# Patient Record
Sex: Female | Born: 1935 | Race: White | Hispanic: No | Smoking: Former smoker
Health system: Southern US, Community
[De-identification: ages and names within clinical notes are randomized; demographics above are authoritative.]

## PROBLEM LIST (undated history)

## (undated) DIAGNOSIS — I10 Essential (primary) hypertension: Secondary | ICD-10-CM

## (undated) DIAGNOSIS — I4719 Other supraventricular tachycardia: Secondary | ICD-10-CM

## (undated) DIAGNOSIS — C50919 Malignant neoplasm of unspecified site of unspecified female breast: Secondary | ICD-10-CM

## (undated) DIAGNOSIS — Z95828 Presence of other vascular implants and grafts: Secondary | ICD-10-CM

## (undated) DIAGNOSIS — I4891 Unspecified atrial fibrillation: Secondary | ICD-10-CM

## (undated) DIAGNOSIS — M810 Age-related osteoporosis without current pathological fracture: Secondary | ICD-10-CM

## (undated) DIAGNOSIS — I2699 Other pulmonary embolism without acute cor pulmonale: Secondary | ICD-10-CM

## (undated) DIAGNOSIS — G4700 Insomnia, unspecified: Secondary | ICD-10-CM

## (undated) DIAGNOSIS — I471 Supraventricular tachycardia: Secondary | ICD-10-CM

## (undated) DIAGNOSIS — T7840XA Allergy, unspecified, initial encounter: Secondary | ICD-10-CM

## (undated) HISTORY — DX: Allergy, unspecified, initial encounter: T78.40XA

## (undated) HISTORY — DX: Other supraventricular tachycardia: I47.19

## (undated) HISTORY — PX: REPLACEMENT TOTAL KNEE: SUR1224

## (undated) HISTORY — DX: Essential (primary) hypertension: I10

## (undated) HISTORY — PX: CHOLECYSTECTOMY: SHX55

## (undated) HISTORY — PX: HERNIA REPAIR: SHX51

## (undated) HISTORY — PX: TONSILLECTOMY: SUR1361

## (undated) HISTORY — DX: Presence of other vascular implants and grafts: Z95.828

## (undated) HISTORY — DX: Other pulmonary embolism without acute cor pulmonale: I26.99

## (undated) HISTORY — DX: Insomnia, unspecified: G47.00

## (undated) HISTORY — DX: Supraventricular tachycardia: I47.1

## (undated) HISTORY — PX: BLADDER SURGERY: SHX569

## (undated) HISTORY — DX: Age-related osteoporosis without current pathological fracture: M81.0

---

## 2007-07-28 DIAGNOSIS — I2699 Other pulmonary embolism without acute cor pulmonale: Secondary | ICD-10-CM

## 2007-07-28 DIAGNOSIS — Z95828 Presence of other vascular implants and grafts: Secondary | ICD-10-CM

## 2007-07-28 HISTORY — DX: Other pulmonary embolism without acute cor pulmonale: I26.99

## 2007-07-28 HISTORY — DX: Presence of other vascular implants and grafts: Z95.828

## 2008-05-19 LAB — HM COLONOSCOPY: HM Colonoscopy: NORMAL

## 2009-07-23 LAB — PROTIME-INR

## 2010-05-13 LAB — PROTIME-INR

## 2011-03-04 ENCOUNTER — Ambulatory Visit: Payer: Self-pay | Admitting: Internal Medicine

## 2011-03-10 LAB — PROTIME-INR

## 2011-03-17 LAB — PROTIME-INR

## 2011-03-19 ENCOUNTER — Observation Stay: Payer: Self-pay | Admitting: Internal Medicine

## 2011-04-01 LAB — PROTIME-INR

## 2011-04-30 LAB — PROTIME-INR

## 2011-05-13 ENCOUNTER — Ambulatory Visit (INDEPENDENT_AMBULATORY_CARE_PROVIDER_SITE_OTHER): Payer: Medicare Other | Admitting: Internal Medicine

## 2011-05-13 ENCOUNTER — Encounter: Payer: Self-pay | Admitting: Internal Medicine

## 2011-05-13 DIAGNOSIS — G4700 Insomnia, unspecified: Secondary | ICD-10-CM

## 2011-05-13 DIAGNOSIS — I10 Essential (primary) hypertension: Secondary | ICD-10-CM

## 2011-05-13 DIAGNOSIS — Z7901 Long term (current) use of anticoagulants: Secondary | ICD-10-CM | POA: Insufficient documentation

## 2011-05-13 LAB — PROTIME-INR
INR: 2.4 ratio — ABNORMAL HIGH (ref 0.8–1.0)
Prothrombin Time: 26.3 s — ABNORMAL HIGH (ref 10.2–12.4)

## 2011-05-13 MED ORDER — TRAZODONE HCL 50 MG PO TABS: 50.0000 mg | ORAL_TABLET | Freq: Every day | ORAL | Status: DC

## 2011-05-13 NOTE — Progress Notes (Addendum)
Subjective:    Patient ID: [name], female    DOB: [birth year], 77 y.o.   MRN: [id number]  HPI 77YO female who presents to establish care. Her primary concern today is insomnia. She has been having trouble both falling asleep and staying asleep for years.  She notes that in the past she used Ambien with resolution of her insomnia. She was however concerned about drowsiness associated with using this medication and she had one fall associated with drowsiness which led to blood clot in her leg. She subsequently stopped her Ambien and has been using Benadryl with minimal improvement. She was recently hospitalized for pneumonia and was started on trazodone by the hospitalist. She has not yet filled this prescription. She again notes difficulty both falling asleep and staying asleep. She reports good sleep hygiene. She has tried limiting caffeine, making sure her sleep space is quiet and comfortable with no improvement.  Chronic insomnia has led to daytime somnolence.  In regards to her history of DVT and pulmonary embolus she reports full compliance with her Coumadin. She brings record of her last INR which was slightly low at 1.6. She reports that she is due for an INR rechecked today. She notes that she typically has her on an are checked at the [facility] where she lives. She denies any bleeding or bruising.  Outpatient Encounter Prescriptions as of 05/13/2011  Medication Sig Dispense Refill  . bisoprolol-hydrochlorothiazide (ZIAC) 5-6.25 MG per tablet Take 1 tablet by mouth daily.        . diphenhydrAMINE (BENADRYL) 25 MG tablet Take 25 mg by mouth every 6 (six) hours as needed.        . montelukast (SINGULAIR) 10 MG tablet Take 10 mg by mouth as needed.        . Pseudoeph-Doxylamine-DM-APAP (NYQUIL PO) Take by mouth. 2 teaspoons prn        . traZODone (DESYREL) 50 MG tablet Take 1 tablet (50 mg total) by mouth at bedtime.  30 tablet  3  . warfarin (COUMADIN) 3 MG tablet Take 3 mg by mouth daily.  1 tablet Monday, Wednesday, Thursday, Friday and Saturday.  11/2 on Tuesday and Sundays         Review of Systems  Constitutional: Negative for fever, chills, appetite change, fatigue and unexpected weight change.  HENT: Negative for ear pain, congestion, sore throat, trouble swallowing, neck pain, voice change and sinus pressure.   Eyes: Negative for visual disturbance.  Respiratory: Negative for cough, shortness of breath, wheezing and stridor.   Cardiovascular: Negative for chest pain, palpitations and leg swelling.  Gastrointestinal: Negative for nausea, vomiting, abdominal pain, diarrhea, constipation, blood in stool, abdominal distention and anal bleeding.  Genitourinary: Negative for dysuria and flank pain.  Musculoskeletal: Negative for myalgias, arthralgias and gait problem.  Skin: Negative for color change and rash.  Neurological: Negative for dizziness and headaches.  Hematological: Negative for adenopathy. Does not bruise/bleed easily.  Psychiatric/Behavioral: Positive for sleep disturbance. Negative for suicidal ideas and dysphoric mood. The patient is not nervous/anxious.    BP 144/90  Pulse 68  Temp(Src) 97.8 F (36.6 C) (Oral)  Resp 12  Ht 5' (1.524 m)  Wt 151 lb (68.493 kg)  BMI 29.49 kg/m2  SpO2 97%     Objective:   Physical Exam  Constitutional: She is oriented to person, place, and time. She appears well-developed and well-nourished. No distress.  HENT:  Head: Normocephalic and atraumatic.  Right Ear: External ear normal.  Left Ear: External ear  normal.  Nose: Nose normal.  Mouth/Throat: Oropharynx is clear and moist. No oropharyngeal exudate.  Eyes: Conjunctivae are normal. Pupils are equal, round, and reactive to light. Right eye exhibits no discharge. Left eye exhibits no discharge. No scleral icterus.  Neck: Normal range of motion. Neck supple. No tracheal deviation present. No thyromegaly present.  Cardiovascular: Normal rate, regular rhythm, normal  heart sounds and intact distal pulses.  Exam reveals no gallop and no friction rub.   No murmur heard. Pulmonary/Chest: Effort normal and breath sounds normal. No respiratory distress. She has no wheezes. She has no rales. She exhibits no tenderness.  Abdominal: Soft. Bowel sounds are normal. She exhibits no distension and no mass. There is no tenderness. There is no rebound and no guarding.  Musculoskeletal: Normal range of motion. She exhibits no edema and no tenderness.  Lymphadenopathy:    She has no cervical adenopathy.  Neurological: She is alert and oriented to person, place, and time. No cranial nerve deficit. She exhibits normal muscle tone. Coordination normal.  Skin: Skin is warm and dry. No rash noted. She is not diaphoretic. No erythema. No pallor.  Psychiatric: She has a normal mood and affect. Her behavior is normal. Judgment and thought content normal.          Assessment & Plan:  1. Insomnia - encouraged her to try trazodone. She already has a prescription for this. She will try this for one month and then return to clinic. She will call sooner if any difficulty with this medication.  2. IVC filter -patient with a history of an IVC filter placed after being found to have extensive DVT and pulmonary emboli. We will request records on her previous treatment. Given that this IVC filter has been in place for 3 years question if it may need to be removed. We will set her up with vascular surgery for ultrasound and evaluation. She will continue on Coumadin. Previous Coumadin level in September 2012 was low. Will check INR today. Goal INR 2-3.  3. Hypertension -  blood pressure slightly elevated today. Patient reports it is typically lower at home. We'll continue to monitor. Will continue current medications. She will followup in one month.

## 2011-05-26 ENCOUNTER — Telehealth: Payer: Self-pay | Admitting: Internal Medicine

## 2011-05-26 NOTE — Telephone Encounter (Signed)
She needs to be seen by the physician [facility] first, then they can decide what imaging is best for her. She has a history of an IVC filter and I just question if it can be left in or if it needs removed.

## 2011-05-26 NOTE — Telephone Encounter (Signed)
I called and advised patient of what Dr. Dan Humphreys sent back to me.  She said that she will go tomorrow afternoon she understood what Dr. Dan Humphreys was saying she just thought that Dr. Dan Humphreys had ordered the ultra sound.

## 2011-05-26 NOTE — Telephone Encounter (Signed)
patient thought she was suppose to have an ultra sound at [facility] they state they need an order she is scheduled on 05/27/11 at 2:45.  They told her that she was only going to see a doctor there at [facility], and no ultra sound scheduled.  She states she will be out of her apartment for a few hours we can leave message on machine.

## 2011-06-08 ENCOUNTER — Other Ambulatory Visit: Payer: Self-pay | Admitting: *Deleted

## 2011-06-08 MED ORDER — BISOPROLOL-HYDROCHLOROTHIAZIDE 5-6.25 MG PO TABS: 1.0000 | ORAL_TABLET | Freq: Every day | ORAL | Status: DC

## 2011-06-08 MED ORDER — WARFARIN SODIUM 3 MG PO TABS: 3.0000 mg | ORAL_TABLET | Freq: Every day | ORAL | Status: DC

## 2011-06-10 LAB — PROTIME-INR

## 2011-06-11 ENCOUNTER — Telehealth: Payer: Self-pay | Admitting: Internal Medicine

## 2011-06-11 MED ORDER — WARFARIN SODIUM 3 MG PO TABS: 3.0000 mg | ORAL_TABLET | Freq: Every day | ORAL | Status: DC

## 2011-06-11 MED ORDER — BISOPROLOL-HYDROCHLOROTHIAZIDE 5-6.25 MG PO TABS: 1.0000 | ORAL_TABLET | Freq: Every day | ORAL | Status: DC

## 2011-06-11 NOTE — Telephone Encounter (Signed)
This was done on 11/12. Patient informed, Resent RX

## 2011-06-11 NOTE — Telephone Encounter (Signed)
[phone] Pt called she needs refill on meds.  She went to [facility] and they told her we needed to see refill request for [place]  ziac -hctc 5-6.25mg  once daily Coumadin  3mg  tablet  1  tablet 5 days a week   1 1/2 tablet 2 days week    The pt only has 5 days left of this rx  Please let pt know when this has been called in

## 2011-06-12 ENCOUNTER — Telehealth: Payer: Self-pay | Admitting: Internal Medicine

## 2011-06-12 DIAGNOSIS — Z7901 Long term (current) use of anticoagulants: Secondary | ICD-10-CM

## 2011-06-12 NOTE — Telephone Encounter (Signed)
Send the order for a PT/INR to be done in 1 month to [facility] the Nurse AT the [facility] ext. [phone].

## 2011-06-12 NOTE — Telephone Encounter (Signed)
Left message asking patient to return my call.

## 2011-06-12 NOTE — Telephone Encounter (Signed)
INR was perfect. Repeat INR 1 month.

## 2011-06-15 ENCOUNTER — Telehealth: Payer: Self-pay | Admitting: Internal Medicine

## 2011-06-15 NOTE — Telephone Encounter (Signed)
Called pt to let her know that we have her cd  From [facility] is here for her to pick up. Pt will pick this up at her next appointment

## 2011-06-15 NOTE — Telephone Encounter (Signed)
[facility] [phone]

## 2011-06-29 ENCOUNTER — Ambulatory Visit (INDEPENDENT_AMBULATORY_CARE_PROVIDER_SITE_OTHER): Payer: Medicare Other | Admitting: Internal Medicine

## 2011-06-29 ENCOUNTER — Encounter: Payer: Self-pay | Admitting: Internal Medicine

## 2011-06-29 DIAGNOSIS — G4700 Insomnia, unspecified: Secondary | ICD-10-CM

## 2011-06-29 DIAGNOSIS — J309 Allergic rhinitis, unspecified: Secondary | ICD-10-CM

## 2011-06-29 MED ORDER — TRAZODONE HCL 50 MG PO TABS: 50.0000 mg | ORAL_TABLET | Freq: Every day | ORAL | Status: DC

## 2011-06-29 MED ORDER — FLUTICASONE PROPIONATE 50 MCG/ACT NA SUSP: 1.0000 | Freq: Every day | NASAL | Status: DC

## 2011-06-29 MED ORDER — MONTELUKAST SODIUM 10 MG PO TABS: 10.0000 mg | ORAL_TABLET | ORAL | Status: DC | PRN

## 2011-06-29 NOTE — Progress Notes (Signed)
Subjective:    Patient ID: [name], female    DOB: Dec 15, 1925, 77 y.o.   MRN: [id number]  HPI 77 year old female with a history of hypertension presents for followup. At her last visit, she discussed a concern of chronic insomnia. She had previously been treated with Ambien with good control of her symptoms. However, she had several episodes while taking Ambien where she became confused. She stopped taking his medication several months ago. Her previous primary care physician wrote her prescription for tramadol but she did not start this medication. She reports that she is typically going to sleep around 12:30 or 1 AM. She sleeps for 2-4 hours at a maximum and then is up again. She denies any anxiety or depression playing a role in her insomnia. She is interested in trying another medication but is concerned about the potential for addiction. She has used Benadryl in the past with no improvement.  She is also concerned today about recent episodes of sneezing and runny nose. She first noticed this when she moved into [place]. She has taken over-the-counter antihistamines with no improvement. She denies any fever or chills. She denies any sinus pressure. She reports her nasal drainage is clear. She denies any cough or shortness of breath.  Outpatient Encounter Prescriptions as of 06/29/2011  Medication Sig Dispense Refill  . bisoprolol-hydrochlorothiazide (ZIAC) 5-6.25 MG per tablet Take 1 tablet by mouth daily.  90 tablet  1  . diphenhydrAMINE (BENADRYL) 25 MG tablet Take 25 mg by mouth every 6 (six) hours as needed.        . montelukast (SINGULAIR) 10 MG tablet Take 1 tablet (10 mg total) by mouth as needed.  90 tablet  1  . Pseudoeph-Doxylamine-DM-APAP (NYQUIL PO) Take by mouth. 2 teaspoons prn        . warfarin (COUMADIN) 3 MG tablet Take 1 tablet (3 mg total) by mouth daily. 1 tablet Monday, Wednesday, Thursday, Friday and Saturday.  11/2 on Tuesday and Sundays  100 tablet  2    Review  of Systems  Constitutional: Negative for fever, chills, appetite change, fatigue and unexpected weight change.  HENT: Negative for ear pain, congestion, sore throat, neck pain and sinus pressure.   Eyes: Negative for visual disturbance.  Respiratory: Negative for cough, shortness of breath, wheezing and stridor.   Cardiovascular: Positive for leg swelling (left upper foot). Negative for chest pain and palpitations.  Gastrointestinal: Negative for abdominal pain.  Genitourinary: Negative for dysuria and flank pain.  Musculoskeletal: Negative for myalgias, arthralgias and gait problem.  Skin: Negative for color change and rash.  Neurological: Negative for dizziness and headaches.  Hematological: Negative for adenopathy. Does not bruise/bleed easily.  Psychiatric/Behavioral: Positive for sleep disturbance. Negative for suicidal ideas and dysphoric mood. The patient is not nervous/anxious.    BP 140/60  Pulse 70  Temp(Src) 98 F (36.7 C) (Oral)  Wt 153 lb (69.4 kg)  SpO2 97%     Objective:   Physical Exam  Constitutional: She is oriented to person, place, and time. She appears well-developed and well-nourished. No distress.  HENT:  Head: Normocephalic and atraumatic.  Right Ear: External ear normal.  Left Ear: External ear normal.  Nose: Nose normal.  Mouth/Throat: Oropharynx is clear and moist. No oropharyngeal exudate.  Eyes: Conjunctivae are normal. Pupils are equal, round, and reactive to light. Right eye exhibits no discharge. Left eye exhibits no discharge. No scleral icterus.  Neck: Normal range of motion. Neck supple. No tracheal deviation present. No thyromegaly present.  Cardiovascular: Normal rate, regular rhythm, normal heart sounds and intact distal pulses.  Exam reveals no gallop and no friction rub.   No murmur heard. Pulmonary/Chest: Effort normal and breath sounds normal. No respiratory distress. She has no wheezes. She has no rales. She exhibits no tenderness.    Musculoskeletal: Normal range of motion. She exhibits no edema and no tenderness.  Lymphadenopathy:    She has no cervical adenopathy.  Neurological: She is alert and oriented to person, place, and time. No cranial nerve deficit. She exhibits normal muscle tone. Coordination normal.  Skin: Skin is warm and dry. No rash noted. She is not diaphoretic. No erythema. No pallor.  Psychiatric: She has a normal mood and affect. Her behavior is normal. Judgment and thought content normal.          Assessment & Plan:  1. Insomnia -will try a course of tramadol starting with 50 mg at bedtime. Encouraged good sleep hygiene, including avoiding caffeinated beverages or foods. Encouraged her to remain active during the day getting as much physical activity as possible.She will followup in one month.  2. Allergic rhinitis - will try a course of nasal steroids. She will continue to use over-the-counter nonsedating antihistamines. She will followup in one month.

## 2011-07-07 ENCOUNTER — Telehealth: Payer: Self-pay | Admitting: *Deleted

## 2011-07-07 NOTE — Telephone Encounter (Signed)
Insurance requires PA on singulair - pharm faxed info which states that patient's must try and fail inhaled corticosteroid OR combination inhaled corticosteroid long acting beta agonist first. Do you want to proceed with PA on singulair?

## 2011-07-07 NOTE — Telephone Encounter (Signed)
We should try inhaled Advair 100/50 first.

## 2011-07-08 MED ORDER — FLUTICASONE-SALMETEROL 100-50 MCG/DOSE IN AEPB: 1.0000 | INHALATION_SPRAY | Freq: Two times a day (BID) | RESPIRATORY_TRACT | Status: DC

## 2011-07-08 NOTE — Telephone Encounter (Signed)
Patient returned call and was notified. Rx for advair sent to pharmacy .

## 2011-07-08 NOTE — Telephone Encounter (Signed)
Left mess to call office back.   

## 2011-07-09 LAB — PROTIME-INR

## 2011-07-15 ENCOUNTER — Telehealth: Payer: Self-pay | Admitting: Internal Medicine

## 2011-07-15 NOTE — Telephone Encounter (Signed)
Patient notified

## 2011-07-15 NOTE — Telephone Encounter (Signed)
INR perfect. Repeat 1 month.

## 2011-07-28 HISTORY — PX: BREAST SURGERY: SHX581

## 2011-07-28 HISTORY — PX: MASTECTOMY: SHX3

## 2011-08-06 DIAGNOSIS — I2699 Other pulmonary embolism without acute cor pulmonale: Secondary | ICD-10-CM | POA: Diagnosis not present

## 2011-08-06 LAB — PROTIME-INR

## 2011-09-10 DIAGNOSIS — I2699 Other pulmonary embolism without acute cor pulmonale: Secondary | ICD-10-CM | POA: Diagnosis not present

## 2011-09-10 LAB — PROTIME-INR

## 2011-09-11 ENCOUNTER — Telehealth: Payer: Self-pay | Admitting: Internal Medicine

## 2011-09-11 NOTE — Telephone Encounter (Signed)
Called and spoke with Boyd Kerbs, [employer] fund[employer]. INR was perfect at 2.5, repeat in 1 month.

## 2011-09-28 ENCOUNTER — Ambulatory Visit: Payer: Medicare Other | Admitting: Internal Medicine

## 2011-10-07 DIAGNOSIS — I2699 Other pulmonary embolism without acute cor pulmonale: Secondary | ICD-10-CM | POA: Diagnosis not present

## 2011-10-07 LAB — PROTIME-INR

## 2011-10-09 ENCOUNTER — Telehealth: Payer: Self-pay | Admitting: Internal Medicine

## 2011-10-09 NOTE — Telephone Encounter (Signed)
INR perfect at 2.5. Repeat 1 month.

## 2011-10-13 ENCOUNTER — Encounter: Payer: Self-pay | Admitting: Internal Medicine

## 2011-10-13 NOTE — Telephone Encounter (Signed)
Patient notified

## 2011-11-09 DIAGNOSIS — I2699 Other pulmonary embolism without acute cor pulmonale: Secondary | ICD-10-CM | POA: Diagnosis not present

## 2011-11-09 LAB — PROTIME-INR

## 2011-11-10 ENCOUNTER — Telehealth: Payer: Self-pay | Admitting: Internal Medicine

## 2011-11-10 NOTE — Telephone Encounter (Signed)
Left detailed VM for pt.

## 2011-11-10 NOTE — Telephone Encounter (Signed)
INR was perfect. Repeat in 1 month.

## 2011-11-18 ENCOUNTER — Encounter: Payer: Self-pay | Admitting: Internal Medicine

## 2011-11-27 ENCOUNTER — Telehealth: Payer: Self-pay | Admitting: Internal Medicine

## 2011-11-27 DIAGNOSIS — R197 Diarrhea, unspecified: Secondary | ICD-10-CM

## 2011-11-27 MED ORDER — DIPHENOXYLATE-ATROPINE 2.5-0.025 MG PO TABS: 1.0000 | ORAL_TABLET | Freq: Four times a day (QID) | ORAL | Status: AC | PRN

## 2011-11-27 NOTE — Telephone Encounter (Signed)
[name] 11/27/2011 8:15 AM Signed  Patient feels that she her coumadin is causing her diarrhea and she would like to know if you can call her in some Lamotil she has taken this in the past. Please call patient and advise.  Please advise on Lomotil request/SLS

## 2011-11-27 NOTE — Telephone Encounter (Signed)
Done

## 2011-11-27 NOTE — Telephone Encounter (Signed)
ORder entered for phone in.  If symptoms persist, then needs to be seen.

## 2011-11-27 NOTE — Telephone Encounter (Signed)
Patient informed/SLS  

## 2011-11-27 NOTE — Telephone Encounter (Signed)
Patient feels that she her coumadin is causing her diarrhea and she would like to know if you can call her in some Lamotil she has taken this in the past.  Please call patient and advise.

## 2011-12-01 DIAGNOSIS — IMO0002 Reserved for concepts with insufficient information to code with codable children: Secondary | ICD-10-CM | POA: Diagnosis not present

## 2011-12-01 DIAGNOSIS — N390 Urinary tract infection, site not specified: Secondary | ICD-10-CM | POA: Diagnosis not present

## 2011-12-03 ENCOUNTER — Encounter: Payer: Self-pay | Admitting: Internal Medicine

## 2011-12-03 ENCOUNTER — Ambulatory Visit (INDEPENDENT_AMBULATORY_CARE_PROVIDER_SITE_OTHER): Payer: Medicare Other | Admitting: Internal Medicine

## 2011-12-03 VITALS — BP 128/82 | HR 74 | Temp 98.0°F | Ht 60.0 in | Wt 154.5 lb

## 2011-12-03 DIAGNOSIS — L989 Disorder of the skin and subcutaneous tissue, unspecified: Secondary | ICD-10-CM | POA: Insufficient documentation

## 2011-12-03 DIAGNOSIS — Z7901 Long term (current) use of anticoagulants: Secondary | ICD-10-CM

## 2011-12-03 DIAGNOSIS — R197 Diarrhea, unspecified: Secondary | ICD-10-CM | POA: Diagnosis not present

## 2011-12-03 DIAGNOSIS — N390 Urinary tract infection, site not specified: Secondary | ICD-10-CM | POA: Diagnosis not present

## 2011-12-03 DIAGNOSIS — I10 Essential (primary) hypertension: Secondary | ICD-10-CM | POA: Diagnosis not present

## 2011-12-03 DIAGNOSIS — Z86711 Personal history of pulmonary embolism: Secondary | ICD-10-CM | POA: Insufficient documentation

## 2011-12-03 LAB — COMPREHENSIVE METABOLIC PANEL
ALT: 16 U/L (ref 0–35)
AST: 20 U/L (ref 0–37)
Albumin: 4 g/dL (ref 3.5–5.2)
Alkaline Phosphatase: 55 U/L (ref 39–117)
BUN: 13 mg/dL (ref 6–23)
CO2: 28 mEq/L (ref 19–32)
Calcium: 9.5 mg/dL (ref 8.4–10.5)
Chloride: 103 mEq/L (ref 96–112)
Creatinine, Ser: 1 mg/dL (ref 0.4–1.2)
GFR: 56.78 mL/min — ABNORMAL LOW (ref >60.00)
Glucose, Bld: 97 mg/dL (ref 70–99)
Potassium: 3.3 mEq/L — ABNORMAL LOW (ref 3.5–5.1)
Sodium: 142 mEq/L (ref 135–145)
Total Bilirubin: 0.7 mg/dL (ref 0.3–1.2)
Total Protein: 7.4 g/dL (ref 6.0–8.3)

## 2011-12-03 LAB — PROTIME-INR
INR: 2.3 ratio — ABNORMAL HIGH (ref 0.8–1.0)
Prothrombin Time: 25.7 s — ABNORMAL HIGH (ref 10.2–12.4)

## 2011-12-03 LAB — POCT URINALYSIS DIPSTICK
Glucose, UA: 100
Leukocytes, UA: NEGATIVE
Nitrite, UA: POSITIVE
Protein, UA: 30
Spec Grav, UA: 1.02
Urobilinogen, UA: 2
pH, UA: 5

## 2011-12-03 MED ORDER — BISOPROLOL-HYDROCHLOROTHIAZIDE 5-6.25 MG PO TABS: 1.0000 | ORAL_TABLET | Freq: Every day | ORAL | Status: DC

## 2011-12-03 MED ORDER — WARFARIN SODIUM 3 MG PO TABS: 3.0000 mg | ORAL_TABLET | Freq: Every day | ORAL | Status: DC

## 2011-12-03 NOTE — Progress Notes (Signed)
Subjective:    Patient ID: [name], female    DOB: 1926/06/16, 78 y.o.   MRN: [id number]  HPI 78 year old female with history of pulmonary embolus status post IVC filter, on chronic anticoagulation with Coumadin, presents for followup. She reports that earlier this week, on Monday evening she developed dysuria and left flank pain. She went to urgent care on Tuesday morning and was diagnosed with urinary tract infection and treated with Cipro. She reports that her symptoms have improved. She has not had any further dysuria, fever, chills. She does continue to have some mild left flank pain. She is concerned about the effect of the Cipro on her Coumadin level. She held the dose of Coumadin last night.  She also reports a couple week history of watery, yellow stools. She is concerned that this may be related to her use of Coumadin. She is concerned that Coumadin may be damaging the inside of her bowels. She denies any blood in her stools. She denies any abdominal pain. Her diarrhea has improved and she reports normal bowel movement today. She denies any fever or chills.  She is also concerned today about a dry area on her skin below her nose. She reports that this was diagnosed as a precancerous lesion by a dermatologist in the past and was burned off. This was several years ago. She reports that this area has been particularly dry recently. She is concerned about damage to her skin from the cryotherapy in the past.  She also reports continued watery drainage from her nose. She reports minimal improvement in her symptoms with the use of Flonase or Benadryl. She attributes this to allergies. She has not had any sinus pressure, headache, ear pain, cough.  Outpatient Encounter Prescriptions as of 12/03/2011  Medication Sig Dispense Refill  . bisoprolol-hydrochlorothiazide (ZIAC) 5-6.25 MG per tablet Take 1 tablet by mouth daily.  90 tablet  1  . ciprofloxacin (CIPRO) 250 MG tablet Take 250 mg by mouth  2 (two) times daily.      . diphenhydrAMINE (BENADRYL) 25 MG tablet Take 25 mg by mouth every 6 (six) hours as needed.        . diphenoxylate-atropine (LOMOTIL) 2.5-0.025 MG per tablet Take 1 tablet by mouth 4 (four) times daily as needed for diarrhea or loose stools.  30 tablet  0  . fluticasone (FLONASE) 50 MCG/ACT nasal spray Place 1 spray into the nose daily.  16 g  2  . Fluticasone-Salmeterol (ADVAIR DISKUS) 100-50 MCG/DOSE AEPB Inhale 1 puff into the lungs 2 (two) times daily.  1 each  0  . Pseudoeph-Doxylamine-DM-APAP (NYQUIL PO) Take by mouth. 2 teaspoons prn        . warfarin (COUMADIN) 3 MG tablet Take 1 tablet (3 mg total) by mouth daily. 1 tablet Monday, Wednesday, Thursday, Friday and Saturday.  11/2 on Tuesday and Sundays  100 tablet  2  . DISCONTD: bisoprolol-hydrochlorothiazide (ZIAC) 5-6.25 MG per tablet Take 1 tablet by mouth daily.  90 tablet  1  . DISCONTD: warfarin (COUMADIN) 3 MG tablet Take 1 tablet (3 mg total) by mouth daily. 1 tablet Monday, Wednesday, Thursday, Friday and Saturday.  11/2 on Tuesday and Sundays  100 tablet  2  . DISCONTD: montelukast (SINGULAIR) 10 MG tablet Take 1 tablet (10 mg total) by mouth as needed.  90 tablet  1    Review of Systems  Constitutional: Negative for fever, chills, appetite change, fatigue and unexpected weight change.  HENT: Positive for rhinorrhea and sneezing.  Negative for ear pain, congestion, sore throat, trouble swallowing, neck pain, voice change and sinus pressure.   Eyes: Negative for visual disturbance.  Respiratory: Negative for cough, shortness of breath, wheezing and stridor.   Cardiovascular: Negative for chest pain, palpitations and leg swelling.  Gastrointestinal: Positive for diarrhea. Negative for nausea, vomiting, abdominal pain, constipation, blood in stool, abdominal distention and anal bleeding.  Genitourinary: Positive for dysuria, urgency, frequency and flank pain.  Musculoskeletal: Negative for myalgias,  arthralgias and gait problem.  Skin: Positive for rash. Negative for color change.  Neurological: Negative for dizziness and headaches.  Hematological: Negative for adenopathy. Does not bruise/bleed easily.  Psychiatric/Behavioral: Negative for suicidal ideas, sleep disturbance and dysphoric mood. The patient is not nervous/anxious.    BP 128/82  Pulse 74  Temp(Src) 98 F (36.7 C) (Oral)  Ht 5' (1.524 m)  Wt 154 lb 8 oz (70.081 kg)  BMI 30.17 kg/m2     Objective:   Physical Exam  Constitutional: She is oriented to person, place, and time. She appears well-developed and well-nourished. No distress.  HENT:  Head: Normocephalic and atraumatic.  Right Ear: External ear normal.  Left Ear: External ear normal.  Nose: Nose normal.  Mouth/Throat: Oropharynx is clear and moist. No oropharyngeal exudate.  Eyes: Conjunctivae are normal. Pupils are equal, round, and reactive to light. Right eye exhibits no discharge. Left eye exhibits no discharge. No scleral icterus.  Neck: Normal range of motion. Neck supple. No tracheal deviation present. No thyromegaly present.  Cardiovascular: Normal rate, regular rhythm, normal heart sounds and intact distal pulses.  Exam reveals no gallop and no friction rub.   No murmur heard. Pulmonary/Chest: Effort normal and breath sounds normal. No respiratory distress. She has no wheezes. She has no rales. She exhibits no tenderness.  Musculoskeletal: Normal range of motion. She exhibits no edema and no tenderness.  Lymphadenopathy:    She has no cervical adenopathy.  Neurological: She is alert and oriented to person, place, and time. No cranial nerve deficit. She exhibits normal muscle tone. Coordination normal.  Skin: Skin is warm and dry. Rash (Dry, scaling area just below right nare) noted. She is not diaphoretic. No erythema. No pallor.  Psychiatric: She has a normal mood and affect. Her behavior is normal. Judgment and thought content normal.            Assessment & Plan:

## 2011-12-03 NOTE — Assessment & Plan Note (Signed)
Patient has been treated for urinary tract infection with Cipro. Will recent urine culture from our clinic today. We'll try to obtain urine culture which was performed on Tuesday at urgent care. If symptoms of left flank pain persists, would favor getting CT of the abdomen to look for nephrolithiasis.

## 2011-12-03 NOTE — Assessment & Plan Note (Signed)
Symptoms seem to have improved. Suspect related to viral infection. Will continue to monitor. If symptoms recur, would favor getting stool culture.

## 2011-12-03 NOTE — Assessment & Plan Note (Signed)
Will check INR with labs today. Goal INR between 2 and 3. 

## 2011-12-03 NOTE — Assessment & Plan Note (Signed)
Dry area below right nare appears to be irritation from chronic nasal drainage. Encouraged patient to apply moisturizing cream in this area. Offered referral to dermatology for further evaluation. Patient would like to look into this at a later date.

## 2011-12-05 LAB — URINE CULTURE
Colony Count: NO GROWTH
Organism ID, Bacteria: NO GROWTH

## 2011-12-10 ENCOUNTER — Ambulatory Visit: Payer: Medicare Other | Admitting: Internal Medicine

## 2011-12-15 ENCOUNTER — Telehealth: Payer: Self-pay | Admitting: Internal Medicine

## 2011-12-15 NOTE — Telephone Encounter (Signed)
Patient has been taking antibiotics and she is having burning and itching in her vaginal area and would like something for this. Leave a message for the patient after it is called in.

## 2011-12-15 NOTE — Telephone Encounter (Signed)
May call in Diflucan 150mg  po x 1

## 2011-12-16 MED ORDER — FLUCONAZOLE 150 MG PO TABS: 150.0000 mg | ORAL_TABLET | Freq: Once | ORAL | Status: AC

## 2011-12-16 NOTE — Telephone Encounter (Signed)
Done; patient informed/SLS

## 2012-01-01 DIAGNOSIS — I2699 Other pulmonary embolism without acute cor pulmonale: Secondary | ICD-10-CM | POA: Diagnosis not present

## 2012-01-01 LAB — PROTIME-INR

## 2012-01-04 ENCOUNTER — Telehealth: Payer: Self-pay | Admitting: Internal Medicine

## 2012-01-04 DIAGNOSIS — I872 Venous insufficiency (chronic) (peripheral): Secondary | ICD-10-CM | POA: Diagnosis not present

## 2012-01-04 DIAGNOSIS — M1990 Unspecified osteoarthritis, unspecified site: Secondary | ICD-10-CM | POA: Diagnosis not present

## 2012-01-04 DIAGNOSIS — M79609 Pain in unspecified limb: Secondary | ICD-10-CM | POA: Diagnosis not present

## 2012-01-04 DIAGNOSIS — M7989 Other specified soft tissue disorders: Secondary | ICD-10-CM | POA: Diagnosis not present

## 2012-01-04 NOTE — Telephone Encounter (Signed)
Patient notified. She was recently on Diflucan. Also patient will decrease to 2.5 daily and will repeat in 1 week. She has an appt on Friday to see Dr. Dan Humphreys and will have pt/inr checked then.

## 2012-01-04 NOTE — Telephone Encounter (Signed)
Coumadin level was high at 3.9. Has patient been taking any new medications, esp antibiotics? Please have her hold coumadin tonight. If current dose is 3mg  (as listed in chart) will have her decrease to 2.5mg  daily. Repeat INR 1 week.

## 2012-01-08 ENCOUNTER — Ambulatory Visit (INDEPENDENT_AMBULATORY_CARE_PROVIDER_SITE_OTHER): Payer: Medicare Other | Admitting: Internal Medicine

## 2012-01-08 ENCOUNTER — Encounter: Payer: Self-pay | Admitting: Internal Medicine

## 2012-01-08 VITALS — BP 140/90 | HR 66 | Temp 98.7°F | Ht 61.0 in | Wt 156.2 lb

## 2012-01-08 DIAGNOSIS — Z7901 Long term (current) use of anticoagulants: Secondary | ICD-10-CM | POA: Diagnosis not present

## 2012-01-08 DIAGNOSIS — G4700 Insomnia, unspecified: Secondary | ICD-10-CM

## 2012-01-08 DIAGNOSIS — F329 Major depressive disorder, single episode, unspecified: Secondary | ICD-10-CM | POA: Diagnosis not present

## 2012-01-08 DIAGNOSIS — K529 Noninfective gastroenteritis and colitis, unspecified: Secondary | ICD-10-CM | POA: Insufficient documentation

## 2012-01-08 DIAGNOSIS — R197 Diarrhea, unspecified: Secondary | ICD-10-CM | POA: Diagnosis not present

## 2012-01-08 LAB — PROTIME-INR
INR: 2.5 ratio — ABNORMAL HIGH (ref 0.8–1.0)
Prothrombin Time: 27.5 s — ABNORMAL HIGH (ref 10.2–12.4)

## 2012-01-08 MED ORDER — WARFARIN SODIUM 2.5 MG PO TABS: 2.5000 mg | ORAL_TABLET | Freq: Every day | ORAL | Status: DC

## 2012-01-08 MED ORDER — DIPHENOXYLATE-ATROPINE 2.5-0.025 MG PO TABS: 1.0000 | ORAL_TABLET | Freq: Four times a day (QID) | ORAL | Status: AC | PRN

## 2012-01-08 MED ORDER — TRAZODONE HCL 50 MG PO TABS: 25.0000 mg | ORAL_TABLET | Freq: Every evening | ORAL | Status: DC | PRN

## 2012-01-08 NOTE — Assessment & Plan Note (Signed)
Recent INR was elevated at 3.9. INR 2-3. Will repeat INR today.

## 2012-01-08 NOTE — Progress Notes (Signed)
Subjective:    Patient ID: [name], female    DOB: [birth year], 78 y.o.   MRN: [id number]  HPI 78 year old female with history of pulmonary embolus status post IVC filter on chronic anticoagulation presents for followup. Note that recent INR was elevated at 3.9. This in the setting of use of Diflucan. We'll plan to repeat INR today. She has not had any bleeding or bruising.  She is concerned today about persistent insomnia. She reports that she typically does not fall asleep until after 2 AM and then wakes at 7:30. She is not active during the day. In the past, she used Ambien to help with sleep with improvement, but she would prefer to avoid this medication because of drowsiness after waking.  She is also concerned today about depression. She notes that she should have "never moved into assisted living "and wishes that she had stayed in her home. She describes frustration that seeing a residence in her facility using canes or walkers. This reminds her of her own mortality. She reports feeling little support from her family.  Outpatient Encounter Prescriptions as of 01/08/2012  Medication Sig Dispense Refill  . bisoprolol-hydrochlorothiazide (ZIAC) 5-6.25 MG per tablet Take 1 tablet by mouth daily.  90 tablet  1  . ciprofloxacin (CIPRO) 250 MG tablet Take 250 mg by mouth 2 (two) times daily.      . diphenhydrAMINE (BENADRYL) 25 MG tablet Take 25 mg by mouth every 6 (six) hours as needed.        . fluticasone (FLONASE) 50 MCG/ACT nasal spray Place 1 spray into the nose daily.  16 g  2  . Fluticasone-Salmeterol (ADVAIR DISKUS) 100-50 MCG/DOSE AEPB Inhale 1 puff into the lungs 2 (two) times daily.  1 each  0  . Pseudoeph-Doxylamine-DM-APAP (NYQUIL PO) Take by mouth. 2 teaspoons prn        . DISCONTD: warfarin (COUMADIN) 3 MG tablet Take 1 tablet (3 mg total) by mouth daily. 1 tablet Monday, Wednesday, Thursday, Friday and Saturday.  11/2 on Tuesday and Sundays  100 tablet  2  . DISCONTD:  warfarin (COUMADIN) 3 MG tablet Take 2.5 mg by mouth daily.      . diphenoxylate-atropine (LOMOTIL) 2.5-0.025 MG per tablet Take 1 tablet by mouth 4 (four) times daily as needed for diarrhea or loose stools.  30 tablet  5  . traZODone (DESYREL) 50 MG tablet Take 0.5-1 tablets (25-50 mg total) by mouth at bedtime as needed for sleep.  30 tablet  3  . warfarin (COUMADIN) 2.5 MG tablet Take 1 tablet (2.5 mg total) by mouth daily.  30 tablet  3    Review of Systems  Constitutional: Negative for fever, chills, appetite change, fatigue and unexpected weight change.  HENT: Negative for neck pain.   Eyes: Negative for visual disturbance.  Respiratory: Negative for cough, shortness of breath, wheezing and stridor.   Cardiovascular: Negative for chest pain, palpitations and leg swelling.  Gastrointestinal: Negative for nausea, vomiting, abdominal pain, diarrhea, constipation, blood in stool, abdominal distention and anal bleeding.  Genitourinary: Negative for dysuria and flank pain.  Musculoskeletal: Positive for myalgias, joint swelling and arthralgias. Negative for gait problem.  Skin: Negative for color change and rash.  Neurological: Negative for dizziness and headaches.  Hematological: Negative for adenopathy. Does not bruise/bleed easily.  Psychiatric/Behavioral: Positive for disturbed wake/sleep cycle and dysphoric mood. Negative for suicidal ideas. The patient is not nervous/anxious.    BP 140/90  Pulse 66  Temp 98.7 F (37.1 C) (  Oral)  Ht 5\' 1"  (1.549 m)  Wt 156 lb 4 oz (70.875 kg)  BMI 29.52 kg/m2  SpO2 98%     Objective:   Physical Exam  Constitutional: She is oriented to person, place, and time. She appears well-developed and well-nourished. No distress.  HENT:  Head: Normocephalic and atraumatic.  Right Ear: External ear normal.  Left Ear: External ear normal.  Nose: Nose normal.  Mouth/Throat: Oropharynx is clear and moist. No oropharyngeal exudate.  Eyes: Conjunctivae are  normal. Pupils are equal, round, and reactive to light. Right eye exhibits no discharge. Left eye exhibits no discharge. No scleral icterus.  Neck: Normal range of motion. Neck supple. No tracheal deviation present. No thyromegaly present.  Cardiovascular: Normal rate, regular rhythm, normal heart sounds and intact distal pulses.  Exam reveals no gallop and no friction rub.   No murmur heard. Pulmonary/Chest: Effort normal and breath sounds normal. No respiratory distress. She has no wheezes. She has no rales. She exhibits no tenderness.  Musculoskeletal: Normal range of motion. She exhibits no edema and no tenderness.  Lymphadenopathy:    She has no cervical adenopathy.  Neurological: She is alert and oriented to person, place, and time. No cranial nerve deficit. She exhibits normal muscle tone. Coordination normal.  Skin: Skin is warm and dry. No rash noted. She is not diaphoretic. No erythema. No pallor.  Psychiatric: Her speech is normal and behavior is normal. Judgment and thought content normal. Cognition and memory are normal. She exhibits a depressed mood.          Assessment & Plan:

## 2012-01-08 NOTE — Assessment & Plan Note (Signed)
Significant insomnia. Has used Ambien in the past but prefers to avoid this medication. Will try trazodone 50 mg daily. Followup one month.

## 2012-01-08 NOTE — Assessment & Plan Note (Signed)
Symptoms currently controlled with Lomotil. Will continue.

## 2012-01-08 NOTE — Assessment & Plan Note (Signed)
Symptoms recently worsening. Offered support today. Patient is not interested in counseling. She is not interested in starting medications for depression. However, she is willing to start trazodone to help with sleep at night. She will followup in one month.

## 2012-01-18 DIAGNOSIS — I2699 Other pulmonary embolism without acute cor pulmonale: Secondary | ICD-10-CM | POA: Diagnosis not present

## 2012-01-18 LAB — PROTIME-INR

## 2012-01-19 ENCOUNTER — Telehealth: Payer: Self-pay | Admitting: Internal Medicine

## 2012-01-19 ENCOUNTER — Encounter: Payer: Self-pay | Admitting: Internal Medicine

## 2012-01-19 NOTE — Telephone Encounter (Signed)
Patient advised as instructed via telephone.  She will repeat again in one week.

## 2012-01-19 NOTE — Telephone Encounter (Signed)
INR low at 1.7. Can you have her increase coumadin to 3mg  daily and repeat INR 1 week

## 2012-01-26 ENCOUNTER — Telehealth: Payer: Self-pay | Admitting: Internal Medicine

## 2012-01-26 NOTE — Telephone Encounter (Signed)
PT/INR results from 01/18/2012 given to Dr. Dan Humphreys for review.

## 2012-01-26 NOTE — Telephone Encounter (Signed)
Patient advised as instructed via telephone, patient wanted to know what dose to be on of Coumadin, advised patient that we didn't have a copy of labs in her chart.  Called [place] at the [facility] [place] and she will fax a copy of labs to our office.

## 2012-01-26 NOTE — Telephone Encounter (Signed)
Please have her increase trazodone to 100mg  at bedtime (previous dose was 50mg  at bedtime).

## 2012-01-26 NOTE — Telephone Encounter (Signed)
Ms rhinehart called  The last time she saw dr walker in June  Dr walker put her on trazone to help her sleep.  Pt stated this is not helping her at all.  She stated it was like taking an aspirn. She is not even getting sleepy with taking this sleep. Please advise pt what she needs to do.  She stated she has been taking zquil over the counter this is not helping  [employer]  Pt would like her last coumdiam  Results she had this done last week.  She would like to know what dosage of meds she needs to take

## 2012-01-26 NOTE — Telephone Encounter (Signed)
INR slightly low at 1.7. Please have her increase back to coumadin 3mg  daily and repeat INR 2 weeks.

## 2012-01-27 NOTE — Telephone Encounter (Signed)
Patient advised as instructed via telephone. 

## 2012-02-02 ENCOUNTER — Encounter: Payer: Self-pay | Admitting: Internal Medicine

## 2012-02-03 ENCOUNTER — Encounter: Payer: Self-pay | Admitting: Internal Medicine

## 2012-02-08 ENCOUNTER — Ambulatory Visit (INDEPENDENT_AMBULATORY_CARE_PROVIDER_SITE_OTHER): Payer: Medicare Other | Admitting: Internal Medicine

## 2012-02-08 ENCOUNTER — Encounter: Payer: Self-pay | Admitting: Internal Medicine

## 2012-02-08 VITALS — BP 110/80 | HR 60 | Temp 97.9°F | Ht 61.0 in | Wt 155.2 lb

## 2012-02-08 DIAGNOSIS — Z7901 Long term (current) use of anticoagulants: Secondary | ICD-10-CM

## 2012-02-08 DIAGNOSIS — R197 Diarrhea, unspecified: Secondary | ICD-10-CM

## 2012-02-08 DIAGNOSIS — G4700 Insomnia, unspecified: Secondary | ICD-10-CM | POA: Diagnosis not present

## 2012-02-08 DIAGNOSIS — F329 Major depressive disorder, single episode, unspecified: Secondary | ICD-10-CM | POA: Diagnosis not present

## 2012-02-08 DIAGNOSIS — K529 Noninfective gastroenteritis and colitis, unspecified: Secondary | ICD-10-CM

## 2012-02-08 MED ORDER — DIPHENOXYLATE-ATROPINE 2.5-0.025 MG PO TABS: 1.0000 | ORAL_TABLET | Freq: Four times a day (QID) | ORAL | Status: DC | PRN

## 2012-02-08 MED ORDER — MIRTAZAPINE 15 MG PO TABS: 15.0000 mg | ORAL_TABLET | Freq: Every day | ORAL | Status: DC

## 2012-02-08 MED ORDER — WARFARIN SODIUM 3 MG PO TABS: 3.0000 mg | ORAL_TABLET | Freq: Every day | ORAL | Status: DC

## 2012-02-08 NOTE — Assessment & Plan Note (Signed)
Patient was unable to tolerate trazodone. Will try changing to Remeron to help address both issues of sleep and depression. Followup one month.

## 2012-02-08 NOTE — Assessment & Plan Note (Signed)
Symptoms of depressed mood are persistent. Will add Remeron to see if any improvement.

## 2012-02-08 NOTE — Assessment & Plan Note (Signed)
Goal INR 2-3. Will repeat INR today.

## 2012-02-08 NOTE — Progress Notes (Signed)
Subjective:    Patient ID: [name], female    DOB: 27-Aug-1925, 78 y.o.   MRN: [id number]  HPI 78 year old female with history of hypertension, pulmonary embolus on chronic anticoagulation, chronic diarrhea, depression, and insomnia presents for followup. In regards to her insomnia, we recently tried using trazodone to help with symptoms. She reports severe nasal congestion on this medication and was unable to tolerate it. She is currently using Benadryl liquid before bed with minimal improvement. She has difficulty falling and staying asleep.  She continues to complain of depressed mood. She reports feeling stressed about selling her old home. She would like to try medication to help with her mood.    Outpatient Encounter Prescriptions as of 02/08/2012  Medication Sig Dispense Refill  . bisoprolol-hydrochlorothiazide (ZIAC) 5-6.25 MG per tablet Take 1 tablet by mouth daily.  90 tablet  1  . ciprofloxacin (CIPRO) 250 MG tablet Take 250 mg by mouth 2 (two) times daily.      . fluticasone (FLONASE) 50 MCG/ACT nasal spray Place 1 spray into the nose daily.  16 g  2  . Fluticasone-Salmeterol (ADVAIR DISKUS) 100-50 MCG/DOSE AEPB Inhale 1 puff into the lungs 2 (two) times daily.  1 each  0  . Pseudoeph-Doxylamine-DM-APAP (NYQUIL PO) Take by mouth. 2 teaspoons prn        . warfarin (COUMADIN) 3 MG tablet Take 1 tablet (3 mg total) by mouth daily.  30 tablet  3  . DISCONTD: diphenhydrAMINE (BENADRYL) 25 MG tablet Take 25 mg by mouth every 6 (six) hours as needed.        Marland Kitchen DISCONTD: warfarin (COUMADIN) 2.5 MG tablet Take 1 tablet (2.5 mg total) by mouth daily.  30 tablet  3  . diphenoxylate-atropine (LOMOTIL) 2.5-0.025 MG per tablet Take 1 tablet by mouth 4 (four) times daily as needed for diarrhea or loose stools.  30 tablet  0  . mirtazapine (REMERON) 15 MG tablet Take 1 tablet (15 mg total) by mouth at bedtime.  30 tablet  3    Review of Systems  Constitutional: Negative for fever, chills,  appetite change, fatigue and unexpected weight change.  HENT: Negative for ear pain, congestion, sore throat, trouble swallowing, neck pain, voice change and sinus pressure.   Eyes: Negative for visual disturbance.  Respiratory: Negative for cough, shortness of breath, wheezing and stridor.   Cardiovascular: Negative for chest pain, palpitations and leg swelling.  Gastrointestinal: Positive for diarrhea. Negative for nausea, vomiting, abdominal pain, constipation, blood in stool, abdominal distention and anal bleeding.  Genitourinary: Negative for dysuria and flank pain.  Musculoskeletal: Negative for myalgias, arthralgias and gait problem.  Skin: Negative for color change and rash.  Neurological: Negative for dizziness and headaches.  Hematological: Negative for adenopathy. Does not bruise/bleed easily.  Psychiatric/Behavioral: Positive for disturbed wake/sleep cycle and dysphoric mood. Negative for suicidal ideas. The patient is not nervous/anxious.    BP 110/80  Pulse 60  Temp 97.9 F (36.6 C) (Oral)  Ht 5\' 1"  (1.549 m)  Wt 155 lb 4 oz (70.421 kg)  BMI 29.33 kg/m2  SpO2 96%     Objective:   Physical Exam  Constitutional: She is oriented to person, place, and time. She appears well-developed and well-nourished. No distress.  HENT:  Head: Normocephalic and atraumatic.  Right Ear: External ear normal.  Left Ear: External ear normal.  Nose: Nose normal.  Mouth/Throat: Oropharynx is clear and moist. No oropharyngeal exudate.  Eyes: Conjunctivae are normal. Pupils are equal, round, and reactive  to light. Right eye exhibits no discharge. Left eye exhibits no discharge. No scleral icterus.  Neck: Normal range of motion. Neck supple. No tracheal deviation present. No thyromegaly present.  Cardiovascular: Normal rate, regular rhythm, normal heart sounds and intact distal pulses.  Exam reveals no gallop and no friction rub.   No murmur heard. Pulmonary/Chest: Effort normal and breath  sounds normal. No respiratory distress. She has no wheezes. She has no rales. She exhibits no tenderness.  Musculoskeletal: Normal range of motion. She exhibits no edema and no tenderness.  Lymphadenopathy:    She has no cervical adenopathy.  Neurological: She is alert and oriented to person, place, and time. No cranial nerve deficit. She exhibits normal muscle tone. Coordination normal.  Skin: Skin is warm and dry. No rash noted. She is not diaphoretic. No erythema. No pallor.  Psychiatric: She has a normal mood and affect. Her behavior is normal. Judgment and thought content normal.          Assessment & Plan:

## 2012-02-09 LAB — PROTIME-INR
INR: 2.21 — ABNORMAL HIGH (ref <1.50)
Prothrombin Time: 25.3 seconds — ABNORMAL HIGH (ref 11.6–15.2)

## 2012-02-10 DIAGNOSIS — H2510 Age-related nuclear cataract, unspecified eye: Secondary | ICD-10-CM | POA: Diagnosis not present

## 2012-02-10 DIAGNOSIS — Z961 Presence of intraocular lens: Secondary | ICD-10-CM | POA: Diagnosis not present

## 2012-02-20 DIAGNOSIS — J019 Acute sinusitis, unspecified: Secondary | ICD-10-CM | POA: Diagnosis not present

## 2012-02-20 DIAGNOSIS — R05 Cough: Secondary | ICD-10-CM | POA: Diagnosis not present

## 2012-02-24 ENCOUNTER — Other Ambulatory Visit: Payer: Self-pay | Admitting: Internal Medicine

## 2012-02-24 MED ORDER — FLUCONAZOLE 150 MG PO TABS: 150.0000 mg | ORAL_TABLET | Freq: Once | ORAL | Status: DC

## 2012-02-24 NOTE — Telephone Encounter (Signed)
We can call her Diflucan 150mg  po x 1

## 2012-02-24 NOTE — Telephone Encounter (Signed)
Rx called to [facility], patient notified via telephone.

## 2012-02-24 NOTE — Telephone Encounter (Signed)
Saturday pt went to urgent care for sinus infection.  They gave her ceftin and this has caused a yeast infection Can you call her something in for this [employer]

## 2012-02-26 DIAGNOSIS — I2699 Other pulmonary embolism without acute cor pulmonale: Secondary | ICD-10-CM | POA: Diagnosis not present

## 2012-02-26 LAB — PROTIME-INR

## 2012-03-03 ENCOUNTER — Telehealth: Payer: Self-pay | Admitting: Internal Medicine

## 2012-03-03 NOTE — Telephone Encounter (Signed)
INR perfect. Repeat 1 month. 

## 2012-03-03 NOTE — Telephone Encounter (Signed)
Labs given to Dr. Dan Humphreys for review.

## 2012-03-03 NOTE — Telephone Encounter (Signed)
Called [facility] and they will fax a copy of PT/INR results from 02/26/2012.

## 2012-03-03 NOTE — Telephone Encounter (Signed)
Patient advised as instructed via telephone. 

## 2012-03-03 NOTE — Telephone Encounter (Signed)
Ms [name] called to get her pt/inr results she had this done [place]26/12  [place] advise [place] of results

## 2012-03-09 ENCOUNTER — Encounter: Payer: Self-pay | Admitting: Internal Medicine

## 2012-03-26 ENCOUNTER — Other Ambulatory Visit: Payer: Self-pay | Admitting: Internal Medicine

## 2012-03-31 DIAGNOSIS — I2699 Other pulmonary embolism without acute cor pulmonale: Secondary | ICD-10-CM | POA: Diagnosis not present

## 2012-03-31 LAB — PROTIME-INR

## 2012-04-01 ENCOUNTER — Telehealth: Payer: Self-pay | Admitting: Internal Medicine

## 2012-04-01 NOTE — Telephone Encounter (Signed)
Left message on machine at home for patient to return call. 

## 2012-04-01 NOTE — Telephone Encounter (Signed)
Coumadin level was normal with INR 2.5. Continue coumadin current dose, repeat INR 1 month.

## 2012-04-01 NOTE — Telephone Encounter (Signed)
Patient advised as instructed via telephone. 

## 2012-04-03 DIAGNOSIS — H6120 Impacted cerumen, unspecified ear: Secondary | ICD-10-CM | POA: Diagnosis not present

## 2012-04-03 DIAGNOSIS — H9190 Unspecified hearing loss, unspecified ear: Secondary | ICD-10-CM | POA: Diagnosis not present

## 2012-04-07 DIAGNOSIS — H9190 Unspecified hearing loss, unspecified ear: Secondary | ICD-10-CM | POA: Diagnosis not present

## 2012-04-07 DIAGNOSIS — H6120 Impacted cerumen, unspecified ear: Secondary | ICD-10-CM | POA: Diagnosis not present

## 2012-04-11 ENCOUNTER — Encounter: Payer: Self-pay | Admitting: Internal Medicine

## 2012-04-22 ENCOUNTER — Telehealth: Payer: Self-pay | Admitting: Internal Medicine

## 2012-04-22 ENCOUNTER — Ambulatory Visit: Payer: Self-pay | Admitting: Internal Medicine

## 2012-04-22 DIAGNOSIS — N6459 Other signs and symptoms in breast: Secondary | ICD-10-CM | POA: Diagnosis not present

## 2012-04-22 DIAGNOSIS — Z1231 Encounter for screening mammogram for malignant neoplasm of breast: Secondary | ICD-10-CM | POA: Diagnosis not present

## 2012-04-22 NOTE — Telephone Encounter (Signed)
Mammogram was abnormal with asymmetry in left breast. They requested additional views. Has this been set up with patient?

## 2012-04-26 ENCOUNTER — Ambulatory Visit: Payer: Self-pay | Admitting: Internal Medicine

## 2012-04-26 DIAGNOSIS — R920 Mammographic microcalcification found on diagnostic imaging of breast: Secondary | ICD-10-CM | POA: Diagnosis not present

## 2012-04-26 DIAGNOSIS — N6459 Other signs and symptoms in breast: Secondary | ICD-10-CM | POA: Diagnosis not present

## 2012-04-26 DIAGNOSIS — N63 Unspecified lump in unspecified breast: Secondary | ICD-10-CM | POA: Diagnosis not present

## 2012-04-27 ENCOUNTER — Other Ambulatory Visit: Payer: Self-pay | Admitting: *Deleted

## 2012-04-27 DIAGNOSIS — Z7901 Long term (current) use of anticoagulants: Secondary | ICD-10-CM

## 2012-04-27 MED ORDER — BISOPROLOL-HYDROCHLOROTHIAZIDE 5-6.25 MG PO TABS: 1.0000 | ORAL_TABLET | Freq: Every day | ORAL | Status: DC

## 2012-04-27 MED ORDER — WARFARIN SODIUM 3 MG PO TABS: 3.0000 mg | ORAL_TABLET | Freq: Every day | ORAL | Status: DC

## 2012-04-27 NOTE — Telephone Encounter (Signed)
Spoke with patient and she stated that the additional views were done yesterday along with an ultrasound.

## 2012-04-28 ENCOUNTER — Telehealth: Payer: Self-pay | Admitting: Internal Medicine

## 2012-04-28 DIAGNOSIS — R928 Other abnormal and inconclusive findings on diagnostic imaging of breast: Secondary | ICD-10-CM

## 2012-04-28 NOTE — Telephone Encounter (Signed)
Mammogram and US of the left breast showed microcalcifications and small nodular area. They recommended surgical referral for evaluation and possible biopsy. Has this been set up?

## 2012-04-29 DIAGNOSIS — I2699 Other pulmonary embolism without acute cor pulmonale: Secondary | ICD-10-CM | POA: Diagnosis not present

## 2012-04-29 LAB — PROTIME-INR: INR: 2 — AB (ref 0.9–1.1)

## 2012-05-02 DIAGNOSIS — D485 Neoplasm of uncertain behavior of skin: Secondary | ICD-10-CM | POA: Diagnosis not present

## 2012-05-02 DIAGNOSIS — Z23 Encounter for immunization: Secondary | ICD-10-CM | POA: Diagnosis not present

## 2012-05-02 DIAGNOSIS — C44319 Basal cell carcinoma of skin of other parts of face: Secondary | ICD-10-CM | POA: Diagnosis not present

## 2012-05-02 DIAGNOSIS — L821 Other seborrheic keratosis: Secondary | ICD-10-CM | POA: Diagnosis not present

## 2012-05-02 NOTE — Telephone Encounter (Signed)
Patient advised as instructed via telephone, she will wait from [place] to call her with surgical referral.

## 2012-05-02 NOTE — Telephone Encounter (Signed)
Left message on machine at home for patient to return call. 

## 2012-05-03 ENCOUNTER — Encounter: Payer: Self-pay | Admitting: Internal Medicine

## 2012-05-04 ENCOUNTER — Telehealth: Payer: Self-pay | Admitting: Internal Medicine

## 2012-05-04 NOTE — Telephone Encounter (Signed)
Called patient on home number, line was busy will call back later.

## 2012-05-04 NOTE — Telephone Encounter (Signed)
Patient is needing her PT/INR results. Please fax to the nurse at [facility]  [phone].

## 2012-05-05 ENCOUNTER — Telehealth: Payer: Self-pay | Admitting: Internal Medicine

## 2012-05-05 NOTE — Telephone Encounter (Signed)
Patient advised as instructed via telephone. 

## 2012-05-05 NOTE — Telephone Encounter (Signed)
Coumadin level was perfect, INR 2. Continue current dose. Repeat 1 month.

## 2012-05-09 NOTE — Telephone Encounter (Signed)
PT/INR results faxed to [facility] at [phone].

## 2012-05-18 DIAGNOSIS — C50119 Malignant neoplasm of central portion of unspecified female breast: Secondary | ICD-10-CM | POA: Diagnosis not present

## 2012-05-18 DIAGNOSIS — N63 Unspecified lump in unspecified breast: Secondary | ICD-10-CM | POA: Diagnosis not present

## 2012-05-23 DIAGNOSIS — C50919 Malignant neoplasm of unspecified site of unspecified female breast: Secondary | ICD-10-CM | POA: Diagnosis not present

## 2012-05-24 ENCOUNTER — Encounter: Payer: Self-pay | Admitting: Internal Medicine

## 2012-05-30 DIAGNOSIS — I2699 Other pulmonary embolism without acute cor pulmonale: Secondary | ICD-10-CM | POA: Diagnosis not present

## 2012-05-30 LAB — PROTIME-INR: INR: 2.7 — AB (ref 0.9–1.1)

## 2012-05-31 ENCOUNTER — Telehealth: Payer: Self-pay | Admitting: Internal Medicine

## 2012-05-31 NOTE — Telephone Encounter (Signed)
We can put her in an available slot either this week or next week.

## 2012-05-31 NOTE — Telephone Encounter (Signed)
Pt aware of appointment 

## 2012-05-31 NOTE — Telephone Encounter (Signed)
Scheduled patient an appt with Dr. Dan Humphreys on 06/02/2012 at 9:00, called patient at home number got no answer or machine.  I will try again later.

## 2012-05-31 NOTE — Telephone Encounter (Signed)
Pt called she wanted to get an appointment with dr walker within the next 3 days ? On meds,  Pt [facility] an [facility] to have a cancer spot taken off top of lip and after that she will be have cancer surgery on her breast

## 2012-06-01 ENCOUNTER — Telehealth: Payer: Self-pay | Admitting: Internal Medicine

## 2012-06-01 NOTE — Telephone Encounter (Signed)
INR perfect, 2.7. Continue coumadin same dose, repeat INR 1 month.

## 2012-06-02 ENCOUNTER — Ambulatory Visit (INDEPENDENT_AMBULATORY_CARE_PROVIDER_SITE_OTHER): Payer: Medicare Other | Admitting: Internal Medicine

## 2012-06-02 ENCOUNTER — Encounter: Payer: Self-pay | Admitting: Internal Medicine

## 2012-06-02 VITALS — BP 112/70 | HR 73 | Temp 98.7°F | Ht 61.0 in | Wt 156.2 lb

## 2012-06-02 DIAGNOSIS — R197 Diarrhea, unspecified: Secondary | ICD-10-CM

## 2012-06-02 DIAGNOSIS — G4700 Insomnia, unspecified: Secondary | ICD-10-CM | POA: Diagnosis not present

## 2012-06-02 DIAGNOSIS — Z7901 Long term (current) use of anticoagulants: Secondary | ICD-10-CM

## 2012-06-02 DIAGNOSIS — C50919 Malignant neoplasm of unspecified site of unspecified female breast: Secondary | ICD-10-CM | POA: Diagnosis not present

## 2012-06-02 DIAGNOSIS — K529 Noninfective gastroenteritis and colitis, unspecified: Secondary | ICD-10-CM

## 2012-06-02 DIAGNOSIS — Z853 Personal history of malignant neoplasm of breast: Secondary | ICD-10-CM | POA: Insufficient documentation

## 2012-06-02 DIAGNOSIS — L989 Disorder of the skin and subcutaneous tissue, unspecified: Secondary | ICD-10-CM

## 2012-06-02 MED ORDER — WARFARIN SODIUM 3 MG PO TABS: 3.0000 mg | ORAL_TABLET | Freq: Every day | ORAL | Status: DC

## 2012-06-02 MED ORDER — DIPHENOXYLATE-ATROPINE 2.5-0.025 MG PO TABS: 1.0000 | ORAL_TABLET | Freq: Four times a day (QID) | ORAL | Status: AC | PRN

## 2012-06-02 MED ORDER — BISOPROLOL-HYDROCHLOROTHIAZIDE 5-6.25 MG PO TABS: 1.0000 | ORAL_TABLET | Freq: Every day | ORAL | Status: DC

## 2012-06-02 MED ORDER — ALPRAZOLAM 0.25 MG PO TABS: 0.2500 mg | ORAL_TABLET | Freq: Two times a day (BID) | ORAL | Status: DC | PRN

## 2012-06-02 NOTE — Assessment & Plan Note (Signed)
Patient was diagnosed with skin cancer of the face below right nostril. Will try to obtain records on biopsy results. Patient is scheduled for resection at [facility] next week.

## 2012-06-02 NOTE — Assessment & Plan Note (Signed)
Will try to obtain records on previous pulmonary embolus and recent for lifelong anticoagulation therapy. Recent INR was therapeutic at 2.7. Continue current Coumadin dose. Followup one month.

## 2012-06-02 NOTE — Assessment & Plan Note (Signed)
Patient reports significant worsening of symptoms of insomnia with ongoing medical issues. She was not able to tolerate trazodone, Ambien, or Benadryl to help with sleep. Will try using low dose of Xanax. Followup one month.

## 2012-06-02 NOTE — Progress Notes (Signed)
Subjective:    Patient ID: [name], female    DOB: 01-14-1926, 78 y.o.   MRN: [id number]  HPI 78 year old female with history of hypertension, chronic diarrhea, and recent diagnosis of breast cancer presents for followup. In the interim since her last visit, she had mammogram which showed asymmetry within the left breast. She ultimately had additional imaging with mammogram and ultrasound which confirmed nodular lesion. This was biopsied by general surgery and found to be infiltrating ductal carcinoma. She has met with a general surgeon and has discussed the option of lobectomy with radiation therapy versus mastectomy. She is still weighing the pros and cons of each option.  She was also seen by a dermatologist for evaluation of skin lesion below her right nostril. Biopsy, per her report, showed skin cancer. She is scheduled for resection later this week. Next  She reports significant worsening of insomnia with ongoing medical issues. In the past, she has tried trazodone, Ambien, and Benadryl but has not been able to tolerate these medications.  Outpatient Encounter Prescriptions as of 06/02/2012  Medication Sig Dispense Refill  . bisoprolol-hydrochlorothiazide (ZIAC) 5-6.25 MG per tablet Take 1 tablet by mouth daily.  90 tablet  3  . ciprofloxacin (CIPRO) 250 MG tablet Take 250 mg by mouth 2 (two) times daily.      . fluconazole (DIFLUCAN) 150 MG tablet Take 1 tablet (150 mg total) by mouth once.  1 tablet  0  . fluticasone (FLONASE) 50 MCG/ACT nasal spray Place 1 spray into the nose daily.  16 g  2  . Fluticasone-Salmeterol (ADVAIR DISKUS) 100-50 MCG/DOSE AEPB Inhale 1 puff into the lungs 2 (two) times daily.  1 each  0  . Pseudoeph-Doxylamine-DM-APAP (NYQUIL PO) Take by mouth. 2 teaspoons prn        . warfarin (COUMADIN) 3 MG tablet Take 1 tablet (3 mg total) by mouth daily.  90 tablet  3  . ALPRAZolam (XANAX) 0.25 MG tablet Take 1 tablet (0.25 mg total) by mouth 2 (two) times daily as  needed for sleep.  20 tablet  0  . diphenoxylate-atropine (LOMOTIL) 2.5-0.025 MG per tablet Take 1 tablet by mouth 4 (four) times daily as needed for diarrhea or loose stools.  30 tablet  0  . [DISCONTINUED] mirtazapine (REMERON) 15 MG tablet Take 1 tablet (15 mg total) by mouth at bedtime.  30 tablet  3    Review of Systems  Constitutional: Negative for fever, chills, appetite change, fatigue and unexpected weight change.  HENT: Negative for ear pain, congestion, sore throat, trouble swallowing, neck pain, voice change and sinus pressure.   Eyes: Negative for visual disturbance.  Respiratory: Negative for cough, shortness of breath, wheezing and stridor.   Cardiovascular: Negative for chest pain, palpitations and leg swelling.  Gastrointestinal: Negative for nausea, vomiting, abdominal pain, diarrhea, constipation, blood in stool, abdominal distention and anal bleeding.  Genitourinary: Negative for dysuria and flank pain.  Musculoskeletal: Negative for myalgias, arthralgias and gait problem.  Skin: Negative for color change and rash.  Neurological: Negative for dizziness and headaches.  Hematological: Negative for adenopathy. Does not bruise/bleed easily.  Psychiatric/Behavioral: Positive for sleep disturbance and dysphoric mood. Negative for suicidal ideas. The patient is nervous/anxious.        Objective:   Physical Exam  Constitutional: She is oriented to person, place, and time. She appears well-developed and well-nourished. No distress.  HENT:  Head: Normocephalic and atraumatic.  Right Ear: External ear normal.  Left Ear: External ear normal.  Nose: Nose normal.  Mouth/Throat: Oropharynx is clear and moist. No oropharyngeal exudate.  Eyes: Conjunctivae normal are normal. Pupils are equal, round, and reactive to light. Right eye exhibits no discharge. Left eye exhibits no discharge. No scleral icterus.  Neck: Normal range of motion. Neck supple. No tracheal deviation present. No  thyromegaly present.  Cardiovascular: Normal rate, regular rhythm, normal heart sounds and intact distal pulses.  Exam reveals no gallop and no friction rub.   No murmur heard. Pulmonary/Chest: Effort normal and breath sounds normal. No respiratory distress. She has no wheezes. She has no rales. She exhibits no tenderness.  Musculoskeletal: Normal range of motion. She exhibits no edema and no tenderness.  Lymphadenopathy:    She has no cervical adenopathy.  Neurological: She is alert and oriented to person, place, and time. No cranial nerve deficit. She exhibits normal muscle tone. Coordination normal.  Skin: Skin is warm and dry. No rash noted. She is not diaphoretic. No erythema. No pallor.  Psychiatric: Her speech is normal and behavior is normal. Judgment and thought content normal. Her mood appears anxious. Cognition and memory are normal. She exhibits a depressed mood.          Assessment & Plan:

## 2012-06-02 NOTE — Assessment & Plan Note (Signed)
Patient was recently diagnosed with infiltrating ductal carcinoma of her left breast. She is debating lumpectomy with radiation therapy versus mastectomy. We discussed both of these options today. She will followup with her surgeon later this month. We will try to obtain her [place] to better understand why she has been on lifelong anticoagulation with Coumadin, as she will need to come off anticoagulants prior to surgery. Followup one month.

## 2012-06-03 ENCOUNTER — Encounter: Payer: Self-pay | Admitting: Internal Medicine

## 2012-06-03 NOTE — Telephone Encounter (Signed)
Patient advised via telephone

## 2012-06-07 DIAGNOSIS — L908 Other atrophic disorders of skin: Secondary | ICD-10-CM | POA: Diagnosis not present

## 2012-06-07 DIAGNOSIS — L918 Other hypertrophic disorders of the skin: Secondary | ICD-10-CM | POA: Diagnosis not present

## 2012-06-07 DIAGNOSIS — Z85828 Personal history of other malignant neoplasm of skin: Secondary | ICD-10-CM | POA: Diagnosis not present

## 2012-06-07 DIAGNOSIS — L905 Scar conditions and fibrosis of skin: Secondary | ICD-10-CM | POA: Diagnosis not present

## 2012-06-07 DIAGNOSIS — C4401 Basal cell carcinoma of skin of lip: Secondary | ICD-10-CM | POA: Diagnosis not present

## 2012-06-08 ENCOUNTER — Encounter: Payer: Self-pay | Admitting: Internal Medicine

## 2012-06-09 ENCOUNTER — Other Ambulatory Visit: Payer: Self-pay | Admitting: Internal Medicine

## 2012-06-09 DIAGNOSIS — G4700 Insomnia, unspecified: Secondary | ICD-10-CM

## 2012-06-09 MED ORDER — ALPRAZOLAM 0.25 MG PO TABS: 0.2500 mg | ORAL_TABLET | Freq: Two times a day (BID) | ORAL | Status: DC | PRN

## 2012-06-09 NOTE — Telephone Encounter (Signed)
Pt called to get refill on xanax [employer]

## 2012-06-09 NOTE — Telephone Encounter (Signed)
Rx called to [facility] pharmacy.

## 2012-06-21 ENCOUNTER — Encounter: Payer: Self-pay | Admitting: Internal Medicine

## 2012-06-29 DIAGNOSIS — I2699 Other pulmonary embolism without acute cor pulmonale: Secondary | ICD-10-CM | POA: Diagnosis not present

## 2012-06-29 LAB — PROTIME-INR: INR: 2.5 — AB (ref 0.9–1.1)

## 2012-06-30 ENCOUNTER — Telehealth: Payer: Self-pay | Admitting: Internal Medicine

## 2012-06-30 NOTE — Telephone Encounter (Signed)
Recent Coumadin level was perfect on 12/4 at 2.5.  Continue coumadin same dose. Repeat INR 1 month

## 2012-07-01 NOTE — Telephone Encounter (Signed)
Pt.notified

## 2012-07-04 ENCOUNTER — Ambulatory Visit: Payer: Self-pay | Admitting: General Surgery

## 2012-07-04 DIAGNOSIS — Z01812 Encounter for preprocedural laboratory examination: Secondary | ICD-10-CM | POA: Diagnosis not present

## 2012-07-04 DIAGNOSIS — Z853 Personal history of malignant neoplasm of breast: Secondary | ICD-10-CM | POA: Diagnosis not present

## 2012-07-04 DIAGNOSIS — Z01810 Encounter for preprocedural cardiovascular examination: Secondary | ICD-10-CM | POA: Diagnosis not present

## 2012-07-04 DIAGNOSIS — I119 Hypertensive heart disease without heart failure: Secondary | ICD-10-CM | POA: Diagnosis not present

## 2012-07-04 DIAGNOSIS — Z01818 Encounter for other preprocedural examination: Secondary | ICD-10-CM | POA: Diagnosis not present

## 2012-07-04 DIAGNOSIS — C50919 Malignant neoplasm of unspecified site of unspecified female breast: Secondary | ICD-10-CM | POA: Diagnosis not present

## 2012-07-04 LAB — CBC WITH DIFFERENTIAL/PLATELET
Basophil #: 0.1 10*3/uL (ref 0.0–0.1)
Basophil %: 0.8 %
Eosinophil #: 0.1 10*3/uL (ref 0.0–0.7)
Eosinophil %: 1.8 %
HCT: 40.4 % (ref 35.0–47.0)
HGB: 13.1 g/dL (ref 12.0–16.0)
Lymphocyte #: 2.1 10*3/uL (ref 1.0–3.6)
Lymphocyte %: 27.1 %
MCH: 28.9 pg (ref 26.0–34.0)
MCHC: 32.5 g/dL (ref 32.0–36.0)
MCV: 89 fL (ref 80–100)
Monocyte #: 0.6 x10 3/mm (ref 0.2–0.9)
Monocyte %: 7.6 %
Neutrophil #: 4.9 10*3/uL (ref 1.4–6.5)
Neutrophil %: 62.7 %
Platelet: 276 10*3/uL (ref 150–440)
RBC: 4.54 10*6/uL (ref 3.80–5.20)
RDW: 13.6 % (ref 11.5–14.5)
WBC: 7.8 10*3/uL (ref 3.6–11.0)

## 2012-07-04 LAB — COMPREHENSIVE METABOLIC PANEL
Albumin: 3.6 g/dL (ref 3.4–5.0)
Alkaline Phosphatase: 86 U/L (ref 50–136)
Anion Gap: 6 — ABNORMAL LOW (ref 7–16)
BUN: 10 mg/dL (ref 7–18)
Bilirubin,Total: 0.5 mg/dL (ref 0.2–1.0)
Calcium, Total: 8.9 mg/dL (ref 8.5–10.1)
Chloride: 108 mmol/L — ABNORMAL HIGH (ref 98–107)
Co2: 28 mmol/L (ref 21–32)
Creatinine: 0.79 mg/dL (ref 0.60–1.30)
EGFR (African American): >60
EGFR (Non-African Amer.): >60
Glucose: 83 mg/dL (ref 65–99)
Osmolality: 281 (ref 275–301)
Potassium: 3.7 mmol/L (ref 3.5–5.1)
SGOT(AST): 25 U/L (ref 15–37)
SGPT (ALT): 22 U/L (ref 12–78)
Sodium: 142 mmol/L (ref 136–145)
Total Protein: 7.6 g/dL (ref 6.4–8.2)

## 2012-07-05 LAB — CEA: CEA: 2.1 ng/mL (ref 0.0–4.7)

## 2012-07-05 LAB — CANCER ANTIGEN 27.29: CA 27.29: 42.9 U/mL — ABNORMAL HIGH (ref 0.0–38.6)

## 2012-07-07 ENCOUNTER — Ambulatory Visit (INDEPENDENT_AMBULATORY_CARE_PROVIDER_SITE_OTHER): Payer: Medicare Other | Admitting: Internal Medicine

## 2012-07-07 ENCOUNTER — Encounter: Payer: Self-pay | Admitting: Internal Medicine

## 2012-07-07 VITALS — BP 130/80 | HR 86 | Temp 97.7°F | Resp 15 | Ht 60.0 in | Wt 155.8 lb

## 2012-07-07 DIAGNOSIS — Z7901 Long term (current) use of anticoagulants: Secondary | ICD-10-CM

## 2012-07-07 DIAGNOSIS — H6692 Otitis media, unspecified, left ear: Secondary | ICD-10-CM

## 2012-07-07 DIAGNOSIS — C50919 Malignant neoplasm of unspecified site of unspecified female breast: Secondary | ICD-10-CM

## 2012-07-07 DIAGNOSIS — G4700 Insomnia, unspecified: Secondary | ICD-10-CM | POA: Diagnosis not present

## 2012-07-07 DIAGNOSIS — Z1331 Encounter for screening for depression: Secondary | ICD-10-CM | POA: Diagnosis not present

## 2012-07-07 DIAGNOSIS — H6690 Otitis media, unspecified, unspecified ear: Secondary | ICD-10-CM | POA: Diagnosis not present

## 2012-07-07 MED ORDER — AMOXICILLIN-POT CLAVULANATE 875-125 MG PO TABS: 1.0000 | ORAL_TABLET | Freq: Two times a day (BID) | ORAL | Status: DC

## 2012-07-07 MED ORDER — ALPRAZOLAM 0.25 MG PO TABS: 0.2500 mg | ORAL_TABLET | Freq: Three times a day (TID) | ORAL | Status: DC | PRN

## 2012-07-07 NOTE — Assessment & Plan Note (Signed)
Will request recent notes from surgeon in regards to lab testing and pelvic ultrasound. We discussed that plan for mastectomy would be to bridge Coumadin with Lovenox.

## 2012-07-07 NOTE — Assessment & Plan Note (Signed)
Will need lovenox bridge with upcoming surgery. Will discuss with general surgeon.

## 2012-07-07 NOTE — Assessment & Plan Note (Signed)
Symptoms of anxiety and insomnia are improved with Alprazolam. Will continue.

## 2012-07-07 NOTE — Assessment & Plan Note (Signed)
Symptoms are most consistent with early left otitis media. Given upcoming surgery, will treat with Augmentin twice daily x10 days. Patient will call if symptoms are persistent.

## 2012-07-07 NOTE — Progress Notes (Signed)
Subjective:    Patient ID: [name], female    DOB: 06-18-1926, 78 y.o.   MRN: [id number]  HPI 78 year old female with history of pulmonary embolus on lifelong anticoagulation with Coumadin presents for followup. She was recently diagnosed with left breast cancer. She is scheduled for mastectomy later this month. She notes that she was recently told by a nurse at her surgeon's office that she needed to have lab work performed. She reports that lab work showed possible ovarian cancer. She has now been scheduled for pelvic ultrasound tomorrow. She denies any abdominal pain or distention. She is unclear why these studies needed to be done.  She is also concerned today about left ear pain. She reports her ear has been bothering her for several days. She also notes some nasal congestion with purulent drainage. She denies any fever or chills. She has hearing loss at baseline and wears hearing aids.  She reports that symptoms of anxiety and insomnia are improved with use of alprazolam. She would like to continue this medication.  Outpatient Encounter Prescriptions as of 07/07/2012  Medication Sig Dispense Refill  . ALPRAZolam (XANAX) 0.25 MG tablet Take 1 tablet (0.25 mg total) by mouth 3 (three) times daily as needed for sleep or anxiety.  270 tablet  1  . bisoprolol-hydrochlorothiazide (ZIAC) 5-6.25 MG per tablet Take 1 tablet by mouth daily.  90 tablet  3  . Pseudoeph-Doxylamine-DM-APAP (NYQUIL PO) Take by mouth. 2 teaspoons prn        . warfarin (COUMADIN) 3 MG tablet Take 1 tablet (3 mg total) by mouth daily.  90 tablet  3  . amoxicillin-clavulanate (AUGMENTIN) 875-125 MG per tablet Take 1 tablet by mouth 2 (two) times daily.  20 tablet  0  . fluticasone (FLONASE) 50 MCG/ACT nasal spray Place 1 spray into the nose daily.  16 g  2   BP 130/80  Pulse 86  Temp 97.7 F (36.5 C) (Oral)  Resp 15  Ht 5' (1.524 m)  Wt 155 lb 12 oz (70.648 kg)  BMI 30.42 kg/m2  SpO2 94%  Review of Systems   Constitutional: Negative for fever, chills, appetite change, fatigue and unexpected weight change.  HENT: Positive for hearing loss, ear pain, congestion, postnasal drip and sinus pressure. Negative for sore throat, trouble swallowing, neck pain, voice change and ear discharge.   Eyes: Negative for visual disturbance.  Respiratory: Negative for cough, shortness of breath, wheezing and stridor.   Cardiovascular: Negative for chest pain, palpitations and leg swelling.  Gastrointestinal: Negative for nausea, vomiting, abdominal pain, diarrhea, constipation, blood in stool, abdominal distention and anal bleeding.  Genitourinary: Negative for dysuria and flank pain.  Musculoskeletal: Negative for myalgias, arthralgias and gait problem.  Skin: Negative for color change and rash.  Neurological: Negative for dizziness and headaches.  Hematological: Negative for adenopathy. Does not bruise/bleed easily.  Psychiatric/Behavioral: Negative for suicidal ideas, sleep disturbance and dysphoric mood. The patient is nervous/anxious.        Objective:   Physical Exam  Constitutional: She is oriented to person, place, and time. She appears well-developed and well-nourished. No distress.  HENT:  Head: Normocephalic and atraumatic.  Right Ear: External ear normal. Tympanic membrane is bulging. A middle ear effusion is present.  Left Ear: External ear normal. Tympanic membrane is bulging. A middle ear effusion is present.  Nose: Nose normal.  Mouth/Throat: Oropharynx is clear and moist. No oropharyngeal exudate.  Eyes: Conjunctivae normal are normal. Pupils are equal, round, and reactive to light.  Right eye exhibits no discharge. Left eye exhibits no discharge. No scleral icterus.  Neck: Normal range of motion. Neck supple. No tracheal deviation present. No thyromegaly present.  Cardiovascular: Normal rate, regular rhythm, normal heart sounds and intact distal pulses.  Exam reveals no gallop and no friction  rub.   No murmur heard. Pulmonary/Chest: Effort normal and breath sounds normal. No respiratory distress. She has no wheezes. She has no rales. She exhibits no tenderness.  Musculoskeletal: Normal range of motion. She exhibits no edema and no tenderness.  Lymphadenopathy:    She has no cervical adenopathy.  Neurological: She is alert and oriented to person, place, and time. No cranial nerve deficit. She exhibits normal muscle tone. Coordination normal.  Skin: Skin is warm and dry. No rash noted. She is not diaphoretic. No erythema. No pallor.  Psychiatric: She has a normal mood and affect. Her behavior is normal. Judgment and thought content normal.          Assessment & Plan:

## 2012-07-08 ENCOUNTER — Ambulatory Visit: Payer: Self-pay | Admitting: General Surgery

## 2012-07-08 DIAGNOSIS — R978 Other abnormal tumor markers: Secondary | ICD-10-CM | POA: Diagnosis not present

## 2012-07-08 DIAGNOSIS — R7989 Other specified abnormal findings of blood chemistry: Secondary | ICD-10-CM | POA: Diagnosis not present

## 2012-07-08 DIAGNOSIS — R9389 Abnormal findings on diagnostic imaging of other specified body structures: Secondary | ICD-10-CM | POA: Diagnosis not present

## 2012-07-19 DIAGNOSIS — R6889 Other general symptoms and signs: Secondary | ICD-10-CM | POA: Diagnosis not present

## 2012-07-19 DIAGNOSIS — N840 Polyp of corpus uteri: Secondary | ICD-10-CM | POA: Diagnosis not present

## 2012-07-19 DIAGNOSIS — Z01419 Encounter for gynecological examination (general) (routine) without abnormal findings: Secondary | ICD-10-CM | POA: Diagnosis not present

## 2012-07-19 DIAGNOSIS — N852 Hypertrophy of uterus: Secondary | ICD-10-CM | POA: Diagnosis not present

## 2012-07-19 DIAGNOSIS — R9389 Abnormal findings on diagnostic imaging of other specified body structures: Secondary | ICD-10-CM | POA: Diagnosis not present

## 2012-07-19 LAB — HM MAMMOGRAPHY: HM Mammogram: ABNORMAL

## 2012-07-21 ENCOUNTER — Encounter: Payer: Self-pay | Admitting: Internal Medicine

## 2012-07-22 ENCOUNTER — Ambulatory Visit: Payer: Self-pay | Admitting: General Surgery

## 2012-07-22 DIAGNOSIS — R197 Diarrhea, unspecified: Secondary | ICD-10-CM | POA: Diagnosis not present

## 2012-07-22 DIAGNOSIS — G43909 Migraine, unspecified, not intractable, without status migrainosus: Secondary | ICD-10-CM | POA: Diagnosis not present

## 2012-07-22 DIAGNOSIS — Z86718 Personal history of other venous thrombosis and embolism: Secondary | ICD-10-CM | POA: Diagnosis not present

## 2012-07-22 DIAGNOSIS — C50919 Malignant neoplasm of unspecified site of unspecified female breast: Secondary | ICD-10-CM | POA: Diagnosis not present

## 2012-07-22 DIAGNOSIS — C50419 Malignant neoplasm of upper-outer quadrant of unspecified female breast: Secondary | ICD-10-CM | POA: Diagnosis not present

## 2012-07-22 DIAGNOSIS — N63 Unspecified lump in unspecified breast: Secondary | ICD-10-CM | POA: Diagnosis not present

## 2012-07-22 DIAGNOSIS — Z882 Allergy status to sulfonamides status: Secondary | ICD-10-CM | POA: Diagnosis not present

## 2012-07-22 DIAGNOSIS — Z85828 Personal history of other malignant neoplasm of skin: Secondary | ICD-10-CM | POA: Diagnosis not present

## 2012-07-22 DIAGNOSIS — Z79899 Other long term (current) drug therapy: Secondary | ICD-10-CM | POA: Diagnosis not present

## 2012-07-22 DIAGNOSIS — M129 Arthropathy, unspecified: Secondary | ICD-10-CM | POA: Diagnosis not present

## 2012-07-22 DIAGNOSIS — R42 Dizziness and giddiness: Secondary | ICD-10-CM | POA: Diagnosis not present

## 2012-07-22 DIAGNOSIS — H9190 Unspecified hearing loss, unspecified ear: Secondary | ICD-10-CM | POA: Diagnosis not present

## 2012-07-22 DIAGNOSIS — R609 Edema, unspecified: Secondary | ICD-10-CM | POA: Diagnosis not present

## 2012-07-22 DIAGNOSIS — F329 Major depressive disorder, single episode, unspecified: Secondary | ICD-10-CM | POA: Diagnosis not present

## 2012-07-22 DIAGNOSIS — Z888 Allergy status to other drugs, medicaments and biological substances status: Secondary | ICD-10-CM | POA: Diagnosis not present

## 2012-07-22 DIAGNOSIS — Z7902 Long term (current) use of antithrombotics/antiplatelets: Secondary | ICD-10-CM | POA: Diagnosis not present

## 2012-07-22 DIAGNOSIS — Z881 Allergy status to other antibiotic agents status: Secondary | ICD-10-CM | POA: Diagnosis not present

## 2012-07-22 DIAGNOSIS — I10 Essential (primary) hypertension: Secondary | ICD-10-CM | POA: Diagnosis not present

## 2012-07-22 HISTORY — DX: Malignant neoplasm of unspecified site of unspecified female breast: C50.919

## 2012-07-22 LAB — PROTIME-INR
INR: 1
Prothrombin Time: 13.4 secs (ref 11.5–14.7)

## 2012-07-23 DIAGNOSIS — R42 Dizziness and giddiness: Secondary | ICD-10-CM | POA: Diagnosis not present

## 2012-07-23 DIAGNOSIS — Z85828 Personal history of other malignant neoplasm of skin: Secondary | ICD-10-CM | POA: Diagnosis not present

## 2012-07-23 DIAGNOSIS — G43909 Migraine, unspecified, not intractable, without status migrainosus: Secondary | ICD-10-CM | POA: Diagnosis not present

## 2012-07-23 DIAGNOSIS — H9190 Unspecified hearing loss, unspecified ear: Secondary | ICD-10-CM | POA: Diagnosis not present

## 2012-07-23 DIAGNOSIS — F329 Major depressive disorder, single episode, unspecified: Secondary | ICD-10-CM | POA: Diagnosis not present

## 2012-07-23 DIAGNOSIS — C50419 Malignant neoplasm of upper-outer quadrant of unspecified female breast: Secondary | ICD-10-CM | POA: Diagnosis not present

## 2012-07-25 ENCOUNTER — Telehealth: Payer: Self-pay | Admitting: General Practice

## 2012-07-25 NOTE — Telephone Encounter (Signed)
Pt had LT breast removed. Pt states that she is doing as well as can be under the circumstances. Pt has a follow up with Surgeon in a few days to check dressings and wound. Pt has a follow up with Dr. Dan Humphreys on 08/23/12.

## 2012-07-29 DIAGNOSIS — I2699 Other pulmonary embolism without acute cor pulmonale: Secondary | ICD-10-CM | POA: Diagnosis not present

## 2012-08-01 DIAGNOSIS — I2699 Other pulmonary embolism without acute cor pulmonale: Secondary | ICD-10-CM | POA: Diagnosis not present

## 2012-08-04 ENCOUNTER — Telehealth: Payer: Self-pay | Admitting: *Deleted

## 2012-08-04 NOTE — Telephone Encounter (Signed)
[name] advised as instructed via telephone.

## 2012-08-04 NOTE — Telephone Encounter (Signed)
Lab results from 08/01/2012 INR 1.6 PT 16.7 Boyd Kerbs stated that patient has been on 3mg  daily since 07/23/2012.  She wants to know if you would like for patient to continue on the same dose?  She will have patient call to schedule f/u appt.

## 2012-08-04 NOTE — Telephone Encounter (Signed)
Let's have her increase to 4mg  daily and repeat INR next week on Monday if possible.

## 2012-08-05 LAB — PATHOLOGY REPORT

## 2012-08-08 DIAGNOSIS — I2699 Other pulmonary embolism without acute cor pulmonale: Secondary | ICD-10-CM | POA: Diagnosis not present

## 2012-08-08 LAB — PROTIME-INR: INR: 3.9 — AB (ref 0.9–1.1)

## 2012-08-09 ENCOUNTER — Telehealth: Payer: Self-pay | Admitting: Internal Medicine

## 2012-08-09 DIAGNOSIS — Z7901 Long term (current) use of anticoagulants: Secondary | ICD-10-CM

## 2012-08-09 NOTE — Telephone Encounter (Signed)
Left message on machine at home for patient to return call. 

## 2012-08-09 NOTE — Telephone Encounter (Signed)
INR was elevated at 3.9 on 08/08/12. Goal is 2-3. Did Dr. Lemar Livings adjust the coumadin dose?  If not, what is the current coumadin dose she is taking?

## 2012-08-10 MED ORDER — WARFARIN SODIUM 3 MG PO TABS: 3.0000 mg | ORAL_TABLET | Freq: Every day | ORAL | Status: DC

## 2012-08-10 MED ORDER — BISOPROLOL-HYDROCHLOROTHIAZIDE 5-6.25 MG PO TABS: 1.0000 | ORAL_TABLET | Freq: Every day | ORAL | Status: DC

## 2012-08-10 NOTE — Telephone Encounter (Signed)
Patient advised as instructed via telephone, Rx's for Ziac anc Coumadin sent to [facility].  She will have PT/INR checked at [facility] in one week.

## 2012-08-10 NOTE — Telephone Encounter (Signed)
Left message on machine at home for patient to return call. 

## 2012-08-10 NOTE — Telephone Encounter (Signed)
Can we have her go back to 3mg  Coumadin daily and then repeat INR in 1 week?

## 2012-08-10 NOTE — Telephone Encounter (Signed)
Spoke with patient via telephone, she stated that she takes 4mg  daily.  She stated that she can't remember if we increased her dose or Dr. Lemar Livings.

## 2012-08-15 DIAGNOSIS — I2699 Other pulmonary embolism without acute cor pulmonale: Secondary | ICD-10-CM | POA: Diagnosis not present

## 2012-08-15 LAB — PROTIME-INR

## 2012-08-17 ENCOUNTER — Telehealth: Payer: Self-pay | Admitting: Internal Medicine

## 2012-08-17 NOTE — Telephone Encounter (Signed)
Received a note on INR of 2.4 for this patient. This is at goal. She should continue current dose of coumadin. Can we set her up in the [facility] for INR management?

## 2012-08-18 ENCOUNTER — Ambulatory Visit: Payer: Self-pay | Admitting: General Surgery

## 2012-08-18 DIAGNOSIS — M899 Disorder of bone, unspecified: Secondary | ICD-10-CM | POA: Diagnosis not present

## 2012-08-18 DIAGNOSIS — M810 Age-related osteoporosis without current pathological fracture: Secondary | ICD-10-CM | POA: Diagnosis not present

## 2012-08-18 NOTE — Telephone Encounter (Signed)
Patient notified as instructed by telephone. Was advised by patient that she does not have transportation to go to [facility] and always has it done at [facility]. Advised patient that she needs to have her PT/ INR checked again in two weeks. Called and advised Shanon Rosser (nurse at [facility]) that recheck needs to be done in 2 weeks. Dr. Dan Humphreys aware.

## 2012-08-22 DIAGNOSIS — I4891 Unspecified atrial fibrillation: Secondary | ICD-10-CM | POA: Diagnosis not present

## 2012-08-23 ENCOUNTER — Encounter: Payer: Self-pay | Admitting: Internal Medicine

## 2012-08-23 ENCOUNTER — Ambulatory Visit (INDEPENDENT_AMBULATORY_CARE_PROVIDER_SITE_OTHER): Payer: Medicare Other | Admitting: Internal Medicine

## 2012-08-23 VITALS — BP 144/83 | HR 72 | Temp 97.8°F | Ht 59.75 in | Wt 154.0 lb

## 2012-08-23 DIAGNOSIS — G4700 Insomnia, unspecified: Secondary | ICD-10-CM | POA: Diagnosis not present

## 2012-08-23 DIAGNOSIS — I10 Essential (primary) hypertension: Secondary | ICD-10-CM

## 2012-08-23 DIAGNOSIS — Z7901 Long term (current) use of anticoagulants: Secondary | ICD-10-CM

## 2012-08-23 DIAGNOSIS — C50919 Malignant neoplasm of unspecified site of unspecified female breast: Secondary | ICD-10-CM | POA: Diagnosis not present

## 2012-08-23 MED ORDER — ALPRAZOLAM 0.5 MG PO TABS: 0.5000 mg | ORAL_TABLET | Freq: Every evening | ORAL | Status: DC | PRN

## 2012-08-23 NOTE — Assessment & Plan Note (Signed)
BP Readings from Last 3 Encounters:  08/23/12 144/83  07/07/12 130/80  06/02/12 112/70   BP generally well controlled on current medications. Will continue.Will check renal function with labs.

## 2012-08-23 NOTE — Assessment & Plan Note (Signed)
Tolerated Lovenox well during recent surgery. No recent issues with bleeding. Had INR checked yesterday. Will request results on this. Will continue Coumadin 3mg  daily for now.Goal INR 2-3.

## 2012-08-23 NOTE — Assessment & Plan Note (Signed)
S/p left mastectomy. Doing well. Having some trouble tolerating Letrozole because of fatigue. Will continue this medication for now. Follow up in 4 weeks.

## 2012-08-23 NOTE — Progress Notes (Signed)
Subjective:    Patient ID: [name], female    DOB: [birth year], 79 y.o.   MRN: [id number]  HPI 79YO female with h/o left breast cancer s/p left mastectomy 07/22/2012 presents for follow up. She is doing well. Reports no pain with surgery. Continues to have JP drain in place and plan is to leave in for next 10 days. Place on Letrozole by surgeon. Having some trouble tolerating this medication with some fatigue. Plans to continue medication for another 2 weeks then decide about long term use.  Tolerated bridge of anticoagulation with lovenox well. Back on coumadin 3 mg daily. Had INR checked with surgeon yesterday, unsure results. No issues with easy bruising or bleeding.   Outpatient Encounter Prescriptions as of 08/23/2012  Medication Sig Dispense Refill  . ALPRAZolam (XANAX) 0.5 MG tablet Take 1 tablet (0.5 mg total) by mouth at bedtime as needed for sleep or anxiety.  90 tablet  1  . bisoprolol-hydrochlorothiazide (ZIAC) 5-6.25 MG per tablet Take 1 tablet by mouth daily.  30 tablet  6  . letrozole (FEMARA) 2.5 MG tablet Take 2.5 mg by mouth daily.      . Pseudoeph-Doxylamine-DM-APAP (NYQUIL PO) Take by mouth. 2 teaspoons prn        . warfarin (COUMADIN) 3 MG tablet Take 1 tablet (3 mg total) by mouth daily.  30 tablet  0   BP 144/83  Pulse 72  Temp 97.8 F (36.6 C) (Oral)  Ht 4' 11.75" (1.518 m)  Wt 154 lb (69.854 kg)  BMI 30.33 kg/m2  SpO2 95%  Review of Systems  Constitutional: Negative for fever, chills, appetite change, fatigue and unexpected weight change.  HENT: Negative for ear pain, congestion, sore throat, trouble swallowing, neck pain, voice change and sinus pressure.   Eyes: Negative for visual disturbance.  Respiratory: Negative for cough, shortness of breath, wheezing and stridor.   Cardiovascular: Negative for chest pain, palpitations and leg swelling.  Gastrointestinal: Negative for nausea, vomiting, abdominal pain, diarrhea, constipation, blood in stool,  abdominal distention and anal bleeding.  Genitourinary: Negative for dysuria and flank pain.  Musculoskeletal: Negative for myalgias, arthralgias and gait problem.  Skin: Negative for color change and rash.  Neurological: Negative for dizziness and headaches.  Hematological: Negative for adenopathy. Does not bruise/bleed easily.  Psychiatric/Behavioral: Negative for suicidal ideas, sleep disturbance and dysphoric mood. The patient is not nervous/anxious.        Objective:   Physical Exam  Constitutional: She is oriented to person, place, and time. She appears well-developed and well-nourished. No distress.  HENT:  Head: Normocephalic and atraumatic.  Right Ear: External ear normal.  Left Ear: External ear normal.  Nose: Nose normal.  Mouth/Throat: Oropharynx is clear and moist. No oropharyngeal exudate.  Eyes: Conjunctivae normal are normal. Pupils are equal, round, and reactive to light. Right eye exhibits no discharge. Left eye exhibits no discharge. No scleral icterus.  Neck: Normal range of motion. Neck supple. No tracheal deviation present. No thyromegaly present.  Cardiovascular: Normal rate, regular rhythm, normal heart sounds and intact distal pulses.  Exam reveals no gallop and no friction rub.   No murmur heard. Pulmonary/Chest: Effort normal and breath sounds normal. No accessory muscle usage. Not tachypneic. No respiratory distress. She has no wheezes. She has no rales. She exhibits no tenderness.    Musculoskeletal: Normal range of motion. She exhibits no edema and no tenderness.  Lymphadenopathy:    She has no cervical adenopathy.  Neurological: She is alert and oriented to  person, place, and time. No cranial nerve deficit. She exhibits normal muscle tone. Coordination normal.  Skin: Skin is warm and dry. No rash noted. She is not diaphoretic. No erythema. No pallor.  Psychiatric: She has a normal mood and affect. Her behavior is normal. Judgment and thought content  normal.          Assessment & Plan:

## 2012-08-24 ENCOUNTER — Telehealth: Payer: Self-pay | Admitting: Internal Medicine

## 2012-08-24 NOTE — Telephone Encounter (Signed)
Bone density showed osteopenia with T-Score of -1.7. I would recommend that we discuss treatment at her next visit in 4 weeks.

## 2012-08-24 NOTE — Telephone Encounter (Signed)
Patient notified as instructed by telephone. 

## 2012-08-24 NOTE — Telephone Encounter (Signed)
INR from Dr. Lemar Livings received today, was 2.2. This is at goal. I would recommend continue Coumadin same dose and repeat INR in 1 month. See below message as well.

## 2012-08-31 DIAGNOSIS — M810 Age-related osteoporosis without current pathological fracture: Secondary | ICD-10-CM | POA: Diagnosis not present

## 2012-08-31 DIAGNOSIS — E785 Hyperlipidemia, unspecified: Secondary | ICD-10-CM | POA: Diagnosis not present

## 2012-08-31 DIAGNOSIS — Z7901 Long term (current) use of anticoagulants: Secondary | ICD-10-CM | POA: Diagnosis not present

## 2012-08-31 DIAGNOSIS — I10 Essential (primary) hypertension: Secondary | ICD-10-CM | POA: Diagnosis not present

## 2012-09-01 ENCOUNTER — Telehealth: Payer: Self-pay | Admitting: Internal Medicine

## 2012-09-01 DIAGNOSIS — Z7901 Long term (current) use of anticoagulants: Secondary | ICD-10-CM

## 2012-09-01 NOTE — Telephone Encounter (Signed)
Labs show stable kidney function, blood counts. We can review at her visit.

## 2012-09-02 MED ORDER — WARFARIN SODIUM 3 MG PO TABS: 3.0000 mg | ORAL_TABLET | Freq: Every day | ORAL | Status: DC

## 2012-09-02 NOTE — Telephone Encounter (Signed)
Spoke with patient and advised results   

## 2012-09-07 ENCOUNTER — Telehealth: Payer: Self-pay | Admitting: Internal Medicine

## 2012-09-07 NOTE — Telephone Encounter (Signed)
Bone density testing from 08/18/2012 from Dr. Lemar Livings showed T-score of -1.7 consistent with osteoporosis. Has pt ever taken medication such as Fosamax? I would recommend starting a medication like this to help prevent fractures. We can either start or hold off until her follow up visit to discuss.

## 2012-09-13 NOTE — Telephone Encounter (Signed)
LMTCB

## 2012-09-13 NOTE — Telephone Encounter (Signed)
Patient wants to wait until her follow up appoint with you Dr. Dan Humphreys before taking any new meds

## 2012-09-14 NOTE — Telephone Encounter (Signed)
Noted  

## 2012-09-22 ENCOUNTER — Encounter: Payer: Self-pay | Admitting: Internal Medicine

## 2012-09-22 ENCOUNTER — Telehealth: Payer: Self-pay | Admitting: Internal Medicine

## 2012-09-22 ENCOUNTER — Ambulatory Visit (INDEPENDENT_AMBULATORY_CARE_PROVIDER_SITE_OTHER): Payer: Medicare Other | Admitting: Internal Medicine

## 2012-09-22 VITALS — BP 140/84 | HR 74 | Temp 97.6°F | Wt 150.0 lb

## 2012-09-22 DIAGNOSIS — M899 Disorder of bone, unspecified: Secondary | ICD-10-CM

## 2012-09-22 DIAGNOSIS — M8580 Other specified disorders of bone density and structure, unspecified site: Secondary | ICD-10-CM | POA: Insufficient documentation

## 2012-09-22 DIAGNOSIS — M949 Disorder of cartilage, unspecified: Secondary | ICD-10-CM

## 2012-09-22 DIAGNOSIS — I10 Essential (primary) hypertension: Secondary | ICD-10-CM | POA: Diagnosis not present

## 2012-09-22 DIAGNOSIS — Z7901 Long term (current) use of anticoagulants: Secondary | ICD-10-CM

## 2012-09-22 DIAGNOSIS — C50919 Malignant neoplasm of unspecified site of unspecified female breast: Secondary | ICD-10-CM

## 2012-09-22 DIAGNOSIS — C50912 Malignant neoplasm of unspecified site of left female breast: Secondary | ICD-10-CM

## 2012-09-22 LAB — PROTIME-INR
INR: 2.7 ratio — ABNORMAL HIGH (ref 0.8–1.0)
Prothrombin Time: 27.6 s — ABNORMAL HIGH (ref 10.2–12.4)

## 2012-09-22 NOTE — Patient Instructions (Signed)
Start over the counter Vit D 2000units daily. ([employer])

## 2012-09-22 NOTE — Assessment & Plan Note (Signed)
Will check INR with labs today. Goal INR 2-3. Chronic anticoagulation for h/o DVT/PE.

## 2012-09-22 NOTE — Assessment & Plan Note (Signed)
  BP Readings from Last 3 Encounters:  09/22/12 140/84  08/23/12 144/83  07/07/12 130/80   BP generally well controlled on current medication. Will continue to monitor.

## 2012-09-22 NOTE — Assessment & Plan Note (Signed)
Reviewed recent Bone density test. Discussed options for treatment including bisphosphonates and prolia. Pt would like to try Prolia. Will look into coverage. Will also start Vit D 2000units daily.

## 2012-09-22 NOTE — Assessment & Plan Note (Signed)
S/p Left mastectomy. Doing well. Pain-free. Incision site healing well. Rx for prosthetic breast given.Will continue Femara. Follow up 3 months and prn.

## 2012-09-22 NOTE — Progress Notes (Signed)
Subjective:    Patient ID: [name], female    DOB: 04-Jan-1926, 79 y.o.   MRN: [id number]  HPI 79YO female with recent h/o left breast cancer s/p mastectomy, DVT/PE on chronic anticoagulation presents for follow up.  Breast cancer - doing well. Mastectomy healed, no further drainage. No pain at site. Rx for prosthetic breast given by surgeon. On Femara, tolerating well except for few hot flashes.  HTN - compliant with meds. No chest pain, palpitations, headache.  Chronic anticoagulation - INR on coumadin has been therapeutic, no bleeding or bruising.  Outpatient Encounter Prescriptions as of 09/22/2012  Medication Sig Dispense Refill  . ALPRAZolam (XANAX) 0.5 MG tablet Take 1 tablet (0.5 mg total) by mouth at bedtime as needed for sleep or anxiety.  90 tablet  1  . bisoprolol-hydrochlorothiazide (ZIAC) 5-6.25 MG per tablet Take 1 tablet by mouth daily.  30 tablet  6  . letrozole (FEMARA) 2.5 MG tablet Take 2.5 mg by mouth daily.      Marland Kitchen warfarin (COUMADIN) 3 MG tablet Take 1 tablet (3 mg total) by mouth daily.  30 tablet  11  . fluticasone (FLONASE) 50 MCG/ACT nasal spray Place 1 spray into the nose daily.  16 g  2  . Pseudoeph-Doxylamine-DM-APAP (NYQUIL PO) Take by mouth. 2 teaspoons prn         No facility-administered encounter medications on file as of 09/22/2012.   BP 140/84  Pulse 74  Temp(Src) 97.6 F (36.4 C) (Oral)  Wt 150 lb (68.04 kg)  BMI 29.53 kg/m2  SpO2 97%  Review of Systems  Constitutional: Negative for fever, chills, appetite change, fatigue and unexpected weight change.  HENT: Negative for ear pain, congestion, sore throat, trouble swallowing, neck pain, voice change and sinus pressure.   Eyes: Negative for visual disturbance.  Respiratory: Negative for cough, shortness of breath, wheezing and stridor.   Cardiovascular: Negative for chest pain, palpitations and leg swelling.  Gastrointestinal: Negative for nausea, vomiting, abdominal pain, diarrhea,  constipation, blood in stool, abdominal distention and anal bleeding.  Genitourinary: Negative for dysuria and flank pain.  Musculoskeletal: Negative for myalgias, arthralgias and gait problem.  Skin: Negative for color change and rash.  Neurological: Negative for dizziness and headaches.  Hematological: Negative for adenopathy. Does not bruise/bleed easily.  Psychiatric/Behavioral: Negative for suicidal ideas, sleep disturbance and dysphoric mood. The patient is not nervous/anxious.        Objective:   Physical Exam  Constitutional: She is oriented to person, place, and time. She appears well-developed and well-nourished. No distress.  HENT:  Head: Normocephalic and atraumatic.  Right Ear: External ear normal.  Left Ear: External ear normal.  Nose: Nose normal.  Mouth/Throat: Oropharynx is clear and moist. No oropharyngeal exudate.  Eyes: Conjunctivae are normal. Pupils are equal, round, and reactive to light. Right eye exhibits no discharge. Left eye exhibits no discharge. No scleral icterus.  Neck: Normal range of motion. Neck supple. No tracheal deviation present. No thyromegaly present.  Cardiovascular: Normal rate, regular rhythm, normal heart sounds and intact distal pulses.  Exam reveals no gallop and no friction rub.   No murmur heard. Pulmonary/Chest: Effort normal and breath sounds normal. No respiratory distress. She has no wheezes. She has no rales. She exhibits no tenderness. Breasts are asymmetrical.    Musculoskeletal: Normal range of motion. She exhibits no edema and no tenderness.  Lymphadenopathy:    She has no cervical adenopathy.  Neurological: She is alert and oriented to person, place, and time.  No cranial nerve deficit. She exhibits normal muscle tone. Coordination normal.  Skin: Skin is warm and dry. No rash noted. She is not diaphoretic. No erythema. No pallor.  Psychiatric: She has a normal mood and affect. Her behavior is normal. Judgment and thought content  normal.          Assessment & Plan:

## 2012-09-22 NOTE — Telephone Encounter (Signed)
Sent prolia coverage ?  to [facility] 09/22/12/rbh

## 2012-09-29 ENCOUNTER — Telehealth: Payer: Self-pay | Admitting: Internal Medicine

## 2012-09-29 NOTE — Telephone Encounter (Signed)
Caller: Carla/; Phone: [phone]; Reason for Call: [facility] is needing to verify diagnosis code for visit on 2-5.  Please Call and verify this code.

## 2012-10-03 DIAGNOSIS — L570 Actinic keratosis: Secondary | ICD-10-CM | POA: Diagnosis not present

## 2012-10-03 DIAGNOSIS — Z85828 Personal history of other malignant neoplasm of skin: Secondary | ICD-10-CM | POA: Diagnosis not present

## 2012-10-05 ENCOUNTER — Telehealth: Payer: Self-pay | Admitting: *Deleted

## 2012-10-05 NOTE — Telephone Encounter (Signed)
Patient informed Jake Seats has been approved and once it comes in we will call her to let her know

## 2012-10-05 NOTE — Telephone Encounter (Signed)
Patient called wanting to know if her Prolia has been approved. Paperwork on your desk.

## 2012-10-05 NOTE — Telephone Encounter (Signed)
Left message for [name] on 3/6, has not returned my call. Called again today....lmtcb

## 2012-10-07 ENCOUNTER — Telehealth: Payer: Self-pay | Admitting: Internal Medicine

## 2012-10-07 NOTE — Telephone Encounter (Signed)
Pt has been scheduled for a Prolia injection 10/19/2012 which should be covered 100%.  Pt is aware of this appt.  Pt asks to remind Dr. Dan Humphreys that she is on Coumadin.

## 2012-10-07 NOTE — Telephone Encounter (Signed)
OK. Is she having any issues with the Coumadin? Is that why she is reminding me?

## 2012-10-07 NOTE — Telephone Encounter (Signed)
FYI please read below 

## 2012-10-07 NOTE — Telephone Encounter (Signed)
Never returned my call.

## 2012-10-07 NOTE — Telephone Encounter (Signed)
Spoke with patient and she stated she just wanted to make sure this was noted. It is a habit she has when she speaks to anyone.

## 2012-10-08 ENCOUNTER — Encounter: Payer: Self-pay | Admitting: General Surgery

## 2012-10-11 ENCOUNTER — Telehealth: Payer: Self-pay | Admitting: Internal Medicine

## 2012-10-11 NOTE — Telephone Encounter (Signed)
Pt scheduled for Prolia injection on 3/26 but would like to speak with someone about pros and cons of this medication.  Please call today between 1pm-2pm if possible.

## 2012-10-11 NOTE — Telephone Encounter (Signed)
Patient informed and declined scheduling an OV with Dr. Dan Humphreys, she will just get the injection.

## 2012-10-11 NOTE — Telephone Encounter (Signed)
Forwarded to Dr. Dan Humphreys for advice

## 2012-10-11 NOTE — Telephone Encounter (Signed)
We will have to set up a visit to discuss this. We could do it the same day and then she can decide about whether or not to get the medication.

## 2012-10-19 ENCOUNTER — Ambulatory Visit: Payer: Medicare Other

## 2012-10-20 DIAGNOSIS — I2699 Other pulmonary embolism without acute cor pulmonale: Secondary | ICD-10-CM | POA: Diagnosis not present

## 2012-10-20 LAB — PROTIME-INR

## 2012-10-21 ENCOUNTER — Telehealth: Payer: Self-pay | Admitting: Internal Medicine

## 2012-10-21 NOTE — Telephone Encounter (Signed)
Coumadin level was normal 10/20/2012 at 2. Continue current dose. Repeat INR 1 month.

## 2012-10-24 NOTE — Telephone Encounter (Signed)
Patient verbally informed and verbalized understanding.

## 2012-10-25 ENCOUNTER — Telehealth: Payer: Self-pay | Admitting: *Deleted

## 2012-10-25 NOTE — Telephone Encounter (Signed)
Dr Dan Humphreys,   [facility] called with clarification on diagnosis codes for the patient. Reference number: [id number] [phone]  Asher Muir

## 2012-10-25 NOTE — Telephone Encounter (Signed)
I will need more information to know what this is in reference to.

## 2012-10-26 NOTE — Telephone Encounter (Signed)
She has a history of DVT/PE.

## 2012-10-26 NOTE — Telephone Encounter (Signed)
They will use V58.61 since it was already on the form, just needed clarification.

## 2012-10-26 NOTE — Telephone Encounter (Signed)
Spoke with Paula Compton at [facility] and she just needed clarification for a dx on labs drawn on 08/31/12. Gave her the code of V58.61 chronic anticoagulation

## 2012-11-04 ENCOUNTER — Encounter: Payer: Self-pay | Admitting: Internal Medicine

## 2012-11-15 ENCOUNTER — Ambulatory Visit (INDEPENDENT_AMBULATORY_CARE_PROVIDER_SITE_OTHER): Payer: Medicare Other | Admitting: *Deleted

## 2012-11-15 DIAGNOSIS — M949 Disorder of cartilage, unspecified: Secondary | ICD-10-CM | POA: Diagnosis not present

## 2012-11-15 DIAGNOSIS — M899 Disorder of bone, unspecified: Secondary | ICD-10-CM

## 2012-11-15 MED ORDER — DENOSUMAB 60 MG/ML ~~LOC~~ SOLN
60.0000 mg | Freq: Once | SUBCUTANEOUS | Status: AC
  Administered 2012-11-15: 60 mg via SUBCUTANEOUS

## 2012-11-21 DIAGNOSIS — I2699 Other pulmonary embolism without acute cor pulmonale: Secondary | ICD-10-CM | POA: Diagnosis not present

## 2012-11-28 ENCOUNTER — Telehealth: Payer: Self-pay | Admitting: Internal Medicine

## 2012-11-28 NOTE — Telephone Encounter (Signed)
INR was at goal 2.3. Would recommend continuing Coumadin current dose and repeat INR in 1 month.

## 2012-11-28 NOTE — Telephone Encounter (Signed)
Notified patient that she can continue the same dosage of her coumadin and repeat INR in 1 month.

## 2012-11-28 NOTE — Telephone Encounter (Signed)
No, I have not seen anything. °

## 2012-11-28 NOTE — Telephone Encounter (Signed)
Pt had blood work last week to see if her Coumadin dose should stay the same or change.  Pt asking for her results and what they mean.

## 2012-11-28 NOTE — Telephone Encounter (Signed)
Have you seen any results about her PT/INR from last week?

## 2012-11-30 ENCOUNTER — Other Ambulatory Visit: Payer: Self-pay | Admitting: Internal Medicine

## 2012-12-12 ENCOUNTER — Ambulatory Visit: Payer: Self-pay | Admitting: General Surgery

## 2012-12-21 DIAGNOSIS — I2699 Other pulmonary embolism without acute cor pulmonale: Secondary | ICD-10-CM | POA: Diagnosis not present

## 2012-12-21 LAB — PROTIME-INR

## 2012-12-22 ENCOUNTER — Telehealth: Payer: Self-pay | Admitting: Internal Medicine

## 2012-12-22 NOTE — Telephone Encounter (Signed)
Called and advised patient of results

## 2012-12-22 NOTE — Telephone Encounter (Signed)
INR was 1.9. I would recommend continuing coumadin same dose and repeating INR in 1 month.

## 2012-12-24 ENCOUNTER — Other Ambulatory Visit: Payer: Self-pay | Admitting: General Surgery

## 2012-12-24 DIAGNOSIS — C50912 Malignant neoplasm of unspecified site of left female breast: Secondary | ICD-10-CM

## 2012-12-28 ENCOUNTER — Ambulatory Visit (INDEPENDENT_AMBULATORY_CARE_PROVIDER_SITE_OTHER): Payer: Medicare Other | Admitting: Internal Medicine

## 2012-12-28 ENCOUNTER — Encounter: Payer: Self-pay | Admitting: Internal Medicine

## 2012-12-28 VITALS — BP 132/70 | HR 74 | Temp 98.0°F | Ht 59.0 in | Wt 155.0 lb

## 2012-12-28 DIAGNOSIS — L821 Other seborrheic keratosis: Secondary | ICD-10-CM | POA: Insufficient documentation

## 2012-12-28 DIAGNOSIS — Z7901 Long term (current) use of anticoagulants: Secondary | ICD-10-CM

## 2012-12-28 DIAGNOSIS — C50919 Malignant neoplasm of unspecified site of unspecified female breast: Secondary | ICD-10-CM | POA: Diagnosis not present

## 2012-12-28 DIAGNOSIS — I10 Essential (primary) hypertension: Secondary | ICD-10-CM | POA: Diagnosis not present

## 2012-12-28 DIAGNOSIS — C50912 Malignant neoplasm of unspecified site of left female breast: Secondary | ICD-10-CM

## 2012-12-28 MED ORDER — BISOPROLOL-HYDROCHLOROTHIAZIDE 5-6.25 MG PO TABS: 1.0000 | ORAL_TABLET | Freq: Every day | ORAL | Status: DC

## 2012-12-28 MED ORDER — ALPRAZOLAM 0.5 MG PO TABS: ORAL_TABLET | ORAL | Status: DC

## 2012-12-28 MED ORDER — WARFARIN SODIUM 3 MG PO TABS: 3.0000 mg | ORAL_TABLET | Freq: Every day | ORAL | Status: DC

## 2012-12-28 MED ORDER — MONTELUKAST SODIUM 10 MG PO TABS: 10.0000 mg | ORAL_TABLET | Freq: Every day | ORAL | Status: DC

## 2012-12-28 MED ORDER — DIPHENOXYLATE-ATROPINE 2.5-0.025 MG PO TABS: 1.0000 | ORAL_TABLET | Freq: Four times a day (QID) | ORAL | Status: DC | PRN

## 2012-12-28 NOTE — Progress Notes (Signed)
Subjective:    Patient ID: [name], female    DOB: 12-Oct-1925, 79 y.o.   MRN: [id number]  HPI 79 year old female with recent history of left breast cancer status post mastectomy, history of pulmonary embolus on chronic anticoagulation with Coumadin presents for followup. She reports she is generally doing well. She is concerned about a lesion on the skin of her left thigh. This lesion is not painful. She is unsure when I first appeared. It is described as raised.  She reports that she is currently wearing a prosthetic bra. She finds this uncomfortable because it extends over her mastectomy scar.  Outpatient Encounter Prescriptions as of 12/28/2012  Medication Sig Dispense Refill  . ALPRAZolam (XANAX) 0.5 MG tablet TAKE 1 TABLET EVERY NIGHT AT BEDTIME AS NEEDED FOR SLEEP OR ANXIETY  90 tablet  1  . bisoprolol-hydrochlorothiazide (ZIAC) 5-6.25 MG per tablet Take 1 tablet by mouth daily.  90 tablet  4  . Cholecalciferol (VITAMIN D PO) Take 2,000 mg by mouth daily.      Marland Kitchen letrozole (FEMARA) 2.5 MG tablet TAKE 1 TABLET DAILY  90 tablet  3  . warfarin (COUMADIN) 3 MG tablet Take 1 tablet (3 mg total) by mouth daily.  90 tablet  4  . diphenoxylate-atropine (LOMOTIL) 2.5-0.025 MG per tablet Take 1 tablet by mouth 4 (four) times daily as needed for diarrhea or loose stools.  90 tablet  0  . montelukast (SINGULAIR) 10 MG tablet Take 1 tablet (10 mg total) by mouth at bedtime.  90 tablet  4   No facility-administered encounter medications on file as of 12/28/2012.   BP 132/70  Pulse 74  Temp(Src) 98 F (36.7 C) (Oral)  Ht 4\' 11"  (1.499 m)  Wt 155 lb (70.308 kg)  BMI 31.29 kg/m2  SpO2 95%  Review of Systems  Constitutional: Negative for fever, chills, appetite change, fatigue and unexpected weight change.  HENT: Negative for ear pain, congestion, sore throat, trouble swallowing, neck pain, voice change and sinus pressure.   Eyes: Negative for visual disturbance.  Respiratory: Negative for  cough, shortness of breath, wheezing and stridor.   Cardiovascular: Negative for chest pain, palpitations and leg swelling.  Gastrointestinal: Negative for nausea, vomiting, abdominal pain, diarrhea, constipation, blood in stool, abdominal distention and anal bleeding.  Genitourinary: Negative for dysuria and flank pain.  Musculoskeletal: Negative for myalgias, arthralgias and gait problem.  Skin: Negative for color change and rash.  Neurological: Negative for dizziness and headaches.  Hematological: Negative for adenopathy. Does not bruise/bleed easily.  Psychiatric/Behavioral: Negative for suicidal ideas, sleep disturbance and dysphoric mood. The patient is not nervous/anxious.        Objective:   Physical Exam  Constitutional: She is oriented to person, place, and time. She appears well-developed and well-nourished. No distress.  HENT:  Head: Normocephalic and atraumatic.  Right Ear: External ear normal.  Left Ear: External ear normal.  Nose: Nose normal.  Mouth/Throat: Oropharynx is clear and moist. No oropharyngeal exudate.  Eyes: Conjunctivae are normal. Pupils are equal, round, and reactive to light. Right eye exhibits no discharge. Left eye exhibits no discharge. No scleral icterus.  Neck: Normal range of motion. Neck supple. No tracheal deviation present. No thyromegaly present.  Cardiovascular: Normal rate, regular rhythm, normal heart sounds and intact distal pulses.  Exam reveals no gallop and no friction rub.   No murmur heard. Pulmonary/Chest: Effort normal and breath sounds normal. No accessory muscle usage. Not tachypneic. No respiratory distress. She has no decreased breath  sounds. She has no wheezes. She has no rhonchi. She has no rales. She exhibits no tenderness.  Musculoskeletal: Normal range of motion. She exhibits no edema and no tenderness.  Lymphadenopathy:    She has no cervical adenopathy.  Neurological: She is alert and oriented to person, place, and time. No  cranial nerve deficit. She exhibits normal muscle tone. Coordination normal.  Skin: Skin is warm and dry. Lesion (skin colored papule left posterior thigh approx 1cm, consistent with SK) noted. No rash noted. She is not diaphoretic. No erythema. No pallor.  Psychiatric: She has a normal mood and affect. Her behavior is normal. Judgment and thought content normal.          Assessment & Plan:

## 2012-12-28 NOTE — Assessment & Plan Note (Signed)
Generally doing well. Continues on Femara. Will follow.

## 2012-12-28 NOTE — Assessment & Plan Note (Signed)
BP Readings from Last 3 Encounters:  12/28/12 132/70  09/22/12 140/84  08/23/12 144/83   BP well controlled on current medications. Will continue.

## 2012-12-28 NOTE — Patient Instructions (Signed)
Seborrheic Keratosis  Seborrheic keratosis is a common, noncancerous (benign) skin growth that can occur anywhere on the skin.It looks like "stuck-on," waxy, rough, tan, brown, or black spots on the skin. These skin growths can be flat or raised.They are often called "barnacles" because of their pasted-on appearance.Usually, these skin growths appear in adulthood, around age 77, and increase in number as you age. They may also develop during pregnancy or following estrogen therapy. Many people may only have one growth appear in their lifetime, while some people may develop many growths.  CAUSES  It is unknown what causes these skin growths, but they appear to run in families.  SYMPTOMS  Seborrheic keratosis is often located on the face, chest, shoulders, back, or other areas. These growths are:   Usually painless, but may become irritated and itchy.   Yellow, brown, black, or other colors.   Slightly raised or have a flat surface.   Sometimes rough or wart-like in texture.   Often waxy on the surface.   Round or oval-shaped.   Sometimes "stuck-on" in appearance.   Sometimes single, but there are usually many growths.  Any growth that bleeds, itches on a regular basis, becomes inflamed, or becomes irritated needs to be evaluated by a skin specialist (dermatologist).  DIAGNOSIS  Diagnosis is mainly based on the way the growths appear. In some cases, it can be difficult to tell this type of skin growth from skin cancer. A skin growth tissue sample (biopsy) may be used to confirm the diagnosis.  TREATMENT  Most often, treatment is not needed because the skin growths are benign.If the skin growth is irritated easily by clothing or jewelry, causing it to scab or bleed, treatment may be recommended. Patients may also choose to have the growths removed because they do not like their appearance. Most commonly, these growths are treated with cryosurgery.   In cryosurgery, liquid nitrogen is applied to "freeze" the growth. The growth usually falls off within a matter of days. A blister may form and dry into a scab that will also fall off. After the growth or scab falls off, it may leave a dark or light spot on the skin. This color may fade over time, or it may remain permanent on the skin.  HOME CARE INSTRUCTIONS  If the skin growths are treated with cryosurgery, the treated area needs to be kept clean with water and soap.  SEEK MEDICAL CARE IF:   You have questions about these growths or other skin problems.   You develop new symptoms, including:   A change in the appearance of the skin growth.   New growths.   Any bleeding, itching, or pain in the growths.   A skin growth that looks similar to seborrheic keratosis.  Document Released: 08/15/2010 Document Revised: 10/05/2011 Document Reviewed: 08/15/2010  [employer] Patient Information 2014 [employer].

## 2012-12-28 NOTE — Assessment & Plan Note (Signed)
Offered reassurance today that this is a benign lesion. Discussed potentially removing the lesion 2 excision or cryotherapy but given the benign nature of these lesions will monitor for now.

## 2012-12-28 NOTE — Assessment & Plan Note (Signed)
Recent INR therapeutic. Will plan to repeat INR in 1 month. Continue current dose coumadin.

## 2013-01-09 DIAGNOSIS — M7989 Other specified soft tissue disorders: Secondary | ICD-10-CM | POA: Diagnosis not present

## 2013-01-09 DIAGNOSIS — I87099 Postthrombotic syndrome with other complications of unspecified lower extremity: Secondary | ICD-10-CM | POA: Diagnosis not present

## 2013-01-11 ENCOUNTER — Ambulatory Visit (INDEPENDENT_AMBULATORY_CARE_PROVIDER_SITE_OTHER): Payer: Medicare Other | Admitting: General Surgery

## 2013-01-11 ENCOUNTER — Encounter: Payer: Self-pay | Admitting: General Surgery

## 2013-01-11 ENCOUNTER — Encounter: Payer: Self-pay | Admitting: Internal Medicine

## 2013-01-11 VITALS — BP 134/74 | HR 72 | Resp 14 | Ht 59.0 in | Wt 156.0 lb

## 2013-01-11 DIAGNOSIS — Z853 Personal history of malignant neoplasm of breast: Secondary | ICD-10-CM | POA: Diagnosis not present

## 2013-01-11 NOTE — Patient Instructions (Addendum)
Patient to return in December 2014   Right breast diagnotic mammogram

## 2013-01-11 NOTE — Progress Notes (Signed)
Patient ID: [name], female   DOB: [birth year], 79 y.o.   MRN: [id number]  Chief Complaint  Patient presents with  . Breast Cancer    HPI [name] is a 79 y.o. female here today following up from breast cancer. Patient had an left mastectomy in 2013. Patient dose have an father with an history of  Breast cancer. Patient states she is doing well with no problems. At times the patient has appreciated some mild fullness of the left forearm. This has not been a consistent concern. She is troubled by the weight of her silicon prosthesis. HPI  Past Medical History  Diagnosis Date  . S/P IVC filter 2009  . Pulmonary embolus 2009  . Insomnia   . Allergy   . Hypertension   . Osteoporosis     Prolia 10/2012    Past Surgical History  Procedure Laterality Date  . Replacement total knee      right  . Cholecystectomy    . Hernia repair    . Bladder surgery    . Tonsillectomy    . Breast surgery Left 2013    mastectomy    Family History  Problem Relation Age of Onset  . COPD Mother   . Stroke Father   . Cancer Father     breast    Social History History  Substance Use Topics  . Smoking status: Former Smoker    Quit date: 05/13/1999  . Smokeless tobacco: Never Used  . Alcohol Use: No    Allergies  Allergen Reactions  . Erythromycin   . Sulfa Drugs Cross Reactors     rash    Current Outpatient Prescriptions  Medication Sig Dispense Refill  . ALPRAZolam (XANAX) 0.5 MG tablet TAKE 1 TABLET EVERY NIGHT AT BEDTIME AS NEEDED FOR SLEEP OR ANXIETY  90 tablet  1  . bisoprolol-hydrochlorothiazide (ZIAC) 5-6.25 MG per tablet Take 1 tablet by mouth daily.  90 tablet  4  . Cholecalciferol (VITAMIN D PO) Take 2,000 mg by mouth daily.      . diphenoxylate-atropine (LOMOTIL) 2.5-0.025 MG per tablet Take 1 tablet by mouth 4 (four) times daily as needed for diarrhea or loose stools.  90 tablet  0  . letrozole (FEMARA) 2.5 MG tablet TAKE 1 TABLET DAILY  90 tablet  3  .  montelukast (SINGULAIR) 10 MG tablet Take 1 tablet (10 mg total) by mouth at bedtime.  90 tablet  4  . warfarin (COUMADIN) 3 MG tablet Take 1 tablet (3 mg total) by mouth daily.  90 tablet  4   No current facility-administered medications for this visit.    Review of Systems Review of Systems  Constitutional: Negative.   Respiratory: Negative.   Cardiovascular: Negative.     Blood pressure 134/74, pulse 72, resp. rate 14, height 4\' 11"  (1.499 m), weight 156 lb (70.761 kg).  Physical Exam Physical Exam  Constitutional: She appears well-developed and well-nourished.  Cardiovascular: Normal rate, regular rhythm and normal heart sounds.   Pulmonary/Chest: Effort normal and breath sounds normal. Right breast exhibits no inverted nipple, no mass, no nipple discharge, no skin change and no tenderness.  Left mastectomy site is well healed   Musculoskeletal:  Left thigh:15 mm seborrheic keratosis.  Lymphadenopathy:    She has no cervical adenopathy.       Right axillary: No pectoral and no lateral adenopathy present.  Neurological: She is alert.  Skin: Skin is warm and dry.    Data Reviewed The upper extremities  were measured at a location 15 cm proximal to the olecranon process as well as 10 and 20 cm below the olecranon process. On the right side these measurements were 15, 25 and 18 respectively. On the left side these measurements were 15, 25 and 19 respectively.  Mild fullness of the upper aspect of the left arm, possibly related to lymphedema.  Assessment    Doing well status post left mastectomy.  Will follow the left upper extremity size for signs of progression.   Tolerating Letrazol therapy without ill effect.    Plan    A prescription for a light weight fold prosthesis was provided.  We'll arrange for follow up examination with right breast mammogram in December 2014.        [name] 01/12/2013, 8:24 PM

## 2013-01-12 ENCOUNTER — Encounter: Payer: Self-pay | Admitting: General Surgery

## 2013-01-20 DIAGNOSIS — I2699 Other pulmonary embolism without acute cor pulmonale: Secondary | ICD-10-CM | POA: Diagnosis not present

## 2013-01-20 LAB — PROTIME-INR

## 2013-01-23 ENCOUNTER — Telehealth: Payer: Self-pay | Admitting: Internal Medicine

## 2013-01-23 NOTE — Telephone Encounter (Signed)
Patient informed and verbally agree understanding.

## 2013-01-23 NOTE — Telephone Encounter (Signed)
INR was perfect at 2. Continue coumadin same dose. Repeat INR in 1 month.

## 2013-02-21 ENCOUNTER — Encounter: Payer: Self-pay | Admitting: Internal Medicine

## 2013-02-21 DIAGNOSIS — I2699 Other pulmonary embolism without acute cor pulmonale: Secondary | ICD-10-CM | POA: Diagnosis not present

## 2013-02-21 LAB — PROTIME-INR

## 2013-02-22 ENCOUNTER — Other Ambulatory Visit: Payer: Self-pay | Admitting: Internal Medicine

## 2013-02-22 NOTE — Telephone Encounter (Signed)
INR was perfect at 2. Continue current dose of coumadin.

## 2013-02-23 ENCOUNTER — Telehealth: Payer: Self-pay | Admitting: Internal Medicine

## 2013-02-23 DIAGNOSIS — Z7901 Long term (current) use of anticoagulants: Secondary | ICD-10-CM

## 2013-02-23 NOTE — Telephone Encounter (Signed)
The patient is out of her coumadin  bisoprolol-hydrochlorothiazide (ZIAC) 5-6.25 MG per tablet  #90  warfarin (COUMADIN) 3 MG tablet #90

## 2013-02-24 MED ORDER — WARFARIN SODIUM 3 MG PO TABS: 3.0000 mg | ORAL_TABLET | Freq: Every day | ORAL | Status: DC

## 2013-02-24 NOTE — Telephone Encounter (Signed)
Coumadin sent to pharmacy, [facility] was sent on 7/30

## 2013-02-27 ENCOUNTER — Encounter: Payer: Self-pay | Admitting: Internal Medicine

## 2013-02-28 ENCOUNTER — Telehealth: Payer: Self-pay | Admitting: *Deleted

## 2013-02-28 NOTE — Telephone Encounter (Signed)
Lawson Fiscal, the nurse called left a voicemail stating she faxed over the patient's PT/INR last week and would like to know if you would like to make any changes.

## 2013-02-28 NOTE — Telephone Encounter (Signed)
The INR on 7/29 was normal at 2. No change in coumadin dosing. Repeat INR 1 month.

## 2013-03-01 NOTE — Telephone Encounter (Signed)
Informed [name] and she verbalized understanding.

## 2013-03-22 DIAGNOSIS — I2699 Other pulmonary embolism without acute cor pulmonale: Secondary | ICD-10-CM | POA: Diagnosis not present

## 2013-03-22 LAB — PROTIME-INR

## 2013-03-23 ENCOUNTER — Telehealth: Payer: Self-pay | Admitting: Internal Medicine

## 2013-03-23 NOTE — Telephone Encounter (Signed)
INR 8/27 was 2, which is at goal. Continue coumadin same dose and repeat INR in 1 month.

## 2013-03-23 NOTE — Telephone Encounter (Signed)
Patient informed and verbalized understanding

## 2013-04-03 ENCOUNTER — Encounter: Payer: Self-pay | Admitting: Internal Medicine

## 2013-04-05 DIAGNOSIS — H04129 Dry eye syndrome of unspecified lacrimal gland: Secondary | ICD-10-CM | POA: Diagnosis not present

## 2013-04-10 ENCOUNTER — Encounter: Payer: Medicare Other | Admitting: Internal Medicine

## 2013-04-17 ENCOUNTER — Encounter: Payer: Medicare Other | Admitting: Internal Medicine

## 2013-04-24 DIAGNOSIS — I2699 Other pulmonary embolism without acute cor pulmonale: Secondary | ICD-10-CM | POA: Diagnosis not present

## 2013-04-24 LAB — PROTIME-INR: INR: 2.4 — AB (ref 0.9–1.1)

## 2013-04-25 ENCOUNTER — Telehealth: Payer: Self-pay | Admitting: Internal Medicine

## 2013-04-25 NOTE — Telephone Encounter (Signed)
Recent INR was 2.4. Continue coumadin same dose and repeat INR in 1 month.

## 2013-04-26 ENCOUNTER — Telehealth: Payer: Self-pay

## 2013-04-26 NOTE — Telephone Encounter (Signed)
Would like to know if she have her blood work done at the facility where she live? Would like to have it done a couple days prior to her appointment.

## 2013-04-26 NOTE — Telephone Encounter (Signed)
Patient called with concerns about her medication and also some fluid collection under her left arm pit.

## 2013-04-26 NOTE — Telephone Encounter (Signed)
appt made.  She calls and states she feels something different under her left arm and wants it checked.

## 2013-04-26 NOTE — Telephone Encounter (Signed)
That is fine 

## 2013-04-26 NOTE — Telephone Encounter (Signed)
Patient informed and verbalized understanding

## 2013-04-27 ENCOUNTER — Ambulatory Visit (INDEPENDENT_AMBULATORY_CARE_PROVIDER_SITE_OTHER): Payer: Medicare Other | Admitting: General Surgery

## 2013-04-27 ENCOUNTER — Encounter: Payer: Self-pay | Admitting: General Surgery

## 2013-04-27 VITALS — BP 140/80 | HR 66 | Resp 16 | Ht 59.0 in | Wt 158.0 lb

## 2013-04-27 DIAGNOSIS — Z853 Personal history of malignant neoplasm of breast: Secondary | ICD-10-CM

## 2013-04-27 NOTE — Progress Notes (Signed)
alongPatient ID: [name], female   DOB: [birth year], 79 y.o.   MRN: [id number]  No chief complaint on file.   HPI [name] is a 79 y.o. female here for an evaluation an lump under her left arm. Patient states she noticed about 4 to six weeks ago. The patient feels that her Femara may be contributing to her loss. Biotin was again recommended. HPI  Past Medical History  Diagnosis Date  . S/P IVC filter 2009  . Pulmonary embolus 2009  . Insomnia   . Allergy   . Hypertension   . Osteoporosis     Prolia 10/2012    Past Surgical History  Procedure Laterality Date  . Replacement total knee      right  . Cholecystectomy    . Hernia repair    . Bladder surgery    . Tonsillectomy    . Breast surgery Left 2013    mastectomy    Family History  Problem Relation Age of Onset  . COPD Mother   . Stroke Father   . Cancer Father     breast    Social History History  Substance Use Topics  . Smoking status: Former Smoker    Quit date: 05/13/1999  . Smokeless tobacco: Never Used  . Alcohol Use: No    Allergies  Allergen Reactions  . Erythromycin   . Sulfa Drugs Cross Reactors     rash    Current Outpatient Prescriptions  Medication Sig Dispense Refill  . ALPRAZolam (XANAX) 0.5 MG tablet TAKE 1 TABLET EVERY NIGHT AT BEDTIME AS NEEDED FOR SLEEP OR ANXIETY  90 tablet  1  . bisoprolol-hydrochlorothiazide (ZIAC) 5-6.25 MG per tablet Take 1 tablet by mouth daily.  90 tablet  4  . bisoprolol-hydrochlorothiazide (ZIAC) 5-6.25 MG per tablet TAKE 1 TABLET BY MOUTH DAILY.  30 tablet  5  . Cholecalciferol (VITAMIN D PO) Take 2,000 mg by mouth daily.      . diphenoxylate-atropine (LOMOTIL) 2.5-0.025 MG per tablet Take 1 tablet by mouth 4 (four) times daily as needed for diarrhea or loose stools.  90 tablet  0  . letrozole (FEMARA) 2.5 MG tablet TAKE 1 TABLET DAILY  90 tablet  3  . montelukast (SINGULAIR) 10 MG tablet Take 1 tablet (10 mg total) by mouth at bedtime.  90 tablet  4   . warfarin (COUMADIN) 3 MG tablet Take 1 tablet (3 mg total) by mouth daily.  90 tablet  1   No current facility-administered medications for this visit.    Review of Systems Review of Systems  Constitutional: Negative.   Respiratory: Negative.   Cardiovascular: Negative.     Blood pressure 140/80, pulse 66, resp. rate 16, height 4\' 11"  (1.499 m), weight 158 lb (71.668 kg).  Physical Exam Physical Exam Examination a left mastectomy site showed no evidence of loculated fluid, induration or thickening. There is prominence of the fat pad overlying the latissimus dorsi border. No axillary adenopathy is appreciated. The patient exhibits full range of motion in her shoulder.  Data Reviewed None.  Assessment    Benign clinical exam.     Plan    Followup as previously scheduled later in the year.        [name] 04/27/2013, 9:27 PM

## 2013-05-04 ENCOUNTER — Other Ambulatory Visit: Payer: Self-pay | Admitting: *Deleted

## 2013-05-04 DIAGNOSIS — Z7901 Long term (current) use of anticoagulants: Secondary | ICD-10-CM

## 2013-05-04 MED ORDER — BISOPROLOL-HYDROCHLOROTHIAZIDE 5-6.25 MG PO TABS: ORAL_TABLET | ORAL | Status: DC

## 2013-05-04 MED ORDER — MONTELUKAST SODIUM 10 MG PO TABS: 10.0000 mg | ORAL_TABLET | Freq: Every day | ORAL | Status: DC

## 2013-05-04 MED ORDER — ALPRAZOLAM 0.5 MG PO TABS: ORAL_TABLET | ORAL | Status: DC

## 2013-05-04 MED ORDER — WARFARIN SODIUM 3 MG PO TABS: 3.0000 mg | ORAL_TABLET | Freq: Every day | ORAL | Status: DC

## 2013-05-05 ENCOUNTER — Telehealth: Payer: Self-pay | Admitting: Internal Medicine

## 2013-05-05 NOTE — Telephone Encounter (Signed)
Left message to call back  

## 2013-05-05 NOTE — Telephone Encounter (Signed)
Pt returned call.  Pt asking labs will be fasting and what tests the facility needs to draw for.  Please advise.  Pt states you may leave a msg.

## 2013-05-09 NOTE — Telephone Encounter (Signed)
PT/INR. [facility] anticoagulation

## 2013-05-09 NOTE — Telephone Encounter (Signed)
What labs would you like done and dx code need to be faxed to the nurse at [phone]

## 2013-05-10 ENCOUNTER — Encounter: Payer: Self-pay | Admitting: *Deleted

## 2013-05-10 NOTE — Telephone Encounter (Signed)
Please review next encounter for further information.

## 2013-05-10 NOTE — Telephone Encounter (Signed)
Letter printed and faxed

## 2013-05-18 ENCOUNTER — Encounter: Payer: Self-pay | Admitting: *Deleted

## 2013-05-18 ENCOUNTER — Ambulatory Visit: Payer: Medicare Other

## 2013-05-19 ENCOUNTER — Ambulatory Visit (INDEPENDENT_AMBULATORY_CARE_PROVIDER_SITE_OTHER): Payer: Medicare Other | Admitting: Internal Medicine

## 2013-05-19 ENCOUNTER — Ambulatory Visit: Payer: Medicare Other | Admitting: *Deleted

## 2013-05-19 ENCOUNTER — Other Ambulatory Visit (HOSPITAL_COMMUNITY)
Admission: RE | Admit: 2013-05-19 | Discharge: 2013-05-19 | Disposition: A | Discharge status: Home / Self Care | Payer: Medicare Other | Source: Ambulatory Visit | Attending: Internal Medicine | Admitting: Internal Medicine

## 2013-05-19 ENCOUNTER — Encounter: Payer: Self-pay | Admitting: Internal Medicine

## 2013-05-19 VITALS — BP 154/84 | HR 76 | Temp 97.5°F | Resp 12 | Ht 60.0 in | Wt 155.2 lb

## 2013-05-19 DIAGNOSIS — M8580 Other specified disorders of bone density and structure, unspecified site: Secondary | ICD-10-CM

## 2013-05-19 DIAGNOSIS — M899 Disorder of bone, unspecified: Secondary | ICD-10-CM

## 2013-05-19 DIAGNOSIS — Z1151 Encounter for screening for human papillomavirus (HPV): Secondary | ICD-10-CM | POA: Diagnosis not present

## 2013-05-19 DIAGNOSIS — Z7901 Long term (current) use of anticoagulants: Secondary | ICD-10-CM | POA: Diagnosis not present

## 2013-05-19 DIAGNOSIS — Z01419 Encounter for gynecological examination (general) (routine) without abnormal findings: Secondary | ICD-10-CM | POA: Insufficient documentation

## 2013-05-19 DIAGNOSIS — I10 Essential (primary) hypertension: Secondary | ICD-10-CM

## 2013-05-19 DIAGNOSIS — Z0000 Encounter for general adult medical examination without abnormal findings: Secondary | ICD-10-CM

## 2013-05-19 MED ORDER — DENOSUMAB 60 MG/ML ~~LOC~~ SOLN
60.0000 mg | Freq: Once | SUBCUTANEOUS | Status: AC
  Administered 2013-05-19: 60 mg via SUBCUTANEOUS

## 2013-05-19 NOTE — Progress Notes (Signed)
Subjective:    Patient ID: [name], female    DOB: 02/06/1926, 79 y.o.   MRN: [id number]  HPI The patient is here for annual Medicare wellness examination and management of other chronic and acute problems.   The risk factors are reflected in the social history.  The roster of all physicians providing medical care to patient - is listed in the Snapshot section of the chart.  Activities of daily living:  The patient is 100% independent in all ADLs: dressing, toileting, feeding as well as independent mobility  Home safety : The patient has smoke detectors in the home. They wear seatbelts.  There are no firearms at home. There is no violence in the home.   There is no risks for hepatitis, STDs or HIV. There is no history of blood transfusion. They have no travel history to infectious disease endemic areas of the world.  Surgeon - Dr. Lemar Livings The patient has seen their dentist in the last six month. Dentist - Dr. Marden Noble and Dr. Donell Beers They have seen their eye doctor in the last year. Opthalmology - Dr. Fransico Michael No hearing issues.  Wears hearing aids bilaterally. They have deferred audiologic testing in the last year.   They do not  have excessive sun exposure. Discussed the need for sun protection: hats, long sleeves and use of sunscreen if there is significant sun exposure. Dermatologist - Dr. Adolphus Birchwood  Diet: the importance of a healthy diet is discussed. They do have a relatively healthy diet.  The benefits of regular aerobic exercise were discussed. She has not been exercising.  Depression screen: there are no signs or vegative symptoms of depression- irritability, change in appetite, anhedonia, sadness/tearfullness.  Cognitive assessment: the patient manages all their financial and personal affairs and is actively engaged. They could relate day,date,year and events.  The following portions of the patient's history were reviewed and updated as appropriate: allergies, current  medications, past family history, past medical history,  past surgical history, past social history  and problem list.  Visual acuity was not assessed per patient preference since she has regular follow up with her ophthalmologist. Hearing and body mass index were assessed and reviewed.   During the course of the visit the patient was educated and counseled about appropriate screening and preventive services including : fall prevention , diabetes screening, nutrition counseling, colorectal cancer screening, and recommended immunizations.    Pt is concerned about recent bone density testing results. Reviewed Dexa scan today which showed T-score of 1.7. Pt has not had fractures. She is planning to start Prolia today.  In regards to recent left mastectomy, she continues to be concerned about cosmetic appearance. She denies any pain at the site.  Review of Systems  Constitutional: Negative for fever, chills, appetite change, fatigue and unexpected weight change.  HENT: Positive for rhinorrhea. Negative for congestion, ear pain, sinus pressure, sore throat, trouble swallowing and voice change.   Eyes: Negative for visual disturbance.  Respiratory: Negative for cough, shortness of breath, wheezing and stridor.   Cardiovascular: Negative for chest pain, palpitations and leg swelling.  Gastrointestinal: Negative for nausea, vomiting, abdominal pain, diarrhea, constipation, blood in stool, abdominal distention and anal bleeding.  Genitourinary: Negative for dysuria and flank pain.  Musculoskeletal: Negative for arthralgias, gait problem, myalgias and neck pain.  Skin: Negative for color change and rash.  Neurological: Negative for dizziness and headaches.  Hematological: Negative for adenopathy. Does not bruise/bleed easily.  Psychiatric/Behavioral: Negative for suicidal ideas, sleep disturbance and dysphoric mood.  The patient is not nervous/anxious.        Objective:   Physical Exam    Constitutional: She is oriented to person, place, and time. She appears well-developed and well-nourished. No distress.  HENT:  Head: Normocephalic and atraumatic.  Right Ear: External ear normal.  Left Ear: External ear normal.  Nose: Nose normal.  Mouth/Throat: Oropharynx is clear and moist. No oropharyngeal exudate.  Eyes: Conjunctivae are normal. Pupils are equal, round, and reactive to light. Right eye exhibits no discharge. Left eye exhibits no discharge. No scleral icterus.  Neck: Normal range of motion. Neck supple. No tracheal deviation present. No thyromegaly present.  Cardiovascular: Normal rate, regular rhythm, normal heart sounds and intact distal pulses.  Exam reveals no gallop and no friction rub.   No murmur heard. Pulmonary/Chest: Effort normal and breath sounds normal. No respiratory distress. She has no wheezes. She has no rales. She exhibits no tenderness. Right breast exhibits no inverted nipple, no mass, no nipple discharge, no skin change and no tenderness. Breasts are asymmetrical.    Abdominal: Soft. Bowel sounds are normal. She exhibits no distension and no mass. There is no tenderness. There is no rebound and no guarding.  Genitourinary: Rectum normal and uterus normal. No breast swelling, tenderness, discharge or bleeding. Pelvic exam was performed with patient supine. There is no rash, tenderness or lesion on the right labia. There is no rash, tenderness or lesion on the left labia. Uterus is not enlarged and not tender. Cervix exhibits friability. Cervix exhibits no motion tenderness and no discharge. Right adnexum displays no mass, no tenderness and no fullness. Left adnexum displays no mass, no tenderness and no fullness. There is erythema around the vagina. No tenderness around the vagina. No vaginal discharge found.  Atrophic vaginitis noted  Musculoskeletal: Normal range of motion. She exhibits no edema and no tenderness.  Lymphadenopathy:    She has no  cervical adenopathy.  Neurological: She is alert and oriented to person, place, and time. No cranial nerve deficit. She exhibits normal muscle tone. Coordination normal.  Skin: Skin is warm and dry. No rash noted. She is not diaphoretic. No erythema. No pallor.  Psychiatric: She has a normal mood and affect. Her behavior is normal. Judgment and thought content normal.          Assessment & Plan:

## 2013-05-20 LAB — COMPREHENSIVE METABOLIC PANEL
ALT: 11 U/L (ref 0–35)
AST: 17 U/L (ref 0–37)
Albumin: 4 g/dL (ref 3.5–5.2)
Alkaline Phosphatase: 44 U/L (ref 39–117)
BUN: 12 mg/dL (ref 6–23)
CO2: 28 mEq/L (ref 19–32)
Calcium: 9.7 mg/dL (ref 8.4–10.5)
Chloride: 102 mEq/L (ref 96–112)
Creat: 0.83 mg/dL (ref 0.50–1.10)
Glucose, Bld: 62 mg/dL — ABNORMAL LOW (ref 70–99)
Potassium: 4.4 mEq/L (ref 3.5–5.3)
Sodium: 141 mEq/L (ref 135–145)
Total Bilirubin: 0.7 mg/dL (ref 0.3–1.2)
Total Protein: 6.9 g/dL (ref 6.0–8.3)

## 2013-05-20 LAB — MICROALBUMIN / CREATININE URINE RATIO
Creatinine, Urine: 137.3 mg/dL
Microalb Creat Ratio: 5.5 mg/g (ref 0.0–30.0)
Microalb, Ur: 0.76 mg/dL (ref 0.00–1.89)

## 2013-05-20 LAB — LIPID PANEL
Cholesterol: 191 mg/dL (ref 0–200)
HDL: 51 mg/dL (ref >39)
LDL Cholesterol: 112 mg/dL — ABNORMAL HIGH (ref 0–99)
Total CHOL/HDL Ratio: 3.7 Ratio
Triglycerides: 140 mg/dL (ref <150)
VLDL: 28 mg/dL (ref 0–40)

## 2013-05-20 LAB — PROTIME-INR
INR: 2.25 — ABNORMAL HIGH (ref <1.50)
Prothrombin Time: 24.1 seconds — ABNORMAL HIGH (ref 11.6–15.2)

## 2013-05-20 NOTE — Assessment & Plan Note (Signed)
Reviewed recent bone density testing with pt. Will start Prolia today, plan for next dose 10/2013. Repeat bone density testing in 2016. Continue Vit D 2000units daily. Adequate dietary calcium. Encouraged physical activity such as walking.

## 2013-05-20 NOTE — Assessment & Plan Note (Signed)
General medical exam including breast and pelvic exam normal today except as noted. PAP pending. Mammogram scheduled for 06/2013.  Encouraged healthy diet and exercise such as walking, goal of 5 days per week. Immunizations are UTD. Appropriate screening performed.

## 2013-05-20 NOTE — Assessment & Plan Note (Signed)
BP Readings from Last 3 Encounters:  05/19/13 154/84  04/27/13 140/80  01/11/13 134/74   BP slightly elevated today, but generally has been well controlled. Will check renal function with labs today. Continue current medications.

## 2013-05-20 NOTE — Assessment & Plan Note (Signed)
INR therapeutic with labs today. Continue current dose coumadin. Repeat INR 1 month.

## 2013-05-22 ENCOUNTER — Encounter: Payer: Self-pay | Admitting: *Deleted

## 2013-05-23 ENCOUNTER — Other Ambulatory Visit: Payer: Self-pay | Admitting: *Deleted

## 2013-05-23 ENCOUNTER — Telehealth: Payer: Self-pay | Admitting: Internal Medicine

## 2013-05-23 DIAGNOSIS — J309 Allergic rhinitis, unspecified: Secondary | ICD-10-CM

## 2013-05-23 DIAGNOSIS — H6120 Impacted cerumen, unspecified ear: Secondary | ICD-10-CM | POA: Diagnosis not present

## 2013-05-23 DIAGNOSIS — H6690 Otitis media, unspecified, unspecified ear: Secondary | ICD-10-CM | POA: Diagnosis not present

## 2013-05-23 MED ORDER — FLUTICASONE PROPIONATE 50 MCG/ACT NA SUSP: 1.0000 | Freq: Every day | NASAL | Status: DC

## 2013-05-23 NOTE — Telephone Encounter (Signed)
Pt states she has been asked by [facility] to contact us for her Ziac.  States medication refills need to be written for 3 month supply due to [employer].  Says we need to call [phone] and fax new script to [phone].  Also asking for results of recent blood work.

## 2013-05-25 MED ORDER — BISOPROLOL-HYDROCHLOROTHIAZIDE 5-6.25 MG PO TABS: ORAL_TABLET | ORAL | Status: DC

## 2013-05-25 NOTE — Telephone Encounter (Signed)
Yes, her INR was perfect. Continue coumadin. Repeat INR in 1 month.

## 2013-05-25 NOTE — Telephone Encounter (Signed)
Prescription has been sent. Patient would like to know if she should continue taking the coumadin as she was before?

## 2013-05-26 NOTE — Telephone Encounter (Signed)
Left message to call back  

## 2013-05-29 NOTE — Telephone Encounter (Signed)
Patient informed and verbalized understanding

## 2013-06-05 DIAGNOSIS — D485 Neoplasm of uncertain behavior of skin: Secondary | ICD-10-CM | POA: Diagnosis not present

## 2013-06-05 DIAGNOSIS — L821 Other seborrheic keratosis: Secondary | ICD-10-CM | POA: Diagnosis not present

## 2013-06-05 DIAGNOSIS — Z85828 Personal history of other malignant neoplasm of skin: Secondary | ICD-10-CM | POA: Diagnosis not present

## 2013-06-20 DIAGNOSIS — L570 Actinic keratosis: Secondary | ICD-10-CM | POA: Diagnosis not present

## 2013-06-21 DIAGNOSIS — I2699 Other pulmonary embolism without acute cor pulmonale: Secondary | ICD-10-CM | POA: Diagnosis not present

## 2013-06-21 LAB — PROTIME-INR

## 2013-06-26 ENCOUNTER — Telehealth: Payer: Self-pay | Admitting: Internal Medicine

## 2013-06-26 NOTE — Telephone Encounter (Signed)
Coumadin level was slightly low with INR 1.9 on 11/26. Has pt been taking coumadin? Any missed doses?

## 2013-06-27 NOTE — Telephone Encounter (Signed)
Does she have 1mg  tablets? Can she alternate 3mg  and 4mg  daily and repeat INR in 1 week.

## 2013-06-27 NOTE — Telephone Encounter (Signed)
Spoke with patient, state she has not missed a dose. She take 3 mg every night, however she did change pharmacies. She is now using [facility], which is probably not the same manufacturer. This was about 6-8 weeks ago. And she did state she has been eating large chef salads a couple times.

## 2013-06-27 NOTE — Telephone Encounter (Signed)
Patient informed of the change but was very hesitant about doing this. Stated shouldn't she wait to make sure the medication from the different manufacturer would make a difference. Really do not want to use a local pharmacy because she can get it for free. Per patient she will just have another blood draw to see what those results are then let us know.

## 2013-06-27 NOTE — Telephone Encounter (Signed)
OK. Let's call in Warfarin 1mg  tablets, 3mg  Mon Wed Fri and 4mg  Tues, Thu, Sat, Sun #100 with 3 refills.

## 2013-06-27 NOTE — Telephone Encounter (Signed)
Spoke with patient, she has a 3 mg tablet and a new prescription would need to be sent to [facility] if necessary.

## 2013-06-28 MED ORDER — WARFARIN SODIUM 1 MG PO TABS: ORAL_TABLET | ORAL | Status: DC

## 2013-06-28 NOTE — Telephone Encounter (Signed)
Either way is fine. The 3mg  and 1mg  tablets might be easier

## 2013-06-28 NOTE — Telephone Encounter (Signed)
Would you like for her to take a 1 mg and 3 mg tablet to get 4 mg or should she take all 1 mg tablets to get the totals of alternating 3 mg and 4 mg

## 2013-06-28 NOTE — Telephone Encounter (Signed)
Spoke with patient informed her of new instructions for her coumadin, she verbalized understanding. Rx sent to [facility].

## 2013-06-28 NOTE — Telephone Encounter (Signed)
Pt called regarding change in medication.  Would like [place] to fax new rx [facility].  States she was told by mail order that we need to specify whether the pt will take all 1 mg tablets, or a 3 mg tablet and add a 1 mg tablet on days she needs 4 mg.  Pt would also like the script called in to local [facility] x 3 months, states she is willing to pay for this and would like it immediately so she can begin the new dose.  Pt would like a call to let her know this has been done and would like to pick up the rx this afternoon if at all possible.  States she does have #30 of the 3 mg tablets left and would like to finish those up by adding the 1 mg tablet for the days she needs 4 mg.  Please inform whether she will take #3 pills of 1 mg tablets to equal 3 mg and #4 pills of 1 mg tablets to equal 4 mg, and that it if okay to finish the 3 mg tablets by adding 1 mg tablet for 4 mg days.  Advised pt again which days she will take 3 mg and which days she will take 4 mg.  Pt apologizes if she was short with nurse during previous conversation about med change, she was feeling overwhelmed with the change.

## 2013-06-29 ENCOUNTER — Encounter: Payer: Self-pay | Admitting: General Surgery

## 2013-06-29 ENCOUNTER — Ambulatory Visit: Payer: Self-pay | Admitting: General Surgery

## 2013-06-29 DIAGNOSIS — Z853 Personal history of malignant neoplasm of breast: Secondary | ICD-10-CM | POA: Diagnosis not present

## 2013-06-29 DIAGNOSIS — R922 Inconclusive mammogram: Secondary | ICD-10-CM | POA: Diagnosis not present

## 2013-07-07 ENCOUNTER — Encounter: Payer: Self-pay | Admitting: Internal Medicine

## 2013-07-13 ENCOUNTER — Ambulatory Visit: Payer: Medicare Other | Admitting: General Surgery

## 2013-07-17 ENCOUNTER — Telehealth: Payer: Self-pay | Admitting: Internal Medicine

## 2013-07-17 DIAGNOSIS — Z7901 Long term (current) use of anticoagulants: Secondary | ICD-10-CM

## 2013-07-17 DIAGNOSIS — C50912 Malignant neoplasm of unspecified site of left female breast: Secondary | ICD-10-CM

## 2013-07-17 NOTE — Telephone Encounter (Signed)
ALPRAZolam (XANAX) 0.5 MG tablet  warfarin (COUMADIN) 3 MG tablet  letrozole (FEMARA) 2.5 MG tablet

## 2013-07-18 ENCOUNTER — Ambulatory Visit: Payer: Medicare Other | Admitting: General Surgery

## 2013-07-18 ENCOUNTER — Ambulatory Visit (INDEPENDENT_AMBULATORY_CARE_PROVIDER_SITE_OTHER): Payer: Medicare Other | Admitting: General Surgery

## 2013-07-18 ENCOUNTER — Encounter: Payer: Self-pay | Admitting: General Surgery

## 2013-07-18 VITALS — BP 120/72 | HR 76 | Resp 14 | Ht 60.0 in | Wt 156.0 lb

## 2013-07-18 DIAGNOSIS — C50919 Malignant neoplasm of unspecified site of unspecified female breast: Secondary | ICD-10-CM | POA: Diagnosis not present

## 2013-07-18 MED ORDER — LETROZOLE 2.5 MG PO TABS: ORAL_TABLET | ORAL | Status: DC

## 2013-07-18 MED ORDER — LETROZOLE 2.5 MG PO TABS: 2.5000 mg | ORAL_TABLET | Freq: Every day | ORAL | Status: DC

## 2013-07-18 MED ORDER — ALPRAZOLAM 0.5 MG PO TABS: ORAL_TABLET | ORAL | Status: DC

## 2013-07-18 MED ORDER — WARFARIN SODIUM 3 MG PO TABS: 3.0000 mg | ORAL_TABLET | Freq: Every day | ORAL | Status: DC

## 2013-07-18 NOTE — Patient Instructions (Signed)
Continue self breast exams. Call office for any new breast issues or concerns. If the left wrist continues to give her problems then she will call back and we can refer her to orthopedic physician.

## 2013-07-18 NOTE — Telephone Encounter (Signed)
Fine to refill 

## 2013-07-18 NOTE — Progress Notes (Signed)
Patient ID: [name], female   DOB: 12-27-1925, 79 y.o.   MRN: [id number]  Chief Complaint  Patient presents with  . Follow-up    mammogram    HPI [name] is a 79 y.o. female.  who presents for a follow up breast evaluation. The most recent right breast mammogram was done on 06-29-13.  Patient does perform regular self breast checks and gets regular mammograms done.  No new breast complaints. She is taking the Femara and feels like her hair is coming out and occasionally her left wrist will ache. No new breast issues.   HPI  Past Medical History  Diagnosis Date  . S/P IVC filter 2009  . Pulmonary embolus 2009  . Insomnia   . Allergy   . Hypertension   . Osteoporosis     Prolia 10/2012  . Cancer   . 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    Past Surgical History  Procedure Laterality Date  . Replacement total knee      right  . Cholecystectomy    . Hernia repair    . Bladder surgery    . Tonsillectomy    . Breast surgery Left 2013    mastectomy    Family History  Problem Relation Age of Onset  . COPD Mother   . Stroke Father   . Cancer Father     breast    Social History History  Substance Use Topics  . Smoking status: Former Smoker    Quit date: 05/13/1999  . Smokeless tobacco: Never Used  . Alcohol Use: No    Allergies  Allergen Reactions  . Erythromycin   . Sulfa Drugs Cross Reactors     rash    Current Outpatient Prescriptions  Medication Sig Dispense Refill  . bisoprolol-hydrochlorothiazide (ZIAC) 5-6.25 MG per tablet TAKE 1 TABLET BY MOUTH DAILY.  90 tablet  1  . Cholecalciferol (VITAMIN D PO) Take 2,000 mg by mouth daily.      . diphenoxylate-atropine (LOMOTIL) 2.5-0.025 MG per tablet Take 1 tablet by mouth 4 (four) times daily as needed for diarrhea or loose stools.  90 tablet  0  . fluticasone (FLONASE) 50 MCG/ACT  nasal spray Place 1 spray into the nose daily.  16 g  6  . montelukast (SINGULAIR) 10 MG tablet Take 1 tablet (10 mg total) by mouth at bedtime.  90 tablet  4  . warfarin (COUMADIN) 1 MG tablet Please take as directed by physician  100 tablet  3  . ALPRAZolam (XANAX) 0.5 MG tablet TAKE 1 TABLET EVERY NIGHT AT BEDTIME AS NEEDED FOR SLEEP OR ANXIETY  90 tablet  0  . letrozole (FEMARA) 2.5 MG tablet Take 1 tablet (2.5 mg total) by mouth daily.  90 tablet  3  . letrozole (FEMARA) 2.5 MG tablet TAKE 1 TABLET DAILY  90 tablet  3  . warfarin (COUMADIN) 3 MG tablet Take 1 tablet (3 mg total) by mouth daily.  90 tablet  1   No current facility-administered medications for this visit.    Review of Systems Review of Systems  Constitutional: Negative.   Respiratory: Negative.   Cardiovascular: Negative.     Blood pressure 120/72, pulse 76, resp. rate 14, height 5' (1.524 m), weight 156 lb (70.761 kg).  Physical Exam Physical Exam  Constitutional: She is oriented to person, place, and time. She appears  well-developed and well-nourished.  Eyes: No scleral icterus.  Neck: Neck supple.  Cardiovascular: Normal rate, regular rhythm and normal heart sounds.   Pulmonary/Chest: Effort normal and breath sounds normal. Right breast exhibits no inverted nipple, no mass, no nipple discharge, no skin change and no tenderness.    Left mastectomy site well healed.  Musculoskeletal:       Left wrist: She exhibits no tenderness, no swelling and no crepitus.  Left wrist no focal tenderness.  Lymphadenopathy:    She has no cervical adenopathy.    She has no axillary adenopathy.  Neurological: She is alert and oriented to person, place, and time.  Skin: Skin is warm and dry.  2 cm lipoma anterior proximal left forearm    Data Reviewed Mammogram dated June 29, 2013 of the right breast showed no abnormality. BI-RAD-1.  Assessment    Doing well one year status post left mastectomy for a T1 C.  Carcinoma.  Left wrist pain likely secondary to arthritis, intrinsic wrist pathology rather than Femara.    Plan    If the left wrist continues to give her problems then she will call back and we can refer her to orthopedic physician. One year follow up right diagnostic mammogram and office visit.        [name] 07/18/2013, 9:14 PM

## 2013-07-18 NOTE — Telephone Encounter (Signed)
Ok to refill 

## 2013-07-18 NOTE — Telephone Encounter (Signed)
Prescriptions sent to [facility] per patient request.

## 2013-07-22 DIAGNOSIS — I2699 Other pulmonary embolism without acute cor pulmonale: Secondary | ICD-10-CM | POA: Diagnosis not present

## 2013-07-22 LAB — PROTIME-INR

## 2013-07-24 ENCOUNTER — Telehealth: Payer: Self-pay | Admitting: Internal Medicine

## 2013-07-24 NOTE — Telephone Encounter (Signed)
The patient is wanting to know her PT/INR results and  what is her therapeutic dose of coumadin .

## 2013-07-25 ENCOUNTER — Telehealth: Payer: Self-pay | Admitting: *Deleted

## 2013-07-25 NOTE — Telephone Encounter (Signed)
Received patient results that was faxed today to [facility], her INR was 3.4. She is currently taking 3 mg for 3 days then 4 mr for days per patient. Also state she has been having some bleeding, patient is really concerned and would like to know how she should continue taking the coumadin.

## 2013-07-25 NOTE — Telephone Encounter (Signed)
She is having some spotting when she goes to the restroom. Thinks this may be due to her changing the brands of her coumadin. This is a high number for her and she began getting the high numbers once the brand was changed. She spoke with MD on call and he told her not to take any coumadin last night then call her PCP the next day to see what she needs to do.

## 2013-07-25 NOTE — Telephone Encounter (Signed)
The patient spoke with the physician on call last night because she saw some blood on her pad. The physician instructed her not to take her coumadin and call the office this morning. Please advise about her therapeutic dose of coumadin . Her PT/INR was draw on 12.27.14. I asked the patient to fax over her results.

## 2013-07-25 NOTE — Telephone Encounter (Signed)
Pt is coming in tomorrow 12.31.2014 what labs and dx? Is it just for a pt/inr?

## 2013-07-25 NOTE — Telephone Encounter (Signed)
Please have her hold the coumadin and we will need to recheck INR tomorrow. She will need to be seen an evaluated to determine source of bleeding. This week

## 2013-07-25 NOTE — Telephone Encounter (Signed)
Patient informed to hold the coumadin today and will come in tomorrow for her INR to be drawn her. Verbalized understanding before disconnecting the call.

## 2013-07-26 ENCOUNTER — Other Ambulatory Visit (INDEPENDENT_AMBULATORY_CARE_PROVIDER_SITE_OTHER): Payer: Medicare Other

## 2013-07-26 ENCOUNTER — Telehealth: Payer: Self-pay | Admitting: Internal Medicine

## 2013-07-26 DIAGNOSIS — Z7901 Long term (current) use of anticoagulants: Secondary | ICD-10-CM | POA: Diagnosis not present

## 2013-07-26 LAB — PROTIME-INR
INR: 2.7 ratio — ABNORMAL HIGH (ref 0.8–1.0)
Prothrombin Time: 27.8 s — ABNORMAL HIGH (ref 10.2–12.4)

## 2013-07-26 NOTE — Telephone Encounter (Signed)
Pt came in today for labs and wanted to know what she needed to do about her coumadin  She has not taken any for 2

## 2013-07-26 NOTE — Telephone Encounter (Signed)
Pt stated you can leave on answer machine

## 2013-07-26 NOTE — Telephone Encounter (Signed)
Yes, PT/INR

## 2013-07-26 NOTE — Telephone Encounter (Signed)
Could you please check her INR results so I may call her before I leave today

## 2013-07-28 NOTE — Telephone Encounter (Signed)
Please refer to the lab note for further information on this.

## 2013-07-31 DIAGNOSIS — I2699 Other pulmonary embolism without acute cor pulmonale: Secondary | ICD-10-CM | POA: Diagnosis not present

## 2013-07-31 LAB — PROTIME-INR

## 2013-08-01 ENCOUNTER — Telehealth: Payer: Self-pay | Admitting: Internal Medicine

## 2013-08-01 NOTE — Telephone Encounter (Signed)
Have you seen the results of her INR?

## 2013-08-01 NOTE — Telephone Encounter (Signed)
I have not seen an INR today.

## 2013-08-01 NOTE — Telephone Encounter (Signed)
The patient wants her PT/INR results and if she should adjust her coumadin .

## 2013-08-01 NOTE — Telephone Encounter (Signed)
INR was slightly low at 1.8. Can you have her start Coumadin 3mg  every day, except for Tue and Saturday, 4mg . We should repeat INR in 2 weeks.

## 2013-08-01 NOTE — Telephone Encounter (Signed)
They are on the ledge with your stack of faxes now.

## 2013-08-02 NOTE — Telephone Encounter (Signed)
Called and spoke with patient informed her of INR results, she verbalized understanding. Per patient she will start this today

## 2013-08-07 ENCOUNTER — Encounter: Payer: Self-pay | Admitting: General Surgery

## 2013-08-14 DIAGNOSIS — I2699 Other pulmonary embolism without acute cor pulmonale: Secondary | ICD-10-CM | POA: Diagnosis not present

## 2013-08-14 LAB — PROTIME-INR: INR: 3.2 — AB (ref 0.9–1.1)

## 2013-08-16 ENCOUNTER — Telehealth: Payer: Self-pay | Admitting: Internal Medicine

## 2013-08-16 NOTE — Telephone Encounter (Signed)
Pt called concerning coumadin.  Pt states she has been taking as previously instructed to include 4 mg two days a week.  Pt states she has taken no antibiotics since October.  Pt states that her coumadin level issues began in October and that is when she had a change in brand.  Pt feels sure that the brand change is what has caused her to have trouble with her PT levels.  Does not know if it would be more trouble to switch back to previous brand, or change dose again with the new medication she has been taking most recently.  Pt is very distressed about the medication.  She is willing to do whatever Dr. Gilford Rile wants her to do.  Pt asking for results and new instructions.

## 2013-08-16 NOTE — Telephone Encounter (Signed)
Lets have her take Coumadin 3mg  daily for next 2 weeks and then recheck INR.

## 2013-08-16 NOTE — Telephone Encounter (Signed)
INR from 08/14/2013 was 3.2 Can you please confirm dose of coumadin? Any recent antibiotic use?

## 2013-08-16 NOTE — Telephone Encounter (Signed)
Patient is taking coumadin as instructed from last results, 4 mg two days and 3 mg all other days. Has not been on any antibiotics and has not been eating a lot of green leafy vegetables. She really does feel like these changes in her levels are due to her using a different brand of coumadin. The 1 mg tablet and 3 mg tablets are from different manufacturers.

## 2013-08-16 NOTE — Telephone Encounter (Signed)
Spoke with patient and informed her of the new dosage instructions, verbalized understanding.

## 2013-08-18 ENCOUNTER — Encounter: Payer: Self-pay | Admitting: Internal Medicine

## 2013-08-30 ENCOUNTER — Telehealth: Payer: Self-pay | Admitting: Internal Medicine

## 2013-08-30 DIAGNOSIS — I2699 Other pulmonary embolism without acute cor pulmonale: Secondary | ICD-10-CM | POA: Diagnosis not present

## 2013-08-30 LAB — PROTIME-INR: INR: 10 — AB (ref 0.9–1.1)

## 2013-08-30 NOTE — Telephone Encounter (Signed)
The patient has had her PT/INR done at [facility] on  2.3.15 and she would like someone to give her a call as to how she should regulate her coumadin after you receive her results.

## 2013-08-30 NOTE — Telephone Encounter (Signed)
Spoke with patient she had this drawn this morning. Informed her we have not received those results yet but as soon as we do we will give her a call.

## 2013-08-31 ENCOUNTER — Other Ambulatory Visit (INDEPENDENT_AMBULATORY_CARE_PROVIDER_SITE_OTHER): Payer: Medicare Other

## 2013-08-31 DIAGNOSIS — Z7901 Long term (current) use of anticoagulants: Secondary | ICD-10-CM

## 2013-08-31 LAB — PROTIME-INR
INR: 2.6 ratio — ABNORMAL HIGH (ref 0.8–1.0)
Prothrombin Time: 26.5 s — ABNORMAL HIGH (ref 10.2–12.4)

## 2013-08-31 NOTE — Telephone Encounter (Signed)
She must STOP her Warfarin. Has she had any bleeding or bruising? Has she been on any new medications? She should have repeat INR today in our office. If ANY bleeding or bruising, we will need to try to reverse her

## 2013-08-31 NOTE — Telephone Encounter (Signed)
Spoke with patient, she has not had any changes in her medication or diet. She has been taking her medication as instructed, she is concerned about those numbers also. Patient coming in today to be rechecked in our lab.

## 2013-08-31 NOTE — Telephone Encounter (Signed)
Please refer to lab results for further information

## 2013-08-31 NOTE — Telephone Encounter (Signed)
Received a call from the nurse at the [facility] in reference to INR results she faxed over this morning. Patient INR was critically low and she has a dentist appointment today as well. Please advise on this patient, results on ledge.

## 2013-09-01 ENCOUNTER — Ambulatory Visit: Payer: Medicare Other | Admitting: Internal Medicine

## 2013-09-04 ENCOUNTER — Telehealth: Payer: Self-pay | Admitting: Internal Medicine

## 2013-09-04 NOTE — Telephone Encounter (Addendum)
Will this be ok? If so, I will have it ready for signature tomorrow

## 2013-09-04 NOTE — Telephone Encounter (Signed)
That is fine 

## 2013-09-04 NOTE — Telephone Encounter (Signed)
Pt calling.  States [facility] needs Dr. Gilford Rile to send a standing order for blood work to check patient's coumadin levels as needed.  Asking that this have the patient's name on it, and place standing order for 6 months to 1 year if possible.  States Dr. Gilford Rile can add any other blood work to the order, but they need to be able to check pt Coumadin levels.

## 2013-09-04 NOTE — Telephone Encounter (Signed)
Written to be faxed to [phone]

## 2013-09-06 ENCOUNTER — Telehealth: Payer: Self-pay | Admitting: Internal Medicine

## 2013-09-06 DIAGNOSIS — B379 Candidiasis, unspecified: Secondary | ICD-10-CM | POA: Diagnosis not present

## 2013-09-06 NOTE — Telephone Encounter (Signed)
The patient is needing her labs sent to the [facility] Elane Fritz and a copy for herself .

## 2013-09-06 NOTE — Telephone Encounter (Signed)
Printed and faxed

## 2013-09-07 ENCOUNTER — Encounter: Payer: Self-pay | Admitting: Internal Medicine

## 2013-09-08 ENCOUNTER — Telehealth: Payer: Self-pay | Admitting: Internal Medicine

## 2013-09-08 NOTE — Telephone Encounter (Signed)
She had one erroneous coumadin level, which was most likely lab error, as repeat level was consistent with previous. Her levels have been therapeutic. There is no reason she cannot proceed with dental work. If the dentist feels that she needs to hold Coumadin for her procedure, she should let us know.

## 2013-09-08 NOTE — Telephone Encounter (Signed)
Please advise and I will call on Monday

## 2013-09-08 NOTE — Telephone Encounter (Signed)
Supposed to have 2 implants/transplants done by her dentist.  Called dentist this morning due to some soreness under tooth and gum area.  States her dentist told her that she may not be able to have the implants done now due to the recent coumadin/blood work issues.  States the dentist needs to know if or how long she has to be off of the Coumadin before he can do anything.  Pt is ready to start procedures.  Pt would also like her recent blood results.  Pt states we need to talk directly to, or send the info directly to the dentist:    Dr. Albesa Seen    [facility] [address], [place]  Ph:  [phone] Fx:  [phone]  Would like for Dr. Gilford Rile to share any information with the dentist that she feels he should be aware of.  Pt would like a call to let her know that this has been done.  She is very stressed about this.

## 2013-09-11 NOTE — Telephone Encounter (Signed)
Information has been printed and faxed to dentist office per patient request.

## 2013-09-24 DIAGNOSIS — I2699 Other pulmonary embolism without acute cor pulmonale: Secondary | ICD-10-CM | POA: Diagnosis not present

## 2013-09-24 LAB — PROTIME-INR

## 2013-09-26 ENCOUNTER — Telehealth: Payer: Self-pay | Admitting: Internal Medicine

## 2013-09-26 NOTE — Telephone Encounter (Signed)
3/1 INR was 1.7. I would recommend continue Coumadin at same dose and repeat INR next week.

## 2013-09-26 NOTE — Telephone Encounter (Signed)
Patient returned and was given INR results. Verbalized understanding and instructions for coumadin.

## 2013-09-26 NOTE — Telephone Encounter (Signed)
Left message to return call 

## 2013-09-27 ENCOUNTER — Telehealth: Payer: Self-pay | Admitting: *Deleted

## 2013-09-27 NOTE — Telephone Encounter (Signed)
Left detailed message on patient voicemail with information listed below. Patient may or may not return call.

## 2013-09-27 NOTE — Telephone Encounter (Signed)
The level was just slightly below therapeutic range. I wanted to recheck to make sure not continuing to decrease.

## 2013-09-27 NOTE — Telephone Encounter (Signed)
Patient would like to know if there was anything wrong the reason she is testing her INR again next week. She is a little concerned about this.

## 2013-10-01 DIAGNOSIS — I2699 Other pulmonary embolism without acute cor pulmonale: Secondary | ICD-10-CM | POA: Diagnosis not present

## 2013-10-01 LAB — PROTIME-INR

## 2013-10-02 NOTE — Telephone Encounter (Signed)
Patient never returned call, encounter closed 

## 2013-10-03 ENCOUNTER — Telehealth: Payer: Self-pay | Admitting: Internal Medicine

## 2013-10-03 NOTE — Telephone Encounter (Signed)
INR was 1.9. Please have pt continue coumadin same dose and repeat INR 1 month.

## 2013-10-03 NOTE — Telephone Encounter (Signed)
Called patient and informed her of results, patient agreed and verbalized understanding.

## 2013-10-03 NOTE — Telephone Encounter (Signed)
Patient stated she received a reminder card in the mail, it is time for her Prolia injection. Have this been ordered or need to be ordered. Informed her she would receive a call to let her know.

## 2013-10-04 ENCOUNTER — Encounter: Payer: Self-pay | Admitting: Internal Medicine

## 2013-10-04 NOTE — Telephone Encounter (Signed)
Butch Penny, are you responsible for getting insurance ok for Prolia injections?

## 2013-10-04 NOTE — Telephone Encounter (Signed)
Not due until 11/17/13

## 2013-10-04 NOTE — Telephone Encounter (Signed)
Yes, an email has been sent to [facility] for authorization.  Waiting on response from insurance.

## 2013-10-04 NOTE — Telephone Encounter (Signed)
No Prolia ordered. Probably needs insurance ok first

## 2013-10-06 ENCOUNTER — Telehealth: Payer: Self-pay | Admitting: Internal Medicine

## 2013-10-06 NOTE — Telephone Encounter (Signed)
Insurance approval has been received for Prolia.  Pt will be contacted to schedule Prolia injection on or after 11/20/2013.

## 2013-10-12 NOTE — Telephone Encounter (Signed)
Noted  

## 2013-10-16 ENCOUNTER — Other Ambulatory Visit: Payer: Self-pay | Admitting: Internal Medicine

## 2013-10-16 ENCOUNTER — Telehealth: Payer: Self-pay | Admitting: Internal Medicine

## 2013-10-16 NOTE — Telephone Encounter (Signed)
Prolia approval received.  Left msg for pt to call back to schedule Prolia injection/nurse visit.  Appt must be after 4/27.  Please notify Butch Penny when appt has been made.

## 2013-10-19 NOTE — Telephone Encounter (Signed)
Patient has returned call and appointment was scheduled to get injection.

## 2013-10-23 DIAGNOSIS — I2699 Other pulmonary embolism without acute cor pulmonale: Secondary | ICD-10-CM | POA: Diagnosis not present

## 2013-10-24 ENCOUNTER — Telehealth: Payer: Self-pay | Admitting: Internal Medicine

## 2013-10-24 LAB — PROTIME-INR

## 2013-10-24 NOTE — Telephone Encounter (Signed)
INR was 2.2 on 3/30 Continue coumadin same dose. Repeat INR in 1 month.

## 2013-10-25 NOTE — Telephone Encounter (Signed)
Left message, notifying of results.  

## 2013-10-26 ENCOUNTER — Other Ambulatory Visit: Payer: Medicare Other

## 2013-11-10 ENCOUNTER — Other Ambulatory Visit: Payer: Self-pay | Admitting: Internal Medicine

## 2013-11-10 MED ORDER — ALPRAZOLAM 0.5 MG PO TABS: ORAL_TABLET | ORAL | Status: DC

## 2013-11-10 NOTE — Telephone Encounter (Signed)
Rx faxed to pharmacy  

## 2013-11-10 NOTE — Telephone Encounter (Signed)
Ok refill? 

## 2013-11-10 NOTE — Telephone Encounter (Signed)
Xanax refill needed x3 month supply [facility].  Pt states she only wants what has been prescribed.

## 2013-11-21 ENCOUNTER — Ambulatory Visit (INDEPENDENT_AMBULATORY_CARE_PROVIDER_SITE_OTHER): Payer: Medicare Other | Admitting: *Deleted

## 2013-11-21 ENCOUNTER — Other Ambulatory Visit: Payer: Self-pay | Admitting: *Deleted

## 2013-11-21 DIAGNOSIS — M899 Disorder of bone, unspecified: Secondary | ICD-10-CM

## 2013-11-21 DIAGNOSIS — Z7901 Long term (current) use of anticoagulants: Secondary | ICD-10-CM

## 2013-11-21 DIAGNOSIS — M949 Disorder of cartilage, unspecified: Principal | ICD-10-CM

## 2013-11-21 LAB — PROTIME-INR
INR: 2.1 ratio — ABNORMAL HIGH (ref 0.8–1.0)
Prothrombin Time: 22.5 s — ABNORMAL HIGH (ref 9.6–13.1)

## 2013-11-21 MED ORDER — DENOSUMAB 60 MG/ML ~~LOC~~ SOLN
60.0000 mg | Freq: Once | SUBCUTANEOUS | Status: AC
  Administered 2013-11-21: 60 mg via SUBCUTANEOUS

## 2013-11-21 NOTE — Addendum Note (Signed)
Addended by: Karlene Einstein D on: 11/21/2013 12:25 PM   Modules accepted: Orders

## 2013-11-21 NOTE — Progress Notes (Signed)
Pt here for Prolia, due for PT/INR at [facility] this week, requested to have labs drawn here since she was already in office. Orders entered.

## 2013-12-01 ENCOUNTER — Telehealth: Payer: Self-pay | Admitting: Internal Medicine

## 2013-12-14 ENCOUNTER — Telehealth: Payer: Self-pay | Admitting: Internal Medicine

## 2013-12-14 DIAGNOSIS — I2699 Other pulmonary embolism without acute cor pulmonale: Secondary | ICD-10-CM | POA: Diagnosis not present

## 2013-12-14 LAB — PROTIME-INR

## 2013-12-14 NOTE — Telephone Encounter (Signed)
Fine for her to stop coumadin for 5 days

## 2013-12-14 NOTE — Telephone Encounter (Signed)
Pt scheduled to have dental implants 5/29.  States dentist, Conception Chancy, wants her to stop Coumadin x5 days prior.  Pt will stop Coumadin 5/25 - 5/29.  Pt asking Dr. Gilford Rile to contact Dr. Lynelle Smoke if this is a problem.  States Dr. Lynelle Smoke has prescribed Tylenol with codeine.  Pt also states she had her monthly blood work done today, 5/21.  States Dr. Gilford Rile should receive a copy of results.

## 2013-12-15 ENCOUNTER — Telehealth: Payer: Self-pay | Admitting: Internal Medicine

## 2013-12-15 NOTE — Telephone Encounter (Signed)
INR was 1.7. Can you confirm with pt dose of coumadin that she is taking?

## 2013-12-15 NOTE — Telephone Encounter (Signed)
Pt notified and verbalized understanding.

## 2013-12-15 NOTE — Telephone Encounter (Signed)
OK. Have her continue the 3mg  daily of coumadin for now, given that she will stop the coumadin soon. Then, when she restarts, I would like to have her take 4mg  daily of coumadin. We will need to recheck INR 1 week after she starts back at this dose.

## 2013-12-15 NOTE — Telephone Encounter (Signed)
Pt states she is taking 3mg  coumadin, pt states that she is to be having dental surgery next Friday, 12/22/13, and will need to be off the coumadin for 5 days

## 2013-12-29 DIAGNOSIS — I2699 Other pulmonary embolism without acute cor pulmonale: Secondary | ICD-10-CM | POA: Diagnosis not present

## 2013-12-29 LAB — PROTIME-INR

## 2014-01-01 ENCOUNTER — Telehealth: Payer: Self-pay | Admitting: *Deleted

## 2014-01-01 ENCOUNTER — Telehealth: Payer: Self-pay | Admitting: Internal Medicine

## 2014-01-01 NOTE — Telephone Encounter (Signed)
Notified pt, scheduled lab appt for this week

## 2014-01-01 NOTE — Telephone Encounter (Signed)
INR 6/5 was 3. I would recommend repeat INR this week in our office, as we have had some problems with accuracy of INR on labs collected at [facility]. For now, I would continue same dose coumadin.

## 2014-01-01 NOTE — Telephone Encounter (Signed)
Pt is worried that she is not being seen enough throughout the year, she had breast cancer in the past, had a mammogram in December 2014 and doesn't suppose to have another one till December 2015. She has some Protocol questions about this and would like to talk to you about it.

## 2014-01-02 NOTE — Telephone Encounter (Signed)
Pt states she spoke with mail order pharmacy and was told that they have contacted [place] for refill on xanax.  Pt states she was instructed to tell us to make sure script says 3 month supply.

## 2014-01-03 NOTE — Telephone Encounter (Signed)
Form was faxed back to pharmacy on 01/02/14

## 2014-01-03 NOTE — Telephone Encounter (Signed)
Arrange f/u appt to address concerns.

## 2014-01-03 NOTE — Telephone Encounter (Signed)
Had some questions about Femera and side effects. She is having some problems with hair loss.

## 2014-01-05 ENCOUNTER — Other Ambulatory Visit: Payer: Medicare Other

## 2014-01-06 DIAGNOSIS — I2699 Other pulmonary embolism without acute cor pulmonale: Secondary | ICD-10-CM | POA: Diagnosis not present

## 2014-01-06 LAB — PROTIME-INR

## 2014-01-08 ENCOUNTER — Telehealth: Payer: Self-pay | Admitting: Internal Medicine

## 2014-01-08 ENCOUNTER — Encounter: Payer: Self-pay | Admitting: Internal Medicine

## 2014-01-08 NOTE — Telephone Encounter (Signed)
Notified pt. 

## 2014-01-08 NOTE — Telephone Encounter (Signed)
Left vm notifying pt

## 2014-01-08 NOTE — Telephone Encounter (Signed)
Spoke with pt, see other phone note.

## 2014-01-08 NOTE — Telephone Encounter (Signed)
Continue at 4mg  or she can go back to 3mg , the dose that she was at prior to the dental surgery

## 2014-01-08 NOTE — Telephone Encounter (Signed)
INR level on 6/13 was 3. Please have pt continue same dose of coumadin and repeat INR in 1 month.

## 2014-01-08 NOTE — Telephone Encounter (Signed)
We can go back to 3mg , but then I would recommend recheck INR in 1 week.

## 2014-01-08 NOTE — Telephone Encounter (Signed)
Pt states she came in on Friday for lab work but the lab tech was unable to draw her blood.  States she has to pay $25.00 for transportation.  Pt is upset as this is the second time she has had to pay for transportation to the office with no service completed.  Pt wants [place]  know that she has to pay for transportation.  Pt states she asked to stay in the lobby and drink fluids to hydrate herself for lab to try again; however, pt was told no, that it was lunch time.  Pt not pleased with service.  Pt also asking which dose of Coumadin she should continue, 3 mg or 4 mg daily?

## 2014-01-09 ENCOUNTER — Other Ambulatory Visit: Payer: Medicare Other

## 2014-01-12 NOTE — Telephone Encounter (Signed)
Pt calling to check status of $25.00 fee charged.  Asking to speak with office manager.  Advised she is out of office.

## 2014-01-15 DIAGNOSIS — Z7901 Long term (current) use of anticoagulants: Secondary | ICD-10-CM | POA: Diagnosis not present

## 2014-01-15 LAB — PROTIME-INR

## 2014-01-16 ENCOUNTER — Telehealth: Payer: Self-pay | Admitting: Internal Medicine

## 2014-01-16 NOTE — Telephone Encounter (Signed)
INR 2.0 01/15/14.  Coumadin was just adjusted to 3mg  q day.  Since just made adjustment, continue same coumadin dose and recheck pt/inr in 7-10 days to confirm remaining stable.  Show to Dr Gilford Rile.

## 2014-01-16 NOTE — Telephone Encounter (Signed)
Notified pt. 

## 2014-01-24 DIAGNOSIS — Z7901 Long term (current) use of anticoagulants: Secondary | ICD-10-CM | POA: Diagnosis not present

## 2014-01-25 DIAGNOSIS — I872 Venous insufficiency (chronic) (peripheral): Secondary | ICD-10-CM | POA: Diagnosis not present

## 2014-01-25 DIAGNOSIS — I87099 Postthrombotic syndrome with other complications of unspecified lower extremity: Secondary | ICD-10-CM | POA: Diagnosis not present

## 2014-01-30 ENCOUNTER — Telehealth: Payer: Self-pay | Admitting: Internal Medicine

## 2014-01-30 NOTE — Telephone Encounter (Signed)
[facility] calling to get instruction on whether pt needs INR checked.

## 2014-01-30 NOTE — Telephone Encounter (Signed)
Please advise INR check intervals for [name] at [facility].

## 2014-01-31 DIAGNOSIS — H04129 Dry eye syndrome of unspecified lacrimal gland: Secondary | ICD-10-CM | POA: Diagnosis not present

## 2014-01-31 NOTE — Telephone Encounter (Signed)
Checks of INR should be once monthly, unless we encounter abnormal levels.e

## 2014-02-03 ENCOUNTER — Other Ambulatory Visit: Payer: Self-pay | Admitting: Internal Medicine

## 2014-02-05 NOTE — Telephone Encounter (Signed)
Notified [facility]

## 2014-02-06 ENCOUNTER — Encounter: Payer: Self-pay | Admitting: Internal Medicine

## 2014-03-01 ENCOUNTER — Ambulatory Visit (INDEPENDENT_AMBULATORY_CARE_PROVIDER_SITE_OTHER): Payer: Medicare Other | Admitting: Adult Health

## 2014-03-01 ENCOUNTER — Encounter: Payer: Self-pay | Admitting: Adult Health

## 2014-03-01 ENCOUNTER — Other Ambulatory Visit: Payer: Self-pay | Admitting: *Deleted

## 2014-03-01 VITALS — BP 134/84 | HR 70 | Temp 97.5°F | Resp 14 | Ht 60.0 in | Wt 147.0 lb

## 2014-03-01 DIAGNOSIS — Z7901 Long term (current) use of anticoagulants: Secondary | ICD-10-CM

## 2014-03-01 DIAGNOSIS — R229 Localized swelling, mass and lump, unspecified: Secondary | ICD-10-CM | POA: Diagnosis not present

## 2014-03-01 DIAGNOSIS — H9202 Otalgia, left ear: Secondary | ICD-10-CM

## 2014-03-01 DIAGNOSIS — Z79899 Other long term (current) drug therapy: Secondary | ICD-10-CM | POA: Diagnosis not present

## 2014-03-01 DIAGNOSIS — M25519 Pain in unspecified shoulder: Secondary | ICD-10-CM

## 2014-03-01 DIAGNOSIS — H9209 Otalgia, unspecified ear: Secondary | ICD-10-CM | POA: Diagnosis not present

## 2014-03-01 DIAGNOSIS — M25512 Pain in left shoulder: Secondary | ICD-10-CM | POA: Insufficient documentation

## 2014-03-01 DIAGNOSIS — R2230 Localized swelling, mass and lump, unspecified upper limb: Secondary | ICD-10-CM | POA: Insufficient documentation

## 2014-03-01 DIAGNOSIS — R2231 Localized swelling, mass and lump, right upper limb: Secondary | ICD-10-CM

## 2014-03-01 LAB — PROTIME-INR
INR: 2.2 ratio — ABNORMAL HIGH (ref 0.8–1.0)
Prothrombin Time: 24 s — ABNORMAL HIGH (ref 9.6–13.1)

## 2014-03-01 NOTE — Progress Notes (Signed)
Pre visit review using our clinic review tool, if applicable. No additional management support is needed unless otherwise documented below in the visit note. 

## 2014-03-01 NOTE — Progress Notes (Signed)
Patient ID: [name], female   DOB: 10-02-1925, 80 y.o.   MRN: [id number]   Subjective:    Patient ID: [name], female    DOB: 02-18-1926, 80 y.o.   MRN: [id number]  HPI  Pt is a pleasant 80 y/o female who presents to clinic with several concerns:  1) Reports that pharmacy sent her a different manufacturer coumadin pill and she cannot take. She reports she can only take the manufacturer [place]. She would like a new prescription sent. She reports that any other manufacturer alters her levels.  2) left shoulder pain. She reports reaching in a cabinet and felt a pop. She reports having pain in the back of her shoulder and in her arm. She has taken some left over hydrocodone that she had for the past 2 nights just so that she could sleep.  3) Left ear feels full. She wears a hearing aid and wonders if it is stopped up with cerumen. No pain.  4) She has felt a lump under her right arm that she would like evaluated. Hx of breast cancer.  Past Medical History  Diagnosis Date  . S/P IVC filter 2009  . Pulmonary embolus 2009  . Insomnia   . Allergy   . Hypertension   . Osteoporosis     Prolia 10/2012  . Cancer   . 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    Current Outpatient Prescriptions on File Prior to Visit  Medication Sig Dispense Refill  . ALPRAZolam (XANAX) 0.5 MG tablet TAKE 1 TABLET EVERY NIGHT AT BEDTIME AS NEEDED FOR SLEEP OR ANXIETY  90 tablet  0  . Cholecalciferol (VITAMIN D PO) Take 2,000 mg by mouth daily.      . diphenoxylate-atropine (LOMOTIL) 2.5-0.025 MG per tablet Take 1 tablet by mouth 4 (four) times daily as needed for diarrhea or loose stools.  90 tablet  0  . fluticasone (FLONASE) 50 MCG/ACT nasal spray Place 1 spray into the nose daily.  16 g  6  . letrozole (FEMARA) 2.5 MG tablet Take 1 tablet (2.5 mg total) by mouth daily.  90 tablet   3  . letrozole (FEMARA) 2.5 MG tablet TAKE 1 TABLET DAILY  90 tablet  3  . montelukast (SINGULAIR) 10 MG tablet Take 1 tablet (10 mg total) by mouth at bedtime.  90 tablet  4  . warfarin (COUMADIN) 3 MG tablet TAKE 1 TABLET DAILY  90 tablet  0  . ZIAC 5-6.25 MG per tablet TAKE 1 TABLET DAILY  90 tablet  1  . warfarin (COUMADIN) 1 MG tablet Please take as directed by physician  100 tablet  3   No current facility-administered medications on file prior to visit.     Review of Systems  HENT: Positive for ear pain (right ear feels full). Negative for ear discharge.   Musculoskeletal: Positive for arthralgias. Negative for joint swelling and neck pain.  All other systems reviewed and are negative.      Objective:  BP 134/84  Pulse 70  Temp(Src) 97.5 F (36.4 C) (Oral)  Resp 14  Ht 5' (1.524 m)  Wt 147 lb (66.679 kg)  BMI 28.71 kg/m2  SpO2 95%   Physical Exam  Constitutional: She is oriented to person, place, and time. No distress.  HENT:  Head: Normocephalic and atraumatic.  Right Ear: External ear normal.  Left  ear canal is slightly red. No s/s infection. No drainage. TM normal  Cardiovascular: Normal rate and regular rhythm.   Pulmonary/Chest: Effort normal. No respiratory distress.    Musculoskeletal: Normal range of motion.  Neurological: She is alert and oriented to person, place, and time.  Skin: Skin is warm and dry.  Psychiatric: She has a normal mood and affect. Her behavior is normal. Judgment and thought content normal.      Assessment & Plan:   1. Medication management Will send in a new prescription to her pharmacy for correct manufacturer per pt request. I explained that pharmacies often change manufacturer and has nothing to do with the order we sent. We have sent the exact order. I will place a separate note to pharmacy telling them that you need a specific manufacturer ([place])  2. Left shoulder pain Immobilize with sling to rest arm. Ice and may  alternate with heat. Tylenol for pain. If no improvement within 1-2 weeks, consider physical therapy.  3. Left ear pain I have asked her to keep the left hearing aid out of the ear for a few days to see if this improves her symptoms. There is no s/s of infection. RTC if no improvement.  4. Lump in armpit, right Hx of left breast cancer. I am sending her to Dr. Bary Castilla to evaluate this area under her right arm. Suspect this is lipoma but would like it evaluated given her hx. - Ambulatory referral to General Surgery

## 2014-03-01 NOTE — Patient Instructions (Addendum)
  I will send in a new order for your coumadin (manufacturer [place]) per your request to your pharmacy today.  For your shoulder, we will place your left arm in a sling to rest. Apply ice for 20 min and do 3-4 times a day. Take tylenol 1 tablet every 6 hours for pain. If you are not any better in 1-2 weeks we will consider sending you to physical therapy.  Your left ear canal is slightly red without any evidence of infection. I believe your hearing aid may be irritating your ear. Keep it out for a few days to see if this helps.  I am referring you to Dr. Bary Castilla to evaluate the area under your right arm.

## 2014-03-09 ENCOUNTER — Other Ambulatory Visit: Payer: Self-pay | Admitting: Internal Medicine

## 2014-03-13 ENCOUNTER — Other Ambulatory Visit: Payer: Medicare Other

## 2014-03-13 ENCOUNTER — Encounter: Payer: Self-pay | Admitting: General Surgery

## 2014-03-13 ENCOUNTER — Ambulatory Visit (INDEPENDENT_AMBULATORY_CARE_PROVIDER_SITE_OTHER): Payer: Medicare Other | Admitting: General Surgery

## 2014-03-13 VITALS — BP 142/72 | HR 72 | Resp 14 | Ht 60.0 in | Wt 148.0 lb

## 2014-03-13 DIAGNOSIS — R2231 Localized swelling, mass and lump, right upper limb: Secondary | ICD-10-CM

## 2014-03-13 DIAGNOSIS — R229 Localized swelling, mass and lump, unspecified: Secondary | ICD-10-CM

## 2014-03-13 NOTE — Patient Instructions (Signed)
Continue self breast exams. Call office for any new breast issues or concerns. 

## 2014-03-13 NOTE — Progress Notes (Signed)
Patient ID: [name], female   DOB: [birth year], 80 y.o.   MRN: [id number]  Chief Complaint  Patient presents with  . Other    HPI [name] is a 80 y.o. female.  She states she was reaching up in a cabinet ( week of August 6) and she felt a "pop" in her shoulder and that night during her shower she found a lump under her right arm, axilla area. She does not think it has changed in size. Denies pain.  She states she has been using a sling for the left arm because she hurt her left shoulder in the past. She has been using ice and heat to her shoulder as well.  The patient has otherwise felt well. Except for tenderness with direct pressure to the lesion of the right axilla she is asymptomatic.   HPI  Past Medical History  Diagnosis Date  . S/P IVC filter 2009  . Pulmonary embolus 2009  . Insomnia   . Allergy   . Hypertension   . Osteoporosis     Prolia 10/2012  . Cancer   . 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    Past Surgical History  Procedure Laterality Date  . Replacement total knee      right  . Cholecystectomy    . Hernia repair    . Bladder surgery    . Tonsillectomy    . Breast surgery Left 2013    mastectomy    Family History  Problem Relation Age of Onset  . COPD Mother   . Stroke Father   . Cancer Father     breast    Social History History  Substance Use Topics  . Smoking status: Former Smoker    Quit date: 05/13/1999  . Smokeless tobacco: Never Used  . Alcohol Use: No    Allergies  Allergen Reactions  . Erythromycin   . Sulfa Drugs Cross Reactors     rash    Current Outpatient Prescriptions  Medication Sig Dispense Refill  . ALPRAZolam (XANAX) 0.5 MG tablet TAKE 1 TABLET EVERY NIGHT AT BEDTIME AS NEEDED FOR SLEEP OR ANXIETY  90 tablet  0  . Biotin 10 MG TABS Take by mouth.      . bisoprolol-hydrochlorothiazide  (ZIAC) 5-6.25 MG per tablet TAKE 1 TABLET DAILY  90 tablet  0  . Cholecalciferol (VITAMIN D PO) Take 2,000 mg by mouth daily.      . diphenoxylate-atropine (LOMOTIL) 2.5-0.025 MG per tablet Take 1 tablet by mouth 4 (four) times daily as needed for diarrhea or loose stools.  90 tablet  0  . fluticasone (FLONASE) 50 MCG/ACT nasal spray Place 1 spray into the nose daily.  16 g  6  . letrozole (FEMARA) 2.5 MG tablet Take 1 tablet (2.5 mg total) by mouth daily.  90 tablet  3  . letrozole (FEMARA) 2.5 MG tablet TAKE 1 TABLET DAILY  90 tablet  3  . montelukast (SINGULAIR) 10 MG tablet Take 1 tablet (10 mg total) by mouth at bedtime.  90 tablet  4  . warfarin (COUMADIN) 1 MG tablet Please take as directed by physician  100 tablet  3  . warfarin (COUMADIN) 3 MG tablet TAKE 1 TABLET DAILY  90 tablet  0   No current facility-administered medications for this visit.    Review of Systems Review of Systems  Constitutional: Negative.   Cardiovascular: Negative.   Gastrointestinal: Negative.     Blood pressure 142/72, pulse 72, resp. rate 14, height 5' (1.524 m), weight 148 lb (67.132 kg).  Physical Exam Physical Exam  Constitutional: She is oriented to person, place, and time. She appears well-developed and well-nourished.  Neck: Neck supple.  Cardiovascular: Normal rate, regular rhythm and normal heart sounds.   Pulmonary/Chest: Effort normal and breath sounds normal. Right breast exhibits no inverted nipple, no mass, no nipple discharge, no skin change and no tenderness.    Left mastectomy site well healed.  Lymphadenopathy:    She has no cervical adenopathy.    She has axillary adenopathy.  4-5 cm soft tissue mass right axilla  Neurological: She is alert and oriented to person, place, and time.  Skin: Skin is warm and dry.    Data Reviewed Ultrasound examination of the right axilla suggested a 3.2 x 3.4 x 3.5 cm isoechoic area similar to the adjacent muscle in the midportion of the  axilla. Visualized vessels were normal. No increased vascular flow was noted in the area. No lymphadenopathy appreciated.  Assessment    Right axillary mass.     Plan    The area developed during physical activity, and my initial suspicion was for a ganglion. This is not evident on ultrasound. The second thought was that she may have suffered a muscle tear, although I would expect this to be more painful than was evident on today's exam. The area does not have an appearance of an enlarged lymph node, and it would be very unlikely to have a contralateral axillary node develop after her previous left mastectomy.  Options for management were reviewed: 1) early CT versus 2) followup examination in 6 weeks. She'll consider the options and we'll plan on follow up in 6 weeks as a backstop, earlier reassessment if she developed pain or appreciated to change in size.      PCP: Sonnie Alamo 03/14/2014, 9:01 PM

## 2014-03-14 DIAGNOSIS — R2230 Localized swelling, mass and lump, unspecified upper limb: Secondary | ICD-10-CM | POA: Insufficient documentation

## 2014-03-21 ENCOUNTER — Ambulatory Visit: Payer: Medicare Other | Admitting: General Surgery

## 2014-03-27 DIAGNOSIS — Z7901 Long term (current) use of anticoagulants: Secondary | ICD-10-CM | POA: Diagnosis not present

## 2014-03-27 LAB — PROTIME-INR: INR: 2 — AB (ref 0.9–1.1)

## 2014-04-03 ENCOUNTER — Telehealth: Payer: Self-pay | Admitting: *Deleted

## 2014-04-03 NOTE — Telephone Encounter (Signed)
Received lab results for PT INR from [facility], Per Dr Gilford Rile, Notified [facility] nurse "continue coumadin same dose, repeat INR in 1 month"

## 2014-04-26 ENCOUNTER — Other Ambulatory Visit: Payer: Self-pay | Admitting: *Deleted

## 2014-04-26 MED ORDER — ALPRAZOLAM 0.5 MG PO TABS: ORAL_TABLET | ORAL | Status: DC

## 2014-04-26 NOTE — Telephone Encounter (Signed)
Rx faxed

## 2014-04-26 NOTE — Telephone Encounter (Signed)
Ok refill? 

## 2014-04-27 DIAGNOSIS — Z7901 Long term (current) use of anticoagulants: Secondary | ICD-10-CM | POA: Diagnosis not present

## 2014-04-27 LAB — PROTIME-INR: INR: 1.8 — AB (ref 0.9–1.1)

## 2014-04-30 ENCOUNTER — Telehealth: Payer: Self-pay | Admitting: Internal Medicine

## 2014-04-30 ENCOUNTER — Other Ambulatory Visit: Payer: Self-pay | Admitting: *Deleted

## 2014-04-30 MED ORDER — BISOPROLOL-HYDROCHLOROTHIAZIDE 5-6.25 MG PO TABS: ORAL_TABLET | ORAL | Status: DC

## 2014-04-30 MED ORDER — WARFARIN SODIUM 3 MG PO TABS: ORAL_TABLET | ORAL | Status: DC

## 2014-04-30 NOTE — Telephone Encounter (Signed)
Rx filled prior to this request

## 2014-04-30 NOTE — Telephone Encounter (Signed)
ALPRAZolam (XANAX) 0.5 MG tablet #90 There may be a back log on this medication if there is what medication can take its place  bisoprolol-hydrochlorothiazide (ZIAC) 5-6.25 MG per tablet #90   warfarin (COUMADIN) 3 MG tablet #90 (Brand GSMS) Needing that brand requested by the physician     Needing the results of her Blood Work

## 2014-05-01 ENCOUNTER — Telehealth: Payer: Self-pay | Admitting: Internal Medicine

## 2014-05-01 ENCOUNTER — Encounter: Payer: Self-pay | Admitting: General Surgery

## 2014-05-01 ENCOUNTER — Ambulatory Visit (INDEPENDENT_AMBULATORY_CARE_PROVIDER_SITE_OTHER): Payer: Medicare Other | Admitting: General Surgery

## 2014-05-01 VITALS — BP 144/76 | HR 72 | Resp 14 | Ht 60.0 in | Wt 150.0 lb

## 2014-05-01 DIAGNOSIS — R2231 Localized swelling, mass and lump, right upper limb: Secondary | ICD-10-CM

## 2014-05-01 NOTE — Patient Instructions (Addendum)
Patient to have a chest ct.  Patient is scheduled for a Chest CT with IV contrast at [facility] on 05/04/14 at 10:30 am. She is to arrive by 10:00 am. Patient is aware of date, time, and instructions.

## 2014-05-01 NOTE — Telephone Encounter (Signed)
Recent INR was 1.8. I would recommend continuing same dose and repeating INR in 4 weeks.

## 2014-05-01 NOTE — Progress Notes (Signed)
Patient ID: [name], female   DOB: April 12, 1926, 80 y.o.   MRN: [id number]  Chief Complaint  Patient presents with  . Follow-up    Right axilla mass    HPI [name] is a 80 y.o. female here today for her one month follow up right axilla mass. No changed noticed  HPI  Past Medical History  Diagnosis Date  . S/P IVC filter 2009  . Pulmonary embolus 2009  . Insomnia   . Allergy   . Hypertension   . Osteoporosis     Prolia 10/2012  . Cancer   . 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    Past Surgical History  Procedure Laterality Date  . Replacement total knee      right  . Cholecystectomy    . Hernia repair    . Bladder surgery    . Tonsillectomy    . Breast surgery Left 2013    mastectomy    Family History  Problem Relation Age of Onset  . COPD Mother   . Stroke Father   . Cancer Father     breast    Social History History  Substance Use Topics  . Smoking status: Former Smoker    Quit date: 05/13/1999  . Smokeless tobacco: Never Used  . Alcohol Use: No    Allergies  Allergen Reactions  . Erythromycin   . Sulfa Drugs Cross Reactors     rash    Current Outpatient Prescriptions  Medication Sig Dispense Refill  . ALPRAZolam (XANAX) 0.5 MG tablet TAKE 1 TABLET EVERY NIGHT AT BEDTIME AS NEEDED FOR SLEEP OR ANXIETY  90 tablet  0  . Biotin 10 MG TABS Take by mouth.      . bisoprolol-hydrochlorothiazide (ZIAC) 5-6.25 MG per tablet TAKE 1 TABLET DAILY  90 tablet  2  . Cholecalciferol (VITAMIN D PO) Take 2,000 mg by mouth daily.      . diphenoxylate-atropine (LOMOTIL) 2.5-0.025 MG per tablet Take 1 tablet by mouth 4 (four) times daily as needed for diarrhea or loose stools.  90 tablet  0  . fluticasone (FLONASE) 50 MCG/ACT nasal spray Place 1 spray into the nose daily.  16 g  6  . letrozole (FEMARA) 2.5 MG tablet Take 1 tablet (2.5 mg  total) by mouth daily.  90 tablet  3  . letrozole (FEMARA) 2.5 MG tablet TAKE 1 TABLET DAILY  90 tablet  3  . montelukast (SINGULAIR) 10 MG tablet Take 1 tablet (10 mg total) by mouth at bedtime.  90 tablet  4  . warfarin (COUMADIN) 1 MG tablet Please take as directed by physician  100 tablet  3  . warfarin (COUMADIN) 3 MG tablet TAKE 1 TABLET DAILY  90 tablet  0   No current facility-administered medications for this visit.    Review of Systems Review of Systems  Constitutional: Negative.   Respiratory: Negative.   Cardiovascular: Negative.     Blood pressure 144/76, pulse 72, resp. rate 14, height 5' (1.524 m), weight 150 lb (68.04 kg).  Physical Exam Physical Exam  Constitutional: She is oriented to person, place, and time. She appears well-developed and well-nourished.  Cardiovascular: Normal rate and normal heart sounds.   Pulmonary/Chest: Effort normal and breath sounds normal. Right breast exhibits no inverted nipple, no mass, no nipple discharge, no skin change and no tenderness.  Lymphadenopathy:  She has no cervical adenopathy.  The previously identified soft tissue mass right axilla is less prominent, but still present. It is difficult to feel with pressure from the lateral aspect towards the rib cage, much more prominent when grasped between the thumb and index finger. Well separated from the area of the axillary vessels.   Neurological: She is alert and oriented to person, place, and time.  Skin: Skin is warm and dry.     Assessment    Right axillary fullness. Doubtful adenopathy.    Plan    The areas wall is on her past exam, but the patient's anxiety level is higher. Options for management were again reviewed: 1) continued observation versus 2) CT scan.  Patient is scheduled for a Chest CT with IV contrast at [facility] on 05/04/14 at 10:30 am. She is to arrive by 10:00 am. Patient is aware of date, time, and instructions.     PCP:  Sonnie Alamo 05/02/2014, 8:56 PM

## 2014-05-01 NOTE — Telephone Encounter (Signed)
Notified pt. 

## 2014-05-02 ENCOUNTER — Encounter: Payer: Self-pay | Admitting: *Deleted

## 2014-05-04 ENCOUNTER — Ambulatory Visit: Payer: Self-pay | Admitting: General Surgery

## 2014-05-04 DIAGNOSIS — R911 Solitary pulmonary nodule: Secondary | ICD-10-CM | POA: Diagnosis not present

## 2014-05-07 ENCOUNTER — Encounter: Payer: Self-pay | Admitting: General Surgery

## 2014-05-07 ENCOUNTER — Telehealth: Payer: Self-pay | Admitting: General Surgery

## 2014-05-07 NOTE — Telephone Encounter (Signed)
Patient is in December recalls. Thanks.

## 2014-05-07 NOTE — Telephone Encounter (Signed)
The patient was notified that the CT of the chest obtained for persistent vague fullness in the right axilla was negative. No lymphadenopathy. No primary tumor.  We'll plan to get together December 2015 in regards to her annual mammogram.

## 2014-05-10 ENCOUNTER — Other Ambulatory Visit: Payer: Self-pay | Admitting: *Deleted

## 2014-05-10 ENCOUNTER — Telehealth: Payer: Self-pay | Admitting: *Deleted

## 2014-05-10 MED ORDER — BISOPROLOL-HYDROCHLOROTHIAZIDE 5-6.25 MG PO TABS: ORAL_TABLET | ORAL | Status: DC

## 2014-05-10 NOTE — Telephone Encounter (Signed)
Pt called asking about getting her prolia injection. Please advise when the approval is given

## 2014-05-10 NOTE — Telephone Encounter (Signed)
Spoke with pt and received fax stating that medication refill bisoprolol-hydrochlorothiazide (ZIAC) 5-6.25 MG per tablet was out of stock.  Pt request for an Rx to be sent to [facility]

## 2014-05-11 NOTE — Telephone Encounter (Signed)
I have electronically sent pt's info for [name] verification and will let you know as soon as I have a response. Thank you.

## 2014-05-16 NOTE — Telephone Encounter (Signed)
I have rec'd pt's Prolia insurance verification. Pt has an estimated responsibility of $0 plus any applicable deductible. Please make her aware this is an estimate and we can't give her an exact amt until both of her insurances have paid. I have sent a copy of the summary of benefits to be scanned into her chart. Please let me know if you have any questions. Thank you.

## 2014-05-22 ENCOUNTER — Encounter: Payer: Self-pay | Admitting: Internal Medicine

## 2014-05-24 ENCOUNTER — Ambulatory Visit (INDEPENDENT_AMBULATORY_CARE_PROVIDER_SITE_OTHER): Payer: Medicare Other | Admitting: *Deleted

## 2014-05-24 DIAGNOSIS — M8580 Other specified disorders of bone density and structure, unspecified site: Secondary | ICD-10-CM

## 2014-05-24 MED ORDER — DENOSUMAB 60 MG/ML ~~LOC~~ SOLN
60.0000 mg | Freq: Once | SUBCUTANEOUS | Status: AC
  Administered 2014-05-24: 60 mg via SUBCUTANEOUS

## 2014-06-01 DIAGNOSIS — Z7901 Long term (current) use of anticoagulants: Secondary | ICD-10-CM | POA: Diagnosis not present

## 2014-06-01 LAB — PROTIME-INR

## 2014-06-04 ENCOUNTER — Telehealth: Payer: Self-pay | Admitting: Internal Medicine

## 2014-06-04 ENCOUNTER — Other Ambulatory Visit: Payer: Self-pay | Admitting: *Deleted

## 2014-06-04 MED ORDER — WARFARIN SODIUM 1 MG PO TABS: ORAL_TABLET | ORAL | Status: DC

## 2014-06-04 NOTE — Telephone Encounter (Signed)
Notified pt, Pt verbalized understanding 

## 2014-06-04 NOTE — Telephone Encounter (Signed)
Spoke to patient, she has not missed any doses. On coumadin 3 mg daily.

## 2014-06-04 NOTE — Telephone Encounter (Signed)
Spoke with pt, needing Coumadin 1 mg sent to [facility]. Pt states she has 10 tablets at home currently until her Rx arrives. Rx sent to pharmacy by [facility]

## 2014-06-04 NOTE — Telephone Encounter (Signed)
Labs showed INR 1.7. Can you confirm that pt has not missed doses of Coumadin? Can you confirm dose she has been taking? Thanks

## 2014-06-04 NOTE — Telephone Encounter (Signed)
Can she please increase to 4mg  daily? Recheck INR 1 week

## 2014-06-11 DIAGNOSIS — Z7901 Long term (current) use of anticoagulants: Secondary | ICD-10-CM | POA: Diagnosis not present

## 2014-06-11 LAB — PROTIME-INR: INR: 2.6 — AB (ref 0.9–1.1)

## 2014-06-13 ENCOUNTER — Other Ambulatory Visit: Payer: Self-pay | Admitting: Internal Medicine

## 2014-06-13 NOTE — Telephone Encounter (Signed)
Are you refilling her Femara?

## 2014-06-13 NOTE — Telephone Encounter (Signed)
This should be filled by oncology

## 2014-06-14 ENCOUNTER — Telehealth: Payer: Self-pay

## 2014-06-14 NOTE — Telephone Encounter (Signed)
The patient called confused about her coumadin schedule.  She is hoping to get clarification on "the coumadin report"  Callback - [phone]

## 2014-06-14 NOTE — Telephone Encounter (Signed)
Per Dr Gilford Rile, continue same INR dose and repeat in 1 month

## 2014-06-19 DIAGNOSIS — Z85828 Personal history of other malignant neoplasm of skin: Secondary | ICD-10-CM | POA: Diagnosis not present

## 2014-06-19 DIAGNOSIS — L570 Actinic keratosis: Secondary | ICD-10-CM | POA: Diagnosis not present

## 2014-06-19 DIAGNOSIS — L408 Other psoriasis: Secondary | ICD-10-CM | POA: Diagnosis not present

## 2014-06-19 DIAGNOSIS — L821 Other seborrheic keratosis: Secondary | ICD-10-CM | POA: Diagnosis not present

## 2014-07-02 ENCOUNTER — Ambulatory Visit: Payer: Self-pay | Admitting: General Surgery

## 2014-07-02 ENCOUNTER — Encounter: Payer: Self-pay | Admitting: Internal Medicine

## 2014-07-02 DIAGNOSIS — Z1231 Encounter for screening mammogram for malignant neoplasm of breast: Secondary | ICD-10-CM | POA: Diagnosis not present

## 2014-07-03 ENCOUNTER — Encounter: Payer: Self-pay | Admitting: Internal Medicine

## 2014-07-03 ENCOUNTER — Encounter: Payer: Self-pay | Admitting: General Surgery

## 2014-07-10 ENCOUNTER — Encounter: Payer: Self-pay | Admitting: General Surgery

## 2014-07-10 ENCOUNTER — Ambulatory Visit (INDEPENDENT_AMBULATORY_CARE_PROVIDER_SITE_OTHER): Payer: Medicare Other | Admitting: General Surgery

## 2014-07-10 VITALS — BP 130/74 | HR 74 | Resp 14 | Ht 60.0 in | Wt 149.0 lb

## 2014-07-10 DIAGNOSIS — Z853 Personal history of malignant neoplasm of breast: Secondary | ICD-10-CM

## 2014-07-10 NOTE — Patient Instructions (Signed)
The patient has been asked to return to the office in one year with a right diagnostic mammogram. 

## 2014-07-10 NOTE — Progress Notes (Signed)
Patient ID: [name], female   DOB: 05/03/1926, 80 y.o.   MRN: [id number]  Chief Complaint  Patient presents with  . Follow-up    mammogram     HPI [name] is a 80 y.o. female who presents for a breast evaluation. The most recent right mammogram was done on 07/02/14 Patient does perform regular self breast checks and gets regular mammograms done.    HPI  Past Medical History  Diagnosis Date  . S/P IVC filter 2009  . Pulmonary embolus 2009  . Insomnia   . Allergy   . Hypertension   . Osteoporosis     Prolia 10/2012  . Cancer   . 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    Past Surgical History  Procedure Laterality Date  . Replacement total knee      right  . Cholecystectomy    . Hernia repair    . Bladder surgery    . Tonsillectomy    . Breast surgery Left 2013    mastectomy    Family History  Problem Relation Age of Onset  . COPD Mother   . Stroke Father   . Cancer Father     breast    Social History History  Substance Use Topics  . Smoking status: Former Smoker    Quit date: 05/13/1999  . Smokeless tobacco: Never Used  . Alcohol Use: No    Allergies  Allergen Reactions  . Erythromycin   . Sulfa Drugs Cross Reactors     rash    Current Outpatient Prescriptions  Medication Sig Dispense Refill  . ALPRAZolam (XANAX) 0.5 MG tablet TAKE 1 TABLET EVERY NIGHT AT BEDTIME AS NEEDED FOR SLEEP OR ANXIETY 90 tablet 0  . Biotin 10 MG TABS Take by mouth.    . bisoprolol-hydrochlorothiazide (ZIAC) 5-6.25 MG per tablet TAKE 1 TABLET DAILY 30 tablet 1  . Cholecalciferol (VITAMIN D PO) Take 2,000 mg by mouth daily.    . diphenoxylate-atropine (LOMOTIL) 2.5-0.025 MG per tablet Take 1 tablet by mouth 4 (four) times daily as needed for diarrhea or loose stools. 90 tablet 0  . letrozole (FEMARA) 2.5 MG tablet Take 1 tablet (2.5 mg total) by  mouth daily. 90 tablet 3  . montelukast (SINGULAIR) 10 MG tablet Take 1 tablet (10 mg total) by mouth at bedtime. 90 tablet 4  . warfarin (COUMADIN) 1 MG tablet Please take as directed by physician 100 tablet 3  . warfarin (COUMADIN) 3 MG tablet TAKE 1 TABLET DAILY 90 tablet 0   No current facility-administered medications for this visit.    Review of Systems Review of Systems  Constitutional: Negative.   Respiratory: Negative.   Cardiovascular: Negative.     Blood pressure 130/74, pulse 74, resp. rate 14, height 5' (1.524 m), weight 149 lb (67.586 kg).  Physical Exam Physical Exam  Constitutional: She is oriented to person, place, and time. She appears well-developed and well-nourished.  Eyes: Conjunctivae are normal. No scleral icterus.  Neck: Neck supple.  Cardiovascular: Normal rate and regular rhythm.   Pulmonary/Chest: Effort normal and breath sounds normal. Right breast exhibits no inverted nipple, no mass, no nipple discharge, no skin change and no tenderness.    Left mastectomy site well healed.   Lymphadenopathy:    She has no cervical adenopathy.  Neurological: She is alert and oriented to person, place, and time.  Skin: Skin is warm and dry.    Data Reviewed Ultrasound of the right axilla in August 2015 was negative.  CT scan in October 2015 showed no discernible abnormality in the right axilla. A 5 mm right middle lobe nodule was identified. Right breast mammogram dated 07/02/2014 shows a near fatty replaced breast. No axillary adenopathy noted. Films independently reviewed. BI-RADS-1. (The patient reports discomfort during the mammogram which has resolved).  Assessment    Benign breast exam.  Tiny pulmonary nodule, unlikely clinical significance.    Plan   The patient has been asked to return to the office in one year with a right diagnostic mammogram.  A new prescription for breast prosthesis and surgical process was provided.  PCP: Sonnie Alamo 07/11/2014, 5:19 AM

## 2014-07-12 DIAGNOSIS — Z7901 Long term (current) use of anticoagulants: Secondary | ICD-10-CM | POA: Diagnosis not present

## 2014-07-12 LAB — PROTIME-INR

## 2014-07-13 ENCOUNTER — Telehealth: Payer: Self-pay | Admitting: Internal Medicine

## 2014-07-13 NOTE — Telephone Encounter (Signed)
Pt states that she is taking 4mg  per day.  She states that her 3mg  tablet comes from mail delivery and 1mg  comes from the local pharmacy, pt states that she has been told the medications coming from different pharmacies can make a difference and she also states that since Thanksgiving she has been eating a lot of cranberries, not any green salads though.

## 2014-07-13 NOTE — Telephone Encounter (Signed)
OK. Please have her decrease to 3mg  daily and recheck INR next week.

## 2014-07-13 NOTE — Telephone Encounter (Signed)
Notified pt. 

## 2014-07-13 NOTE — Telephone Encounter (Signed)
Coumadin level was high at 3.4. Have there been any recent changes to Coumadin dosing? Has she been on antibiotics?

## 2014-07-23 DIAGNOSIS — Z7901 Long term (current) use of anticoagulants: Secondary | ICD-10-CM | POA: Diagnosis not present

## 2014-07-23 LAB — PROTIME-INR: INR: 1.9 — AB (ref 0.9–1.1)

## 2014-07-24 ENCOUNTER — Telehealth: Payer: Self-pay | Admitting: Internal Medicine

## 2014-07-24 NOTE — Telephone Encounter (Signed)
INR is now 1.8. Previous 3.4. Can we have her alternate 3mg  and 4mg  dosing.  Mon,Wed Fri 4mg  Tue,Thu, Sat Sun 3mg   Repeat INR 1 week.

## 2014-07-26 NOTE — Telephone Encounter (Signed)
Notified pt. 

## 2014-07-30 DIAGNOSIS — H16223 Keratoconjunctivitis sicca, not specified as Sjogren's, bilateral: Secondary | ICD-10-CM | POA: Diagnosis not present

## 2014-07-31 DIAGNOSIS — Z7901 Long term (current) use of anticoagulants: Secondary | ICD-10-CM | POA: Diagnosis not present

## 2014-07-31 LAB — PROTIME-INR: INR: 2.1 — AB (ref 0.9–1.1)

## 2014-08-01 ENCOUNTER — Telehealth: Payer: Self-pay | Admitting: Internal Medicine

## 2014-08-01 NOTE — Telephone Encounter (Signed)
Labs showed INR 2.1 which is at goal. Continue coumadin same dose and repeat INR in 4 weeks.

## 2014-08-01 NOTE — Telephone Encounter (Signed)
Called and advised patient of results,  verbalized understanding. 

## 2014-08-09 DIAGNOSIS — M1712 Unilateral primary osteoarthritis, left knee: Secondary | ICD-10-CM | POA: Diagnosis not present

## 2014-08-09 DIAGNOSIS — Z96651 Presence of right artificial knee joint: Secondary | ICD-10-CM | POA: Diagnosis not present

## 2014-08-14 ENCOUNTER — Encounter: Payer: Self-pay | Admitting: Internal Medicine

## 2014-08-22 ENCOUNTER — Encounter: Payer: Self-pay | Admitting: Internal Medicine

## 2014-08-22 ENCOUNTER — Other Ambulatory Visit: Payer: Self-pay | Admitting: *Deleted

## 2014-08-22 ENCOUNTER — Telehealth: Payer: Self-pay | Admitting: Internal Medicine

## 2014-08-22 MED ORDER — LETROZOLE 2.5 MG PO TABS: 2.5000 mg | ORAL_TABLET | Freq: Every day | ORAL | Status: DC

## 2014-08-22 MED ORDER — ALPRAZOLAM 0.5 MG PO TABS: ORAL_TABLET | ORAL | Status: DC

## 2014-08-22 NOTE — Telephone Encounter (Addendum)
Patient needs her Letrozole and Xanax refilled.  10mth supply

## 2014-08-22 NOTE — Telephone Encounter (Signed)
Rx faxed

## 2014-08-22 NOTE — Telephone Encounter (Signed)
That is fine 

## 2014-08-30 ENCOUNTER — Encounter: Payer: Self-pay | Admitting: Internal Medicine

## 2014-08-31 ENCOUNTER — Encounter: Payer: Self-pay | Admitting: Internal Medicine

## 2014-08-31 ENCOUNTER — Ambulatory Visit (INDEPENDENT_AMBULATORY_CARE_PROVIDER_SITE_OTHER): Payer: Medicare Other | Admitting: Internal Medicine

## 2014-08-31 VITALS — BP 170/78 | HR 76 | Temp 97.8°F | Ht 60.0 in | Wt 147.0 lb

## 2014-08-31 DIAGNOSIS — Z7901 Long term (current) use of anticoagulants: Secondary | ICD-10-CM | POA: Diagnosis not present

## 2014-08-31 DIAGNOSIS — C50919 Malignant neoplasm of unspecified site of unspecified female breast: Secondary | ICD-10-CM

## 2014-08-31 DIAGNOSIS — R413 Other amnesia: Secondary | ICD-10-CM | POA: Diagnosis not present

## 2014-08-31 DIAGNOSIS — R2681 Unsteadiness on feet: Secondary | ICD-10-CM

## 2014-08-31 DIAGNOSIS — R2231 Localized swelling, mass and lump, right upper limb: Secondary | ICD-10-CM | POA: Diagnosis not present

## 2014-08-31 DIAGNOSIS — I10 Essential (primary) hypertension: Secondary | ICD-10-CM | POA: Diagnosis not present

## 2014-08-31 DIAGNOSIS — M179 Osteoarthritis of knee, unspecified: Secondary | ICD-10-CM

## 2014-08-31 DIAGNOSIS — M8580 Other specified disorders of bone density and structure, unspecified site: Secondary | ICD-10-CM | POA: Diagnosis not present

## 2014-08-31 DIAGNOSIS — M1712 Unilateral primary osteoarthritis, left knee: Secondary | ICD-10-CM

## 2014-08-31 DIAGNOSIS — F329 Major depressive disorder, single episode, unspecified: Secondary | ICD-10-CM

## 2014-08-31 DIAGNOSIS — F32A Depression, unspecified: Secondary | ICD-10-CM

## 2014-08-31 LAB — COMPREHENSIVE METABOLIC PANEL
ALT: 10 U/L (ref 0–35)
AST: 14 U/L (ref 0–37)
Albumin: 3.9 g/dL (ref 3.5–5.2)
Alkaline Phosphatase: 46 U/L (ref 39–117)
BUN: 16 mg/dL (ref 6–23)
CO2: 29 mEq/L (ref 19–32)
Calcium: 9.4 mg/dL (ref 8.4–10.5)
Chloride: 105 mEq/L (ref 96–112)
Creatinine, Ser: 0.99 mg/dL (ref 0.40–1.20)
GFR: 56.41 mL/min — ABNORMAL LOW (ref >60.00)
Glucose, Bld: 84 mg/dL (ref 70–99)
Potassium: 4.2 mEq/L (ref 3.5–5.1)
Sodium: 140 mEq/L (ref 135–145)
Total Bilirubin: 0.6 mg/dL (ref 0.2–1.2)
Total Protein: 6.6 g/dL (ref 6.0–8.3)

## 2014-08-31 LAB — LIPID PANEL
Cholesterol: 190 mg/dL (ref 0–200)
HDL: 55.3 mg/dL (ref >39.00)
LDL Cholesterol: 108 mg/dL — ABNORMAL HIGH (ref 0–99)
NonHDL: 134.7
Total CHOL/HDL Ratio: 3
Triglycerides: 132 mg/dL (ref 0.0–149.0)
VLDL: 26.4 mg/dL (ref 0.0–40.0)

## 2014-08-31 LAB — MICROALBUMIN / CREATININE URINE RATIO
Creatinine,U: 199.6 mg/dL
Microalb Creat Ratio: 0.7 mg/g (ref 0.0–30.0)
Microalb, Ur: 1.4 mg/dL (ref 0.0–1.9)

## 2014-08-31 LAB — CBC WITH DIFFERENTIAL/PLATELET
Basophils Absolute: 0.1 10*3/uL (ref 0.0–0.1)
Basophils Relative: 0.8 % (ref 0.0–3.0)
Eosinophils Absolute: 0.1 10*3/uL (ref 0.0–0.7)
Eosinophils Relative: 1.7 % (ref 0.0–5.0)
HCT: 40 % (ref 36.0–46.0)
Hemoglobin: 13.7 g/dL (ref 12.0–15.0)
Lymphocytes Relative: 27.4 % (ref 12.0–46.0)
Lymphs Abs: 1.9 10*3/uL (ref 0.7–4.0)
MCHC: 34.3 g/dL (ref 30.0–36.0)
MCV: 85.4 fl (ref 78.0–100.0)
Monocytes Absolute: 0.6 10*3/uL (ref 0.1–1.0)
Monocytes Relative: 8.1 % (ref 3.0–12.0)
Neutro Abs: 4.2 10*3/uL (ref 1.4–7.7)
Neutrophils Relative %: 62 % (ref 43.0–77.0)
Platelets: 261 10*3/uL (ref 150.0–400.0)
RBC: 4.68 Mil/uL (ref 3.87–5.11)
RDW: 13.8 % (ref 11.5–15.5)
WBC: 6.9 10*3/uL (ref 4.0–10.5)

## 2014-08-31 LAB — VITAMIN D 25 HYDROXY (VIT D DEFICIENCY, FRACTURES): VITD: 38.83 ng/mL (ref 30.00–100.00)

## 2014-08-31 LAB — VITAMIN B12: Vitamin B-12: 369 pg/mL (ref 211–911)

## 2014-08-31 LAB — PROTIME-INR
INR: 2.3 ratio — ABNORMAL HIGH (ref 0.8–1.0)
Prothrombin Time: 24.9 s — ABNORMAL HIGH (ref 9.6–13.1)

## 2014-08-31 LAB — TSH: TSH: 2.2 u[IU]/mL (ref 0.35–4.50)

## 2014-08-31 NOTE — Assessment & Plan Note (Signed)
Nodular area right axilla most consistent with lymph node on exam and recent Chest CT reviewed today. Will monitor.

## 2014-08-31 NOTE — Assessment & Plan Note (Signed)
Continue Coumadin goal INR 2-3. INR today.

## 2014-08-31 NOTE — Assessment & Plan Note (Signed)
Mild short term memory loss noted by pt. Encouraged her not to take supplemental medications OTC for this. Will check TSH and B12 with labs. Follow up in 4 weeks.

## 2014-08-31 NOTE — Assessment & Plan Note (Signed)
Continues on Letrozole.Will continue.

## 2014-08-31 NOTE — Assessment & Plan Note (Signed)
Left knee pain 2/2 OA. Improved with recent steroid injection. Will continue to follow up with ortho.

## 2014-08-31 NOTE — Assessment & Plan Note (Signed)
Recent worsening symptoms of depression. Prefers not to add medication. Offered support today. Follow up in 4 weeks.

## 2014-08-31 NOTE — Progress Notes (Signed)
Subjective:    Patient ID: [name], female    DOB: January 19, 1926, 81 y.o.   MRN: [id number]  HPI  81YO female presents for follow up. Last seen in 04/2013 for Wellness Visit.  Recently seen by ortho for left knee pain. Given joint injection with steroid with some improvement in pain. Xray reportedly showed arthritis. Dr. Sabra Heck  Concerned about using Ziac for so long. Notes that her urine output seems to be lower recently, however also notes she drinks very little.  Feeling down recently. Wants to live back in [place]. [place]  Feels that balance is off recently and unstable. No falls. Questions if Xanax may be contributing to this, however does not want to go off the medication, as it is the only thing that has helped her to sleep at night. Also notes some decreased short term memory.   Past medical, surgical, family and social history per today's encounter.  Review of Systems  Constitutional: Negative for fever, chills, appetite change, fatigue and unexpected weight change.  Eyes: Negative for visual disturbance.  Respiratory: Negative for shortness of breath.   Cardiovascular: Negative for chest pain and leg swelling.  Gastrointestinal: Negative for nausea, vomiting, abdominal pain, diarrhea and constipation.  Musculoskeletal: Positive for arthralgias and gait problem. Negative for myalgias.  Skin: Negative for color change and rash.  Hematological: Negative for adenopathy. Does not bruise/bleed easily.  Psychiatric/Behavioral: Positive for sleep disturbance and dysphoric mood. Negative for suicidal ideas. The patient is not nervous/anxious.        Objective:    BP 170/78 mmHg  Pulse 76  Temp(Src) 97.8 F (36.6 C) (Oral)  Ht 5' (1.524 m)  Wt 147 lb (66.679 kg)  BMI 28.71 kg/m2  SpO2 93% Physical Exam  Constitutional: She is oriented to person, place, and time. She appears well-developed and well-nourished. No distress.  HENT:  Head: Normocephalic and atraumatic.    Right Ear: External ear normal.  Left Ear: External ear normal.  Nose: Nose normal.  Mouth/Throat: Oropharynx is clear and moist. No oropharyngeal exudate.  Eyes: Conjunctivae are normal. Pupils are equal, round, and reactive to light. Right eye exhibits no discharge. Left eye exhibits no discharge. No scleral icterus.  Neck: Normal range of motion. Neck supple. No tracheal deviation present. No thyromegaly present.  Cardiovascular: Normal rate, regular rhythm, normal heart sounds and intact distal pulses.  Exam reveals no gallop and no friction rub.   No murmur heard. Pulmonary/Chest: Effort normal and breath sounds normal. No respiratory distress. She has no wheezes. She has no rales. She exhibits no tenderness.  Musculoskeletal: Normal range of motion. She exhibits no edema or tenderness.  Lymphadenopathy:    She has no cervical adenopathy.    She has axillary adenopathy.  Right axillary node approx 2cm diameter on exam, rubbery and movable.  Neurological: She is alert and oriented to person, place, and time. No cranial nerve deficit. She exhibits normal muscle tone. Coordination normal.  Skin: Skin is warm and dry. No rash noted. She is not diaphoretic. No erythema. No pallor.  Psychiatric: She has a normal mood and affect. Her speech is normal and behavior is normal. Judgment and thought content normal. Cognition and memory are normal.          Assessment & Plan:  Over 3min of which >50% spent in face-to-face contact with patient discussing plan of care  Problem List Items Addressed This Visit      Unprioritized   Axillary mass    Nodular  area right axilla most consistent with lymph node on exam and recent Chest CT reviewed today. Will monitor.      Breast cancer    Continues on Letrozole.Will continue.      Chronic anticoagulation    Continue Coumadin goal INR 2-3. INR today.      Relevant Orders   Protime-INR   Depression    Recent worsening symptoms of  depression. Prefers not to add medication. Offered support today. Follow up in 4 weeks.      Gait instability    Gait instability noted by pt however exam normal today. Will check B12 level with labs. Follow up in 4 weeks.      Relevant Orders   TSH   B12   Hypertension - Primary    BP Readings from Last 3 Encounters:  08/31/14 170/78  07/10/14 130/74  05/01/14 144/76   BP elevated, however has been previously well controlled. Will check renal function with labs. Monitor BP weekly at [facility]. Follow up in 4 weeks. Discussed increase in Ziac dose if BP does not improve.      Relevant Orders   Comprehensive metabolic panel   Lipid panel   Microalbumin / creatinine urine ratio   Osteoarthritis of left knee    Left knee pain 2/2 OA. Improved with recent steroid injection. Will continue to follow up with ortho.      Relevant Orders   CBC with Differential/Platelet   Vit D  25 hydroxy (rtn osteoporosis monitoring)   Osteopenia    Will schedule follow up bone density testing.      Relevant Orders   DG Bone Density    Other Visit Diagnoses    Memory loss            Return in about 4 weeks (around 09/28/2014) for Wellness Visit.

## 2014-08-31 NOTE — Patient Instructions (Signed)
Please ask the nurse to check your blood pressure once weekly at [facility]  Labs today.

## 2014-08-31 NOTE — Progress Notes (Signed)
Pre visit review using our clinic review tool, if applicable. No additional management support is needed unless otherwise documented below in the visit note. 

## 2014-08-31 NOTE — Assessment & Plan Note (Signed)
BP Readings from Last 3 Encounters:  08/31/14 170/78  07/10/14 130/74  05/01/14 144/76   BP elevated, however has been previously well controlled. Will check renal function with labs. Monitor BP weekly at [facility]. Follow up in 4 weeks. Discussed increase in Ziac dose if BP does not improve.

## 2014-08-31 NOTE — Assessment & Plan Note (Signed)
Gait instability noted by pt however exam normal today. Will check B12 level with labs. Follow up in 4 weeks.

## 2014-08-31 NOTE — Assessment & Plan Note (Signed)
Will schedule follow up bone density testing.

## 2014-09-04 ENCOUNTER — Telehealth: Payer: Self-pay | Admitting: Internal Medicine

## 2014-09-04 NOTE — Telephone Encounter (Signed)
emmi mailed  °

## 2014-09-13 DIAGNOSIS — I10 Essential (primary) hypertension: Secondary | ICD-10-CM | POA: Diagnosis not present

## 2014-09-20 ENCOUNTER — Telehealth: Payer: Self-pay | Admitting: Internal Medicine

## 2014-09-20 ENCOUNTER — Other Ambulatory Visit: Payer: Self-pay | Admitting: *Deleted

## 2014-09-20 MED ORDER — BISOPROLOL-HYDROCHLOROTHIAZIDE 5-6.25 MG PO TABS: ORAL_TABLET | ORAL | Status: DC

## 2014-09-20 NOTE — Telephone Encounter (Signed)
Okay to refill? 

## 2014-09-20 NOTE — Telephone Encounter (Signed)
bisoprolol-hydrochlorothiazide (ZIAC) 5-6.25 MG per tablet

## 2014-09-20 NOTE — Telephone Encounter (Signed)
Fine to fill. 

## 2014-09-21 ENCOUNTER — Other Ambulatory Visit: Payer: Self-pay | Admitting: *Deleted

## 2014-09-21 MED ORDER — BISOPROLOL-HYDROCHLOROTHIAZIDE 5-6.25 MG PO TABS: ORAL_TABLET | ORAL | Status: DC

## 2014-10-01 DIAGNOSIS — Z7901 Long term (current) use of anticoagulants: Secondary | ICD-10-CM | POA: Diagnosis not present

## 2014-10-01 LAB — POCT INR: INR: 2.1 — AB (ref 0.9–1.1)

## 2014-10-02 ENCOUNTER — Telehealth: Payer: Self-pay | Admitting: Internal Medicine

## 2014-10-02 NOTE — Telephone Encounter (Signed)
Notified pt. 

## 2014-10-02 NOTE — Telephone Encounter (Signed)
Coumadin level was at goal at 2.1. Please have pt continue same dose and repeat INR in 1 month.

## 2014-10-09 ENCOUNTER — Ambulatory Visit (INDEPENDENT_AMBULATORY_CARE_PROVIDER_SITE_OTHER): Payer: Medicare Other | Admitting: Internal Medicine

## 2014-10-09 ENCOUNTER — Encounter: Payer: Self-pay | Admitting: Internal Medicine

## 2014-10-09 VITALS — BP 162/96 | HR 83 | Temp 97.6°F | Ht 60.0 in | Wt 149.1 lb

## 2014-10-09 DIAGNOSIS — R2231 Localized swelling, mass and lump, right upper limb: Secondary | ICD-10-CM | POA: Diagnosis not present

## 2014-10-09 DIAGNOSIS — Z7901 Long term (current) use of anticoagulants: Secondary | ICD-10-CM | POA: Diagnosis not present

## 2014-10-09 DIAGNOSIS — M179 Osteoarthritis of knee, unspecified: Secondary | ICD-10-CM

## 2014-10-09 DIAGNOSIS — M1712 Unilateral primary osteoarthritis, left knee: Secondary | ICD-10-CM

## 2014-10-09 DIAGNOSIS — I10 Essential (primary) hypertension: Secondary | ICD-10-CM | POA: Diagnosis not present

## 2014-10-09 DIAGNOSIS — Z23 Encounter for immunization: Secondary | ICD-10-CM

## 2014-10-09 DIAGNOSIS — Z0000 Encounter for general adult medical examination without abnormal findings: Secondary | ICD-10-CM | POA: Diagnosis not present

## 2014-10-09 MED ORDER — BISOPROLOL-HYDROCHLOROTHIAZIDE 5-6.25 MG PO TABS: ORAL_TABLET | ORAL | Status: DC

## 2014-10-09 NOTE — Addendum Note (Signed)
Addended by: Vernetta Honey on: 10/09/2014 02:55 PM   Modules accepted: Orders

## 2014-10-09 NOTE — Patient Instructions (Addendum)
Please recheck INR in 3-4 weeks.  Health Maintenance Adopting a healthy lifestyle and getting preventive care can go a long way to promote health and wellness. Talk with your health care provider about what schedule of regular examinations is right for you. This is a good chance for you to check in with your provider about disease prevention and staying healthy. In between checkups, there are plenty of things you can do on your own. Experts have done a lot of research about which lifestyle changes and preventive measures are most likely to keep you healthy. Ask your health care provider for more information. WEIGHT AND DIET  Eat a healthy diet  Be sure to include plenty of vegetables, fruits, low-fat dairy products, and lean protein.  Do not eat a lot of foods high in solid fats, added sugars, or salt.  Get regular exercise. This is one of the most important things you can do for your health.  Most adults should exercise for at least 150 minutes each week. The exercise should increase your heart rate and make you sweat (moderate-intensity exercise).  Most adults should also do strengthening exercises at least twice a week. This is in addition to the moderate-intensity exercise.  Maintain a healthy weight  Body mass index (BMI) is a measurement that can be used to identify possible weight problems. It estimates body fat based on height and weight. Your health care provider can help determine your BMI and help you achieve or maintain a healthy weight.  For females 42 years of age and older:   A BMI below 18.5 is considered underweight.  A BMI of 18.5 to 24.9 is normal.  A BMI of 25 to 29.9 is considered overweight.  A BMI of 30 and above is considered obese.  Watch levels of cholesterol and blood lipids  You should start having your blood tested for lipids and cholesterol at 79 years of age, then have this test every 5 years.  You may need to have your cholesterol levels checked  more often if:  Your lipid or cholesterol levels are high.  You are older than 79 years of age.  You are at high risk for heart disease.  CANCER SCREENING   Lung Cancer  Lung cancer screening is recommended for adults 25-34 years old who are at high risk for lung cancer because of a history of smoking.  A yearly low-dose CT scan of the lungs is recommended for people who:  Currently smoke.  Have quit within the past 15 years.  Have at least a 30-pack-year history of smoking. A pack year is smoking an average of one pack of cigarettes a day for 1 year.  Yearly screening should continue until it has been 15 years since you quit.  Yearly screening should stop if you develop a health problem that would prevent you from having lung cancer treatment.  Breast Cancer  Practice breast self-awareness. This means understanding how your breasts normally appear and feel.  It also means doing regular breast self-exams. Let your health care provider know about any changes, no matter how small.  If you are in your 20s or 30s, you should have a clinical breast exam (CBE) by a health care provider every 1-3 years as part of a regular health exam.  If you are 25 or older, have a CBE every year. Also consider having a breast X-ray (mammogram) every year.  If you have a family history of breast cancer, talk to your health care provider  about genetic screening.  If you are at high risk for breast cancer, talk to your health care provider about having an MRI and a mammogram every year.  Breast cancer gene (BRCA) assessment is recommended for women who have family members with BRCA-related cancers. BRCA-related cancers include:  Breast.  Ovarian.  Tubal.  Peritoneal cancers.  Results of the assessment will determine the need for genetic counseling and BRCA1 and BRCA2 testing. Cervical Cancer Routine pelvic examinations to screen for cervical cancer are no longer recommended for  nonpregnant women who are considered low risk for cancer of the pelvic organs (ovaries, uterus, and vagina) and who do not have symptoms. A pelvic examination may be necessary if you have symptoms including those associated with pelvic infections. Ask your health care provider if a screening pelvic exam is right for you.   The Pap test is the screening test for cervical cancer for women who are considered at risk.  If you had a hysterectomy for a problem that was not cancer or a condition that could lead to cancer, then you no longer need Pap tests.  If you are older than 65 years, and you have had normal Pap tests for the past 10 years, you no longer need to have Pap tests.  If you have had past treatment for cervical cancer or a condition that could lead to cancer, you need Pap tests and screening for cancer for at least 20 years after your treatment.  If you no longer get a Pap test, assess your risk factors if they change (such as having a new sexual partner). This can affect whether you should start being screened again.  Some women have medical problems that increase their chance of getting cervical cancer. If this is the case for you, your health care provider may recommend more frequent screening and Pap tests.  The human papillomavirus (HPV) test is another test that may be used for cervical cancer screening. The HPV test looks for the virus that can cause cell changes in the cervix. The cells collected during the Pap test can be tested for HPV.  The HPV test can be used to screen women 6 years of age and older. Getting tested for HPV can extend the interval between normal Pap tests from three to five years.  An HPV test also should be used to screen women of any age who have unclear Pap test results.  After 79 years of age, women should have HPV testing as often as Pap tests.  Colorectal Cancer  This type of cancer can be detected and often prevented.  Routine colorectal cancer  screening usually begins at 79 years of age and continues through 79 years of age.  Your health care provider may recommend screening at an earlier age if you have risk factors for colon cancer.  Your health care provider may also recommend using home test kits to check for hidden blood in the stool.  A small camera at the end of a tube can be used to examine your colon directly (sigmoidoscopy or colonoscopy). This is done to check for the earliest forms of colorectal cancer.  Routine screening usually begins at age 21.  Direct examination of the colon should be repeated every 5-10 years through 79 years of age. However, you may need to be screened more often if early forms of precancerous polyps or small growths are found. Skin Cancer  Check your skin from head to toe regularly.  Tell your health care provider  about any new moles or changes in moles, especially if there is a change in a mole's shape or color.  Also tell your health care provider if you have a mole that is larger than the size of a pencil eraser.  Always use sunscreen. Apply sunscreen liberally and repeatedly throughout the day.  Protect yourself by wearing long sleeves, pants, a wide-brimmed hat, and sunglasses whenever you are outside. HEART DISEASE, DIABETES, AND HIGH BLOOD PRESSURE   Have your blood pressure checked at least every 1-2 years. High blood pressure causes heart disease and increases the risk of stroke.  If you are between 55 years and 79 years old, ask your health care provider if you should take aspirin to prevent strokes.  Have regular diabetes screenings. This involves taking a blood sample to check your fasting blood sugar level.  If you are at a normal weight and have a low risk for diabetes, have this test once every three years after 79 years of age.  If you are overweight and have a high risk for diabetes, consider being tested at a younger age or more often. PREVENTING INFECTION  Hepatitis  B  If you have a higher risk for hepatitis B, you should be screened for this virus. You are considered at high risk for hepatitis B if:  You were born in a country where hepatitis B is common. Ask your health care provider which countries are considered high risk.  Your parents were born in a high-risk country, and you have not been immunized against hepatitis B (hepatitis B vaccine).  You have HIV or AIDS.  You use needles to inject street drugs.  You live with someone who has hepatitis B.  You have had sex with someone who has hepatitis B.  You get hemodialysis treatment.  You take certain medicines for conditions, including cancer, organ transplantation, and autoimmune conditions. Hepatitis C  Blood testing is recommended for:  Everyone born from 1945 through 1965.  Anyone with known risk factors for hepatitis C. Sexually transmitted infections (STIs)  You should be screened for sexually transmitted infections (STIs) including gonorrhea and chlamydia if:  You are sexually active and are younger than 79 years of age.  You are older than 79 years of age and your health care provider tells you that you are at risk for this type of infection.  Your sexual activity has changed since you were last screened and you are at an increased risk for chlamydia or gonorrhea. Ask your health care provider if you are at risk.  If you do not have HIV, but are at risk, it may be recommended that you take a prescription medicine daily to prevent HIV infection. This is called pre-exposure prophylaxis (PrEP). You are considered at risk if:  You are sexually active and do not regularly use condoms or know the HIV status of your partner(s).  You take drugs by injection.  You are sexually active with a partner who has HIV. Talk with your health care provider about whether you are at high risk of being infected with HIV. If you choose to begin PrEP, you should first be tested for HIV. You should  then be tested every 3 months for as long as you are taking PrEP.  PREGNANCY   If you are premenopausal and you may become pregnant, ask your health care provider about preconception counseling.  If you may become pregnant, take 400 to 800 micrograms (mcg) of folic acid every day.  If you   want to prevent pregnancy, talk to your health care provider about birth control (contraception). OSTEOPOROSIS AND MENOPAUSE   Osteoporosis is a disease in which the bones lose minerals and strength with aging. This can result in serious bone fractures. Your risk for osteoporosis can be identified using a bone density scan.  If you are 70 years of age or older, or if you are at risk for osteoporosis and fractures, ask your health care provider if you should be screened.  Ask your health care provider whether you should take a calcium or vitamin D supplement to lower your risk for osteoporosis.  Menopause may have certain physical symptoms and risks.  Hormone replacement therapy may reduce some of these symptoms and risks. Talk to your health care provider about whether hormone replacement therapy is right for you.  HOME CARE INSTRUCTIONS   Schedule regular health, dental, and eye exams.  Stay current with your immunizations.   Do not use any tobacco products including cigarettes, chewing tobacco, or electronic cigarettes.  If you are pregnant, do not drink alcohol.  If you are breastfeeding, limit how much and how often you drink alcohol.  Limit alcohol intake to no more than 1 drink per day for nonpregnant women. One drink equals 12 ounces of beer, 5 ounces of wine, or 1 ounces of hard liquor.  Do not use street drugs.  Do not share needles.  Ask your health care provider for help if you need support or information about quitting drugs.  Tell your health care provider if you often feel depressed.  Tell your health care provider if you have ever been abused or do not feel safe at  home. Document Released: 01/26/2011 Document Revised: 11/27/2013 Document Reviewed: 06/14/2013 [facility] Patient Information 2015 [place], [place]. This information is not intended to replace advice given to you by your health care provider. Make sure you discuss any questions you have with your health care provider.

## 2014-10-09 NOTE — Assessment & Plan Note (Signed)
Chronic OA of left knee. Pt requests referral to Dr. Marry Guan for evaluation for knee replacement. Referral placed.

## 2014-10-09 NOTE — Assessment & Plan Note (Signed)
Right axillary mass, consistent with lymph node.  No recent change in size per pt report. Mammogram was stable. Will continue to follow up with Dr. Bary Castilla in general surgery.

## 2014-10-09 NOTE — Assessment & Plan Note (Signed)
Last INR 2.1. Continue coumadin same dose and repeat INR in 4 weeks.

## 2014-10-09 NOTE — Progress Notes (Signed)
Subjective:    Patient ID: [name], female    DOB: [birth year], 81 y.o.   MRN: [id number]  HPI  The patient is here for annual Medicare wellness examination and management of other chronic and acute problems.   The risk factors are reflected in the social history.  The roster of all physicians providing medical care to patient - is listed in the Snapshot section of the chart.  Activities of daily living:  The patient is 100% independent in all ADLs: dressing, toileting, feeding as well as independent mobility. Lives at [place].  Home safety : The patient has smoke detectors in the home. They wear seatbelts.  There are no firearms at home. There is no violence in the home.   There is no risks for hepatitis, STDs or HIV. There is no history of blood transfusion. They have no travel history to infectious disease endemic areas of the world.  Surgeon - Dr. Bary Castilla The patient has seen their dentist in the last six month. Dentist - Dr. Lynelle Smoke and Dr. Barry Dienes They have seen their eye doctor in the last year. Opthalmology - [facility] No hearing issues.  Wears hearing aids bilaterally. They have deferred audiologic testing in the last year.   They do not  have excessive sun exposure. Discussed the need for sun protection: hats, long sleeves and use of sunscreen if there is significant sun exposure. Dermatologist - Dr. Evorn Gong, does not plan to go back  Diet: the importance of a healthy diet is discussed. They do have a relatively healthy diet.  The benefits of regular aerobic exercise were discussed. She has not been exercising. Limited by pain in left knee. Followed by orthopedics. Plans to get steroid injection in left knee.  Depression screen: there are no signs or vegative symptoms of depression- irritability, change in appetite, anhedonia, sadness/tearfullness.  Cognitive assessment: the patient manages all their financial and personal affairs and is actively  engaged. They could relate day,date,year and events.  The following portions of the patient's history were reviewed and updated as appropriate: allergies, current medications, past family history, past medical history,  past surgical history, past social history  and problem list.  Visual acuity was not assessed per patient preference since she has regular follow up with her ophthalmologist. Hearing and body mass index were assessed and reviewed.   During the course of the visit the patient was educated and counseled about appropriate screening and preventive services including : fall prevention , diabetes screening, nutrition counseling, colorectal cancer screening, and recommended immunizations.    ACUTE ISSUES: HTN - Pt brings record of BP readings which have ranged from 110s-160s/60-80s. She reports full compliance with medication. No chest pain, dyspnea, palpitations.  Past medical, surgical, family and social history per today's encounter.  Review of Systems  Constitutional: Negative for fever, chills, appetite change, fatigue and unexpected weight change.  Eyes: Negative for visual disturbance.  Respiratory: Negative for shortness of breath.   Cardiovascular: Negative for chest pain and leg swelling.  Gastrointestinal: Negative for nausea, vomiting, abdominal pain, diarrhea and constipation.  Skin: Negative for color change and rash.  Hematological: Negative for adenopathy. Does not bruise/bleed easily.  Psychiatric/Behavioral: Negative for sleep disturbance and dysphoric mood. The patient is not nervous/anxious.        Objective:    BP 162/96 mmHg  Pulse 83  Temp(Src) 97.6 F (36.4 C) (Oral)  Ht 5' (1.524 m)  Wt 149 lb 2 oz (67.643 kg)  BMI  29.12 kg/m2  SpO2 96% Physical Exam  Constitutional: She is oriented to person, place, and time. She appears well-developed and well-nourished. No distress.  HENT:  Head: Normocephalic and atraumatic.  Right Ear: External ear normal.   Left Ear: External ear normal.  Nose: Nose normal.  Mouth/Throat: Oropharynx is clear and moist. No oropharyngeal exudate.  Eyes: Conjunctivae and EOM are normal. Pupils are equal, round, and reactive to light. Right eye exhibits no discharge.  Neck: Normal range of motion. Neck supple. No thyromegaly present.  Cardiovascular: Normal rate, regular rhythm, normal heart sounds and intact distal pulses.  Exam reveals no gallop and no friction rub.   No murmur heard. Pulmonary/Chest: Effort normal. No accessory muscle usage. No respiratory distress. She has no decreased breath sounds. She has no wheezes. She has no rhonchi. She has no rales. Right breast exhibits mass. Right breast exhibits no inverted nipple, no nipple discharge, no skin change and no tenderness.    Abdominal: Soft. Bowel sounds are normal. She exhibits no distension and no mass. There is no tenderness. There is no rebound and no guarding.  Musculoskeletal: Normal range of motion. She exhibits no edema or tenderness.  Lymphadenopathy:    She has no cervical adenopathy.  Neurological: She is alert and oriented to person, place, and time. No cranial nerve deficit. Coordination normal.  Skin: Skin is warm and dry. No rash noted. She is not diaphoretic. No erythema. No pallor.  Psychiatric: She has a normal mood and affect. Her behavior is normal. Judgment and thought content normal.          Assessment & Plan:   Problem List Items Addressed This Visit      Unprioritized   Axillary mass    Right axillary mass, consistent with lymph node.  No recent change in size per pt report. Mammogram was stable. Will continue to follow up with Dr. Bary Castilla in general surgery.      Chronic anticoagulation    Last INR 2.1. Continue coumadin same dose and repeat INR in 4 weeks.      Hypertension    BP Readings from Last 3 Encounters:  10/09/14 162/96  08/31/14 170/78  07/10/14 130/74   Review of BP at home shows BP generally well  controlled for her age. Will continue Ziac. Recent renal function was stable.      Relevant Medications   bisoprolol-hydrochlorothiazide (ZIAC) 5-6.25 MG per tablet   Medicare annual wellness visit, subsequent - Primary    General medical exam normal today including breast exam except as noted. Pelvic exam deferred given age. Mammogram UTD and reviewed. Bone density testing pending. Colonoscopy UTD. Prevnar given today. Labs reviewed from 08/2014. Encouraged healthy diet and physical activity.      Osteoarthritis of left knee    Chronic OA of left knee. Pt requests referral to Dr. Marry Guan for evaluation for knee replacement. Referral placed.      Relevant Orders   Ambulatory referral to Orthopedic Surgery       Return in about 3 months (around 01/09/2015) for Recheck.

## 2014-10-09 NOTE — Assessment & Plan Note (Signed)
BP Readings from Last 3 Encounters:  10/09/14 162/96  08/31/14 170/78  07/10/14 130/74   Review of BP at home shows BP generally well controlled for her age. Will continue Ziac. Recent renal function was stable.

## 2014-10-09 NOTE — Assessment & Plan Note (Signed)
General medical exam normal today including breast exam except as noted. Pelvic exam deferred given age. Mammogram UTD and reviewed. Bone density testing pending. Colonoscopy UTD. Prevnar given today. Labs reviewed from 08/2014. Encouraged healthy diet and physical activity.

## 2014-10-09 NOTE — Progress Notes (Signed)
Pre visit review using our clinic review tool, if applicable. No additional management support is needed unless otherwise documented below in the visit note. 

## 2014-10-10 ENCOUNTER — Other Ambulatory Visit: Payer: Self-pay | Admitting: *Deleted

## 2014-10-10 ENCOUNTER — Telehealth: Payer: Self-pay | Admitting: Internal Medicine

## 2014-10-10 MED ORDER — DIPHENOXYLATE-ATROPINE 2.5-0.025 MG PO TABS: 1.0000 | ORAL_TABLET | Freq: Four times a day (QID) | ORAL | Status: DC | PRN

## 2014-10-10 NOTE — Telephone Encounter (Signed)
diphenoxylate-atropine (LOMOTIL) 2.5-0.025 MG per tablet

## 2014-10-10 NOTE — Telephone Encounter (Signed)
error 

## 2014-10-10 NOTE — Telephone Encounter (Signed)
rx sent

## 2014-10-11 ENCOUNTER — Ambulatory Visit: Payer: Self-pay | Admitting: Internal Medicine

## 2014-10-11 ENCOUNTER — Telehealth: Payer: Self-pay | Admitting: Internal Medicine

## 2014-10-11 DIAGNOSIS — M8580 Other specified disorders of bone density and structure, unspecified site: Secondary | ICD-10-CM | POA: Diagnosis not present

## 2014-10-11 DIAGNOSIS — M8589 Other specified disorders of bone density and structure, multiple sites: Secondary | ICD-10-CM | POA: Diagnosis not present

## 2014-10-11 NOTE — Telephone Encounter (Signed)
Bone density testing shows osteopenia which is relatively stable from previous. However, current guidelines would recommend starting medication to help prevent fractures. I would recommend we set up a 10min follow up visit to discuss pros and cons of medication.

## 2014-10-12 NOTE — Telephone Encounter (Signed)
Pt states she is currently taking the Prolia injections, states that she will discuss with you at her next appt

## 2014-10-23 ENCOUNTER — Ambulatory Visit: Payer: Medicare Other | Admitting: Internal Medicine

## 2014-10-25 ENCOUNTER — Telehealth: Payer: Self-pay | Admitting: Internal Medicine

## 2014-10-25 NOTE — Telephone Encounter (Signed)
Please start prolia process, pt should be due at the end of next month.  Thank you.

## 2014-10-25 NOTE — Telephone Encounter (Signed)
Pt request to have a prolia shot. Please advise/msn

## 2014-10-26 NOTE — Telephone Encounter (Signed)
I have electronically sent pt's info for Prolia insurance verification and will notify you once I have a response. Thank you. °

## 2014-11-01 DIAGNOSIS — Z7901 Long term (current) use of anticoagulants: Secondary | ICD-10-CM | POA: Diagnosis not present

## 2014-11-01 LAB — POCT INR: INR: 2 — AB (ref <=1.1)

## 2014-11-01 LAB — PROTIME-INR: Protime: 21.3 seconds — AB (ref 10.0–13.8)

## 2014-11-02 ENCOUNTER — Encounter: Payer: Self-pay | Admitting: *Deleted

## 2014-11-05 ENCOUNTER — Other Ambulatory Visit: Payer: Self-pay | Admitting: *Deleted

## 2014-11-05 ENCOUNTER — Telehealth: Payer: Self-pay | Admitting: Internal Medicine

## 2014-11-05 MED ORDER — WARFARIN SODIUM 3 MG PO TABS: ORAL_TABLET | ORAL | Status: DC

## 2014-11-05 MED ORDER — WARFARIN SODIUM 1 MG PO TABS: ORAL_TABLET | ORAL | Status: DC

## 2014-11-05 NOTE — Telephone Encounter (Signed)
Please order prolia injection for pt.  Pt is not due until 11/23/14, scheduling pt appt for 11/26/14 for injection and follow up appt with DrWalker

## 2014-11-05 NOTE — Telephone Encounter (Signed)
Notified pt. 

## 2014-11-05 NOTE — Telephone Encounter (Signed)
Ordered

## 2014-11-05 NOTE — Telephone Encounter (Signed)
INR was at goal of 2. Continue Coumadin same dose. Repeat INR in 4 weeks.

## 2014-11-05 NOTE — Telephone Encounter (Signed)
I have rec'd pt's insurance verification for Prolia.  Ms. Stodghill will have an estimated responsibility of $0.  Please advise her this is an estimate and we will not know an exact amt until both her insurances have paid.  I have sent a copy of the summary of benefits to be scanned into her chart.  If you have any questions, please let me know. Thank you.

## 2014-11-06 ENCOUNTER — Encounter: Payer: Self-pay | Admitting: Internal Medicine

## 2014-11-08 ENCOUNTER — Telehealth: Payer: Self-pay

## 2014-11-08 NOTE — Telephone Encounter (Signed)
I would recommend that the patient discuss this with Dr. Clydell Hakim office. They will have to agree to take her back as a pt.

## 2014-11-08 NOTE — Telephone Encounter (Signed)
I called the patient to inquire about her apt with Dr.Hooten at [facility] clinic.  [facility] clinic called and stated they had to cancel her apt due finding out the patient had went to [facility] ortho and received knee injections.  She went to [facility] ortho about three weeks ago for knee injections.    After speaking the patient, she is hoping to still see Dr.Hooten for her knee issues.  (she states her knee is still causing her problems).

## 2014-11-13 ENCOUNTER — Encounter: Payer: Self-pay | Admitting: Internal Medicine

## 2014-11-13 NOTE — Op Note (Signed)
PATIENT NAME:  [name], [name] MR#:  [id number] DATE OF BIRTH:  [birth year]  DATE OF PROCEDURE:  07/22/2012  PREOPERATIVE DIAGNOSIS: Left breast cancer.   POSTOPERATIVE DIAGNOSIS:  Left breast cancer.  OPERATIVE PROCEDURE: Left simple mastectomy.   SURGEON: Robert Bellow, MD.   ANESTHESIA: General by LMA.   ESTIMATED BLOOD LOSS: Less than 50 mL.    INDICATIONS FOR SURGERY: This 81 year old woman had an abnormal mammogram and subsequent core biopsy, which showed evidence of invasive mammary carcinoma. Given her options for management, she desired to proceed to mastectomy.   PROCEDURE IN DETAIL:  The patient underwent general anesthesia. A total of 6 mL of methylene blue diluted 1:2 with normal saline was injected in the subareolar plexus. The breast was then prepped with ChloraPrep and draped. An elliptical incision was outlined and the skin incised sharply. The remaining dissection completed with electrocautery. Hemostasis was with 3-0 Vicryl ties. The breast was elevated to the clavicle superiorly, sternum medially, serratus muscle laterally and the rectus fascia inferiorly. The fascia of the pectoralis muscle was taken with the specimen. There was at least one mildly enlarged, but not stained lymph node included in the specimen. No lymphatic channels were identified. The soft tissue between the breast and the axillary fat was divided between 2-0 Vicryl ties. The breast was sent fresh, per protocol to Pathology. The area was irrigated with water and good hemostasis noted. As the patient will be resuming Lovenox tomorrow, as part of her bridging therapy, 5 mL of spray thrombin was used on the wound to minimize any post-procedure bleeding. A 19-gauge Blake drain was brought out through a separate stab wound incision on the medial inferior aspect of the flap and anchored in        place with 3-0 nylon. The flap was approximated with a running 2-0 Vicryl deep dermal suture, in two segments.  Benzoin and SteriStrips were applied. Telfa, Fluff gauze, Kerlix and an Ace wrap was then placed. The patient tolerated the procedure well and was taken to the recovery room in stable condition.   ____________________________ Robert Bellow, MD jwb:eg D: 07/24/2012 07:54:17 ET T: 07/24/2012 19:56:32 ET JOB#: [id number]  cc:  1.  Robert Bellow, MD, <Dictator> 2.  Eduard Clos. Gilford Rile, MD    Kimbria Camposano Amedeo Kinsman MD ELECTRONICALLY SIGNED 07/25/2012 14:17

## 2014-11-22 ENCOUNTER — Telehealth: Payer: Self-pay

## 2014-11-22 NOTE — Telephone Encounter (Signed)
The patient is hoping to get her proleia (spelling?) shot ,but she is wondering if it is in the office and available.

## 2014-11-23 NOTE — Telephone Encounter (Signed)
Notified pt that Prolia is here in the office ready for her when she comes for her appt on  11/26/14

## 2014-11-26 ENCOUNTER — Ambulatory Visit (INDEPENDENT_AMBULATORY_CARE_PROVIDER_SITE_OTHER): Payer: Medicare Other | Admitting: Internal Medicine

## 2014-11-26 ENCOUNTER — Encounter: Payer: Self-pay | Admitting: Internal Medicine

## 2014-11-26 VITALS — BP 135/79 | HR 76 | Temp 97.5°F | Ht 60.0 in | Wt 146.5 lb

## 2014-11-26 DIAGNOSIS — C50919 Malignant neoplasm of unspecified site of unspecified female breast: Secondary | ICD-10-CM

## 2014-11-26 DIAGNOSIS — M8580 Other specified disorders of bone density and structure, unspecified site: Secondary | ICD-10-CM | POA: Diagnosis not present

## 2014-11-26 DIAGNOSIS — R6889 Other general symptoms and signs: Secondary | ICD-10-CM

## 2014-11-26 DIAGNOSIS — Z7901 Long term (current) use of anticoagulants: Secondary | ICD-10-CM | POA: Diagnosis not present

## 2014-11-26 LAB — PROTIME-INR
INR: 3 ratio — ABNORMAL HIGH (ref 0.8–1.0)
Prothrombin Time: 32.1 s — ABNORMAL HIGH (ref 9.6–13.1)

## 2014-11-26 MED ORDER — DENOSUMAB 60 MG/ML ~~LOC~~ SOLN
60.0000 mg | Freq: Once | SUBCUTANEOUS | Status: AC
  Administered 2014-11-26: 60 mg via SUBCUTANEOUS

## 2014-11-26 NOTE — Progress Notes (Signed)
Pre visit review using our clinic review tool, if applicable. No additional management support is needed unless otherwise documented below in the visit note. 

## 2014-11-26 NOTE — Progress Notes (Signed)
Subjective:    Patient ID: [name], female    DOB: 1926/02/05, 81 y.o.   MRN: [id number]  HPI  81YO female presents for follow up.  Bone density testing in 09/2014 showed T-Score of -1.1, which was improved compared to previous score in 2014 of -1.7. Pt has been on Prolia. No side effects noted from Prolia.  Chronic anticoagulation- Continues on Coumadin 63m daily. Due for INR. No recent issues with bleeding or bruising.  For the last month, in the afternoons, feels "chilled."  Has air condition running at home. Symptoms last 30-473m. Then resolves without intervention.  [facility] to a "[facility]." I encouraged her to follow up with an administrative person at [facility] Past medical, surgical, family and social history per today's encounter.  Review of Systems  Constitutional: Positive for chills. Negative for fever, appetite change, fatigue and unexpected weight change.  Eyes: Negative for visual disturbance.  Respiratory: Negative for shortness of breath.   Cardiovascular: Negative for chest pain and leg swelling.  Gastrointestinal: Negative for abdominal pain.  Endocrine: Positive for cold intolerance.  Skin: Negative for color change and rash.  Hematological: Negative for adenopathy. Does not bruise/bleed easily.  Psychiatric/Behavioral: Negative for dysphoric mood. The patient is not nervous/anxious.        Objective:    BP 135/79 mmHg  Pulse 76  Temp(Src) 97.5 F (36.4 C) (Oral)  Ht 5' (1.524 m)  Wt 146 lb 8 oz (66.452 kg)  BMI 28.61 kg/m2  SpO2 94% Physical Exam  Constitutional: She is oriented to person, place, and time. She appears well-developed and well-nourished. No distress.  HENT:  Head: Normocephalic and atraumatic.  Right Ear: External ear normal.  Left Ear: External ear  normal.  Nose: Nose normal.  Mouth/Throat: Oropharynx is clear and moist. No oropharyngeal exudate.  Eyes: Conjunctivae are normal. Pupils are equal, round, and reactive to light. Right eye exhibits no discharge. Left eye exhibits no discharge. No scleral icterus.  Neck: Normal range of motion. Neck supple. No tracheal deviation present. No thyromegaly present.  Cardiovascular: Normal rate, regular rhythm, normal heart sounds and intact distal pulses.  Exam reveals no gallop and no friction rub.   No murmur heard. Pulmonary/Chest: Effort normal and breath sounds normal. No respiratory distress. She has no wheezes. She has no rales. She exhibits no tenderness.  Musculoskeletal: Normal range of motion. She exhibits no edema or tenderness.  Lymphadenopathy:    She has no cervical adenopathy.  Neurological: She is alert and oriented to person, place, and time. No cranial nerve deficit. She exhibits normal muscle tone. Coordination normal.  Skin: Skin is warm and dry. No rash noted. She is not diaphoretic. No erythema. No pallor.  Psychiatric: She has a normal mood and affect. Her behavior is normal. Judgment and thought content normal.          Assessment & Plan:   Problem List Items Addressed This Visit      Unprioritized   Breast cancer    Reviewed some studies she brought about HER-2 receptor antagonists. Reviewed her path which showed her tumor was not HER-2 expressing. Encouraged her to follow up with oncology.      Chronic anticoagulation    Check INR with labs today.      Relevant Orders  Protime-INR   Cold intolerance    Suspect cold intolerance related to age and medication use. Recent CBC TSH were normal.      Osteopenia - Primary    Bone density testing shows improvement in T-score. Will continue Prolia. Recent electrolytes normal. Continue Vit D .          Return in about 3 months (around 02/26/2015) for Recheck.

## 2014-11-26 NOTE — Assessment & Plan Note (Signed)
Check INR with labs today.

## 2014-11-26 NOTE — Assessment & Plan Note (Signed)
Reviewed some studies she brought about HER-2 receptor antagonists. Reviewed her path which showed her tumor was not HER-2 expressing. Encouraged her to follow up with oncology.

## 2014-11-26 NOTE — Patient Instructions (Signed)
Labs today.  Prolia today.  Consider starting a Vit B12 supplement 2000-5067mcg daily, dissolvable tablet.  Follow up in 3 months.

## 2014-11-26 NOTE — Assessment & Plan Note (Signed)
Bone density testing shows improvement in T-score. Will continue Prolia. Recent electrolytes normal. Continue Vit D .

## 2014-11-26 NOTE — Addendum Note (Signed)
Addended by: Vernetta Honey on: 11/26/2014 01:50 PM   Modules accepted: Orders

## 2014-11-26 NOTE — Assessment & Plan Note (Signed)
Suspect cold intolerance related to age and medication use. Recent CBC TSH were normal.

## 2014-12-12 DIAGNOSIS — Z7901 Long term (current) use of anticoagulants: Secondary | ICD-10-CM | POA: Diagnosis not present

## 2014-12-13 ENCOUNTER — Encounter: Payer: Self-pay | Admitting: *Deleted

## 2014-12-13 LAB — PROTIME-INR: Protime: 24.5 seconds — AB (ref 10.0–13.8)

## 2014-12-13 LAB — POCT INR: INR: 2.3 — AB (ref 0.9–1.1)

## 2014-12-14 ENCOUNTER — Telehealth: Payer: Self-pay | Admitting: Internal Medicine

## 2014-12-14 NOTE — Telephone Encounter (Signed)
Coumadin level was at goal 2.3 Continue coumadin same dose and recheck INR in 4 weeks.

## 2014-12-17 ENCOUNTER — Telehealth: Payer: Self-pay

## 2014-12-17 NOTE — Telephone Encounter (Signed)
Notified pt. 

## 2014-12-17 NOTE — Telephone Encounter (Signed)
The pt called hoping to get her pt/inr results and instructions.  Pt callback - [phone]

## 2014-12-20 DIAGNOSIS — Z96651 Presence of right artificial knee joint: Secondary | ICD-10-CM | POA: Diagnosis not present

## 2014-12-20 DIAGNOSIS — M179 Osteoarthritis of knee, unspecified: Secondary | ICD-10-CM | POA: Diagnosis not present

## 2014-12-20 DIAGNOSIS — M1712 Unilateral primary osteoarthritis, left knee: Secondary | ICD-10-CM | POA: Diagnosis not present

## 2014-12-26 ENCOUNTER — Other Ambulatory Visit: Payer: Self-pay | Admitting: Internal Medicine

## 2014-12-26 ENCOUNTER — Telehealth: Payer: Self-pay | Admitting: Internal Medicine

## 2014-12-26 ENCOUNTER — Other Ambulatory Visit: Payer: Self-pay | Admitting: *Deleted

## 2014-12-26 MED ORDER — ALPRAZOLAM 0.5 MG PO TABS: ORAL_TABLET | ORAL | Status: DC

## 2014-12-26 NOTE — Telephone Encounter (Signed)
Last OV 5.2.16, next OV 6.21.16. Please advise.

## 2014-12-26 NOTE — Telephone Encounter (Signed)
Pt aware Rx will be faxed to [facility]

## 2014-12-26 NOTE — Telephone Encounter (Signed)
Fine to fill. 

## 2014-12-26 NOTE — Telephone Encounter (Signed)
Pt wants Rx to go to both mail order and local pharmacy.  Please advise

## 2014-12-26 NOTE — Telephone Encounter (Signed)
Last OV 5.2.16, last refill 1.27.16.  Please advise refill

## 2014-12-26 NOTE — Telephone Encounter (Signed)
rx faxed

## 2014-12-26 NOTE — Telephone Encounter (Signed)
Pt. Is requesting refill on xanax with home delivery. Or [name] [place] Pt call back [phone]

## 2015-01-10 DIAGNOSIS — Z7901 Long term (current) use of anticoagulants: Secondary | ICD-10-CM | POA: Diagnosis not present

## 2015-01-10 LAB — POCT INR: INR: 2.7 — AB (ref 0.9–1.1)

## 2015-01-15 ENCOUNTER — Encounter: Payer: Self-pay | Admitting: Internal Medicine

## 2015-01-15 ENCOUNTER — Ambulatory Visit (INDEPENDENT_AMBULATORY_CARE_PROVIDER_SITE_OTHER): Payer: Medicare Other | Admitting: Internal Medicine

## 2015-01-15 VITALS — BP 149/84 | HR 65 | Temp 97.7°F | Ht 60.0 in | Wt 148.2 lb

## 2015-01-15 DIAGNOSIS — C50919 Malignant neoplasm of unspecified site of unspecified female breast: Secondary | ICD-10-CM

## 2015-01-15 DIAGNOSIS — Z7901 Long term (current) use of anticoagulants: Secondary | ICD-10-CM

## 2015-01-15 DIAGNOSIS — R197 Diarrhea, unspecified: Secondary | ICD-10-CM | POA: Insufficient documentation

## 2015-01-15 DIAGNOSIS — I10 Essential (primary) hypertension: Secondary | ICD-10-CM | POA: Diagnosis not present

## 2015-01-15 NOTE — Progress Notes (Signed)
Subjective:    Patient ID: [name], female    DOB: 1926-03-10, 81 y.o.   MRN: [id number]  HPI  81YO female presents for follow up.  Last seen 11/26/2014 for osteopenia.  Chronic anticoagulation - Taking Coumadin 3mg  alternating with 4mg . INR 2.7. No recent bleeding or bruising.  Having occasional abdominal cramping and diarrhea. No change in diet. Attributes this to use of Letrozole. Trying to follow a blander diet. Does not eat out. NO persistent symptoms of diarrhea or abdominal pain. No NV. Taking Lomotil about once per week with improvement in symptoms.  Past medical, surgical, family and social history per today's encounter.  Review of Systems  Constitutional: Negative for fever, chills, appetite change, fatigue and unexpected weight change.  Eyes: Negative for visual disturbance.  Respiratory: Negative for shortness of breath.   Cardiovascular: Negative for chest pain and leg swelling.  Gastrointestinal: Positive for diarrhea. Negative for nausea, vomiting, abdominal pain, constipation and blood in stool.  Musculoskeletal: Negative for myalgias and arthralgias.  Skin: Negative for color change and rash.  Hematological: Negative for adenopathy. Does not bruise/bleed easily.  Psychiatric/Behavioral: Negative for dysphoric mood. The patient is not nervous/anxious.        Objective:    BP 149/84 mmHg  Pulse 65  Temp(Src) 97.7 F (36.5 C) (Oral)  Ht 5' (1.524 m)  Wt 148 lb 4 oz (67.246 kg)  BMI 28.95 kg/m2  SpO2 96% Physical Exam  Constitutional: She is oriented to person, place, and time. She appears well-developed and well-nourished. No distress.  HENT:  Head: Normocephalic and atraumatic.  Right Ear: External ear normal.  Left Ear: External ear normal.  Nose: Nose normal.  Mouth/Throat: Oropharynx is clear and moist. No oropharyngeal exudate.  Eyes: Conjunctivae are normal. Pupils are equal, round, and reactive to light. Right eye exhibits no discharge. Left  eye exhibits no discharge. No scleral icterus.  Neck: Normal range of motion. Neck supple. No tracheal deviation present. No thyromegaly present.  Cardiovascular: Normal rate, regular rhythm, normal heart sounds and intact distal pulses.  Exam reveals no gallop and no friction rub.   No murmur heard. Pulmonary/Chest: Effort normal and breath sounds normal. No respiratory distress. She has no wheezes. She has no rales. She exhibits no tenderness.  Musculoskeletal: Normal range of motion. She exhibits no edema or tenderness.  Lymphadenopathy:    She has no cervical adenopathy.  Neurological: She is alert and oriented to person, place, and time. No cranial nerve deficit. She exhibits normal muscle tone. Coordination normal.  Skin: Skin is warm and dry. No rash noted. She is not diaphoretic. No erythema. No pallor.  Psychiatric: She has a normal mood and affect. Her behavior is normal. Judgment and thought content normal.          Assessment & Plan:   Problem List Items Addressed This Visit      Unprioritized   Breast cancer    Encouraged her to continue Letrozole and follow up with her oncologist as scheduled.      Chronic anticoagulation    INR 2.7 recently. Discussed goal of 2-3. Continue coumadin same dose. Repeat INR 1 month.      Diarrhea    Intermittent watery diarrhea. Symptoms c/w IBS. Recommended FODMAP diet. Continue to use prn Lomotil. Follow up prn.      Hypertension - Primary    BP Readings from Last 3 Encounters:  01/15/15 149/84  11/26/14 135/79  10/09/14 162/96   BP generally has been well  controlled. Continue current medication.          Return in about 3 months (around 04/17/2015) for Recheck.

## 2015-01-15 NOTE — Assessment & Plan Note (Signed)
Encouraged her to continue Letrozole and follow up with her oncologist as scheduled.

## 2015-01-15 NOTE — Patient Instructions (Signed)
Consider starting a FODMAP diet to help with diarrhea.  Follow up in 3 months or sooner as needed.

## 2015-01-15 NOTE — Assessment & Plan Note (Signed)
INR 2.7 recently. Discussed goal of 2-3. Continue coumadin same dose. Repeat INR 1 month.

## 2015-01-15 NOTE — Assessment & Plan Note (Signed)
BP Readings from Last 3 Encounters:  01/15/15 149/84  11/26/14 135/79  10/09/14 162/96   BP generally has been well controlled. Continue current medication.

## 2015-01-15 NOTE — Progress Notes (Signed)
Pre visit review using our clinic review tool, if applicable. No additional management support is needed unless otherwise documented below in the visit note. 

## 2015-01-15 NOTE — Assessment & Plan Note (Signed)
Intermittent watery diarrhea. Symptoms c/w IBS. Recommended FODMAP diet. Continue to use prn Lomotil. Follow up prn.

## 2015-01-16 ENCOUNTER — Telehealth: Payer: Self-pay | Admitting: Internal Medicine

## 2015-01-16 NOTE — Telephone Encounter (Signed)
FYI: pt wanted to let Dr. Gilford Rile know the amount of Coumadin that she takes daily. Monday, Wednesday and Friday 4mg  Coumadin daily. Tuesday, Thursday, Saturday and Sunday 3mg  tab daily of Coumadin. Please advise pt/msn

## 2015-01-17 NOTE — Telephone Encounter (Signed)
OK. Continue current dosing.

## 2015-01-17 NOTE — Telephone Encounter (Signed)
Please see below.

## 2015-01-31 DIAGNOSIS — M79609 Pain in unspecified limb: Secondary | ICD-10-CM | POA: Diagnosis not present

## 2015-01-31 DIAGNOSIS — I872 Venous insufficiency (chronic) (peripheral): Secondary | ICD-10-CM | POA: Diagnosis not present

## 2015-01-31 DIAGNOSIS — I8310 Varicose veins of unspecified lower extremity with inflammation: Secondary | ICD-10-CM | POA: Diagnosis not present

## 2015-01-31 DIAGNOSIS — I87099 Postthrombotic syndrome with other complications of unspecified lower extremity: Secondary | ICD-10-CM | POA: Diagnosis not present

## 2015-02-06 DIAGNOSIS — Z7901 Long term (current) use of anticoagulants: Secondary | ICD-10-CM | POA: Diagnosis not present

## 2015-02-06 LAB — POCT INR: INR: 1.7

## 2015-02-08 ENCOUNTER — Telehealth: Payer: Self-pay | Admitting: Internal Medicine

## 2015-02-08 NOTE — Telephone Encounter (Signed)
PAtient verbalized understanding and will get her INR repeated next week.

## 2015-02-08 NOTE — Telephone Encounter (Signed)
Ok. Let's have her just repeat INR next week then

## 2015-02-08 NOTE — Telephone Encounter (Signed)
Patient stated that she sent you a message regarding how she takes her medication last month.  She has been taking the coumadin as you prescribed. Changed manufacturing companies this past month, her home delivery was late, so she had to get local supply that seems to make a difference to her (major difference in taking 3 or 4mg  tablets from different companies. )  Please advise.

## 2015-02-08 NOTE — Telephone Encounter (Signed)
INR was low at 1.6. Has she been taking her medication?

## 2015-02-09 DIAGNOSIS — H6121 Impacted cerumen, right ear: Secondary | ICD-10-CM | POA: Diagnosis not present

## 2015-02-13 DIAGNOSIS — Z7901 Long term (current) use of anticoagulants: Secondary | ICD-10-CM | POA: Diagnosis not present

## 2015-02-13 LAB — POCT INR: INR: 2.1

## 2015-02-14 ENCOUNTER — Telehealth: Payer: Self-pay | Admitting: Internal Medicine

## 2015-02-14 NOTE — Telephone Encounter (Signed)
Coumadin level was at goal 2.1. Continue coumadin same dose. Repeat level in 4 weeks.

## 2015-02-18 NOTE — Telephone Encounter (Signed)
Notified pt. 

## 2015-02-21 DIAGNOSIS — H01003 Unspecified blepharitis right eye, unspecified eyelid: Secondary | ICD-10-CM | POA: Diagnosis not present

## 2015-02-22 DIAGNOSIS — D485 Neoplasm of uncertain behavior of skin: Secondary | ICD-10-CM | POA: Diagnosis not present

## 2015-02-22 DIAGNOSIS — L821 Other seborrheic keratosis: Secondary | ICD-10-CM | POA: Diagnosis not present

## 2015-02-22 DIAGNOSIS — L719 Rosacea, unspecified: Secondary | ICD-10-CM | POA: Diagnosis not present

## 2015-02-22 DIAGNOSIS — Z85828 Personal history of other malignant neoplasm of skin: Secondary | ICD-10-CM | POA: Diagnosis not present

## 2015-02-28 ENCOUNTER — Encounter: Payer: Self-pay | Admitting: Internal Medicine

## 2015-02-28 ENCOUNTER — Ambulatory Visit (INDEPENDENT_AMBULATORY_CARE_PROVIDER_SITE_OTHER): Payer: Medicare Other | Admitting: Internal Medicine

## 2015-02-28 VITALS — BP 147/77 | HR 69 | Temp 97.6°F | Ht 61.0 in | Wt 147.0 lb

## 2015-02-28 DIAGNOSIS — I10 Essential (primary) hypertension: Secondary | ICD-10-CM | POA: Diagnosis not present

## 2015-02-28 DIAGNOSIS — J302 Other seasonal allergic rhinitis: Secondary | ICD-10-CM | POA: Diagnosis not present

## 2015-02-28 DIAGNOSIS — Z7901 Long term (current) use of anticoagulants: Secondary | ICD-10-CM

## 2015-02-28 DIAGNOSIS — C50919 Malignant neoplasm of unspecified site of unspecified female breast: Secondary | ICD-10-CM

## 2015-02-28 LAB — COMPREHENSIVE METABOLIC PANEL
ALT: 11 U/L (ref 0–35)
AST: 15 U/L (ref 0–37)
Albumin: 4.1 g/dL (ref 3.5–5.2)
Alkaline Phosphatase: 44 U/L (ref 39–117)
BUN: 14 mg/dL (ref 6–23)
CO2: 30 mEq/L (ref 19–32)
Calcium: 9.5 mg/dL (ref 8.4–10.5)
Chloride: 103 mEq/L (ref 96–112)
Creatinine, Ser: 0.94 mg/dL (ref 0.40–1.20)
GFR: 59.82 mL/min — ABNORMAL LOW (ref >60.00)
Glucose, Bld: 90 mg/dL (ref 70–99)
Potassium: 4.4 mEq/L (ref 3.5–5.1)
Sodium: 140 mEq/L (ref 135–145)
Total Bilirubin: 0.7 mg/dL (ref 0.2–1.2)
Total Protein: 6.9 g/dL (ref 6.0–8.3)

## 2015-02-28 LAB — PROTIME-INR
INR: 2.1 ratio — ABNORMAL HIGH (ref 0.8–1.0)
Prothrombin Time: 22.4 s — ABNORMAL HIGH (ref 9.6–13.1)

## 2015-02-28 MED ORDER — ALPRAZOLAM 0.5 MG PO TABS: ORAL_TABLET | ORAL | Status: DC

## 2015-02-28 NOTE — Patient Instructions (Addendum)
Labs today.  Continue current medications.  Follow up in 3 months.

## 2015-02-28 NOTE — Assessment & Plan Note (Signed)
BP Readings from Last 3 Encounters:  02/28/15 147/77  01/15/15 149/84  11/26/14 135/79   BP well controlled for her age. Continue current medications. Renal function with labs.

## 2015-02-28 NOTE — Assessment & Plan Note (Signed)
Symptoms of allergic rhinitis recently worsening. Recommended adding OTC Flonase or nasacort. Continue Singulair. Follow up prn.

## 2015-02-28 NOTE — Progress Notes (Signed)
Subjective:    Patient ID: [name], female    DOB: March 17, 1926, 81 y.o.   MRN: [id number]  HPI  81YO female presents for follow up.  Started feeling poorly a few days ago. Noticed she had not been drinking as much water. Trying to remember to drink. Notes some runny nose, when in the building at [facility]. Continues to take Singulair. No fever, chills, cough, sinus pressure. No dyspnea.  Compliant with medications. No recent easy bleeding or bruising.   Wt Readings from Last 3 Encounters:  02/28/15 147 lb (66.679 kg)  01/15/15 148 lb 4 oz (67.246 kg)  11/26/14 146 lb 8 oz (66.452 kg)     Past medical, surgical, family and social history per today's encounter.   Review of Systems  Constitutional: Negative for fever, chills, appetite change, fatigue and unexpected weight change.  HENT: Positive for congestion, postnasal drip, rhinorrhea and sneezing. Negative for ear pain and sore throat.   Eyes: Negative for visual disturbance.  Respiratory: Negative for cough, shortness of breath and wheezing.   Cardiovascular: Negative for chest pain and leg swelling.  Gastrointestinal: Negative for abdominal pain.  Skin: Negative for color change and rash.  Hematological: Negative for adenopathy. Does not bruise/bleed easily.  Psychiatric/Behavioral: Negative for dysphoric mood. The patient is not nervous/anxious.        Objective:    BP 147/77 mmHg  Pulse 69  Temp(Src) 97.6 F (36.4 C) (Oral)  Ht 5\' 1"  (1.549 m)  Wt 147 lb (66.679 kg)  BMI 27.79 kg/m2  SpO2 95% Physical Exam  Constitutional: She is oriented to person, place, and time. She appears well-developed and well-nourished. No distress.  HENT:  Head: Normocephalic and atraumatic.  Right Ear: External ear normal.  Left Ear: External ear normal.  Nose: Nose normal.  Mouth/Throat: Oropharynx is clear and moist. No oropharyngeal exudate.  Eyes: Conjunctivae are normal. Pupils are equal, round, and reactive to light.  Right eye exhibits no discharge. Left eye exhibits no discharge. No scleral icterus.  Neck: Normal range of motion. Neck supple. No tracheal deviation present. No thyromegaly present.  Cardiovascular: Normal rate, regular rhythm, normal heart sounds and intact distal pulses.  Exam reveals no gallop and no friction rub.   No murmur heard. Pulmonary/Chest: Effort normal and breath sounds normal. No respiratory distress. She has no wheezes. She has no rales. She exhibits no tenderness.  Musculoskeletal: Normal range of motion. She exhibits no edema or tenderness.  Lymphadenopathy:    She has no cervical adenopathy.  Neurological: She is alert and oriented to person, place, and time. No cranial nerve deficit. She exhibits normal muscle tone. Coordination normal.  Skin: Skin is warm and dry. No rash noted. She is not diaphoretic. No erythema. No pallor.  Psychiatric: She has a normal mood and affect. Her behavior is normal. Judgment and thought content normal.          Assessment & Plan:   Problem List Items Addressed This Visit      Unprioritized   Breast cancer    Continue Letrozole. Encouraged regular follow up as scheduled with Dr. Bary Castilla.      Relevant Medications   ALPRAZolam (XANAX) 0.5 MG tablet   Chronic anticoagulation    Recheck INR with labs today. Goal INR 2-3.      Relevant Orders   Protime-INR   Hypertension - Primary    BP Readings from Last 3 Encounters:  02/28/15 147/77  01/15/15 149/84  11/26/14 135/79   BP  well controlled for her age. Continue current medications. Renal function with labs.      Relevant Orders   Comprehensive metabolic panel   Seasonal allergic rhinitis    Symptoms of allergic rhinitis recently worsening. Recommended adding OTC Flonase or nasacort. Continue Singulair. Follow up prn.          Return in about 3 months (around 05/31/2015) for Recheck.

## 2015-02-28 NOTE — Progress Notes (Signed)
Pre visit review using our clinic review tool, if applicable. No additional management support is needed unless otherwise documented below in the visit note. 

## 2015-02-28 NOTE — Assessment & Plan Note (Signed)
Continue Letrozole. Encouraged regular follow up as scheduled with Dr. Bary Castilla.

## 2015-02-28 NOTE — Assessment & Plan Note (Signed)
Recheck INR with labs today. Goal INR 2-3.

## 2015-03-04 ENCOUNTER — Telehealth: Payer: Self-pay | Admitting: Internal Medicine

## 2015-03-04 NOTE — Telephone Encounter (Signed)
Pt would like  to know what her blood levels are for taking Coumadin. She states it was drawn last Thursday. Have Great day!

## 2015-03-04 NOTE — Telephone Encounter (Signed)
Spoke with pt, advised of lab results.

## 2015-03-05 ENCOUNTER — Encounter: Payer: Self-pay | Admitting: Internal Medicine

## 2015-03-08 ENCOUNTER — Telehealth: Payer: Self-pay | Admitting: *Deleted

## 2015-03-08 NOTE — Telephone Encounter (Signed)
Patient has requested to have her lab results faxed over to the health care nurse for [employer]. The labs pertain to her coumadin. -Thanks

## 2015-03-08 NOTE — Telephone Encounter (Signed)
[facility] and was able to retrieve the Correct fax # [phone]  Lab results sent.

## 2015-03-08 NOTE — Telephone Encounter (Signed)
Fax [phone]  Gara Kroner, RN at [facility]

## 2015-03-22 ENCOUNTER — Other Ambulatory Visit: Payer: Self-pay | Admitting: Internal Medicine

## 2015-04-02 DIAGNOSIS — Z7901 Long term (current) use of anticoagulants: Secondary | ICD-10-CM | POA: Diagnosis not present

## 2015-04-02 LAB — PROTIME-INR: INR: 1.9 — AB (ref 0.9–1.1)

## 2015-04-03 ENCOUNTER — Telehealth: Payer: Self-pay | Admitting: Internal Medicine

## 2015-04-03 NOTE — Telephone Encounter (Signed)
Coumadin level was just below goal with INR 1.9. I would recommend continuing same dose and repeating INR in 2-4 weeks.

## 2015-04-04 ENCOUNTER — Other Ambulatory Visit: Payer: Self-pay | Admitting: *Deleted

## 2015-04-04 MED ORDER — LETROZOLE 2.5 MG PO TABS: 2.5000 mg | ORAL_TABLET | Freq: Every day | ORAL | Status: DC

## 2015-04-04 MED ORDER — WARFARIN SODIUM 3 MG PO TABS: ORAL_TABLET | ORAL | Status: DC

## 2015-04-04 NOTE — Telephone Encounter (Signed)
Notified pt. 

## 2015-04-05 ENCOUNTER — Other Ambulatory Visit: Payer: Self-pay

## 2015-04-05 MED ORDER — ALPRAZOLAM 0.5 MG PO TABS: ORAL_TABLET | ORAL | Status: DC

## 2015-04-16 ENCOUNTER — Encounter: Payer: Self-pay | Admitting: Internal Medicine

## 2015-04-19 ENCOUNTER — Ambulatory Visit: Payer: Medicare Other | Admitting: Internal Medicine

## 2015-05-01 DIAGNOSIS — Z7901 Long term (current) use of anticoagulants: Secondary | ICD-10-CM | POA: Diagnosis not present

## 2015-05-02 LAB — PROTIME-INR

## 2015-05-03 ENCOUNTER — Other Ambulatory Visit: Payer: Self-pay | Admitting: *Deleted

## 2015-05-03 ENCOUNTER — Telehealth: Payer: Self-pay | Admitting: Internal Medicine

## 2015-05-03 MED ORDER — BISOPROLOL-HYDROCHLOROTHIAZIDE 5-6.25 MG PO TABS: 1.0000 | ORAL_TABLET | Freq: Every day | ORAL | Status: DC

## 2015-05-03 MED ORDER — DIPHENOXYLATE-ATROPINE 2.5-0.025 MG PO TABS: 1.0000 | ORAL_TABLET | Freq: Four times a day (QID) | ORAL | Status: DC | PRN

## 2015-05-03 NOTE — Telephone Encounter (Signed)
Per Dr Gilford Rile INR was in range, no change in medication, Notified pt to continue same dose.

## 2015-05-03 NOTE — Telephone Encounter (Signed)
Pt called stating that she would like to have her Prolia shot at her next app in Nov and she also needs an order change for COUMADIN) please advise pt.

## 2015-05-06 NOTE — Telephone Encounter (Signed)
I have electronically submitted pt's info for Prolia insurance verification and will notify you once I have a response. Thank you. °

## 2015-05-06 NOTE — Telephone Encounter (Signed)
Can we start the Prolia process for her.  Thanks her's is due at the beginning of Nov.

## 2015-05-13 NOTE — Telephone Encounter (Signed)
I have rec'd pt's insurance verification for Prolia.  Per the summary of benefits from Prolia, pt's estimated responsibility is $0. Please advise pt this is an estimate and we will not know an exact amt until her insurance(s) have paid. I have sent a copy of the summary of benefits to be scanned into pt's chart.  Please let me know if you have any questions. Thank you.

## 2015-05-13 NOTE — Telephone Encounter (Signed)
Give her a call on the November 3 to let her know that the shot is here in the office.

## 2015-05-13 NOTE — Telephone Encounter (Signed)
Ordered Prolia Injection today, she will get it on her November 4th appointment.

## 2015-05-14 NOTE — Telephone Encounter (Signed)
Prolia is in the fridge.

## 2015-05-17 ENCOUNTER — Other Ambulatory Visit: Payer: Self-pay

## 2015-05-17 DIAGNOSIS — Z1231 Encounter for screening mammogram for malignant neoplasm of breast: Secondary | ICD-10-CM

## 2015-05-31 ENCOUNTER — Ambulatory Visit: Payer: Medicare Other | Admitting: Internal Medicine

## 2015-06-03 DIAGNOSIS — Z7901 Long term (current) use of anticoagulants: Secondary | ICD-10-CM | POA: Diagnosis not present

## 2015-06-03 LAB — PROTIME-INR: INR: 2.7 — AB (ref 0.9–1.1)

## 2015-06-04 ENCOUNTER — Telehealth: Payer: Self-pay | Admitting: *Deleted

## 2015-06-04 NOTE — Telephone Encounter (Signed)
Prolia remains in fridge as appointment has been rescheduled twice.

## 2015-06-04 NOTE — Telephone Encounter (Signed)
Patient has called to verify that her prolia injection is here. Patient has an upcoming appt  11/10 Patient requested a call back to verify.

## 2015-06-04 NOTE — Telephone Encounter (Signed)
Left a detailed message that Prolia is at the office in the fridge for her appointment on the 10th.

## 2015-06-05 ENCOUNTER — Telehealth: Payer: Self-pay | Admitting: Internal Medicine

## 2015-06-05 NOTE — Telephone Encounter (Signed)
INR was 2.7. Continue current coumadin dosing. Recheck INR in 1 month.

## 2015-06-06 ENCOUNTER — Encounter: Payer: Self-pay | Admitting: Internal Medicine

## 2015-06-06 ENCOUNTER — Ambulatory Visit (INDEPENDENT_AMBULATORY_CARE_PROVIDER_SITE_OTHER): Payer: Medicare Other | Admitting: Internal Medicine

## 2015-06-06 VITALS — BP 159/79 | HR 74 | Temp 97.5°F | Ht 61.0 in | Wt 148.4 lb

## 2015-06-06 DIAGNOSIS — I10 Essential (primary) hypertension: Secondary | ICD-10-CM | POA: Diagnosis not present

## 2015-06-06 DIAGNOSIS — F329 Major depressive disorder, single episode, unspecified: Secondary | ICD-10-CM

## 2015-06-06 DIAGNOSIS — C50919 Malignant neoplasm of unspecified site of unspecified female breast: Secondary | ICD-10-CM

## 2015-06-06 DIAGNOSIS — Z7901 Long term (current) use of anticoagulants: Secondary | ICD-10-CM | POA: Diagnosis not present

## 2015-06-06 DIAGNOSIS — F32A Depression, unspecified: Secondary | ICD-10-CM

## 2015-06-06 DIAGNOSIS — M8580 Other specified disorders of bone density and structure, unspecified site: Secondary | ICD-10-CM

## 2015-06-06 MED ORDER — DENOSUMAB 60 MG/ML ~~LOC~~ SOLN
60.0000 mg | Freq: Once | SUBCUTANEOUS | Status: AC
  Administered 2015-06-06: 60 mg via SUBCUTANEOUS

## 2015-06-06 MED ORDER — ALPRAZOLAM 0.5 MG PO TABS: ORAL_TABLET | ORAL | Status: DC

## 2015-06-06 NOTE — Progress Notes (Signed)
Pre visit review using our clinic review tool, if applicable. No additional management support is needed unless otherwise documented below in the visit note. 

## 2015-06-06 NOTE — Progress Notes (Signed)
Subjective:    Patient ID: [name], female    DOB: 1926-03-31, 81 y.o.   MRN: [id number]  HPI  81YO female presents for follow up.  Feeling more depressed. Attributes to Presidential Election stress. Anxiety well controlled with alprazolam. Sleeping well.  Breast cancer - Continues on Femara. Scheduled for mammogram and follow up with Dr. Bary Castilla.  Chronic anticoagulation - Recent INR 2.7. Compliant with medication. No bleeding or bruising. Has debated if DTI would be better for her, but does not want to change because of cost.  Symptoms of diarrhea have resolved.   Wt Readings from Last 3 Encounters:  06/06/15 148 lb 6 oz (67.302 kg)  02/28/15 147 lb (66.679 kg)  01/15/15 148 lb 4 oz (67.246 kg)   BP Readings from Last 3 Encounters:  06/06/15 159/79  02/28/15 147/77  01/15/15 149/84    Past Medical History  Diagnosis Date  . S/P IVC filter 2009  . Pulmonary embolus ([place]) 2009  . Insomnia   . Allergy   . Hypertension   . Osteoporosis     Prolia 10/2012  . Cancer ([place])   . 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   Family History  Problem Relation Age of Onset  . COPD Mother   . Stroke Father   . Cancer Father     breast   Past Surgical History  Procedure Laterality Date  . Replacement total knee      right  . Cholecystectomy    . Hernia repair    . Bladder surgery    . Tonsillectomy    . Breast surgery Left 2013    mastectomy   Social History   Social History  . Marital Status: Single    Spouse Name: N/A  . Number of Children: N/A  . Years of Education: N/A   Social History Main Topics  . Smoking status: Former Smoker    Quit date: 05/13/1999  . Smokeless tobacco: Never Used  . Alcohol Use: No  . Drug Use: No  . Sexual Activity: Not Asked   Other Topics Concern  . None   Social History Narrative   Lives at [facility]. Has 2 sons. Widow.    Review of Systems  Constitutional: Negative for fever, chills, appetite change, fatigue and unexpected weight change.  Eyes: Negative for visual disturbance.  Respiratory: Negative for shortness of breath.   Cardiovascular: Negative for chest pain and leg swelling.  Gastrointestinal: Negative for nausea, vomiting, abdominal pain, diarrhea and constipation.  Musculoskeletal: Negative for myalgias and arthralgias.  Skin: Negative for color change and rash.  Hematological: Negative for adenopathy. Does not bruise/bleed easily.  Psychiatric/Behavioral: Positive for dysphoric mood. Negative for suicidal ideas and sleep disturbance. The patient is nervous/anxious.        Objective:    BP 159/79 mmHg  Pulse 74  Temp(Src) 97.5 F (36.4 C) (Oral)  Ht 5' 1" (1.549 m)  Wt 148 lb 6 oz (67.302 kg)  BMI 28.05 kg/m2  SpO2 95% Physical Exam  Constitutional: She is oriented to person, place, and time. She appears well-developed and well-nourished. No distress.  HENT:  Head: Normocephalic and atraumatic.  Right Ear: External ear normal.  Left Ear: External ear normal.  Nose: Nose normal.  Mouth/Throat: Oropharynx is clear and moist. No oropharyngeal exudate.  Eyes: Conjunctivae are normal. Pupils are equal, round,  and reactive to light. Right eye exhibits no discharge. Left eye exhibits no discharge. No scleral icterus.  Neck: Normal range of motion. Neck supple. No tracheal deviation present. No thyromegaly present.  Cardiovascular: Normal rate, regular rhythm, normal heart sounds and intact distal pulses.  Exam reveals no gallop and no friction rub.   No murmur heard. Pulmonary/Chest: Effort normal and breath sounds normal. No respiratory distress. She has no wheezes. She has no rales. She exhibits no tenderness.  Musculoskeletal: Normal range of motion. She exhibits no edema or tenderness.  Lymphadenopathy:    She has no cervical adenopathy.  Neurological:  She is alert and oriented to person, place, and time. No cranial nerve deficit. She exhibits normal muscle tone. Coordination normal.  Skin: Skin is warm and dry. No rash noted. She is not diaphoretic. No erythema. No pallor.  Psychiatric: She has a normal mood and affect. Her behavior is normal. Judgment and thought content normal.          Assessment & Plan:   Problem List Items Addressed This Visit      Unprioritized   Breast cancer (HCC)    Follow up mammogram and visit with Dr. Byrnett scheduled. Continue Femara.      Relevant Medications   ALPRAZolam (XANAX) 0.5 MG tablet   Chronic anticoagulation    Recent INR 2.7. Tolerating Coumadin well. Discussed pros and cons of DTI. Will continue Coumadin. Recheck INR in 4-6 weeks.      Depression    Symptoms are generally well controlled. Offered support today. Encouraged her to spent time with her son over Thanksgiving as scheduled. Continue prn Alprazolam for severe anxiety.      Relevant Medications   ALPRAZolam (XANAX) 0.5 MG tablet   Hypertension - Primary    BP Readings from Last 3 Encounters:  06/06/15 159/79  02/28/15 147/77  01/15/15 149/84   BP has generally been well controlled. Continue Bisoprolol-HCTZ.  Recent renal function was stable.          Return in about 3 months (around 09/06/2015) for Recheck.   

## 2015-06-06 NOTE — Assessment & Plan Note (Signed)
Recent INR 2.7. Tolerating Coumadin well. Discussed pros and cons of DTI. Will continue Coumadin. Recheck INR in 4-6 weeks.

## 2015-06-06 NOTE — Telephone Encounter (Signed)
Informed pt at visit.

## 2015-06-06 NOTE — Assessment & Plan Note (Signed)
Follow up mammogram and visit with Dr. Bary Castilla scheduled. Continue Femara.

## 2015-06-06 NOTE — Assessment & Plan Note (Signed)
BP Readings from Last 3 Encounters:  06/06/15 159/79  02/28/15 147/77  01/15/15 149/84   BP has generally been well controlled. Continue Bisoprolol-HCTZ.  Recent renal function was stable.

## 2015-06-06 NOTE — Patient Instructions (Addendum)
Continue current medications.  Follow up in 3 months or sooner as needed. 

## 2015-06-06 NOTE — Assessment & Plan Note (Signed)
Symptoms are generally well controlled. Offered support today. Encouraged her to spent time with her son over Thanksgiving as scheduled. Continue prn Alprazolam for severe anxiety.

## 2015-06-06 NOTE — Addendum Note (Signed)
Addended by: Vernetta Honey on: 06/06/2015 01:59 PM   Modules accepted: Orders

## 2015-06-25 ENCOUNTER — Telehealth: Payer: Self-pay | Admitting: *Deleted

## 2015-06-25 NOTE — Telephone Encounter (Signed)
Patient called to state she would like to have her follow up mammogram already scheduled for 07/04/15 at [facility] changed to a MRI. She states this is her personal preference and think it would be better to detect any concerns. She is aware to wait till you decide what to do about appointment.

## 2015-06-26 NOTE — Telephone Encounter (Signed)
The patient's most recent mammogram showed a fatty replaced breast. No indication for MRI imaging. The patient was informed of this. She will get her mammogram as scheduled on December 8 with office visit on December 14.

## 2015-07-03 DIAGNOSIS — Z7901 Long term (current) use of anticoagulants: Secondary | ICD-10-CM | POA: Diagnosis not present

## 2015-07-03 LAB — POCT INR: INR: 2.6 — AB (ref 0.9–1.1)

## 2015-07-04 ENCOUNTER — Ambulatory Visit
Admission: RE | Admit: 2015-07-04 | Discharge: 2015-07-04 | Disposition: A | Discharge status: Home / Self Care | Payer: Medicare Other | Source: Ambulatory Visit | Attending: General Surgery | Admitting: General Surgery

## 2015-07-04 ENCOUNTER — Other Ambulatory Visit: Payer: Self-pay | Admitting: General Surgery

## 2015-07-04 ENCOUNTER — Telehealth: Payer: Self-pay | Admitting: Internal Medicine

## 2015-07-04 DIAGNOSIS — Z1231 Encounter for screening mammogram for malignant neoplasm of breast: Secondary | ICD-10-CM

## 2015-07-04 NOTE — Telephone Encounter (Signed)
Coumadin level was at goal, 2.6. Continue coumadin same dose, and repeat INR in 4-6 weeks

## 2015-07-08 NOTE — Telephone Encounter (Signed)
Pt was notified by [facility] on 07/04/15

## 2015-07-10 ENCOUNTER — Ambulatory Visit (INDEPENDENT_AMBULATORY_CARE_PROVIDER_SITE_OTHER): Payer: Medicare Other | Admitting: General Surgery

## 2015-07-10 ENCOUNTER — Encounter: Payer: Self-pay | Admitting: General Surgery

## 2015-07-10 VITALS — BP 122/76 | HR 78 | Resp 12 | Ht 61.0 in | Wt 147.0 lb

## 2015-07-10 DIAGNOSIS — Z853 Personal history of malignant neoplasm of breast: Secondary | ICD-10-CM | POA: Diagnosis not present

## 2015-07-10 DIAGNOSIS — Z79899 Other long term (current) drug therapy: Secondary | ICD-10-CM

## 2015-07-10 DIAGNOSIS — R2231 Localized swelling, mass and lump, right upper limb: Secondary | ICD-10-CM | POA: Diagnosis not present

## 2015-07-10 DIAGNOSIS — M810 Age-related osteoporosis without current pathological fracture: Secondary | ICD-10-CM | POA: Diagnosis not present

## 2015-07-10 NOTE — Progress Notes (Signed)
Patient ID: [name], female   DOB: June 25, 1926, 81 y.o.   MRN: [id number]  Chief Complaint  Patient presents with  . Follow-up    mammogram    HPI [name] is a 81 y.o. female. who presents for a breast evaluation. The most recent right breast mammogram was done on 07/04/15.  Patient does perform regular self breast checks and gets regular mammograms done.    HPI  Past Medical History  Diagnosis Date  . S/P IVC filter 2009  . Pulmonary embolus ([place]) 2009  . Insomnia   . Allergy   . Hypertension   . Osteoporosis     Prolia 10/2012  . Cancer ([place])   . 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    Past Surgical History  Procedure Laterality Date  . Replacement total knee      right  . Cholecystectomy    . Hernia repair    . Bladder surgery    . Tonsillectomy    . Breast surgery Left 2013    mastectomy  . Mastectomy Left     06/2012    Family History  Problem Relation Age of Onset  . COPD Mother   . Stroke Father   . Cancer Father     breast  . Breast cancer Maternal Aunt     Social History Social History  Substance Use Topics  . Smoking status: Former Smoker    Quit date: 05/13/1999  . Smokeless tobacco: Never Used  . Alcohol Use: No    Allergies  Allergen Reactions  . Erythromycin   . Sulfa Drugs Cross Reactors     rash    Current Outpatient Prescriptions  Medication Sig Dispense Refill  . ALPRAZolam (XANAX) 0.5 MG tablet TAKE 1 TABLET EVERY NIGHT AT BEDTIME AS NEEDED FOR SLEEP OR ANXIETY 90 tablet 1  . Biotin 10 MG TABS Take by mouth.    . bisoprolol-hydrochlorothiazide (ZIAC) 5-6.25 MG tablet Take 1 tablet by mouth daily. 90 tablet 3  . Cholecalciferol (VITAMIN D PO) Take 2,000 mg by mouth daily.    Marland Kitchen letrozole (FEMARA) 2.5 MG tablet Take 1 tablet (2.5 mg total) by mouth daily. 90 tablet 3  . montelukast (SINGULAIR)  10 MG tablet Take 1 tablet (10 mg total) by mouth at bedtime. 90 tablet 4  . warfarin (COUMADIN) 1 MG tablet Please take as directed by physician 100 tablet 3  . warfarin (COUMADIN) 3 MG tablet TAKE 1 TABLET DAILY 90 tablet 3   No current facility-administered medications for this visit.    Review of Systems Review of Systems  Constitutional: Negative.   Respiratory: Negative.   Cardiovascular: Negative.     Blood pressure 122/76, pulse 78, resp. rate 12, height 5' 1" (1.549 m), weight 147 lb (66.679 kg).  Physical Exam Physical Exam  Constitutional: She is oriented to person, place, and time. She appears well-developed and well-nourished.  Eyes: Conjunctivae are normal. No scleral icterus.  Neck: Neck supple.  Cardiovascular: Normal rate, regular rhythm and normal heart sounds.   Pulmonary/Chest: Effort normal and breath sounds normal. Right breast exhibits no inverted nipple, no mass, no nipple discharge, no skin change and no tenderness.    Lymphadenopathy:    She has no cervical adenopathy.  Neurological: She is alert and oriented to person, place, and time.  Skin: Skin is warm and dry.  Data Reviewed Screening right breast mammogram dated 07/04/2015 was reviewed. No interval change. Fatty replaced breasts. BI-RADS-1.    Assessment    Doing well status post left mastectomy. Benign left breast exam.    Plan       Patient will be asked to return to the office in one year with a right screening mammogram.  Good tolerance of Femara.   Will arrange for a bone density test in the near future.   PCP:  [name] 07/12/2015, 7:16 AM

## 2015-07-10 NOTE — Patient Instructions (Addendum)
Patient will be asked to return to the office in one year with a right screening mammogram.  Will arrange for a bone density test in the near future. Patient will be contacted with day and time.

## 2015-07-25 ENCOUNTER — Ambulatory Visit
Admission: RE | Admit: 2015-07-25 | Discharge: 2015-07-25 | Disposition: A | Discharge status: Home / Self Care | Payer: Medicare Other | Source: Ambulatory Visit | Attending: General Surgery | Admitting: General Surgery

## 2015-07-25 DIAGNOSIS — Z853 Personal history of malignant neoplasm of breast: Secondary | ICD-10-CM

## 2015-07-25 DIAGNOSIS — Z79899 Other long term (current) drug therapy: Secondary | ICD-10-CM

## 2015-07-25 DIAGNOSIS — M810 Age-related osteoporosis without current pathological fracture: Secondary | ICD-10-CM

## 2015-08-05 DIAGNOSIS — Z7901 Long term (current) use of anticoagulants: Secondary | ICD-10-CM | POA: Diagnosis not present

## 2015-08-05 LAB — POCT INR: INR: 2.6 — AB (ref 0.9–1.1)

## 2015-08-05 LAB — PROTIME-INR: Protime: 26.2 seconds — AB (ref 10.0–13.8)

## 2015-08-09 ENCOUNTER — Telehealth: Payer: Self-pay | Admitting: Internal Medicine

## 2015-08-09 NOTE — Telephone Encounter (Signed)
I attempted to call the patient for clarification, the 19th is in the future, does she need labs done.  ??

## 2015-08-09 NOTE — Telephone Encounter (Signed)
Pt called to get her lab results from when she got done on 08/15/2015 at the [facility]. Call pt @ [phone]. Thank You!

## 2015-08-09 NOTE — Telephone Encounter (Signed)
I receached out to pt and she said it was 08/05/2015 labs was done.

## 2015-08-09 NOTE — Telephone Encounter (Signed)
Do you have any lab results for this patient that might have been faxed over? Nothing in the computer that I can find.  Thanks,

## 2015-08-12 ENCOUNTER — Other Ambulatory Visit: Payer: Self-pay | Admitting: Internal Medicine

## 2015-08-12 ENCOUNTER — Encounter: Payer: Self-pay | Admitting: *Deleted

## 2015-08-12 NOTE — Telephone Encounter (Signed)
Patient should not need a refill she was prescribed a 90 day supply with three refills.

## 2015-08-12 NOTE — Telephone Encounter (Signed)
Patient has requested a call, to clarify if she should continue her medication regimen. Please Advise Contact [phone]

## 2015-08-12 NOTE — Telephone Encounter (Signed)
Pt called about needing a refill for bisoprolol-hydrochlorothiazide (ZIAC) 5-6.25 MG tablet. Pharmacy is [facility], [place]. Call pt @ [phone]. Thank You!

## 2015-08-12 NOTE — Telephone Encounter (Signed)
I have called pt several times this morning, with no answer or a busy signal.  INR 2.6,  Dr Gilford Rile states no changes in medication.

## 2015-08-12 NOTE — Telephone Encounter (Signed)
Spoke with the patient, gave results.  Verbalized understanding.

## 2015-08-12 NOTE — Telephone Encounter (Signed)
Pt called back this morning regarding her lab results. I asked pt to have the nurse at [facility] refax pt lab results. [place] [phone]. Call pt @ [phone]. Pt apt number [phone]. Thank You!

## 2015-08-20 DIAGNOSIS — Z961 Presence of intraocular lens: Secondary | ICD-10-CM | POA: Diagnosis not present

## 2015-08-21 ENCOUNTER — Encounter: Payer: Self-pay | Admitting: Internal Medicine

## 2015-09-03 ENCOUNTER — Encounter: Payer: Self-pay | Admitting: *Deleted

## 2015-09-06 DIAGNOSIS — Z7901 Long term (current) use of anticoagulants: Secondary | ICD-10-CM | POA: Diagnosis not present

## 2015-09-06 LAB — PROTIME-INR: Protime: 19 seconds — AB (ref 10.0–13.8)

## 2015-09-06 LAB — POCT INR: INR: 19 — AB (ref 0.9–1.1)

## 2015-09-09 ENCOUNTER — Ambulatory Visit (INDEPENDENT_AMBULATORY_CARE_PROVIDER_SITE_OTHER): Payer: Medicare Other | Admitting: Internal Medicine

## 2015-09-09 ENCOUNTER — Encounter: Payer: Self-pay | Admitting: Internal Medicine

## 2015-09-09 VITALS — BP 133/76 | HR 77 | Temp 97.7°F | Ht 61.0 in | Wt 147.0 lb

## 2015-09-09 DIAGNOSIS — C50919 Malignant neoplasm of unspecified site of unspecified female breast: Secondary | ICD-10-CM | POA: Diagnosis not present

## 2015-09-09 DIAGNOSIS — I10 Essential (primary) hypertension: Secondary | ICD-10-CM | POA: Diagnosis not present

## 2015-09-09 DIAGNOSIS — Z7901 Long term (current) use of anticoagulants: Secondary | ICD-10-CM | POA: Diagnosis not present

## 2015-09-09 DIAGNOSIS — M8580 Other specified disorders of bone density and structure, unspecified site: Secondary | ICD-10-CM

## 2015-09-09 NOTE — Progress Notes (Signed)
Pre visit review using our clinic review tool, if applicable. No additional management support is needed unless otherwise documented below in the visit note. 

## 2015-09-09 NOTE — Assessment & Plan Note (Signed)
Reviewed mammogram from 06/2015 which was normal. Continue Letrozole.

## 2015-09-09 NOTE — Assessment & Plan Note (Signed)
Plan for repeat bone density testing in 09/2016. Reviewed previous bone density results with her.

## 2015-09-09 NOTE — Assessment & Plan Note (Signed)
BP Readings from Last 3 Encounters:  09/09/15 133/76  07/10/15 122/76  06/06/15 159/79   BP well controlled. Renal function with labs next month.

## 2015-09-09 NOTE — Assessment & Plan Note (Signed)
Recent INR 1.9. Continue coumadin same dose and recheck INR in 4 weeks.

## 2015-09-09 NOTE — Patient Instructions (Addendum)
Fasting labs next month.  Continue current medications.   Repeat bone density in 09/2016.  Follow up in 6 months.

## 2015-09-09 NOTE — Progress Notes (Signed)
Subjective:    Patient ID: [name], female    DOB: 1925-11-09, 82 y.o.   MRN: [id number]  HPI  82YO female presents for follow up.  Continues on Coumadin. INR Friday was 1.9. No bleeding or bruising noted.  Breast cancer - continues on Letrozole. No side effects noted. Recent mammogram normal.  Generally, feeling well. No health concerns today.  She is upset that bone density test was ordered when it was not due. She questions when this is due for her.  Wt Readings from Last 3 Encounters:  09/09/15 147 lb (66.679 kg)  07/10/15 147 lb (66.679 kg)  06/06/15 148 lb 6 oz (67.302 kg)   BP Readings from Last 3 Encounters:  09/09/15 133/76  07/10/15 122/76  06/06/15 159/79    Past Medical History  Diagnosis Date  . S/P IVC filter 2009  . Pulmonary embolus ([place]) 2009  . Insomnia   . Allergy   . Hypertension   . Osteoporosis     Prolia 10/2012  . Cancer ([place])   . Malignant neoplasm of upper-inner quadrant of female breast ([place]) July 22, 2012    T1c, N0 carcinoma left breast, ER 90%, PR 90%, HER-2/neu not over expressed.   Family History  Problem Relation Age of Onset  . COPD Mother   . Stroke Father   . Cancer Father     breast  . Breast cancer Maternal Aunt    Past Surgical History  Procedure Laterality Date  . Replacement total knee      right  . Cholecystectomy    . Hernia repair    . Bladder surgery    . Tonsillectomy    . Breast surgery Left 2013    mastectomy  . Mastectomy Left     06/2012   Social History   Social History  . Marital Status: Single    Spouse Name: N/A  . Number of Children: N/A  . Years of Education: N/A   Social History Main Topics  . Smoking status: Former Smoker    Quit date: 05/13/1999  . Smokeless tobacco: Never Used  . Alcohol Use: No  . Drug Use: No  . Sexual Activity: Not Asked   Other Topics Concern  . None   Social History Narrative   Lives at [facility]. Has 2 sons. Widow.    Review of Systems    Constitutional: Negative for fever, chills, appetite change, fatigue and unexpected weight change.  Eyes: Negative for visual disturbance.  Respiratory: Negative for shortness of breath.   Cardiovascular: Negative for chest pain and leg swelling.  Gastrointestinal: Negative for nausea, vomiting, abdominal pain, diarrhea and constipation.  Musculoskeletal: Negative for myalgias and arthralgias.  Skin: Negative for color change and rash.  Hematological: Negative for adenopathy. Does not bruise/bleed easily.  Psychiatric/Behavioral: Negative for sleep disturbance and dysphoric mood. The patient is not nervous/anxious.        Objective:    BP 133/76 mmHg  Pulse 77  Temp(Src) 97.7 F (36.5 C) (Oral)  Ht 5' 1"  (1.549 m)  Wt 147 lb (66.679 kg)  BMI 27.79 kg/m2  SpO2 94% Physical Exam  Constitutional: She is oriented to person, place, and time. She appears well-developed and well-nourished. No distress.  HENT:  Head: Normocephalic and atraumatic.  Right Ear: External ear normal.  Left Ear: External ear normal.  Nose: Nose normal.  Mouth/Throat: Oropharynx is clear and moist. No oropharyngeal exudate.  Eyes: Conjunctivae are normal. Pupils are equal, round, and reactive to  light. Right eye exhibits no discharge. Left eye exhibits no discharge. No scleral icterus.  Neck: Normal range of motion. Neck supple. No tracheal deviation present. No thyromegaly present.  Cardiovascular: Normal rate, regular rhythm, normal heart sounds and intact distal pulses.  Exam reveals no gallop and no friction rub.   No murmur heard. Pulmonary/Chest: Effort normal and breath sounds normal. No respiratory distress. She has no wheezes. She has no rales. She exhibits no tenderness.  Musculoskeletal: Normal range of motion. She exhibits no edema or tenderness.  Lymphadenopathy:    She has no cervical adenopathy.  Neurological: She is alert and oriented to person, place, and time. No cranial nerve deficit. She  exhibits normal muscle tone. Coordination normal.  Skin: Skin is warm and dry. No rash noted. She is not diaphoretic. No erythema. No pallor.  Psychiatric: She has a normal mood and affect. Her behavior is normal. Judgment and thought content normal.          Assessment & Plan:   Problem List Items Addressed This Visit      Unprioritized   Breast cancer ([place])    Reviewed mammogram from 06/2015 which was normal. Continue Letrozole.      Chronic anticoagulation    Recent INR 1.9. Continue coumadin same dose and recheck INR in 4 weeks.      Hypertension - Primary    BP Readings from Last 3 Encounters:  09/09/15 133/76  07/10/15 122/76  06/06/15 159/79   BP well controlled. Renal function with labs next month.      Osteopenia    Plan for repeat bone density testing in 09/2016. Reviewed previous bone density results with her.          Return in about 6 months (around 03/08/2016) for Physical.

## 2015-09-30 ENCOUNTER — Encounter: Payer: Self-pay | Admitting: *Deleted

## 2015-10-02 ENCOUNTER — Other Ambulatory Visit: Payer: Self-pay | Admitting: Internal Medicine

## 2015-10-02 DIAGNOSIS — I10 Essential (primary) hypertension: Secondary | ICD-10-CM | POA: Diagnosis not present

## 2015-10-02 DIAGNOSIS — D68318 Other hemorrhagic disorder due to intrinsic circulating anticoagulants, antibodies, or inhibitors: Secondary | ICD-10-CM | POA: Diagnosis not present

## 2015-10-02 DIAGNOSIS — E785 Hyperlipidemia, unspecified: Secondary | ICD-10-CM | POA: Diagnosis not present

## 2015-10-03 LAB — COMPREHENSIVE METABOLIC PANEL
ALT: 7 IU/L (ref 0–32)
AST: 17 IU/L (ref 0–40)
Albumin/Globulin Ratio: 1.3 (ref 1.1–2.5)
Albumin: 3.9 g/dL (ref 3.5–4.7)
Alkaline Phosphatase: 46 IU/L (ref 39–117)
BUN/Creatinine Ratio: 15 (ref 11–26)
BUN: 14 mg/dL (ref 8–27)
Bilirubin Total: 0.6 mg/dL (ref 0.0–1.2)
CO2: 24 mmol/L (ref 18–29)
Calcium: 9.4 mg/dL (ref 8.7–10.3)
Chloride: 98 mmol/L (ref 96–106)
Creatinine, Ser: 0.95 mg/dL (ref 0.57–1.00)
GFR calc Af Amer: 62 mL/min/{1.73_m2} (ref >59)
GFR calc non Af Amer: 54 mL/min/{1.73_m2} — ABNORMAL LOW (ref >59)
Globulin, Total: 2.9 g/dL (ref 1.5–4.5)
Glucose: 69 mg/dL (ref 65–99)
Potassium: 4.9 mmol/L (ref 3.5–5.2)
Sodium: 141 mmol/L (ref 134–144)
Total Protein: 6.8 g/dL (ref 6.0–8.5)

## 2015-10-03 LAB — LIPID PANEL W/O CHOL/HDL RATIO
Cholesterol, Total: 191 mg/dL (ref 100–199)
HDL: 52 mg/dL (ref >39)
LDL Calculated: 112 mg/dL — ABNORMAL HIGH (ref 0–99)
Triglycerides: 134 mg/dL (ref 0–149)
VLDL Cholesterol Cal: 27 mg/dL (ref 5–40)

## 2015-10-03 LAB — PROTIME-INR
INR: 2.1 — ABNORMAL HIGH (ref 0.8–1.2)
Prothrombin Time: 21.3 s — ABNORMAL HIGH (ref 9.1–12.0)

## 2015-10-03 LAB — CBC WITH DIFFERENTIAL/PLATELET
Basophils Absolute: 0 10*3/uL (ref 0.0–0.2)
Basos: 1 %
EOS (ABSOLUTE): 0.2 10*3/uL (ref 0.0–0.4)
Eos: 3 %
Hematocrit: 42.9 % (ref 34.0–46.6)
Hemoglobin: 14 g/dL (ref 11.1–15.9)
Immature Grans (Abs): 0 10*3/uL (ref 0.0–0.1)
Immature Granulocytes: 0 %
Lymphocytes Absolute: 1.5 10*3/uL (ref 0.7–3.1)
Lymphs: 25 %
MCH: 28.7 pg (ref 26.6–33.0)
MCHC: 32.6 g/dL (ref 31.5–35.7)
MCV: 88 fL (ref 79–97)
Monocytes Absolute: 0.6 10*3/uL (ref 0.1–0.9)
Monocytes: 10 %
Neutrophils Absolute: 3.7 10*3/uL (ref 1.4–7.0)
Neutrophils: 61 %
Platelets: 281 10*3/uL (ref 150–379)
RBC: 4.87 x10E6/uL (ref 3.77–5.28)
RDW: 13.9 % (ref 12.3–15.4)
WBC: 6.1 10*3/uL (ref 3.4–10.8)

## 2015-10-03 LAB — SPECIMEN STATUS REPORT

## 2015-10-04 LAB — MICROALBUMIN / CREATININE URINE RATIO
Creatinine, Urine: 40.1 mg/dL
MICROALB/CREAT RATIO: 8.2 mg/g creat (ref 0.0–30.0)
Microalbumin, Urine: 3.3 ug/mL

## 2015-10-04 LAB — SPECIMEN STATUS REPORT

## 2015-10-09 ENCOUNTER — Telehealth: Payer: Self-pay | Admitting: Internal Medicine

## 2015-10-09 NOTE — Telephone Encounter (Signed)
Faxed to [facility]

## 2015-10-09 NOTE — Telephone Encounter (Signed)
[name] [id number] called from the [facility] [place] pt had lab work done at [facility] on 10/02/2015 and it was sent here. She would like to get the results. Please call [name]. Thank you!

## 2015-10-14 ENCOUNTER — Telehealth: Payer: Self-pay | Admitting: Internal Medicine

## 2015-10-14 NOTE — Telephone Encounter (Signed)
Pt called to check if her lab results came in. Pt also wants to know if she stays on the same regimen. Pt got labs done on 10/02/15. Call pt @ [phone]. Thank you!

## 2015-10-15 NOTE — Telephone Encounter (Signed)
Pt is requesting test results of PT INR. Pt is currently on a regimen of 4mg  of warfin 3days / 3mg  of warfin 4days. Please advise, thanks

## 2015-10-15 NOTE — Telephone Encounter (Signed)
Notified pt of results, pt verbalized understanding and appreciation.

## 2015-10-15 NOTE — Telephone Encounter (Signed)
Does she mean labs from 3/8? These labs showed INR 2.1 which is at goal. Based on this, she would continue same dose coumadin. Repeat labs with INR in 4 weeks.

## 2015-11-05 DIAGNOSIS — Z7901 Long term (current) use of anticoagulants: Secondary | ICD-10-CM | POA: Diagnosis not present

## 2015-11-05 LAB — POCT INR: INR: 2.4 — AB (ref <=1.1)

## 2015-11-05 LAB — PROTIME-INR: Protime: 24 seconds — AB (ref 10.0–13.8)

## 2015-11-06 ENCOUNTER — Telehealth: Payer: Self-pay | Admitting: Internal Medicine

## 2015-11-06 NOTE — Telephone Encounter (Signed)
Pt called about being due on 12/04/2015 for her Prolia she received a phone call. Call pt @ [phone]. Thank you!

## 2015-11-06 NOTE — Telephone Encounter (Signed)
Does she need any re certification from Prolia for her next injections?

## 2015-11-13 NOTE — Telephone Encounter (Signed)
Yes ma'am we need to re-verify her insurance.  I have electronically submitted pt's info for [name] verification and will notify you once I have a response. Thank you.

## 2015-11-13 NOTE — Telephone Encounter (Signed)
Please let me know when prolia shot is ordered. Pt request to have AWV and prolia done on the same day due to transportation.msn

## 2015-11-18 ENCOUNTER — Encounter: Payer: Self-pay | Admitting: Internal Medicine

## 2015-11-26 NOTE — Telephone Encounter (Signed)
Is there more to this message? Thanks

## 2015-11-27 NOTE — Telephone Encounter (Signed)
Patient called back. I was checking her insurance information and from what i can see on the updated insurance card she does not have Part D. I gave her the number to call to see if she can get help from the company that makes prolia. She stated she was going to call and see what they would do for her because she didn't know it she could pay the 245$.

## 2015-11-27 NOTE — Telephone Encounter (Signed)
Patient called the number for prolia and she states that they basically told her they couldn't do anything for her. Patient does not understand why her insurance did not make her aware of the changes to her insurance. aptinet wants to know if this is necessary to have.

## 2015-11-27 NOTE — Telephone Encounter (Signed)
I have left a message for her to call me back.

## 2015-11-27 NOTE — Telephone Encounter (Signed)
I have rec'd Ms. Locastro's ins verification for Prolia.  I was advised MCR Part B no longer covers dx M85.80.  Their coverage benefits changed effective the first of the year.  Could someone please check w/Ms. Smelcer and see if she has Part D coverage.  If so, they may cover it.  I have sent a copy of the summary of benefits to be scanned into her chart.  If she does not have Part D or it will not cover then her Tric[employer] for Life will cover and she will have an estimated responsibility of $245.00.  If she can not afford $245 for the injection, please advise her to call Prolia at [phone] and select option #1 to see if she qualifies for one of their assistance programs.    Please send me her actual injection date once she receives it.  If you have any further questions, please feel free to contact me.  Thank you!

## 2015-11-28 NOTE — Telephone Encounter (Signed)
We can discuss in a visit.

## 2015-11-29 ENCOUNTER — Telehealth: Payer: Self-pay | Admitting: Internal Medicine

## 2015-11-29 NOTE — Telephone Encounter (Signed)
patient called insurance and they stated to re-send PA and see if the ICD-10 code could be changed to see if we could get it approved.

## 2015-11-29 NOTE — Telephone Encounter (Signed)
The patient left a message asking for a return call from [facility]

## 2015-11-29 NOTE — Telephone Encounter (Signed)
Rose, Please advise if there is a way to change the ICD 10 code that was used for the PA for Prolia? Patient didn't qualify for the assistance program and the cost is too high so I believe she spoke with the insurance to see what can be done.  thanks

## 2015-12-03 ENCOUNTER — Other Ambulatory Visit: Payer: Self-pay

## 2015-12-03 DIAGNOSIS — Z7901 Long term (current) use of anticoagulants: Secondary | ICD-10-CM | POA: Diagnosis not present

## 2015-12-03 LAB — PROTIME-INR: Protime: 33.9 seconds — AB (ref 10.0–13.8)

## 2015-12-03 LAB — POCT INR: INR: 3.3 — AB (ref <=1.1)

## 2015-12-03 MED ORDER — BISOPROLOL-HYDROCHLOROTHIAZIDE 5-6.25 MG PO TABS: 1.0000 | ORAL_TABLET | Freq: Every day | ORAL | Status: DC

## 2015-12-03 MED ORDER — LETROZOLE 2.5 MG PO TABS: 2.5000 mg | ORAL_TABLET | Freq: Every day | ORAL | Status: DC

## 2015-12-03 NOTE — Telephone Encounter (Signed)
Hi Tanya, unfortunately I cannot change the code.  MCR has stopped paying for Osteopenia effective the first of this year. If the code were Osteoporosis they would more than likely cover, however, her Dexa Scan does not show osteoporosis.  I have researched online to see if I could find any help for her but have been unsuccessful.  She's not eligible for any of the assistance programs due to the osteopenia dx.  I have spoken w/her and she understands she is not eligible and can't afford the $245.  She is very pleasant.  She wants to know if there is something else she can take, maybe in pill form instead of Prolia.  I advised she may want to make an apptmt w/Dr. Gilford Rile to discuss her options.    While talking w/her she let me know she is almost out of her Ziac and Letrocel and would like them called in to her mail order pharmacy.  She also said to remind you that every 3 months a new Rx has to be written and sent in to the mail order pharmacy.  I also just thought she can call [facility] to see if she qualifies for their discount program.  She will need to have a balance first.  Their # is [phone].  It is worth a try.  I am so sorry I couldn't be of more assistance for her, but I did exhaust all avenues.  Thank you!

## 2015-12-03 NOTE — Telephone Encounter (Signed)
Spoke with the patient, she wants to think about if she wants to pursue another medication at this time.  Thanks

## 2015-12-03 NOTE — Telephone Encounter (Signed)
I refilled her medications that she requested.  FYI on the Prolia outcome.  Any suggestions?

## 2015-12-03 NOTE — Telephone Encounter (Signed)
We should discuss in a follow up visit. Or, we can make a referral to endocrinology to discuss some other options.

## 2015-12-18 ENCOUNTER — Telehealth: Payer: Self-pay | Admitting: Internal Medicine

## 2015-12-18 DIAGNOSIS — Z7901 Long term (current) use of anticoagulants: Secondary | ICD-10-CM | POA: Diagnosis not present

## 2015-12-18 LAB — PROTIME-INR: Protime: 23.5 seconds — AB (ref 10.0–13.8)

## 2015-12-18 NOTE — Telephone Encounter (Signed)
Pt called to follow up on her lab results from 12/03/15.   Call pt @ [phone] Message can be left it is secured. Thank you!

## 2015-12-18 NOTE — Telephone Encounter (Signed)
Spoke to patient . Gave labs results. Advised to repeat labs at [facility] per Dr. Gilford Rile. Patient agreed. Will have labs drawn and then contact our office with results.

## 2015-12-19 LAB — POCT INR: INR: 2.3 — AB (ref 0.9–1.1)

## 2015-12-24 ENCOUNTER — Encounter: Payer: Self-pay | Admitting: Internal Medicine

## 2015-12-24 ENCOUNTER — Telehealth: Payer: Self-pay | Admitting: Internal Medicine

## 2015-12-24 NOTE — Telephone Encounter (Signed)
Patient aware of results and recommendations. °

## 2015-12-24 NOTE — Telephone Encounter (Signed)
Coumadin level was at goal at 2.3. Please have pt continue coumadin same dose and repeat coumadin level in 4 weeks.

## 2016-01-07 ENCOUNTER — Telehealth: Payer: Self-pay | Admitting: Internal Medicine

## 2016-01-07 MED ORDER — ALPRAZOLAM 0.5 MG PO TABS: ORAL_TABLET | ORAL | Status: DC

## 2016-01-07 NOTE — Telephone Encounter (Signed)
Refill completed.

## 2016-01-07 NOTE — Telephone Encounter (Signed)
Pt lvm stating that she only has 10 tabs left of her xanax. She needs refill of this sent to her home delivery company. She said that it has to come from [place] and she does not want to go to the drug store to have to pick this up. She would like a cb as soon as it has been sent.

## 2016-01-07 NOTE — Addendum Note (Signed)
Addended by: Durwin Glaze on: 01/07/2016 11:58 AM   Modules accepted: Orders

## 2016-01-13 ENCOUNTER — Telehealth: Payer: Self-pay | Admitting: *Deleted

## 2016-01-13 DIAGNOSIS — Z7901 Long term (current) use of anticoagulants: Secondary | ICD-10-CM | POA: Diagnosis not present

## 2016-01-13 NOTE — Telephone Encounter (Signed)
Void note, patient was scheduled

## 2016-01-14 LAB — POCT INR: INR: 2.1 — AB (ref 0.9–1.1)

## 2016-01-15 ENCOUNTER — Ambulatory Visit (INDEPENDENT_AMBULATORY_CARE_PROVIDER_SITE_OTHER): Payer: Medicare Other | Admitting: Family Medicine

## 2016-01-15 VITALS — BP 179/99 | HR 77 | Temp 97.7°F | Ht 59.0 in | Wt 144.5 lb

## 2016-01-15 DIAGNOSIS — I10 Essential (primary) hypertension: Secondary | ICD-10-CM

## 2016-01-15 DIAGNOSIS — R251 Tremor, unspecified: Secondary | ICD-10-CM | POA: Diagnosis not present

## 2016-01-15 NOTE — Patient Instructions (Addendum)
We will call with the referral.  Have [facility] send over your blood pressures over the next week.  Take care  Dr. Lacinda Axon

## 2016-01-16 DIAGNOSIS — R251 Tremor, unspecified: Secondary | ICD-10-CM | POA: Insufficient documentation

## 2016-01-16 NOTE — Assessment & Plan Note (Signed)
BP elevated on 2 occasions today. Patient states that she often does this (? White coat component). Will continue Ziac. Patient to have her blood pressures check and [facility] and have them sent to PCP.

## 2016-01-16 NOTE — Assessment & Plan Note (Signed)
New problem. Likely benign. Patient very concerned; I offered neurology referral and she would like to proceed with this. Referral placed.

## 2016-01-16 NOTE — Progress Notes (Signed)
   Subjective:  Patient ID: [name], female    DOB: 1926-07-25  Age: 82 y.o. MRN: [id number]  CC: Hands shaking  HPI:  82 year old female with HTN, history of breast cancer, and chronic anticoagulation presents with the above complaint.  Patient states that for the past few weeks she noticed that her hands shake particularly when holding things. She's also noticed some shaking at rest. She is concerned that this may be from one of more of her medications. She is also concerned that she has underlying disease process is causing the shaking. She states that it's severe and quite bothersome to her. No known relieving factors. He does seem to be worse with activity/holding things.  Social Hx   Social History   Social History  . Marital Status: Single    Spouse Name: N/A  . Number of Children: N/A  . Years of Education: N/A   Social History Main Topics  . Smoking status: Former Smoker    Quit date: 05/13/1999  . Smokeless tobacco: Never Used  . Alcohol Use: No  . Drug Use: No  . Sexual Activity: Not on file   Other Topics Concern  . Not on file   Social History Narrative   Lives at [employer]. Has 2 sons. Widow.   Review of Systems  Constitutional: Negative.   Neurological: Positive for tremors. Negative for weakness.   Objective:  BP 179/99 mmHg  Pulse 77  Temp(Src) 97.7 F (36.5 C)  Ht 4\' 11"  (1.499 m)  Wt 144 lb 8 oz (65.545 kg)  BMI 29.17 kg/m2  SpO2 94%  BP/Weight 01/15/2016 09/09/2015 [id number]  Systolic BP 0000000 [id number]  Diastolic BP 99 76 76  Wt. (Lbs) 144.5 147 147  BMI 29.17 27.79 27.79   Physical Exam  Constitutional: She is oriented to person, place, and time. She appears well-developed. No distress.  Pulmonary/Chest: Effort normal.  Neurological: She is alert and oriented to person, place, and time.  No focal deficits. Tremor noted with activity today (holding paper).  Psychiatric:  Anxious.  Vitals reviewed.  Lab Results  Component Value  Date   WBC 6.1 10/02/2015   HGB 13.7 08/31/2014   HCT 42.9 10/02/2015   PLT 281 10/02/2015   GLUCOSE 69 10/02/2015   CHOL 191 10/02/2015   TRIG 134 10/02/2015   HDL 52 10/02/2015   LDLCALC 112* 10/02/2015   ALT 7 10/02/2015   AST 17 10/02/2015   NA 141 10/02/2015   K 4.9 10/02/2015   CL 98 10/02/2015   CREATININE 0.95 10/02/2015   BUN 14 10/02/2015   CO2 24 10/02/2015   TSH 2.20 08/31/2014   INR 2.3* 12/19/2015   MICROALBUR 1.4 08/31/2014   Assessment & Plan:   Problem List Items Addressed This Visit    Hypertension    BP elevated on 2 occasions today. Patient states that she often does this (? White coat component). Will continue Ziac. Patient to have her blood pressures check and [facility] and have them sent to PCP.      Tremor - Primary    New problem. Likely benign. Patient very concerned; I offered neurology referral and she would like to proceed with this. Referral placed.       Relevant Orders   Ambulatory referral to Neurology     Follow-up: PRN  [facility]

## 2016-02-11 DIAGNOSIS — Z7901 Long term (current) use of anticoagulants: Secondary | ICD-10-CM | POA: Diagnosis not present

## 2016-02-11 LAB — PROTIME-INR: Protime: 24 seconds — AB (ref 10.0–13.8)

## 2016-02-11 LAB — POCT INR: INR: 2.4 — AB (ref 0.9–1.1)

## 2016-02-12 ENCOUNTER — Telehealth: Payer: Self-pay | Admitting: Internal Medicine

## 2016-02-12 NOTE — Telephone Encounter (Signed)
Patient has been notified

## 2016-02-12 NOTE — Telephone Encounter (Signed)
INR was 2.4. Please have her continue same dose of coumadin. Repeat INR in 4 weeks.

## 2016-03-04 ENCOUNTER — Telehealth: Payer: Self-pay | Admitting: *Deleted

## 2016-03-04 NOTE — Telephone Encounter (Signed)
Patient called and wanted to get a new prescription for a mastectomy bra. She would like the prescription mailed to her address:  [address]  [place] [address]   Thanks

## 2016-03-04 NOTE — Telephone Encounter (Signed)
Completed as requested. Appointment recalls is December 2017.

## 2016-03-10 ENCOUNTER — Encounter: Payer: Medicare Other | Admitting: Internal Medicine

## 2016-03-11 ENCOUNTER — Encounter: Payer: Self-pay | Admitting: Family Medicine

## 2016-03-11 ENCOUNTER — Ambulatory Visit (INDEPENDENT_AMBULATORY_CARE_PROVIDER_SITE_OTHER): Payer: Medicare Other | Admitting: Family Medicine

## 2016-03-11 DIAGNOSIS — Z0000 Encounter for general adult medical examination without abnormal findings: Secondary | ICD-10-CM | POA: Diagnosis not present

## 2016-03-11 DIAGNOSIS — W19XXXA Unspecified fall, initial encounter: Secondary | ICD-10-CM

## 2016-03-11 DIAGNOSIS — R251 Tremor, unspecified: Secondary | ICD-10-CM | POA: Diagnosis not present

## 2016-03-11 NOTE — Assessment & Plan Note (Signed)
Patient continues to express concern regarding this. Minimal tremor on exam intermittently. Otherwise neurologically intact. Discussed keeping her appointment with the neurologist. Tried to reassure her that it is likely a benign tremor.

## 2016-03-11 NOTE — Progress Notes (Signed)
Pre visit review using our clinic review tool, if applicable. No additional management support is needed unless otherwise documented below in the visit note. 

## 2016-03-11 NOTE — Assessment & Plan Note (Addendum)
Annual Medicare wellness  exam was done as well as a discussion of the patient's tremor.  During the course of the visit the patient was educated and counseled about appropriate screening and preventive services including : fall prevention, diabetes screening, nutrition counseling, and recommended immunizations.  No need for colon cancer screening given age and negative history. No need for pelvic exam or pap smear given age. Discussed falls prevention. Discussed tremor. Patient has appointment with neurology already.

## 2016-03-11 NOTE — Assessment & Plan Note (Signed)
Single fall earlier this year. Appears to be mechanical as she tripped on a corner of a table with no preceding symptoms. No persistent injuries. Neurologically intact today. Has not had any falls since then. Discussed fall prevention and offered physical therapy though she was unsure at this time. Could consider in the future if continues to be an issue.

## 2016-03-11 NOTE — Patient Instructions (Addendum)
  [name] , Thank you for taking time to come for your Medicare Wellness Visit. I appreciate your ongoing commitment to your health goals. Please review the following plan we discussed and let me know if I can assist you in the future.   This is a list of the screening recommended for you and due dates:  Health Maintenance  Topic Date Due  . Flu Shot  02/25/2016  . Tetanus Vaccine  03/12/2021  . DEXA scan (bone density measurement)  Completed  . Shingles Vaccine  Completed  . Pneumonia vaccines  Completed   We discussed falls prevention with minimizing obstacles on the floor such as loose rugs, making the walking paths in her apartment wide enough to get through easily, and potentially doing physical therapy if you would like.  Additionally we discussed trying to get increased exercise and optimizing her diet.

## 2016-03-11 NOTE — Progress Notes (Signed)
[name], MD Phone: [phone]  [name] is a 82 y.o. female who presents today for eye care annual wellness visit  The patient is here for annual Medicare wellness examination and management of other chronic and acute problems.   The risk factors are reflected in the social history.  The roster of all physicians providing medical care to patient - is listed in the Snapshot section of the chart.  Activities of daily living:  The patient is 100% independent in all ADLs: dressing, toileting, feeding as well as independent mobility  Home safety : The patient has smoke detectors in the home. They wear seatbelts.  There are no firearms at home. There is no violence in the home.   There is no risks for hepatitis, STDs or HIV. There is no history of blood transfusion.   The patient has seen their dentist in the last six month. They have seen their eye doctor in the last year. They admit to slight hearing difficulty and wears hearing aids. Hears well with these. They do not  have excessive sun exposure. Discussed the need for sun protection: hats, long sleeves and use of sunscreen if there is significant sun exposure.   Diet: the importance of a healthy diet is discussed. Eats what ever out of frozen food section. Does not like to cook for one person. Gets a meat and vegetable. 2-3 meals per day.   The benefits of regular aerobic exercise were discussed. She tries to walk the halls at her living facility.   Depression screen: at times feels misses her other half though no depression. Family lives close. there are no signs or vegative symptoms of depression- irritability, change in appetite, anhedonia, sadness/tearfullness.  Cognitive assessment: her son helps with her finances with the patient. They could relate day,date,year and events; recalled 2/3 objects at 3 minutes; performed clock-face test normally.  The following portions of the patient's history were reviewed and updated  as appropriate: allergies, current medications, past family history, past medical history,  past surgical history, past social history  and problem list.  Visual acuity was not assessed per patient preference since she has regular follow up with her ophthalmologist. Hearing and body mass index were assessed and reviewed.   During the course of the visit the patient was educated and counseled about appropriate screening and preventive services including : fall prevention , diabetes screening, nutrition counseling, colorectal cancer, and recommended immunizations.    [place] once in March of this year. Tripped on corner of table. Hit knees and right head. No headaches, numbness, weakness, or vision changes. No other falls. No injury. No persistent issues related to this. No preceding symptoms.  Concerned about tremor. Mostly occurs when she is on the paper in the morning. Has neurology appointment. Evaluated by Dr Lacinda Axon previously.   Active Ambulatory Problems    Diagnosis Date Noted  . Insomnia 05/13/2011  . Hypertension 05/13/2011  . Chronic anticoagulation 12/03/2011  . Depression 01/08/2012  . Breast cancer ([place]) 06/02/2012  . Osteopenia 09/22/2012  . Seborrheic keratoses 12/28/2012  . Medicare annual wellness visit, subsequent 05/19/2013  . Left shoulder pain 03/01/2014  . Lump in armpit 03/01/2014  . Axillary mass 03/14/2014  . Osteoarthritis of left knee 08/31/2014  . Gait instability 08/31/2014  . Memory loss 08/31/2014  . Cold intolerance 11/26/2014  . Seasonal allergic rhinitis 02/28/2015  . Tremor 01/16/2016   Resolved Ambulatory Problems    Diagnosis Date Noted  . Anticoagulation monitoring, INR range 2-3 05/13/2011  .  Diarrhea 12/03/2011  . Skin lesion 12/03/2011  . Urinary tract infection 12/03/2011  . Chronic diarrhea 01/08/2012  . Left otitis media 07/07/2012  . Personal history of malignant neoplasm of breast 04/27/2013  . Medication management 03/01/2014  . Left  ear pain 03/01/2014  . Diarrhea 01/15/2015   Past Medical History:  Diagnosis Date  . Allergy   . Cancer ([place])   . Hypertension   . Insomnia   . Malignant neoplasm of upper-inner quadrant of female breast [facility]) July 22, 2012  . Osteoporosis   . Pulmonary embolus ([place]) 2009  . S/P IVC filter 2009    Family History  Problem Relation Age of Onset  . COPD Mother   . Stroke Father   . Cancer Father     breast  . Breast cancer Maternal Aunt     Social History   Social History  . Marital status: Single    Spouse name: N/A  . Number of children: N/A  . Years of education: N/A   Occupational History  . Not on file.   Social History Main Topics  . Smoking status: Former Smoker    Quit date: 05/13/1999  . Smokeless tobacco: Never Used  . Alcohol use No  . Drug use: No  . Sexual activity: Not on file   Other Topics Concern  . Not on file   Social History Narrative   Lives at [employer]. Has 2 sons. Widow.    ROS no ROS completed given Medicare wellness visit  Objective  Physical Exam Vitals:   03/11/16 1409  BP: 136/78  Pulse: 81  Temp: 98.3 F (36.8 C)    BP Readings from Last 3 Encounters:  03/11/16 136/78  01/15/16 (!) 179/99  09/09/15 133/76   Wt Readings from Last 3 Encounters:  03/11/16 141 lb 9.6 oz (64.2 kg)  01/15/16 144 lb 8 oz (65.5 kg)  09/09/15 147 lb (66.7 kg)    Physical Exam  Constitutional: She is well-developed, well-nourished, and in no distress.  Neurological: She is alert.  CN 2-12 intact, 5/5 strength in bilateral biceps, triceps, grip, quads, hamstrings, plantar and dorsiflexion, sensation to light touch intact in bilateral UE and LE, normal gait, 2+ patellar reflexes, occasional benign tremor     Assessment/Plan:   Medicare annual wellness visit, subsequent Annual Medicare wellness  exam was done as well as a discussion of the patient's tremor.  During the course of the visit the patient was educated and counseled  about appropriate screening and preventive services including : fall prevention, diabetes screening, nutrition counseling, and recommended immunizations.  No need for colon cancer screening given age and negative history. No need for pelvic exam or pap smear given age. Discussed falls prevention. Discussed tremor. Patient has appointment with neurology already.   Tremor Patient continues to express concern regarding this. Minimal tremor on exam intermittently. Otherwise neurologically intact. Discussed keeping her appointment with the neurologist. Tried to reassure her that it is likely a benign tremor.   No orders of the defined types were placed in this encounter.   No orders of the defined types were placed in this encounter.    [name], MD [place]

## 2016-03-12 DIAGNOSIS — Z7901 Long term (current) use of anticoagulants: Secondary | ICD-10-CM | POA: Diagnosis not present

## 2016-03-12 LAB — PROTIME-INR: Protime: 22.2 seconds — AB (ref 10.0–13.8)

## 2016-03-12 LAB — POCT INR: INR: 2.2 — AB (ref 0.9–1.1)

## 2016-03-17 ENCOUNTER — Encounter: Payer: Self-pay | Admitting: Surgical

## 2016-03-17 ENCOUNTER — Telehealth: Payer: Self-pay | Admitting: Surgical

## 2016-03-17 NOTE — Telephone Encounter (Signed)
Spoke with patient with results of INR of 2.2. Dr. Lacinda Axon signed results to be scanned with no changes.

## 2016-04-03 ENCOUNTER — Other Ambulatory Visit: Payer: Self-pay | Admitting: Surgical

## 2016-04-03 MED ORDER — WARFARIN SODIUM 3 MG PO TABS: ORAL_TABLET | ORAL | 1 refills | Status: DC

## 2016-04-08 DIAGNOSIS — G250 Essential tremor: Secondary | ICD-10-CM | POA: Diagnosis not present

## 2016-04-09 DIAGNOSIS — Z23 Encounter for immunization: Secondary | ICD-10-CM | POA: Diagnosis not present

## 2016-04-13 DIAGNOSIS — Z7901 Long term (current) use of anticoagulants: Secondary | ICD-10-CM | POA: Diagnosis not present

## 2016-04-14 ENCOUNTER — Other Ambulatory Visit: Payer: Self-pay | Admitting: Family Medicine

## 2016-04-14 LAB — POCT INR: INR: 2.2 — AB (ref <=1.1)

## 2016-05-07 DIAGNOSIS — Z7901 Long term (current) use of anticoagulants: Secondary | ICD-10-CM | POA: Diagnosis not present

## 2016-05-08 LAB — POCT INR: INR: 2.2 — AB (ref 0.9–1.1)

## 2016-05-08 LAB — PROTIME-INR: Protime: 21.9 seconds — AB (ref 10.0–13.8)

## 2016-05-11 ENCOUNTER — Encounter: Payer: Self-pay | Admitting: Surgical

## 2016-05-12 ENCOUNTER — Other Ambulatory Visit: Payer: Self-pay

## 2016-05-12 DIAGNOSIS — Z1231 Encounter for screening mammogram for malignant neoplasm of breast: Secondary | ICD-10-CM

## 2016-05-13 ENCOUNTER — Telehealth: Payer: Self-pay | Admitting: Family Medicine

## 2016-05-13 NOTE — Telephone Encounter (Signed)
Please advise 

## 2016-05-13 NOTE — Telephone Encounter (Signed)
Patient's INR is 2.2. It is in the acceptable range. She should continue her current Coumadin regimen.

## 2016-05-13 NOTE — Telephone Encounter (Signed)
Pt called and was inquiring about her inr result, and what she needs to do with her medication. Please advise, thank you!  Call pt @ [phone]

## 2016-05-13 NOTE — Telephone Encounter (Signed)
Patient advised of results and verbalized understanding.  She needs results faxed to Sherol Dade RN [phone] fax [phone].    Patient would also like copy of results mailed to her .  Results faxed to Doylene Canning at [facility] . Reults mailed to patient.

## 2016-05-13 NOTE — Telephone Encounter (Signed)
She would also like to have these results mailed to her.

## 2016-05-27 ENCOUNTER — Other Ambulatory Visit: Payer: Self-pay | Admitting: Family Medicine

## 2016-05-27 MED ORDER — ALPRAZOLAM 0.5 MG PO TABS: ORAL_TABLET | ORAL | 1 refills | Status: DC

## 2016-05-27 MED ORDER — BISOPROLOL-HYDROCHLOROTHIAZIDE 5-6.25 MG PO TABS
1.0000 | ORAL_TABLET | Freq: Every day | ORAL | 3 refills | Status: DC
Start: 2016-05-27 — End: 2017-10-18

## 2016-05-27 MED ORDER — LETROZOLE 2.5 MG PO TABS: 2.5000 mg | ORAL_TABLET | Freq: Every day | ORAL | 0 refills | Status: DC

## 2016-05-27 NOTE — Telephone Encounter (Signed)
LM for patient to return call.

## 2016-05-27 NOTE — Telephone Encounter (Signed)
Refills provided. Letrozole is not a medication that I typically prescribe. I will provide a refill though she will need to get further refills from her surgeon. Other medications have been refilled.

## 2016-05-27 NOTE — Telephone Encounter (Signed)
Pt would like a call regarding her Rx's. Thank you!  Call pt @ [phone]

## 2016-05-27 NOTE — Telephone Encounter (Signed)
Spoke with patient and she needs a refill of Xanax, Ziac and letrozole. Is it ok to refill these. Needs to go to mail order pharmacy.

## 2016-06-01 ENCOUNTER — Other Ambulatory Visit: Payer: Self-pay | Admitting: Surgical

## 2016-06-01 ENCOUNTER — Telehealth: Payer: Self-pay | Admitting: Family Medicine

## 2016-06-01 MED ORDER — WARFARIN SODIUM 1 MG PO TABS: ORAL_TABLET | ORAL | 3 refills | Status: DC

## 2016-06-01 MED ORDER — WARFARIN SODIUM 3 MG PO TABS: ORAL_TABLET | ORAL | 1 refills | Status: DC

## 2016-06-01 MED ORDER — MONTELUKAST SODIUM 10 MG PO TABS: 10.0000 mg | ORAL_TABLET | Freq: Every day | ORAL | 4 refills | Status: DC

## 2016-06-01 NOTE — Telephone Encounter (Signed)
Singular and both coumadin has been sent to pharmacy

## 2016-06-01 NOTE — Telephone Encounter (Signed)
Pt called requesting refills for ALPRAZolam (XANAX) 0.5 MG tablet, bisoprolol-hydrochlorothiazide (ZIAC) 5-6.25 MG tablet, letrozole (FEMARA) 2.5 MG tablet, warfarin (COUMADIN) 3 MG tablet, warfarin (COUMADIN) 1 MG tablet, and montelukast (SINGULAIR) 10 MG tablet. Please advise, thank you!  Pharmacy - [facility] - [place], [place]  Call pt @ [phone]

## 2016-06-08 DIAGNOSIS — Z7901 Long term (current) use of anticoagulants: Secondary | ICD-10-CM | POA: Diagnosis not present

## 2016-06-09 LAB — PROTIME-INR: Protime: 21.7 seconds — AB (ref 10.0–13.8)

## 2016-06-09 LAB — POCT INR: INR: 2.2 — AB (ref 0.9–1.1)

## 2016-06-10 ENCOUNTER — Encounter: Payer: Self-pay | Admitting: Family Medicine

## 2016-06-11 ENCOUNTER — Ambulatory Visit (INDEPENDENT_AMBULATORY_CARE_PROVIDER_SITE_OTHER): Payer: Medicare Other | Admitting: Family Medicine

## 2016-06-11 ENCOUNTER — Encounter: Payer: Self-pay | Admitting: Family Medicine

## 2016-06-11 VITALS — BP 136/88 | HR 79 | Temp 98.2°F | Wt 146.4 lb

## 2016-06-11 DIAGNOSIS — H6120 Impacted cerumen, unspecified ear: Secondary | ICD-10-CM | POA: Insufficient documentation

## 2016-06-11 DIAGNOSIS — I10 Essential (primary) hypertension: Secondary | ICD-10-CM | POA: Diagnosis not present

## 2016-06-11 DIAGNOSIS — R251 Tremor, unspecified: Secondary | ICD-10-CM

## 2016-06-11 DIAGNOSIS — Z86711 Personal history of pulmonary embolism: Secondary | ICD-10-CM

## 2016-06-11 DIAGNOSIS — H04123 Dry eye syndrome of bilateral lacrimal glands: Secondary | ICD-10-CM

## 2016-06-11 DIAGNOSIS — G4700 Insomnia, unspecified: Secondary | ICD-10-CM | POA: Diagnosis not present

## 2016-06-11 DIAGNOSIS — H6121 Impacted cerumen, right ear: Secondary | ICD-10-CM

## 2016-06-11 LAB — BASIC METABOLIC PANEL
BUN: 13 mg/dL (ref 6–23)
CO2: 29 mEq/L (ref 19–32)
Calcium: 9.8 mg/dL (ref 8.4–10.5)
Chloride: 102 mEq/L (ref 96–112)
Creatinine, Ser: 0.97 mg/dL (ref 0.40–1.20)
GFR: 57.52 mL/min — ABNORMAL LOW (ref >60.00)
Glucose, Bld: 88 mg/dL (ref 70–99)
Potassium: 3.5 mEq/L (ref 3.5–5.1)
Sodium: 139 mEq/L (ref 135–145)

## 2016-06-11 LAB — CBC
HCT: 40.5 % (ref 36.0–46.0)
Hemoglobin: 13.6 g/dL (ref 12.0–15.0)
MCHC: 33.6 g/dL (ref 30.0–36.0)
MCV: 87 fl (ref 78.0–100.0)
Platelets: 281 10*3/uL (ref 150.0–400.0)
RBC: 4.65 Mil/uL (ref 3.87–5.11)
RDW: 13.6 % (ref 11.5–15.5)
WBC: 7.4 10*3/uL (ref 4.0–10.5)

## 2016-06-11 NOTE — Assessment & Plan Note (Signed)
She reports symptoms of dry eyes. May be some vision changes as well. Vision check today was overall reassuring. No obvious cause with the primidone for this which the patient was worried about. I advised her to see her eye doctor for follow-up of this issue.

## 2016-06-11 NOTE — Assessment & Plan Note (Signed)
At goal. Continue current medications. 

## 2016-06-11 NOTE — Progress Notes (Signed)
[name], MD Phone: [phone]  [name] is a 82 y.o. female who presents today for follow-up.  Sleep difficulty: Patient reports she goes to bed at about midnight or 1 AM. Wakes up around 5:30 typically. Takes Xanax half a tablet nightly to help her sleep. May or may not help.. This does not make her drowsy. She fell about 6 months ago. She notes no confusion.  History of PE/DVT: On Coumadin currently. No chest pain, shortness of breath, or leg swelling. She has IVC filter in place. Has been stable on her Coumadin recently. Most recent INR 2.2.  Hypertension: Patient checks occasionally and it can range from low to high. She takes Ziac. She notes no chest pain or shortness of breath.  Patient saw neurology for essential tremor. They started her on Neurontin, propranolol, and primidone. She has stopped the propranolol and primidone. She notes some tremor. She also notes that her eyes have been dry and had a burning sensation intermittently since starting on these medications. She does note a history of dry eyes. She notes some blurriness with watching TV though no eye pain, photophobia, or erythema to the eyes.  Patient additionally notes she occasionally gets earwax buildup in her years. She would like this checked.  PMH: Former smoker   ROS see history of present illness  Objective  Physical Exam Vitals:   06/11/16 1341  BP: 136/88  Pulse: 79  Temp: 98.2 F (36.8 C)    BP Readings from Last 3 Encounters:  06/11/16 136/88  03/11/16 136/78  01/15/16 (!) 179/99   Wt Readings from Last 3 Encounters:  06/11/16 146 lb 6.4 oz (66.4 kg)  03/11/16 141 lb 9.6 oz (64.2 kg)  01/15/16 144 lb 8 oz (65.5 kg)    Physical Exam  Constitutional: No distress.  HENT:  Head: Normocephalic and atraumatic.  Mouth/Throat: Oropharynx is clear and moist. No oropharyngeal exudate.  Right ear canal with moderate cerumen, left ear canal with minimal cerumen  Eyes: Conjunctivae and  EOM are normal. Pupils are equal, round, and reactive to light. Right eye exhibits no discharge. Left eye exhibits no discharge.  Cardiovascular: Normal rate, regular rhythm and normal heart sounds.   Pulmonary/Chest: Effort normal and breath sounds normal.  Musculoskeletal: She exhibits no edema.  No calf tenderness or swelling  Neurological: She is alert. Gait normal.  Skin: Skin is warm and dry. She is not diaphoretic.     Assessment/Plan: Please see individual problem list.  Hypertension At goal. Continue current medications.  History of pulmonary embolus (PE) Patient is asymptomatic. Currently taking Coumadin. Most recent INR 2.2. Has a IVC filter in place. We will continue to monitor. She will continue her current Coumadin dosing.  Insomnia Only takes half tablet of Xanax currently to sleep and is unsure if this actually helps. Given this and her age we will taper her off of the Xanax. She will do half a tablet every other day for 2 weeks and then discontinue.   Tremor Saw neurology and was diagnosed with essential tremor. Started on propranolol and primidone. She has stopped these medications. No obvious tremor on exam today. Neurologically intact. She'll continue to monitor this.  Dry eyes She reports symptoms of dry eyes. May be some vision changes as well. Vision check today was overall reassuring. No obvious cause with the primidone for this which the patient was worried about. I advised her to see her eye doctor for follow-up of this issue.  Cerumen impaction Patient noted she  would see the nurse at her living facility and have this irrigated.   Orders Placed This Encounter  Procedures  . Basic Metabolic Panel (BMET)  . CBC    [name], MD [place]

## 2016-06-11 NOTE — Progress Notes (Signed)
Pre visit review using our clinic review tool, if applicable. No additional management support is needed unless otherwise documented below in the visit note. 

## 2016-06-11 NOTE — Assessment & Plan Note (Signed)
Patient noted she would see the nurse at her living facility and have this irrigated.

## 2016-06-11 NOTE — Assessment & Plan Note (Addendum)
Only takes half tablet of Xanax currently to sleep and is unsure if this actually helps. Given this and her age we will taper her off of the Xanax. She will do half a tablet every other day for 2 weeks and then discontinue.

## 2016-06-11 NOTE — Assessment & Plan Note (Addendum)
Patient is asymptomatic. Currently taking Coumadin. Most recent INR 2.2. Has a IVC filter in place. We will continue to monitor. She will continue her current Coumadin dosing.

## 2016-06-11 NOTE — Patient Instructions (Signed)
Nice to see you. We are going to taper you off of your Xanax. You are going to take half a tablet every other day for 2 weeks and then discontinue the medication. Please continue your current Coumadin dosing. Please continue your blood pressure medicine. I would like for you to see your eye doctor. Please contact them to set up an appointment. If you develop chest pain, shortness of breath, unilateral leg swelling, or any new or change in symptoms please seek medical attention.

## 2016-06-11 NOTE — Assessment & Plan Note (Signed)
Saw neurology and was diagnosed with essential tremor. Started on propranolol and primidone. She has stopped these medications. No obvious tremor on exam today. Neurologically intact. She'll continue to monitor this.

## 2016-06-12 ENCOUNTER — Telehealth: Payer: Self-pay | Admitting: Family Medicine

## 2016-06-12 NOTE — Telephone Encounter (Signed)
Pt called back returning your call. Thank you!  Call pt @ [phone]

## 2016-06-15 NOTE — Telephone Encounter (Signed)
LM for patient to return call.

## 2016-06-16 NOTE — Telephone Encounter (Signed)
[name] spoke with her this morning.

## 2016-06-22 DIAGNOSIS — H16223 Keratoconjunctivitis sicca, not specified as Sjogren's, bilateral: Secondary | ICD-10-CM | POA: Diagnosis not present

## 2016-07-03 ENCOUNTER — Other Ambulatory Visit: Payer: Self-pay

## 2016-07-06 ENCOUNTER — Ambulatory Visit
Admission: RE | Admit: 2016-07-06 | Discharge: 2016-07-06 | Disposition: A | Discharge status: Home / Self Care | Payer: Medicare Other | Source: Ambulatory Visit | Attending: General Surgery | Admitting: General Surgery

## 2016-07-06 DIAGNOSIS — Z1231 Encounter for screening mammogram for malignant neoplasm of breast: Secondary | ICD-10-CM | POA: Diagnosis not present

## 2016-07-06 HISTORY — DX: Malignant neoplasm of unspecified site of unspecified female breast: C50.919

## 2016-07-07 ENCOUNTER — Encounter: Payer: Self-pay | Admitting: *Deleted

## 2016-07-08 DIAGNOSIS — Z7901 Long term (current) use of anticoagulants: Secondary | ICD-10-CM | POA: Diagnosis not present

## 2016-07-13 ENCOUNTER — Encounter: Payer: Self-pay | Admitting: General Surgery

## 2016-07-13 ENCOUNTER — Ambulatory Visit (INDEPENDENT_AMBULATORY_CARE_PROVIDER_SITE_OTHER): Payer: Medicare Other | Admitting: General Surgery

## 2016-07-13 VITALS — BP 120/66 | HR 70 | Resp 12 | Ht 59.0 in | Wt 139.0 lb

## 2016-07-13 DIAGNOSIS — R21 Rash and other nonspecific skin eruption: Secondary | ICD-10-CM | POA: Diagnosis not present

## 2016-07-13 DIAGNOSIS — Z853 Personal history of malignant neoplasm of breast: Secondary | ICD-10-CM | POA: Diagnosis not present

## 2016-07-13 NOTE — Patient Instructions (Signed)
The patient has been asked to return to the office in one year with a right diagnostic mammogram. 

## 2016-07-13 NOTE — Progress Notes (Signed)
Patient ID: [name], female   DOB: 06/25/1926, 82 y.o.   MRN: [id number]  Chief Complaint  Patient presents with  . Follow-up    mammogram    HPI [name] is a 82 y.o. female who presents for a breast evaluation. The most recent right breast mammogram was done on 07/06/2016.Marland KitchenPatient states she started itching all the way up and down her legs, this has been going on for two months.  Patient does perform regular self breast checks and gets regular mammograms done.    HPI  Past Medical History:  Diagnosis Date  . Allergy   . Breast cancer ([place]) 07/22/2012   T1c, Nx carcinoma left breast, ER 90%, PR 90%, HER-2/neu not over expressed.  . Hypertension   . Insomnia   . Osteoporosis    [place] 10/2012  . Pulmonary embolus ([place]) 2009  . S/P IVC filter 2009    Past Surgical History:  Procedure Laterality Date  . BLADDER SURGERY    . BREAST SURGERY Left 2013   mastectomy  . CHOLECYSTECTOMY    . HERNIA REPAIR    . MASTECTOMY Left 2013   BREAST CA  . REPLACEMENT TOTAL KNEE     right  . TONSILLECTOMY      Family History  Problem Relation Age of Onset  . COPD Mother   . Stroke Father   . Cancer Father     breast  . Breast cancer Maternal Aunt     Social History Social History  Substance Use Topics  . Smoking status: Former Smoker    Quit date: 05/13/1999  . Smokeless tobacco: Never Used  . Alcohol use No    Allergies  Allergen Reactions  . Erythromycin   . Sulfa Drugs Cross Reactors     rash    Current Outpatient Prescriptions  Medication Sig Dispense Refill  . ALPRAZolam (XANAX) 0.5 MG tablet TAKE 1 TABLET EVERY NIGHT AT BEDTIME AS NEEDED FOR SLEEP OR ANXIETY 90 tablet 1  . Biotin 10 MG TABS Take by mouth.    . bisoprolol-hydrochlorothiazide (ZIAC) 5-6.25 MG tablet Take 1 tablet by mouth daily. 90 tablet 3  . Cholecalciferol (VITAMIN D PO) Take 2,000 mg by mouth daily.    Marland Kitchen letrozole (FEMARA) 2.5 MG tablet Take 1 tablet (2.5 mg total) by mouth daily.  90 tablet 0  . montelukast (SINGULAIR) 10 MG tablet Take 1 tablet (10 mg total) by mouth at bedtime. 90 tablet 4  . warfarin (COUMADIN) 1 MG tablet Please take as directed by physician 100 tablet 3  . warfarin (COUMADIN) 3 MG tablet TAKE 1 TABLET DAILY 90 tablet 1   No current facility-administered medications for this visit.     Review of Systems Review of Systems  Constitutional: Negative.   Cardiovascular: Negative.     Blood pressure 120/66, pulse 70, resp. rate 12, height 4' 11"  (1.499 m), weight 139 lb (63 kg).  Physical Exam Physical Exam  Constitutional: She appears well-developed and well-nourished.  Eyes: Conjunctivae are normal. No scleral icterus.  Neck: Neck supple.  Cardiovascular: Normal rate, regular rhythm and normal heart sounds.   Pulmonary/Chest: Effort normal and breath sounds normal. Right breast exhibits no inverted nipple, no mass, no nipple discharge, no skin change and no tenderness.  Abdominal: Soft. Bowel sounds are normal. There is no tenderness.  Musculoskeletal:       Feet:  Lymphadenopathy:    She has no cervical adenopathy.  Neurological: She is alert.  Skin: Skin is warm and  dry.    Data Reviewed 07/06/2016 right breast mammogram reviewed. No interval change. BI-RADS-1.  Assessment    Unremarkable breast exam.  Skin irritation, unlikely related to underlying superficial varicosities.    Plan    No evidence of recurrent cancer.    The rash may be secondary to the underlying varicose veins, and it may simply be dry skin. The patient has been asked to make use of a trial of Caladryl lotion to see if this helps with her symptoms. She reports that she is wearing "compressive stockings" as recommended by the vascular service, although these appear fairly modest in their degree of compression. She might have difficulty donning heavier weight stockings.   The patient has been asked to return to the office in one year with a right diagnostic  mammogram. This information has been scribed by Gaspar Cola CMA.    Robert Bellow 07/13/2016, 8:59 PM

## 2016-07-15 ENCOUNTER — Telehealth: Payer: Self-pay | Admitting: Family Medicine

## 2016-07-15 NOTE — Telephone Encounter (Signed)
Pt stated that Gara Kroner is the nurse at [facility] is the one who drew the labs. Her phone number is [phone]

## 2016-07-15 NOTE — Telephone Encounter (Signed)
Left message to return call 

## 2016-07-15 NOTE — Telephone Encounter (Signed)
Please advise 

## 2016-07-15 NOTE — Telephone Encounter (Signed)
Please let the patient know that her INR is in the acceptable range given that she is on warfarin. There are no changes to her warfarin dosing. Thanks.

## 2016-07-15 NOTE — Telephone Encounter (Signed)
Patient ntoified

## 2016-07-15 NOTE — Telephone Encounter (Signed)
Pt called and stated that she is looking for her pt/inr results. I advised patient that we have received any results. Pt stated that on her copy the prothrombin time 2.9 high, and the prothrombin time 28.7 high. Pt had labs drawn on 12/14. Please advise, thank you!  Call pt @ [phone]

## 2016-07-28 ENCOUNTER — Telehealth: Payer: Self-pay | Admitting: *Deleted

## 2016-07-28 NOTE — Telephone Encounter (Signed)
Patient had mammogram and office visit in December. She wanted to talk to you regarding her results. She stated that she forgot to ask you about why her mammogram reports breast density. She is worried about this and wants to know how serious this can be.

## 2016-07-28 NOTE — Telephone Encounter (Signed)
Mammos OK.   <FU next year as scheduled.

## 2016-08-10 DIAGNOSIS — Z7901 Long term (current) use of anticoagulants: Secondary | ICD-10-CM | POA: Diagnosis not present

## 2016-08-14 ENCOUNTER — Telehealth: Payer: Self-pay

## 2016-08-14 NOTE — Telephone Encounter (Signed)
Patient notified

## 2016-08-14 NOTE — Telephone Encounter (Signed)
INR 2.4 PT 23.6

## 2016-08-14 NOTE — Telephone Encounter (Signed)
INR is in the acceptable range. Please contact the patient and let her know this. She should continue her current Coumadin dosing.

## 2016-08-18 ENCOUNTER — Telehealth: Payer: Self-pay | Admitting: Internal Medicine

## 2016-08-18 NOTE — Telephone Encounter (Signed)
INr was sent to me in error,  I see that dr [name] has already seen it and responded. plase make sure his name is on chart as PCP

## 2016-08-18 NOTE — Telephone Encounter (Signed)
It wasn't you.  A hard copy from [facility] has my name on it, so faciility must have wrong info

## 2016-08-18 NOTE — Telephone Encounter (Signed)
Patient notified and is Dr. Caryl [name] patient, not sure how I sent message to you and Dr. Caryl [name], but will check before sending further messages

## 2016-08-21 DIAGNOSIS — J0100 Acute maxillary sinusitis, unspecified: Secondary | ICD-10-CM | POA: Diagnosis not present

## 2016-08-24 ENCOUNTER — Telehealth: Payer: Self-pay | Admitting: Family Medicine

## 2016-08-24 NOTE — Telephone Encounter (Signed)
Agree the patient should continue current medications including doxycycline and Coumadin. Should have INR checked on 08/25/16. Would make changes based on INR.

## 2016-08-24 NOTE — Telephone Encounter (Signed)
I need more information regarding this. Did they check an INR? Did they give a reason for her to stop her coumadin? What urgent care did she go to? She really should not stop her coumadin unless her INR was elevated.

## 2016-08-24 NOTE — Telephone Encounter (Signed)
Please advise 

## 2016-08-24 NOTE — Telephone Encounter (Signed)
[name] from [place] [facility] [place] went to Urgent care for head pressure and she thinks that there was an interaction with the medication they gave her with her coumadin. They gave her doxcyxline hyclate 100 mg 1 tab 2 x daily, they also told her that she needs to be off for 5 days. Please advise, thank you!  Call pt @ [phone]

## 2016-08-24 NOTE — Telephone Encounter (Signed)
Per Dr.Sonnenberg, informed nurse to have patients inr checked tomorrow and continue current meds

## 2016-08-24 NOTE — Telephone Encounter (Signed)
Nurse states [facility] informed the patient not to take the cumidine for 5 days while on the doxy because it would mess up her labs. Patient has not stopped the cumidine but has instead cut back on it taking half the dose. Does she need to stop taking the cumidine or can she continue ?

## 2016-08-25 DIAGNOSIS — Z7901 Long term (current) use of anticoagulants: Secondary | ICD-10-CM | POA: Diagnosis not present

## 2016-08-25 LAB — PROTIME-INR: Protime: 15.4 seconds — AB (ref 10.0–13.8)

## 2016-08-25 LAB — POCT INR: INR: 1.5 — AB (ref 0.9–1.1)

## 2016-08-27 ENCOUNTER — Encounter: Payer: Self-pay | Admitting: Family Medicine

## 2016-08-27 NOTE — Telephone Encounter (Signed)
I have notified Cecille Rubin from [facility] of change and she will check the inr next week

## 2016-08-27 NOTE — Telephone Encounter (Signed)
Patient states she was taking half for only two days while on doxy.  Current dose Mon  Wed, Fri 4mg , Jacob Moores Sat Sunday 3mg   Per Dr. [name] informed patient to change the dose to 4mg  every day except tue and sat she will take 3mg .  [place] [phone]

## 2016-08-31 ENCOUNTER — Other Ambulatory Visit: Payer: Self-pay | Admitting: Family Medicine

## 2016-08-31 NOTE — Telephone Encounter (Signed)
Refill given. Patient should be advised that this is a medication that I do not manage. I will provide this refill, though future refills should come through her surgeon or oncologist. Thanks.

## 2016-08-31 NOTE — Telephone Encounter (Signed)
Last OV 06/11/16 last filled 05/27/16 90 0rf

## 2016-09-01 ENCOUNTER — Telehealth: Payer: Self-pay | Admitting: General Surgery

## 2016-09-01 DIAGNOSIS — Z7901 Long term (current) use of anticoagulants: Secondary | ICD-10-CM | POA: Diagnosis not present

## 2016-09-01 MED ORDER — LETROZOLE 2.5 MG PO TABS: 2.5000 mg | ORAL_TABLET | Freq: Every day | ORAL | 3 refills | Status: DC

## 2016-09-01 NOTE — Telephone Encounter (Signed)
Patient notified

## 2016-09-01 NOTE — Telephone Encounter (Signed)
RX sent to [facility] as requested.

## 2016-09-01 NOTE — Telephone Encounter (Signed)
Patient calling stating  Dr Ellen Henri ofc will no longer refill her Letrozole tab 2.5(generic for [facility]).She was told by their office Dr Bary Castilla would continue refilling this for her.She uses the [facility].Please call her home# [phone] to let her know.

## 2016-09-02 ENCOUNTER — Telehealth: Payer: Self-pay | Admitting: Family Medicine

## 2016-09-02 NOTE — Telephone Encounter (Signed)
Patient notified, left message with nurse at [place], will call again Thursday

## 2016-09-02 NOTE — Telephone Encounter (Signed)
Please let patient know that her INR is 3.6. She needs to follow the following regimen for her Coumadin.   Mon  Wed, Fri 4mg , Jacob Moores Sat Sunday 3mg   Thanks. We will need to recheck in one week.

## 2016-09-03 NOTE — Telephone Encounter (Signed)
Patient and nurse notified.

## 2016-09-08 DIAGNOSIS — R251 Tremor, unspecified: Secondary | ICD-10-CM | POA: Diagnosis not present

## 2016-09-09 DIAGNOSIS — Z86711 Personal history of pulmonary embolism: Secondary | ICD-10-CM | POA: Diagnosis not present

## 2016-09-09 DIAGNOSIS — Z7901 Long term (current) use of anticoagulants: Secondary | ICD-10-CM | POA: Diagnosis not present

## 2016-09-10 ENCOUNTER — Telehealth: Payer: Self-pay | Admitting: Family Medicine

## 2016-09-10 NOTE — Telephone Encounter (Signed)
Please let the patient know her INR is still elevated at 3.2. She needs to take 4 mg of Coumadin on Mondays and take 3 mg of Coumadin the other days of the week. We will need to recheck in 2 weeks. Thanks.

## 2016-09-10 NOTE — Telephone Encounter (Signed)
Nurse notified  

## 2016-09-10 NOTE — Telephone Encounter (Signed)
Patient notified, left message to return call for [facility]

## 2016-09-11 ENCOUNTER — Ambulatory Visit (INDEPENDENT_AMBULATORY_CARE_PROVIDER_SITE_OTHER): Payer: Medicare Other | Admitting: Family Medicine

## 2016-09-11 ENCOUNTER — Encounter: Payer: Self-pay | Admitting: Family Medicine

## 2016-09-11 VITALS — BP 140/86 | HR 74 | Temp 98.1°F | Wt 140.0 lb

## 2016-09-11 DIAGNOSIS — Z13820 Encounter for screening for osteoporosis: Secondary | ICD-10-CM

## 2016-09-11 DIAGNOSIS — I8393 Asymptomatic varicose veins of bilateral lower extremities: Secondary | ICD-10-CM | POA: Diagnosis not present

## 2016-09-11 DIAGNOSIS — Z86711 Personal history of pulmonary embolism: Secondary | ICD-10-CM | POA: Diagnosis not present

## 2016-09-11 DIAGNOSIS — M8580 Other specified disorders of bone density and structure, unspecified site: Secondary | ICD-10-CM

## 2016-09-11 DIAGNOSIS — R251 Tremor, unspecified: Secondary | ICD-10-CM

## 2016-09-11 DIAGNOSIS — I10 Essential (primary) hypertension: Secondary | ICD-10-CM | POA: Diagnosis not present

## 2016-09-11 NOTE — Assessment & Plan Note (Signed)
Due for bone density test next month. We will go ahead and place an order.

## 2016-09-11 NOTE — Progress Notes (Signed)
  [name], MD Phone: [phone]  [name] is a 83 y.o. female who presents today for f/u.  HYPERTENSION  Disease Monitoring  Home BP Monitoring not checking Chest pain- no    Dyspnea- no Medications  Compliance-  Taking bisoprolol, HCTZ  Edema- no  History of PE: Patient has been stable on Coumadin. No chest pain or shortness of breath. No leg swelling. She has an IVC filter in place as well. Most recent INR is 3.2.  Varicose veins: Patient notes she's had these for a long time. She has dry skin on her legs as well. She saw vascular surgery and reports they said they can do something for them though she should probably just monitor them. She has no pain with them. They have not worsened.  History of osteoporosis: Patient was previously on Prolia. Most recent DEXA scan was a little less than 2 years ago. No longer on Prolia.  Tremor: Patient saw neurology. They placed her on propranolol though she has not started this yet. She also reports they discussed that alcohol could limit her tremor.  PMH: Former smoker   ROS see history of present illness  Objective  Physical Exam Vitals:   09/11/16 1314  BP: 140/86  Pulse: 74  Temp: 98.1 F (36.7 C)    BP Readings from Last 3 Encounters:  09/11/16 140/86  07/13/16 120/66  06/11/16 136/88   Wt Readings from Last 3 Encounters:  09/11/16 140 lb (63.5 kg)  07/13/16 139 lb (63 kg)  06/11/16 146 lb 6.4 oz (66.4 kg)    Physical Exam  Constitutional: No distress.  HENT:  Head: Normocephalic and atraumatic.  Cardiovascular: Normal rate, regular rhythm and normal heart sounds.   Pulmonary/Chest: Effort normal and breath sounds normal.  Musculoskeletal: She exhibits no edema.  Neurological: She is alert. Gait normal.  Skin: Skin is warm and dry. She is not diaphoretic.  Nontender varicose veins noted bilateral lower extremities, no edema, no calf tenderness     Assessment/Plan: Please see individual problem  list.  Hypertension At goal for age. Continue current medications.  History of pulmonary embolus (PE) Most recent INR 3.2. She was advised on Coumadin dosing 4 mg on Mondays and 3 mg all the other days of the week. Recheck INR in 2 weeks.  Tremor Encouraged patient to try the propranolol. She'll continue to monitor.  Varicose veins of both lower extremities Patient with asymptomatic varicose veins. Discussed referral back to vascular surgery though she declined and she'll continue to monitor.  Osteopenia Due for bone density test next month. We will go ahead and place an order.   [name], MD [place]

## 2016-09-11 NOTE — Assessment & Plan Note (Signed)
At goal for age. Continue current medications. 

## 2016-09-11 NOTE — Assessment & Plan Note (Signed)
Patient with asymptomatic varicose veins. Discussed referral back to vascular surgery though she declined and she'll continue to monitor.

## 2016-09-11 NOTE — Progress Notes (Signed)
Pre visit review using our clinic review tool, if applicable. No additional management support is needed unless otherwise documented below in the visit note. 

## 2016-09-11 NOTE — Assessment & Plan Note (Signed)
Most recent INR 3.2. She was advised on Coumadin dosing 4 mg on Mondays and 3 mg all the other days of the week. Recheck INR in 2 weeks.

## 2016-09-11 NOTE — Assessment & Plan Note (Signed)
Encouraged patient to try the propranolol. She'll continue to monitor.

## 2016-09-11 NOTE — Patient Instructions (Signed)
Nice to see you. Please continue your blood pressure medications. Please continue your Coumadin with taking 4 mg on Mondays and 3 mg all the other days of the week. We will plan on getting a bone density test done late in March or early in April to reevaluate shoe for osteoporosis. Please monitor your varicose veins and if they worsen or become painful please let us know.

## 2016-09-14 NOTE — Progress Notes (Signed)
Lori notified and would like for you to write an order and ill fax it please put [id number] on order Fax [phone]

## 2016-09-14 NOTE — Progress Notes (Signed)
Left message to return call 

## 2016-09-17 DIAGNOSIS — H16223 Keratoconjunctivitis sicca, not specified as Sjogren's, bilateral: Secondary | ICD-10-CM | POA: Diagnosis not present

## 2016-09-23 DIAGNOSIS — Z7901 Long term (current) use of anticoagulants: Secondary | ICD-10-CM | POA: Diagnosis not present

## 2016-10-22 DIAGNOSIS — Z7901 Long term (current) use of anticoagulants: Secondary | ICD-10-CM | POA: Diagnosis not present

## 2016-10-22 LAB — PROTIME-INR: Protime: 22.8 seconds — AB (ref 10.0–13.8)

## 2016-10-22 LAB — POCT INR: INR: 2.3 — AB (ref 0.9–1.1)

## 2016-10-26 ENCOUNTER — Encounter: Payer: Self-pay | Admitting: Family Medicine

## 2016-10-28 ENCOUNTER — Telehealth: Payer: Self-pay | Admitting: *Deleted

## 2016-10-28 NOTE — Telephone Encounter (Signed)
Patient's INR was in the acceptable range. She should continue her current Coumadin regimen.

## 2016-10-28 NOTE — Telephone Encounter (Signed)
Patient has requested a call to discuss her proper amount of coumadin she should take this month . Pt contact [phone] A message can be left on the voicemail

## 2016-10-28 NOTE — Telephone Encounter (Signed)
Please advise 

## 2016-10-28 NOTE — Telephone Encounter (Signed)
Patient notified

## 2016-11-18 DIAGNOSIS — Z7901 Long term (current) use of anticoagulants: Secondary | ICD-10-CM | POA: Diagnosis not present

## 2016-11-19 ENCOUNTER — Telehealth: Payer: Self-pay | Admitting: General Surgery

## 2016-11-19 LAB — PROTIME-INR: Protime: 23.4 seconds — AB (ref 10.0–13.8)

## 2016-11-19 LAB — POCT INR: INR: 2.3 — AB (ref 0.9–1.1)

## 2016-11-19 NOTE — Telephone Encounter (Signed)
PATIENT CALLED & WOULD LIKE A SCRIPT FOR PROSTHETIST.PLEASE MAILED TO PATIENT.

## 2016-11-20 ENCOUNTER — Encounter: Payer: Self-pay | Admitting: Family Medicine

## 2016-11-20 ENCOUNTER — Telehealth: Payer: Self-pay

## 2016-11-20 NOTE — Telephone Encounter (Signed)
Patient notified inr is acceptable and to continue current does, repeat in one month

## 2016-12-11 ENCOUNTER — Ambulatory Visit: Payer: Medicare Other | Admitting: Family Medicine

## 2016-12-14 ENCOUNTER — Ambulatory Visit (INDEPENDENT_AMBULATORY_CARE_PROVIDER_SITE_OTHER): Payer: Medicare Other | Admitting: Family Medicine

## 2016-12-14 ENCOUNTER — Encounter: Payer: Self-pay | Admitting: Family Medicine

## 2016-12-14 VITALS — BP 138/74 | HR 69 | Temp 98.4°F | Resp 12 | Wt 140.0 lb

## 2016-12-14 DIAGNOSIS — W19XXXA Unspecified fall, initial encounter: Secondary | ICD-10-CM | POA: Diagnosis not present

## 2016-12-14 DIAGNOSIS — I10 Essential (primary) hypertension: Secondary | ICD-10-CM

## 2016-12-14 DIAGNOSIS — J301 Allergic rhinitis due to pollen: Secondary | ICD-10-CM

## 2016-12-14 DIAGNOSIS — F419 Anxiety disorder, unspecified: Secondary | ICD-10-CM

## 2016-12-14 DIAGNOSIS — R351 Nocturia: Secondary | ICD-10-CM | POA: Diagnosis not present

## 2016-12-14 LAB — POCT URINALYSIS DIPSTICK
Bilirubin, UA: NEGATIVE
Glucose, UA: NEGATIVE
Ketones, UA: NEGATIVE
Nitrite, UA: NEGATIVE
Protein, UA: NEGATIVE
Spec Grav, UA: 1.025 (ref 1.010–1.025)
Urobilinogen, UA: 0.2 E.U./dL
pH, UA: 5.5 (ref 5.0–8.0)

## 2016-12-14 MED ORDER — FLUTICASONE PROPIONATE 50 MCG/ACT NA SUSP: 2.0000 | Freq: Every day | NASAL | 6 refills | Status: DC

## 2016-12-14 NOTE — Assessment & Plan Note (Signed)
Change to Flonase

## 2016-12-14 NOTE — Assessment & Plan Note (Signed)
I suspect some of this is due to the fact that she is taking her hydrochlorothiazide night before bed. We will have her change this to a.m. dosing. We'll check a urinalysis and a CMP as well. She'll continue to monitor.

## 2016-12-14 NOTE — Progress Notes (Signed)
Pre-visit discussion using our clinic review tool. No additional management support is needed unless otherwise documented below in the visit note.  

## 2016-12-14 NOTE — Progress Notes (Signed)
  [name], MD Phone: [phone]  [name] is a 83 y.o. female who presents today for follow-up.  Allergic rhinitis: Notes she feels as though her Singulair is not as beneficial as previously. During allergy season she gets rhinorrhea, sneezing, and congestion. She's tried over-the-counter medications with some minimal benefit. Not taking Flonase.  She does note her anxiety slightly worse as her son is dealing with [facility] spotted fever. She's quite upset about that. She notes no depression. She does take Xanax half a tablet for sleep.  Hypertension: Not checking at home. She is taking her medications. No chest pain or shortness of breath. Notes minimal right ankle edema for a few months. It will go down after propping it up. No calf swelling. No injury.  She does note she fell over in the bathroom when trying to pull her pants down this past weekend. She noted no head injury. No dizziness. No other injuries. No loss of consciousness. She just couldn't get her pants down and then fell to her side. Landed on her knees. No pain currently.  She notes she's been getting up several times at night to urinate recently. She has no dysuria or urgency. She wonders if she can use milk of magnesia.  PMH: Former smoker   ROS see history of present illness  Objective  Physical Exam Vitals:   12/14/16 1345  BP: 138/74  Pulse: 69  Resp: 12  Temp: 98.4 F (36.9 C)    BP Readings from Last 3 Encounters:  12/14/16 138/74  09/11/16 140/86  07/13/16 120/66   Wt Readings from Last 3 Encounters:  12/14/16 140 lb (63.5 kg)  09/11/16 140 lb (63.5 kg)  07/13/16 139 lb (63 kg)    Physical Exam  Constitutional: No distress.  HENT:  Head: Normocephalic and atraumatic.  Mouth/Throat: Oropharynx is clear and moist. No oropharyngeal exudate.  Eyes: Conjunctivae are normal. Pupils are equal, round, and reactive to light.  Cardiovascular: Normal rate, regular rhythm and normal heart  sounds.   Pulmonary/Chest: Effort normal and breath sounds normal.  Musculoskeletal: She exhibits no edema.  Bilateral knees with no tenderness  Neurological: She is alert. Gait normal.  Skin: She is not diaphoretic.     Assessment/Plan: Please see individual problem list.  Hypertension At goal for age. Continue current medication.  Seasonal allergic rhinitis Change to Flonase.  Anxiety Patient with mostly anxiety. No depression. She'll continue Xanax for sleep. Monitor her anxiety and if worsen she'll let us know.  Fall Single fall onto her knees. No injuries. Discussed fall precautions for when in the bathroom.  Nocturia I suspect some of this is due to the fact that she is taking her hydrochlorothiazide night before bed. We will have her change this to a.m. dosing. We'll check a urinalysis and a CMP as well. She'll continue to monitor.   Orders Placed This Encounter  Procedures  . POCT Urinalysis Dipstick    Meds ordered this encounter  Medications  . DISCONTD: fluticasone (FLONASE) 50 MCG/ACT nasal spray    Sig: Place 2 sprays into both nostrils daily.    Dispense:  16 g    Refill:  6  . fluticasone (FLONASE) 50 MCG/ACT nasal spray    Sig: Place 2 sprays into both nostrils daily.    Dispense:  16 g    Refill:  [place], MD [place]

## 2016-12-14 NOTE — Assessment & Plan Note (Signed)
Patient with mostly anxiety. No depression. She'll continue Xanax for sleep. Monitor her anxiety and if worsen she'll let us know.

## 2016-12-14 NOTE — Patient Instructions (Signed)
Nice to see you. We'll start you on Flonase for your allergies. Please monitor your anxiety. If this worsens please let us know. Please monitor the swelling in your right ankle and if this does not improve please let us know. We will check some lab work and contact you with the results.

## 2016-12-14 NOTE — Assessment & Plan Note (Signed)
Single fall onto her knees. No injuries. Discussed fall precautions for when in the bathroom.

## 2016-12-14 NOTE — Assessment & Plan Note (Signed)
At goal for age. Continue current medication. 

## 2016-12-15 ENCOUNTER — Telehealth: Payer: Self-pay | Admitting: Family Medicine

## 2016-12-15 DIAGNOSIS — R351 Nocturia: Secondary | ICD-10-CM | POA: Diagnosis not present

## 2016-12-15 LAB — URINALYSIS, MICROSCOPIC ONLY

## 2016-12-15 NOTE — Addendum Note (Signed)
Addended by: Arby Barrette on: 12/15/2016 08:11 AM   Modules accepted: Orders

## 2016-12-16 LAB — URINE CULTURE

## 2016-12-22 DIAGNOSIS — Z961 Presence of intraocular lens: Secondary | ICD-10-CM | POA: Diagnosis not present

## 2016-12-23 DIAGNOSIS — Z7901 Long term (current) use of anticoagulants: Secondary | ICD-10-CM | POA: Diagnosis not present

## 2016-12-23 DIAGNOSIS — R351 Nocturia: Secondary | ICD-10-CM | POA: Diagnosis not present

## 2016-12-23 LAB — HEPATIC FUNCTION PANEL
ALT: 6 U/L — AB (ref 7–35)
AST: 12 U/L — AB (ref 13–35)
Alkaline Phosphatase: 78 U/L (ref 25–125)
Bilirubin, Total: 0.4 mg/dL

## 2016-12-23 LAB — BASIC METABOLIC PANEL
BUN: 12 mg/dL (ref 4–21)
Creatinine: 1 mg/dL (ref 0.5–1.1)
Glucose: 139 mg/dL
Potassium: 4.3 mmol/L (ref 3.4–5.3)
Sodium: 141 mmol/L (ref 137–147)

## 2016-12-23 LAB — PROTIME-INR: Protime: 22.9 seconds — AB (ref 10.0–13.8)

## 2016-12-23 LAB — POCT INR: INR: 2.3 — AB (ref 0.9–1.1)

## 2016-12-25 ENCOUNTER — Telehealth: Payer: Self-pay

## 2016-12-25 ENCOUNTER — Encounter: Payer: Self-pay | Admitting: Family Medicine

## 2016-12-25 NOTE — Telephone Encounter (Signed)
Patient notified

## 2016-12-25 NOTE — Telephone Encounter (Signed)
Left message to return call to inform patient inr is 2.3 and to continue current regiment

## 2017-01-19 DIAGNOSIS — Z7901 Long term (current) use of anticoagulants: Secondary | ICD-10-CM | POA: Diagnosis not present

## 2017-01-19 LAB — PROTIME-INR: Protime: 27.2 — AB (ref 10.0–13.8)

## 2017-01-19 LAB — POCT INR: INR: 2.7 — AB (ref 0.9–1.1)

## 2017-01-20 ENCOUNTER — Telehealth: Payer: Self-pay

## 2017-01-20 ENCOUNTER — Encounter: Payer: Self-pay | Admitting: Family Medicine

## 2017-01-20 NOTE — Telephone Encounter (Signed)
PT-27.2 INR- 2.7 Mondays takes 4 mg of warfarin, and Tuesday-Sunday 3 mg warfarin.  Patient was advise keep current regimen and she states she checks her PT/INR monthly.

## 2017-01-27 DIAGNOSIS — M542 Cervicalgia: Secondary | ICD-10-CM | POA: Diagnosis not present

## 2017-01-29 ENCOUNTER — Ambulatory Visit (INDEPENDENT_AMBULATORY_CARE_PROVIDER_SITE_OTHER): Payer: Medicare Other | Admitting: Family Medicine

## 2017-01-29 ENCOUNTER — Encounter: Payer: Self-pay | Admitting: Family Medicine

## 2017-01-29 VITALS — BP 146/76 | HR 85 | Temp 98.1°F | Ht 59.0 in | Wt 138.8 lb

## 2017-01-29 DIAGNOSIS — S46812A Strain of other muscles, fascia and tendons at shoulder and upper arm level, left arm, initial encounter: Secondary | ICD-10-CM | POA: Diagnosis not present

## 2017-01-29 DIAGNOSIS — R251 Tremor, unspecified: Secondary | ICD-10-CM

## 2017-01-29 MED ORDER — BACLOFEN 10 MG PO TABS: 5.0000 mg | ORAL_TABLET | Freq: Three times a day (TID) | ORAL | 0 refills | Status: DC | PRN

## 2017-01-29 NOTE — Progress Notes (Signed)
Pre visit review using our clinic review tool, if applicable. No additional management support is needed unless otherwise documented below in the visit note. 

## 2017-01-29 NOTE — Assessment & Plan Note (Signed)
I suspect trapezius strain as cause of her symptoms. She is neurologically intact in her upper and lower extremities. Discussed heat and being careful not to burn herself with the heat. Discussed Tylenol. Discussed baclofen as a muscle relaxer. Advised this could make her drowsy so she should be careful. We'll get her to do some physical therapy at [facility]. Return precautions in AVS.

## 2017-01-29 NOTE — Patient Instructions (Signed)
Nice to see you. I believe you have strained your trapezius muscle where it attaches into your neck. We will have you do physical therapy. You can continue heat. You can continue Tylenol. We'll try baclofen as a muscle relaxer. Please be careful as this may make you drowsy. If you develop numbness, weakness, or worsening pain please seek medical attention.

## 2017-01-29 NOTE — Assessment & Plan Note (Signed)
Patient notes continued issues with her tremor. Some days are worse than others. She was evaluated by neurology. She wonders if she should be evaluated by a hand specialist. I advised that a hand specialist would not be the person to take care of her tremor and that we could refer her to a different neurologist. She will think on this and let us know.

## 2017-01-29 NOTE — Progress Notes (Signed)
  [name], MD Phone: [phone]  Coreena Rubalcava is a 83 y.o. female who presents today for same-day visit.  Neck pain: Patient notes 3 weeks of left-sided neck discomfort. It occurs in the trapezius. Hurts if she has to reach over her head. No known injury. No radiation. No numbness or weakness. Occasionally it is sharp. It is there most of the time. She has tried multiple creams and patches and also Tylenol with little benefit. She was seen at urgent care and prescribed tramadol although this made her quite nervous and anxious.  PMH: former smoker   ROS see history of present illness  Objective  Physical Exam Vitals:   01/29/17 1014  BP: (!) 146/76  Pulse: 85  Temp: 98.1 F (36.7 C)    BP Readings from Last 3 Encounters:  01/29/17 (!) 146/76  12/14/16 138/74  09/11/16 140/86   Wt Readings from Last 3 Encounters:  01/29/17 138 lb 12.8 oz (63 kg)  12/14/16 140 lb (63.5 kg)  09/11/16 140 lb (63.5 kg)    Physical Exam  Constitutional: No distress.  Neck: Neck supple.    Cardiovascular: Normal rate, regular rhythm and normal heart sounds.   Pulmonary/Chest: Effort normal and breath sounds normal.  Skin: She is not diaphoretic.     Assessment/Plan: Please see individual problem list.  Trapezius strain, left, initial encounter I suspect trapezius strain as cause of her symptoms. She is neurologically intact in her upper and lower extremities. Discussed heat and being careful not to burn herself with the heat. Discussed Tylenol. Discussed baclofen as a muscle relaxer. Advised this could make her drowsy so she should be careful. We'll get her to do some physical therapy at [facility]. Return precautions in AVS.  Tremor Patient notes continued issues with her tremor. Some days are worse than others. She was evaluated by neurology. She wonders if she should be evaluated by a hand specialist. I advised that a hand specialist would not be the person to take care of her  tremor and that we could refer her to a different neurologist. She will think on this and let us know.   Orders Placed This Encounter  Procedures  . Ambulatory referral to Physical Therapy    Referral Priority:   Routine    Referral Type:   Physical Medicine    Referral Reason:   Specialty Services Required    Requested Specialty:   Physical Therapy    Number of Visits Requested:   1    Meds ordered this encounter  Medications  . baclofen (LIORESAL) 10 MG tablet    Sig: Take 0.5 tablets (5 mg total) by mouth 3 (three) times daily as needed for muscle spasms.    Dispense:  10 each    Refill:  0   [name], MD [place]

## 2017-02-02 DIAGNOSIS — M6281 Muscle weakness (generalized): Secondary | ICD-10-CM | POA: Diagnosis not present

## 2017-02-04 DIAGNOSIS — M6281 Muscle weakness (generalized): Secondary | ICD-10-CM | POA: Diagnosis not present

## 2017-02-05 ENCOUNTER — Other Ambulatory Visit: Payer: Self-pay | Admitting: *Deleted

## 2017-02-05 MED ORDER — BACLOFEN 10 MG PO TABS: 5.0000 mg | ORAL_TABLET | Freq: Three times a day (TID) | ORAL | 0 refills | Status: DC | PRN

## 2017-02-05 NOTE — Telephone Encounter (Signed)
Last OV 01/29/17 last filled 01/29/17 10 0rf

## 2017-02-05 NOTE — Telephone Encounter (Signed)
Pt has requested a medication refill for Baclofen , pt reported continue pain in her neck .  Pharmacy [facility]  Pt contact [phone]

## 2017-02-08 DIAGNOSIS — M6281 Muscle weakness (generalized): Secondary | ICD-10-CM | POA: Diagnosis not present

## 2017-02-10 DIAGNOSIS — M6281 Muscle weakness (generalized): Secondary | ICD-10-CM | POA: Diagnosis not present

## 2017-02-15 ENCOUNTER — Telehealth: Payer: Self-pay | Admitting: *Deleted

## 2017-02-15 DIAGNOSIS — M6281 Muscle weakness (generalized): Secondary | ICD-10-CM | POA: Diagnosis not present

## 2017-02-15 NOTE — Telephone Encounter (Signed)
Please advise 

## 2017-02-15 NOTE — Telephone Encounter (Signed)
We could reevaluate her muscle spasms. Please see if she has been doing physical therapy. Please see that has been beneficial. Please find out if she is having issues with her ears. If so I would suggest evaluation the office to determine what is going on. Thanks.

## 2017-02-15 NOTE — Telephone Encounter (Signed)
Patient has requested to have a order to have her ears cleaned.  Pt also continue to have muscle spasms, they are not as bad, especially when she drives .  Please advise Pt contact [phone]

## 2017-02-16 ENCOUNTER — Encounter: Payer: Self-pay | Admitting: Family Medicine

## 2017-02-16 ENCOUNTER — Ambulatory Visit (INDEPENDENT_AMBULATORY_CARE_PROVIDER_SITE_OTHER): Payer: Medicare Other

## 2017-02-16 ENCOUNTER — Ambulatory Visit (INDEPENDENT_AMBULATORY_CARE_PROVIDER_SITE_OTHER): Payer: Medicare Other | Admitting: Family Medicine

## 2017-02-16 ENCOUNTER — Other Ambulatory Visit: Payer: Self-pay | Admitting: Family Medicine

## 2017-02-16 VITALS — BP 130/80 | HR 74 | Temp 98.1°F | Wt 139.6 lb

## 2017-02-16 DIAGNOSIS — M542 Cervicalgia: Secondary | ICD-10-CM

## 2017-02-16 DIAGNOSIS — M5030 Other cervical disc degeneration, unspecified cervical region: Secondary | ICD-10-CM

## 2017-02-16 DIAGNOSIS — S46812A Strain of other muscles, fascia and tendons at shoulder and upper arm level, left arm, initial encounter: Secondary | ICD-10-CM | POA: Diagnosis not present

## 2017-02-16 DIAGNOSIS — J0100 Acute maxillary sinusitis, unspecified: Secondary | ICD-10-CM | POA: Diagnosis not present

## 2017-02-16 DIAGNOSIS — M50223 Other cervical disc displacement at C6-C7 level: Secondary | ICD-10-CM | POA: Diagnosis not present

## 2017-02-16 DIAGNOSIS — M50322 Other cervical disc degeneration at C5-C6 level: Secondary | ICD-10-CM | POA: Diagnosis not present

## 2017-02-16 DIAGNOSIS — J329 Chronic sinusitis, unspecified: Secondary | ICD-10-CM | POA: Insufficient documentation

## 2017-02-16 MED ORDER — AMOXICILLIN-POT CLAVULANATE 875-125 MG PO TABS: 1.0000 | ORAL_TABLET | Freq: Two times a day (BID) | ORAL | 0 refills | Status: DC

## 2017-02-16 MED ORDER — BACLOFEN 10 MG PO TABS: 5.0000 mg | ORAL_TABLET | Freq: Three times a day (TID) | ORAL | 0 refills | Status: DC | PRN

## 2017-02-16 NOTE — Assessment & Plan Note (Signed)
Continues to have issues with left posterior neck pain. Discomfort is in the mid trapezius. Given persistence despite physical therapy will obtain an x-ray of her cervical spine. We'll determine then if she needs to see orthopedics. We'll refill her baclofen.

## 2017-02-16 NOTE — Telephone Encounter (Signed)
Patient is coming in today for appmt

## 2017-02-16 NOTE — Assessment & Plan Note (Signed)
Given duration and symptoms suspect sinusitis. We'll cover with Augmentin. Patient does report she typically gets stomach upset with biotics and I advised eating yogurt or taking a probiotic to help with this. If she develops diarrhea or stomach upset she can let us know. She reports she has an appointment with ENT next week and she will keep this as well.

## 2017-02-16 NOTE — Progress Notes (Signed)
[name], MD Phone: [phone]  [name] is a 83 y.o. female who presents today for follow-up.  Patient notes 2-3 weeks of ear and sinus congestion. Notes some rhinorrhea. Not much postnasal drip. Lung clear mucus out of her nose. Had some chills intermittently. No ringing in ears. No true vertigo though a little off balance when she stands up. She saw the audiologist and had her ears cleaned out. Does note some decreased hearing.  Notes her neck is still bothering her in her left trapezius. She has done physical therapy and this has helped some of the neck still aches and pains are at times. No radiation or numbness. Tylenol does help though she only takes it infrequently.  PMH: Former smoker   ROS see history of present illness  Objective  Physical Exam Vitals:   02/16/17 1301  BP: 130/80  Pulse: 74  Temp: 98.1 F (36.7 C)    BP Readings from Last 3 Encounters:  02/16/17 130/80  01/29/17 (!) 146/76  12/14/16 138/74   Wt Readings from Last 3 Encounters:  02/16/17 139 lb 9.6 oz (63.3 kg)  01/29/17 138 lb 12.8 oz (63 kg)  12/14/16 140 lb (63.5 kg)    Physical Exam  Constitutional: No distress.  HENT:  Head: Normocephalic and atraumatic.  Mouth/Throat: Oropharynx is clear and moist. No oropharyngeal exudate.  Normal TMs bilaterally, tenderness to percussion of maxillary sinuses bilaterally  Eyes: Pupils are equal, round, and reactive to light. Conjunctivae are normal.  Cardiovascular: Normal rate, regular rhythm and normal heart sounds.   Pulmonary/Chest: Effort normal and breath sounds normal.  Musculoskeletal: She exhibits no edema.  No midline neck tenderness, no midline neck step-off, mild tenderness left mid trapezius  Neurological: She is alert. Gait normal.  5/5 strength bilateral biceps, triceps, and grip, sensation light touch intact in bilateral upper extremities  Skin: She is not diaphoretic.     Assessment/Plan: Please see individual  problem list.  Sinusitis Given duration and symptoms suspect sinusitis. We'll cover with Augmentin. Patient does report she typically gets stomach upset with biotics and I advised eating yogurt or taking a probiotic to help with this. If she develops diarrhea or stomach upset she can let us know. She reports she has an appointment with ENT next week and she will keep this as well.  Trapezius strain, left, initial encounter Continues to have issues with left posterior neck pain. Discomfort is in the mid trapezius. Given persistence despite physical therapy will obtain an x-ray of her cervical spine. We'll determine then if she needs to see orthopedics. We'll refill her baclofen.   Orders Placed This Encounter  Procedures  . DG Cervical Spine Complete    Standing Status:   Future    Number of Occurrences:   1    Standing Expiration Date:   04/19/2018    Order Specific Question:   Reason for Exam (SYMPTOM  OR DIAGNOSIS REQUIRED)    Answer:   persistent neck pain, posterior left sided, no radiation    Order Specific Question:   Preferred imaging location?    Answer:   [facility] Specific Question:   Radiology Contrast Protocol - do NOT remove file path    Answer:   [email or web address]    Meds ordered this encounter  Medications  . amoxicillin-clavulanate (AUGMENTIN) 875-125 MG tablet    Sig: Take 1 tablet by mouth 2 (two) times daily.    Dispense:  14 tablet    Refill:  0  . baclofen (LIORESAL) 10 MG tablet    Sig: Take 0.5 tablets (5 mg total) by mouth 3 (three) times daily as needed for muscle spasms.    Dispense:  10 each    Refill:  0    [name], MD [place]

## 2017-02-16 NOTE — Telephone Encounter (Incomplete)
Patient states she had physical therapy last week and this week. Patient states she is more concerned with her balance Muscle spasms are better since physical therapy.Patient states her ears are plugged up and only hurt because of her hearing aid.

## 2017-02-16 NOTE — Patient Instructions (Signed)
Nice to see you. We are going to treat you for a sinus infection with Augmentin. I believe this is leading to your symptoms. If they do not improve you will need to see ENT. We'll obtain an x-ray of your neck. If you have worsening symptoms please be reevaluated.

## 2017-02-17 DIAGNOSIS — M6281 Muscle weakness (generalized): Secondary | ICD-10-CM | POA: Diagnosis not present

## 2017-02-19 ENCOUNTER — Telehealth: Payer: Self-pay | Admitting: Family Medicine

## 2017-02-19 MED ORDER — DOXYCYCLINE HYCLATE 100 MG PO TABS: 100.0000 mg | ORAL_TABLET | Freq: Two times a day (BID) | ORAL | 0 refills | Status: DC

## 2017-02-19 NOTE — Telephone Encounter (Signed)
Pt called about having some real bad side effects from the medication of amoxicillin-clavulanate (AUGMENTIN) 875-125 MG tablet. Pt is having constant diarrhea, some cramping some nausea no vomiting. Please advise?  Call pt @ [phone]. Thank you!

## 2017-02-19 NOTE — Telephone Encounter (Signed)
Please advise 

## 2017-02-19 NOTE — Telephone Encounter (Signed)
She should discontinue the Augmentin. She should start taking a probiotic. We can send in doxycycline for her to try. We'll need to check an INR on Monday after starting the doxycycline. If she has issues with bleeding while on the doxycycline she should be evaluated. If she develops abdominal pain, fevers, or blood in her stool she needs to be evaluated. Thanks.

## 2017-02-19 NOTE — Telephone Encounter (Signed)
Patient has been informed and patient stated she will not receive her medication till Tuesday-Wednesday.  patient stated she will have blood drawn three days after starting the doxy.

## 2017-02-22 DIAGNOSIS — M6281 Muscle weakness (generalized): Secondary | ICD-10-CM | POA: Diagnosis not present

## 2017-02-23 DIAGNOSIS — H903 Sensorineural hearing loss, bilateral: Secondary | ICD-10-CM | POA: Diagnosis not present

## 2017-02-23 DIAGNOSIS — H6123 Impacted cerumen, bilateral: Secondary | ICD-10-CM | POA: Diagnosis not present

## 2017-02-23 DIAGNOSIS — H6980 Other specified disorders of Eustachian tube, unspecified ear: Secondary | ICD-10-CM | POA: Diagnosis not present

## 2017-02-25 DIAGNOSIS — Z7901 Long term (current) use of anticoagulants: Secondary | ICD-10-CM | POA: Diagnosis not present

## 2017-02-25 LAB — POCT INR: INR: 3.6 — AB (ref 0.9–1.1)

## 2017-02-25 LAB — PROTIME-INR: Protime: 34.6 — AB (ref 10.0–13.8)

## 2017-02-26 ENCOUNTER — Encounter: Payer: Self-pay | Admitting: Family Medicine

## 2017-02-26 ENCOUNTER — Telehealth: Payer: Self-pay | Admitting: Family Medicine

## 2017-02-26 NOTE — Telephone Encounter (Signed)
Please let the patient know that her INR is 3.6. I suspect this is related to her being on doxycycline. She should hold her next dose of warfarin and then resume her normal dosing. We will need to recheck this Monday of next week.

## 2017-02-26 NOTE — Telephone Encounter (Signed)
Patient notified and will have it rechecked monday

## 2017-03-02 DIAGNOSIS — Z7901 Long term (current) use of anticoagulants: Secondary | ICD-10-CM | POA: Diagnosis not present

## 2017-03-02 LAB — PROTIME-INR: Protime: 14.8 — AB (ref 10.0–13.8)

## 2017-03-02 LAB — POCT INR: INR: 1.5 — AB (ref 0.9–1.1)

## 2017-03-04 ENCOUNTER — Encounter: Payer: Self-pay | Admitting: Family Medicine

## 2017-03-04 ENCOUNTER — Telehealth: Payer: Self-pay | Admitting: Family Medicine

## 2017-03-04 NOTE — Telephone Encounter (Signed)
Please let the patient know her INR is low at 1.5. Please confirm that she takes 4 mg of Coumadin on Mondays and takes 3 mg on Tuesday through Sunday. If this is correct she should take 4 mg on Mondays, Thursdays, and Saturdays, and takes 3 mg Tuesday, Wednesday, Friday, and Sunday. If that is not correct please let me know. We should recheck in 2 weeks. Thanks.

## 2017-03-04 NOTE — Telephone Encounter (Signed)
Patient states she was taking 4 mg Monday and 3mg  on rest of days. Informed patient to start taking 4 mg mon, thur, Saturday. 3mg  rest of days.

## 2017-03-09 DIAGNOSIS — R262 Difficulty in walking, not elsewhere classified: Secondary | ICD-10-CM | POA: Diagnosis not present

## 2017-03-09 DIAGNOSIS — M542 Cervicalgia: Secondary | ICD-10-CM | POA: Diagnosis not present

## 2017-03-09 DIAGNOSIS — M6281 Muscle weakness (generalized): Secondary | ICD-10-CM | POA: Diagnosis not present

## 2017-03-11 ENCOUNTER — Ambulatory Visit: Payer: Medicare Other

## 2017-03-15 ENCOUNTER — Other Ambulatory Visit: Payer: Self-pay | Admitting: Family Medicine

## 2017-03-15 MED ORDER — WARFARIN SODIUM 1 MG PO TABS: ORAL_TABLET | ORAL | 3 refills | Status: DC

## 2017-03-15 MED ORDER — WARFARIN SODIUM 3 MG PO TABS: ORAL_TABLET | ORAL | 1 refills | Status: DC

## 2017-03-15 MED ORDER — ALPRAZOLAM 0.5 MG PO TABS: ORAL_TABLET | ORAL | 1 refills | Status: DC

## 2017-03-15 NOTE — Telephone Encounter (Signed)
Refill sent to pharmacy. Please fax the Xanax. Please confirm with the patient that she knows what her Coumadin dosing is. She should be taking 4 mg of Coumadin Monday, Thursday, and Saturday. She should be taking 3 mg of Coumadin Tuesday, Wednesday, Friday, and Sunday. It is okay for her to use the 1 mg tablets to follow the above recommendations until she gets her medication from her mail order.

## 2017-03-15 NOTE — Telephone Encounter (Signed)
Pt changed her mind that she wants her medication send to [facility] - [address], [place].   Pt states that she has 4 bottles of the 1 mg coumadin, can she just take 3 of the 1mg  until she gets the new rx? Please advise, thank you!  Call pt @ [phone]

## 2017-03-15 NOTE — Telephone Encounter (Signed)
Last OV 02/16/17 last filled  Alprazolam 05/27/16 90 1rf Warfarin 06/01/16 90 1rf

## 2017-03-15 NOTE — Telephone Encounter (Signed)
Faxed, patient notified and is taking the correct dose

## 2017-03-15 NOTE — Telephone Encounter (Signed)
Please advise 

## 2017-03-15 NOTE — Telephone Encounter (Signed)
Pt called requesting a refill on her warfarin (COUMADIN) 3 MG tablet, and ALPRAZolam (XANAX) 0.5 MG tablet. Please advise, thank you!  [place], [place]

## 2017-03-17 DIAGNOSIS — Z7901 Long term (current) use of anticoagulants: Secondary | ICD-10-CM | POA: Diagnosis not present

## 2017-03-17 LAB — POCT INR: INR: 3.9 — AB (ref 0.9–1.1)

## 2017-03-17 LAB — PROTIME-INR: Protime: 37.3 — AB (ref 10.0–13.8)

## 2017-03-19 ENCOUNTER — Ambulatory Visit: Payer: Medicare Other

## 2017-03-19 ENCOUNTER — Encounter: Payer: Self-pay | Admitting: Family Medicine

## 2017-03-19 ENCOUNTER — Ambulatory Visit (INDEPENDENT_AMBULATORY_CARE_PROVIDER_SITE_OTHER): Payer: Medicare Other | Admitting: Family Medicine

## 2017-03-19 VITALS — BP 150/80 | HR 58 | Temp 97.9°F | Wt 136.8 lb

## 2017-03-19 DIAGNOSIS — Z86711 Personal history of pulmonary embolism: Secondary | ICD-10-CM

## 2017-03-19 DIAGNOSIS — R413 Other amnesia: Secondary | ICD-10-CM

## 2017-03-19 DIAGNOSIS — Z780 Asymptomatic menopausal state: Secondary | ICD-10-CM | POA: Diagnosis not present

## 2017-03-19 DIAGNOSIS — Z95828 Presence of other vascular implants and grafts: Secondary | ICD-10-CM

## 2017-03-19 DIAGNOSIS — M8580 Other specified disorders of bone density and structure, unspecified site: Secondary | ICD-10-CM | POA: Diagnosis not present

## 2017-03-19 MED ORDER — BACLOFEN 10 MG PO TABS: 5.0000 mg | ORAL_TABLET | Freq: Three times a day (TID) | ORAL | 0 refills | Status: DC | PRN

## 2017-03-19 NOTE — Assessment & Plan Note (Signed)
Currently on warfarin. She has an IVC filter in place. She would like to follow up with vascular surgery for this. We'll place her referral.

## 2017-03-19 NOTE — Patient Instructions (Signed)
Nice to see you. We will get you set up for bone density scan. We'll get you to see vein and vascular surgery.

## 2017-03-19 NOTE — Progress Notes (Signed)
  [name] Rumps, MD Phone: [phone]  [name] is a 83 y.o. female who presents today for follow-up.  Patient has a history of PE. She has an IVC filter in place and was previously followed by vascular surgery for this. She reports it has been sometime since she has seen them. She would like to see them again. She continues to take warfarin. She reports she had her INR checked recently though we have not received the results. She notes no bleeding issues.  Osteopenia: She was previously on Prolia. She has not been on this in a number of years. She is not had a bone density scan in the last 2 years. She does take vitamin D though gets her calcium from diet.  Patient reports some memory issues with forgetting things that she thinks she should remember. She'll occasionally get to another room and forget what she went to get. She notes no issues with her ADLs or IADLs and she cooks, cleans, and drives herself and shops for herself. Her son does occasionally help with her finances though she describes this as him butting into her finances. She has not gotten lost driving around. MMSE 23/30.  Patient also notes her neck is improving though occasionally she does still require the baclofen. She requests a refill.  PMH: Former smoker   ROS see history of present illness  Objective  Physical Exam Vitals:   03/19/17 1406  BP: (!) 150/80  Pulse: (!) 58  Temp: 97.9 F (36.6 C)  SpO2: 98%    BP Readings from Last 3 Encounters:  03/19/17 (!) 150/80  02/16/17 130/80  01/29/17 (!) 146/76   Wt Readings from Last 3 Encounters:  03/19/17 136 lb 12.8 oz (62.1 kg)  02/16/17 139 lb 9.6 oz (63.3 kg)  01/29/17 138 lb 12.8 oz (63 kg)    Physical Exam  Constitutional: No distress.  Cardiovascular: Normal rate, regular rhythm and normal heart sounds.   Pulmonary/Chest: Effort normal and breath sounds normal.  Musculoskeletal: She exhibits no edema.  Neurological: She is alert. Gait  normal.  Skin: Skin is warm and dry. She is not diaphoretic.     Assessment/Plan: Please see individual problem list.  History of pulmonary embolus (PE) Currently on warfarin. She has an IVC filter in place. She would like to follow up with vascular surgery for this. We'll place her referral.  Osteopenia Due for bone density testing. Order placed.  Memory loss Mild memory difficulty. MMSE with mild memory difficulties noted. No significant ADL or IADL issues. Unlikely dementia at this point. She'll monitor. If worsen she'll let us know.   Orders Placed This Encounter  Procedures  . DG Bone Density    Standing Status:   Future    Standing Expiration Date:   05/19/2018    Order Specific Question:   Reason for Exam (SYMPTOM  OR DIAGNOSIS REQUIRED)    Answer:   osteoporosis screening, post menopausal estrogen deficiency    Order Specific Question:   Preferred imaging location?    Answer:   [facility]  . Ambulatory referral to Vascular Surgery    Referral Priority:   Routine    Referral Type:   Surgical    Referral Reason:   Specialty Services Required    Requested Specialty:   Vascular Surgery    Number of Visits Requested:   1   [name] Rumps, MD [place]

## 2017-03-19 NOTE — Assessment & Plan Note (Signed)
Due for bone density testing. Order placed.

## 2017-03-19 NOTE — Assessment & Plan Note (Signed)
Mild memory difficulty. MMSE with mild memory difficulties noted. No significant ADL or IADL issues. Unlikely dementia at this point. She'll monitor. If worsen she'll let us know.

## 2017-03-22 ENCOUNTER — Telehealth: Payer: Self-pay | Admitting: *Deleted

## 2017-03-22 ENCOUNTER — Encounter: Payer: Self-pay | Admitting: Family Medicine

## 2017-03-22 NOTE — Telephone Encounter (Signed)
Placed on your desk. 

## 2017-03-22 NOTE — Telephone Encounter (Signed)
We have not received inr results for this patient, please call [facility] to see if they received inr labs from 03/17/17

## 2017-03-22 NOTE — Telephone Encounter (Signed)
Patient notified and verbalized understanding. 

## 2017-03-22 NOTE — Telephone Encounter (Signed)
If the lab was not drawn in our lab, you will need to call. It doesn't look like it was drawn here.   [phone] (Our Acct# [id number])

## 2017-03-22 NOTE — Telephone Encounter (Signed)
Pt stated that her coumadin refills were in her mailbox. As of 3:30pm this afternoon she will continue her scheduled doses , same as three weeks ago- per pt.  Pt contact [phone]- a message can be left on voicemail -if needed

## 2017-03-22 NOTE — Telephone Encounter (Signed)
Please let the patient know her INR is elevated at 3.9. She needs to change her regimen as follows: 4 mg of Coumadin on Saturday and 3 mg of Coumadin Sunday through Friday. We will recheck in 2 weeks.

## 2017-03-22 NOTE — Telephone Encounter (Signed)
Pt requested a call from [place] only. Pt stated that she will not take her dose of coumadin until she hears from Dr. [name]. Pt feels as if her script and direction is wrong and confusing . Pt contact [phone]

## 2017-03-23 DIAGNOSIS — M5030 Other cervical disc degeneration, unspecified cervical region: Secondary | ICD-10-CM | POA: Diagnosis not present

## 2017-03-23 DIAGNOSIS — M542 Cervicalgia: Secondary | ICD-10-CM | POA: Diagnosis not present

## 2017-04-01 DIAGNOSIS — Z7901 Long term (current) use of anticoagulants: Secondary | ICD-10-CM | POA: Diagnosis not present

## 2017-04-01 LAB — POCT INR: INR: 3.4 — AB (ref 0.9–1.1)

## 2017-04-01 LAB — PROTIME-INR: Protime: 33.2 — AB (ref 10.0–13.8)

## 2017-04-02 ENCOUNTER — Encounter: Payer: Self-pay | Admitting: Family Medicine

## 2017-04-02 ENCOUNTER — Telehealth: Payer: Self-pay

## 2017-04-02 NOTE — Telephone Encounter (Signed)
Pt notified of new coumadin dose and is aware she needs to have it rechecked in a week

## 2017-04-02 NOTE — Telephone Encounter (Signed)
Please advise 

## 2017-04-02 NOTE — Telephone Encounter (Signed)
Patient is already aware.

## 2017-04-02 NOTE — Telephone Encounter (Signed)
Left message to return call to notify patient we did receive inr and to start taking 2 mg Saturday and 3 mg on all other days. Recheck in 1 week

## 2017-04-12 ENCOUNTER — Encounter: Payer: Self-pay | Admitting: Family Medicine

## 2017-04-12 DIAGNOSIS — Z7901 Long term (current) use of anticoagulants: Secondary | ICD-10-CM | POA: Diagnosis not present

## 2017-04-13 ENCOUNTER — Telehealth: Payer: Self-pay | Admitting: *Deleted

## 2017-04-13 ENCOUNTER — Encounter: Payer: Self-pay | Admitting: Family Medicine

## 2017-04-13 LAB — PROTIME-INR
Protime: 2.3 — AB (ref 10.0–13.8)
Protime: 22.9 — AB (ref 10.0–13.8)

## 2017-04-13 LAB — POCT INR: INR: 2.3 — AB (ref 0.9–1.1)

## 2017-04-13 NOTE — Telephone Encounter (Signed)
[name] has reported pt INR of 2.3

## 2017-04-13 NOTE — Telephone Encounter (Signed)
Pt requested a Rx for neck pain , arthritis . Pt would like [facility]. Pt would also like to know her next months plan for her coumadin .  Pt contact [phone]

## 2017-04-13 NOTE — Telephone Encounter (Signed)
Please advise 

## 2017-04-14 MED ORDER — BACLOFEN 10 MG PO TABS: 5.0000 mg | ORAL_TABLET | Freq: Three times a day (TID) | ORAL | 0 refills | Status: DC | PRN

## 2017-04-14 NOTE — Telephone Encounter (Signed)
I can send in the baclofen that she has been on previously. This is a muscle relaxer.

## 2017-04-14 NOTE — Telephone Encounter (Signed)
Patient notified

## 2017-04-14 NOTE — Telephone Encounter (Signed)
Verbal discussion with CMA per below.

## 2017-04-14 NOTE — Telephone Encounter (Signed)
Patient notified per Dr.Sonnenberg to continue current dose and repeat inr in 1 month

## 2017-04-14 NOTE — Telephone Encounter (Signed)
Patient states the areas have changed, she states some days when the weather is bad theres more aches and pains in the neck. Patient would like something to takes as needed for days when she is aching. Patient states she does not have any muscle relaxer.She states she does not want to be dependant on anything.

## 2017-04-14 NOTE — Telephone Encounter (Signed)
At our last visit the patient reported that her neck pain was improving. Please see if anything has changed and see if she has tried the muscle relaxer.

## 2017-04-20 DIAGNOSIS — Z23 Encounter for immunization: Secondary | ICD-10-CM | POA: Diagnosis not present

## 2017-04-21 ENCOUNTER — Ambulatory Visit (INDEPENDENT_AMBULATORY_CARE_PROVIDER_SITE_OTHER): Payer: Medicare Other

## 2017-04-21 VITALS — BP 132/78 | HR 75 | Temp 98.3°F | Resp 15 | Ht 59.0 in | Wt 136.4 lb

## 2017-04-21 DIAGNOSIS — Z0000 Encounter for general adult medical examination without abnormal findings: Secondary | ICD-10-CM

## 2017-04-21 NOTE — Progress Notes (Signed)
Subjective:   [name] is a 83 y.o. female who presents for Medicare Annual (Subsequent) preventive examination.  Review of Systems:  No ROS.  Medicare Wellness Visit. Additional risk factors are reflected in the social history.  Cardiac Risk Factors include: advanced age (>39mn, >>79women);hypertension     Objective:     Vitals: BP 132/78 (BP Location: Left Arm, Patient Position: Sitting, Cuff Size: Normal)   Pulse 75   Temp 98.3 F (36.8 C) (Oral)   Resp 15   Ht 4' 11"  (1.499 m)   Wt 136 lb 6.4 oz (61.9 kg)   SpO2 97%   BMI 27.55 kg/m   Body mass index is 27.55 kg/m.   Tobacco History  Smoking Status  . Former Smoker  . Quit date: 05/13/1999  Smokeless Tobacco  . Never Used     Counseling given: Not Answered   Past Medical History:  Diagnosis Date  . Allergy   . [place]   T1c, Nx carcinoma left breast, ER 90%, PR 90%, HER-2/neu not over expressed.  . Hypertension   . Insomnia   . Osteoporosis    Prolia 10/2012  . [place]  . S/P IVC filter 2009   Past Surgical History:  Procedure Laterality Date  . BLADDER SURGERY    . BREAST SURGERY Left 2013   mastectomy  . CHOLECYSTECTOMY    . HERNIA REPAIR    . MASTECTOMY Left 2013   BREAST CA  . REPLACEMENT TOTAL KNEE     right  . TONSILLECTOMY     Family History  Problem Relation Age of Onset  . COPD Mother   . Stroke Father   . Cancer Father        breast  . Breast cancer Maternal Aunt    History  Sexual Activity  . Sexual activity: Not on file    Outpatient Encounter Prescriptions as of 04/21/2017  Medication Sig  . ALPRAZolam (XANAX) 0.5 MG tablet TAKE 1 TABLET EVERY NIGHT AT BEDTIME AS NEEDED FOR SLEEP OR ANXIETY  . baclofen (LIORESAL) 10 MG tablet Take 0.5 tablets (5 mg total) by mouth 3 (three) times daily as needed for muscle spasms.  . Biotin 10 MG TABS Take by mouth.  . bisoprolol-hydrochlorothiazide (ZIAC) 5-6.25 MG tablet Take 1 tablet  by mouth daily.  . Cholecalciferol (VITAMIN D PO) Take 2,000 mg by mouth daily.  . fluticasone (FLONASE) 50 MCG/ACT nasal spray Place 2 sprays into both nostrils daily.  .Marland Kitchenletrozole (FEMARA) 2.5 MG tablet Take 1 tablet (2.5 mg total) by mouth daily.  .Marland Kitchenwarfarin (COUMADIN) 1 MG tablet Take 1 mg by mouth on Monday, Thursday, and Saturday  . warfarin (COUMADIN) 3 MG tablet TAKE 1 TABLET DAILY   No facility-administered encounter medications on file as of 04/21/2017.     Activities of Daily Living In your present state of health, do you have any difficulty performing the following activities: 04/21/2017  Hearing? Y  Comment Hearing aids  Vision? N  Difficulty concentrating or making decisions? Y  Comment Difficulty remembering at times.  Walking or climbing stairs? Y  Comment Unsteady gait. No falls.  Dressing or bathing? N  Doing errands, shopping? N  Preparing Food and eating ? N  Using the Toilet? N  In the past six months, have you accidently leaked urine? N  Do you have problems with loss of bowel control? N  Managing your Medications? N  Managing your Finances? N  Housekeeping  or managing your Housekeeping? N  Some recent data might be hidden    Patient Care Team: [name], MD as PCP - General (Family Medicine) Bary Castilla, Forest Gleason, MD (General Surgery) Jackolyn Confer, MD (Internal Medicine)    Assessment:    This is a routine wellness examination for [name]. The goal of the wellness visit is to assist the patient how to close the gaps in care and create a preventative care plan for the patient.   The roster of all physicians providing medical care to patient is listed in the Snapshot section of the chart.  Taking calcium VIT D as appropriate/Osteoporosis risk reviewed.  Next Dexa Scan scheduled 05/2017.  Safety issues reviewed; lives at the [place]. Smoke and carbon monoxide detectors in the home. No firearms in the home.  Wears seatbelts when  driving or riding with others. Patient does wear sunscreen or protective clothing when in direct sunlight. No violence in the home.  Patient is alert, normal appearance, oriented to person/place/and time.  Correctly identified the president of the [place], recall of 2/3 words, and performing simple calculations. Displays appropriate judgement and can read correct time from watch face.   No new identified risk were noted.  No failures at ADL's or IADL's.    BMI- discussed the importance of a healthy diet, water intake and the benefits of aerobic exercise. Educational material provided.   24 hour diet recall: Breakfast: stuffed breakfast roll Lunch: none Dinner: meatloaf, mashed potatoes, fruit Snack: chips and dip Daily fluid intake: 2-3 cups of caffeine, 1 cups of water.  Dental- every 6 months.  Dr. Lynelle Smoke.  Eye- Visual acuity not assessed per patient preference since they have regular follow up with the ophthalmologist.  Wears corrective lenses.  Sleep patterns- Sleeps 4-5 hours at night. She does not nap during the day.  Health maintenance gaps- closed.  Patient Concerns: None at this time. Follow up with PCP as needed.  Exercise Activities and Dietary recommendations Current Exercise Habits: Structured exercise class, Time (Minutes): 60, Frequency (Times/Week): 2, Weekly Exercise (Minutes/Week): 120, Intensity: Mild  Goals    . Increase water intake      Fall Risk Fall Risk  04/21/2017 01/29/2017 09/11/2016 07/03/2016 08/31/2014  Falls in the past year? No No No No No  Comment - - - [place]i Telephone Survey: data to providers prior to load -   Depression Screen PHQ 2/9 Scores 04/21/2017 01/29/2017 09/11/2016 08/31/2014  PHQ - 2 Score 0 0 0 0     Cognitive Function MMSE - Mini Mental State Exam 03/19/2017  Orientation to time 4  Orientation to Place 5  Registration 3  Attention/ Calculation 1  Recall 1  Language- name 2 objects 2  Language- repeat 1  Language- follow 3 step  command 3  Language- read & follow direction 1  Write a sentence 1  Copy design 1  Total score 23     6CIT Screen 04/21/2017  What Year? 0 points  What month? 0 points  What time? 0 points  Count back from 20 0 points  Months in reverse 0 points    Immunization History  Administered Date(s) Administered  . Influenza Split 04/09/2016  . Influenza Whole 04/13/2011  . Influenza-Unspecified 05/07/2012, 04/19/2013, 04/28/2014, 03/27/2015, 04/12/2017  . Pneumococcal Conjugate-13 10/09/2014  . Pneumococcal Polysaccharide-23 05/12/2010  . Tdap 03/13/2011  . Zoster 05/12/2008   Screening Tests Health Maintenance  Topic Date Due  . TETANUS/TDAP  03/12/2021  . INFLUENZA VACCINE  Completed  . DEXA SCAN  Completed  . PNA vac Low Risk Adult  Completed      Plan:    End of life planning; Advance aging; Advanced directives discussed. Copy of current HCPOA/Living Will requested.    I have personally reviewed and noted the following in the patient's chart:   . Medical and social history . Use of alcohol, tobacco or illicit drugs  . Current medications and supplements . Functional ability and status . Nutritional status . Physical activity . Advanced directives . List of other physicians . Hospitalizations, surgeries, and ER visits in previous 12 months . Vitals . Screenings to include cognitive, depression, and falls . Referrals and appointments  In addition, I have reviewed and discussed with patient certain preventive protocols, quality metrics, and best practice recommendations. A written personalized care plan for preventive services as well as general preventive health recommendations were provided to patient.     Varney Biles, LPN  01/24/1600

## 2017-04-21 NOTE — Patient Instructions (Addendum)
  Ms. [name] , Thank you for taking time to come for your Medicare Wellness Visit. I appreciate your ongoing commitment to your health goals. Please review the following plan we discussed and let me know if I can assist you in the future.   Follow up with Dr. [name] as needed.    Bring a copy of your [place] and/or Living Will to be scanned into chart.  Have a great day!  These are the goals we discussed: Goals    . Increase water intake       This is a list of the screening recommended for you and due dates:  Health Maintenance  Topic Date Due  . Tetanus Vaccine  03/12/2021  . Flu Shot  Completed  . DEXA scan (bone density measurement)  Completed  . Pneumonia vaccines  Completed

## 2017-04-23 ENCOUNTER — Encounter: Payer: Self-pay | Admitting: Family Medicine

## 2017-04-26 ENCOUNTER — Encounter (INDEPENDENT_AMBULATORY_CARE_PROVIDER_SITE_OTHER): Payer: Medicare Other | Admitting: Vascular Surgery

## 2017-05-10 DIAGNOSIS — Z7901 Long term (current) use of anticoagulants: Secondary | ICD-10-CM | POA: Diagnosis not present

## 2017-05-10 LAB — POCT INR: INR: 2.3 — AB (ref <=1.1)

## 2017-05-10 LAB — PROTIME-INR: Protime: 22.7 — AB (ref 10.0–13.8)

## 2017-05-13 ENCOUNTER — Telehealth: Payer: Self-pay

## 2017-05-13 NOTE — Telephone Encounter (Signed)
Noted. INR is in the acceptable range. Please inform the patient to continue her current Coumadin regimen.

## 2017-05-13 NOTE — Telephone Encounter (Signed)
Date 05/10/17 INR 2.3 PT 22.7

## 2017-05-18 ENCOUNTER — Other Ambulatory Visit: Payer: Self-pay

## 2017-05-18 DIAGNOSIS — Z1231 Encounter for screening mammogram for malignant neoplasm of breast: Secondary | ICD-10-CM

## 2017-05-27 ENCOUNTER — Ambulatory Visit
Admission: RE | Admit: 2017-05-27 | Discharge: 2017-05-27 | Disposition: A | Discharge status: Home / Self Care | Payer: Medicare Other | Source: Ambulatory Visit | Attending: Family Medicine | Admitting: Family Medicine

## 2017-05-27 DIAGNOSIS — M8589 Other specified disorders of bone density and structure, multiple sites: Secondary | ICD-10-CM | POA: Diagnosis not present

## 2017-05-27 DIAGNOSIS — M85832 Other specified disorders of bone density and structure, left forearm: Secondary | ICD-10-CM | POA: Diagnosis not present

## 2017-05-27 DIAGNOSIS — Z780 Asymptomatic menopausal state: Secondary | ICD-10-CM

## 2017-05-27 DIAGNOSIS — M85851 Other specified disorders of bone density and structure, right thigh: Secondary | ICD-10-CM | POA: Insufficient documentation

## 2017-05-28 ENCOUNTER — Telehealth: Payer: Self-pay | Admitting: Family Medicine

## 2017-05-28 NOTE — Telephone Encounter (Signed)
Copied from [place] #[id number]. Topic: Quick Communication - See Telephone Encounter >> May 28, 2017 10:27 AM Boyd Kerbs wrote: CRM for notification. See Telephone encounter for:  05/28/17.  Wants to know what the results on bone density test. She also wants a copy of the test results to her cancer Dr. Jeannette Corpus

## 2017-05-28 NOTE — Telephone Encounter (Signed)
Please advise   Copied from [place] #[id number]. Topic: Quick Communication - See Telephone Encounter >> May 28, 2017 10:27 AM Boyd Kerbs wrote: CRM for notification. See Telephone encounter for:  05/28/17.  Wants to know what the results on bone density test. She also wants a copy of the test results to her cancer Dr. Jeannette Corpus

## 2017-05-28 NOTE — Telephone Encounter (Signed)
See result note.  

## 2017-06-01 ENCOUNTER — Other Ambulatory Visit: Payer: Self-pay | Admitting: Family Medicine

## 2017-06-01 ENCOUNTER — Telehealth: Payer: Self-pay | Admitting: *Deleted

## 2017-06-01 DIAGNOSIS — M8580 Other specified disorders of bone density and structure, unspecified site: Secondary | ICD-10-CM

## 2017-06-01 DIAGNOSIS — M810 Age-related osteoporosis without current pathological fracture: Secondary | ICD-10-CM

## 2017-06-01 DIAGNOSIS — E559 Vitamin D deficiency, unspecified: Secondary | ICD-10-CM

## 2017-06-01 NOTE — Telephone Encounter (Signed)
-----   Message from [name], MD sent at 05/31/2017  6:04 PM EST ----- Please start the process of getting prolia for the patient. She will need calcium and vitamin D testing completed prior to first injection. She should also be on calcium and vitamin D supplementation. Thanks.

## 2017-06-01 NOTE — Telephone Encounter (Signed)
Thank you :)

## 2017-06-01 NOTE — Telephone Encounter (Signed)
I have scheduled patient vitamin D level and calcium for Prolia , lab scheduled 06/14/17 will authorization for Prolia.

## 2017-06-03 ENCOUNTER — Other Ambulatory Visit
Admission: RE | Admit: 2017-06-03 | Discharge: 2017-06-03 | Disposition: A | Discharge status: Home / Self Care | Payer: Medicare Other | Source: Ambulatory Visit | Attending: Ophthalmology | Admitting: Ophthalmology

## 2017-06-03 DIAGNOSIS — H109 Unspecified conjunctivitis: Secondary | ICD-10-CM | POA: Insufficient documentation

## 2017-06-05 DIAGNOSIS — J0100 Acute maxillary sinusitis, unspecified: Secondary | ICD-10-CM | POA: Diagnosis not present

## 2017-06-06 ENCOUNTER — Other Ambulatory Visit: Payer: Self-pay | Admitting: Family Medicine

## 2017-06-07 LAB — AEROBIC CULTURE W GRAM STAIN (SUPERFICIAL SPECIMEN)

## 2017-06-07 LAB — AEROBIC CULTURE  (SUPERFICIAL SPECIMEN): Gram Stain: NONE SEEN

## 2017-06-11 DIAGNOSIS — H109 Unspecified conjunctivitis: Secondary | ICD-10-CM | POA: Diagnosis not present

## 2017-06-14 ENCOUNTER — Other Ambulatory Visit (INDEPENDENT_AMBULATORY_CARE_PROVIDER_SITE_OTHER): Payer: Medicare Other

## 2017-06-14 DIAGNOSIS — M810 Age-related osteoporosis without current pathological fracture: Secondary | ICD-10-CM | POA: Diagnosis not present

## 2017-06-14 DIAGNOSIS — E559 Vitamin D deficiency, unspecified: Secondary | ICD-10-CM | POA: Diagnosis not present

## 2017-06-14 DIAGNOSIS — Z7901 Long term (current) use of anticoagulants: Secondary | ICD-10-CM | POA: Diagnosis not present

## 2017-06-14 LAB — PROTIME-INR
INR: 4.8 ratio — ABNORMAL HIGH (ref 0.8–1.0)
Prothrombin Time: 51.5 s — ABNORMAL HIGH (ref 9.6–13.1)

## 2017-06-14 LAB — CALCIUM: Calcium: 9.4 mg/dL (ref 8.4–10.5)

## 2017-06-14 LAB — VITAMIN D 25 HYDROXY (VIT D DEFICIENCY, FRACTURES): VITD: 44.15 ng/mL (ref 30.00–100.00)

## 2017-06-16 ENCOUNTER — Other Ambulatory Visit: Payer: Self-pay

## 2017-06-16 ENCOUNTER — Other Ambulatory Visit (INDEPENDENT_AMBULATORY_CARE_PROVIDER_SITE_OTHER): Payer: Medicare Other

## 2017-06-16 DIAGNOSIS — Z7901 Long term (current) use of anticoagulants: Secondary | ICD-10-CM

## 2017-06-16 LAB — PROTIME-INR
INR: 3.1 ratio — ABNORMAL HIGH (ref 0.8–1.0)
Prothrombin Time: 33.5 s — ABNORMAL HIGH (ref 9.6–13.1)

## 2017-06-16 NOTE — Addendum Note (Signed)
Addended by: Leeanne Rio on: 06/16/2017 11:36 AM   Modules accepted: Orders

## 2017-06-23 DIAGNOSIS — Z7901 Long term (current) use of anticoagulants: Secondary | ICD-10-CM | POA: Diagnosis not present

## 2017-06-23 LAB — PROTIME-INR: Protime: 25.2 — AB (ref 10.0–13.8)

## 2017-06-23 LAB — POCT INR: INR: 2.6 — AB (ref 0.9–1.1)

## 2017-06-24 ENCOUNTER — Telehealth: Payer: Self-pay | Admitting: Family Medicine

## 2017-06-24 NOTE — Telephone Encounter (Signed)
Copied from [place]. Topic: Quick Communication - See Telephone Encounter >> Jun 24, 2017  4:54 PM Clack, Laban Emperor wrote: CRM for notification. See Telephone encounter for:  Pt states she would like the nurse to give her a call to let her know how much warfarin (COUMADIN) she is suppose to take and to go over her labs she had done at [facility].  06/24/17.

## 2017-06-25 ENCOUNTER — Telehealth: Payer: Self-pay

## 2017-06-25 ENCOUNTER — Encounter: Payer: Self-pay | Admitting: Family Medicine

## 2017-06-25 NOTE — Telephone Encounter (Signed)
Notified patient per Dr.Sonnenebrg that inr is in acceptable range 2.6. Continue current dose 2 mg Saturday and 3 mg on all other days. Recheck in 1 month, Patient repeated back and verbalized understanding.

## 2017-06-25 NOTE — Telephone Encounter (Signed)
Patient was advised.  

## 2017-06-25 NOTE — Telephone Encounter (Signed)
I believe you already spoke with her regarding this. Please see other phone note.

## 2017-06-25 NOTE — Telephone Encounter (Signed)
Please advise 

## 2017-07-02 ENCOUNTER — Ambulatory Visit: Payer: Medicare Other | Admitting: Family Medicine

## 2017-07-05 ENCOUNTER — Telehealth: Payer: Self-pay | Admitting: Family Medicine

## 2017-07-05 NOTE — Telephone Encounter (Signed)
Copied from [place]. Topic: Quick Communication - See Telephone Encounter >> Jul 05, 2017 10:10 AM Vernona Rieger wrote: CRM for notification. See Telephone encounter for:   07/05/17.  Pt states she had some blood work done and wants to know if she was eligible for the osteoporosis injection. Please call her [phone]. She said please speak loud and leave a message

## 2017-07-07 NOTE — Telephone Encounter (Signed)
Please make sure patient is taking calcium and vitamin D.  Her lab work was acceptable and she can proceed with Prolia injections. I will forward back to [place] to check on Prolia authorization as it appears this was sent to her to get authorized previously.

## 2017-07-07 NOTE — Telephone Encounter (Signed)
Please advise 

## 2017-07-08 ENCOUNTER — Ambulatory Visit
Admission: RE | Admit: 2017-07-08 | Discharge: 2017-07-08 | Disposition: A | Discharge status: Home / Self Care | Payer: Medicare Other | Source: Ambulatory Visit | Attending: General Surgery | Admitting: General Surgery

## 2017-07-08 DIAGNOSIS — Z1231 Encounter for screening mammogram for malignant neoplasm of breast: Secondary | ICD-10-CM | POA: Diagnosis not present

## 2017-07-08 DIAGNOSIS — Z9012 Acquired absence of left breast and nipple: Secondary | ICD-10-CM | POA: Diagnosis not present

## 2017-07-13 ENCOUNTER — Telehealth: Payer: Self-pay | Admitting: General Surgery

## 2017-07-13 NOTE — Telephone Encounter (Signed)
PATIENT CALLED & WOULD LIKE TO KNOW HER MAMMO RESULTS BEFORE [facility].SHE HAD THEM ON 07-08-17 & IS SCHEDULED TO SEE YOU ON 07-21-17 @ 1:45.PLEASE CALL [phone].

## 2017-07-13 NOTE — Telephone Encounter (Signed)
Mammograms look fine.

## 2017-07-14 NOTE — Telephone Encounter (Signed)
I CALLED PATIENT TO LET HER KNOW HER MAMMOGRAM WAS FINE AND DR BYRNETT STILL WANTS TO SEE HER AT  HER APPOINTMENT.

## 2017-07-21 ENCOUNTER — Encounter: Payer: Self-pay | Admitting: General Surgery

## 2017-07-21 ENCOUNTER — Ambulatory Visit (INDEPENDENT_AMBULATORY_CARE_PROVIDER_SITE_OTHER): Payer: Medicare Other | Admitting: General Surgery

## 2017-07-21 VITALS — BP 138/80 | HR 79 | Resp 14 | Ht 59.0 in | Wt 130.0 lb

## 2017-07-21 DIAGNOSIS — C50412 Malignant neoplasm of upper-outer quadrant of left female breast: Secondary | ICD-10-CM

## 2017-07-21 DIAGNOSIS — Z170 Estrogen receptor positive status [ER+]: Secondary | ICD-10-CM | POA: Diagnosis not present

## 2017-07-21 NOTE — Progress Notes (Signed)
Patient ID: [name], female   DOB: 09-27-1925, 83 y.o.   MRN: [id number]  Chief Complaint  Patient presents with  . Follow-up    HPI [name] is a 83 y.o. female who presents for a breast evaluation. The most recent mammogram was done on 07/08/17. Patient does perform regular self breast checks and gets regular mammograms done. She reports that she has no new breast problems. She recently developed a left hand tremor.      HPI  Past Medical History:  Diagnosis Date  . Allergy   . Breast cancer ([place]) 07/22/2012   T1c, Nx carcinoma left breast, ER 90%, PR 90%, HER-2/neu not over expressed.  . Hypertension   . Insomnia   . Osteoporosis    Prolia 10/2012  . Pulmonary embolus ([place]) 2009  . S/P IVC filter 2009    Past Surgical History:  Procedure Laterality Date  . BLADDER SURGERY    . BREAST SURGERY Left 2013   mastectomy  . CHOLECYSTECTOMY    . HERNIA REPAIR    . MASTECTOMY Left 2013   BREAST CA  . REPLACEMENT TOTAL KNEE     right  . TONSILLECTOMY      Family History  Problem Relation Age of Onset  . COPD Mother   . Stroke Father   . Cancer Father        breast  . Breast cancer Maternal Aunt     Social History Social History   Tobacco Use  . Smoking status: Former Smoker    Last attempt to quit: 05/13/1999    Years since quitting: 18.2  . Smokeless tobacco: Never Used  Substance Use Topics  . Alcohol use: No  . Drug use: No    Allergies  Allergen Reactions  . Erythromycin   . Sulfa Drugs Cross Reactors     rash    Current Outpatient Medications  Medication Sig Dispense Refill  . ALPRAZolam (XANAX) 0.5 MG tablet TAKE 1 TABLET EVERY NIGHT AT BEDTIME AS NEEDED FOR SLEEP OR ANXIETY 90 tablet 1  . baclofen (LIORESAL) 10 MG tablet Take 0.5 tablets (5 mg total) by mouth 3 (three) times daily as needed for muscle spasms. 45 each 0  . Biotin 10 MG TABS Take by mouth.    . bisoprolol-hydrochlorothiazide (ZIAC) 5-6.25 MG tablet Take 1 tablet by mouth  daily. 90 tablet 3  . Cholecalciferol (VITAMIN D PO) Take 2,000 mg by mouth daily.    . fluticasone (FLONASE) 50 MCG/ACT nasal spray Place 2 sprays into both nostrils daily. 16 g 6  . letrozole (FEMARA) 2.5 MG tablet Take 1 tablet (2.5 mg total) by mouth daily. 90 tablet 3  . warfarin (COUMADIN) 1 MG tablet Take 1 mg by mouth on Monday, Thursday, and Saturday 100 tablet 3  . warfarin (COUMADIN) 3 MG tablet TAKE 1 TABLET DAILY 90 tablet 1   No current facility-administered medications for this visit.     Review of Systems Review of Systems  Constitutional: Negative.   Respiratory: Negative.   Cardiovascular: Negative.     Blood pressure 138/80, pulse 79, resp. rate 14, height _0  (1.499 m), weight 130 lb (59 kg).  Physical Exam Physical Exam  Constitutional: She is oriented to person, place, and time. She appears well-developed and well-nourished.  Eyes: Conjunctivae are normal. No scleral icterus.  Neck: Neck supple.  Cardiovascular: Normal rate, regular rhythm and normal heart sounds.  Pulmonary/Chest: Effort normal and breath sounds normal. Right breast exhibits no inverted nipple,  no mass, no nipple discharge, no skin change and no tenderness.    Well healed left mastectomy site  Lymphadenopathy:    She has no cervical adenopathy.    She has no axillary adenopathy.  Neurological: She is alert and oriented to person, place, and time.  Skin: Skin is warm and dry.  Psychiatric: She has a normal mood and affect.    Data Reviewed Screening right breast mammogram dated July 08, 2017 was reviewed.  Minimal residual parenchyma tissue.  No interval change.  BI-RADS-1.  Bone density exam of May 27, 2017 shows that she is slid from normal to osteopenia over the last 2 years.  Previously treated with Prolia for osteoporosis prior to the initiation of Femara with her diagnosis of breast cancer.  Assessment    Doing well now 5 years out for management of her left breast  cancer.    Plan    5 years out from left breast cancer diagnosis. Discussed discontinuation of Femara verses continuation of antihormonal therapy if she is shown to be at increased risk for recurrent disease based on BCI testing.  She may not qualify for recurrent treatment with Prolia as her recent studies only show osteopenia.  Consideration could be given to initiation of tamoxifen therapy, and she would have a low risk for DVT as she is managed with chronic anticoagulation therapy for her atrial fibrillation and the past history of DVT in 2009 status post IVC filter.  Follow up next year with Uni lateral right screening 3-D mammogram and office visit    HPI, Physical Exam, Assessment and Plan have been scribed under the direction and in the presence of Robert Bellow, MD  Concepcion Living, LPN  I have completed the exam and reviewed the above documentation for accuracy and completeness.  I agree with the above.  Haematologist has been used and any errors in dictation or transcription are unintentional.  Hervey Ard, M.D., F.A.C.S.  Robert Bellow 07/22/2017, 8:19 PM

## 2017-07-21 NOTE — Patient Instructions (Addendum)
Uni Right Screening 3-D Mammogram with office visit next year  Continue self breast exams. Call office for any new breast issues or concerns

## 2017-07-22 ENCOUNTER — Telehealth: Payer: Self-pay | Admitting: *Deleted

## 2017-07-22 DIAGNOSIS — Z7901 Long term (current) use of anticoagulants: Secondary | ICD-10-CM | POA: Diagnosis not present

## 2017-07-22 LAB — PROTIME-INR: Protime: 21.5 — AB (ref 10.0–13.8)

## 2017-07-22 LAB — POCT INR: INR: 2.2 — AB (ref 0.9–1.1)

## 2017-07-22 NOTE — Telephone Encounter (Signed)
Order form and pathology faxed to [place] for BCI

## 2017-07-22 NOTE — Telephone Encounter (Signed)
-----   Message from Robert Bellow, MD sent at 07/22/2017  1:40 PM EST ----- BCI testing on her breast tumor.

## 2017-07-23 ENCOUNTER — Telehealth: Payer: Self-pay

## 2017-07-23 ENCOUNTER — Encounter: Payer: Self-pay | Admitting: Family Medicine

## 2017-07-23 NOTE — Telephone Encounter (Signed)
Received inr results and informed patient to continue current regimen 2 mg Saturday, 3 mg on all other days and recheck in 1 month

## 2017-07-29 ENCOUNTER — Telehealth: Payer: Self-pay | Admitting: General Surgery

## 2017-07-29 NOTE — Telephone Encounter (Signed)
I gave him patients Medicare # to file breast ca index that Dr Bary Castilla ordered.

## 2017-08-04 NOTE — Telephone Encounter (Signed)
Called pt to verify with pt about calcium and Vitamin D. Pt states she is still taking Vitamin D but is not taking any calcium. Notified her labs were acceptable

## 2017-08-06 DIAGNOSIS — C50412 Malignant neoplasm of upper-outer quadrant of left female breast: Secondary | ICD-10-CM | POA: Diagnosis not present

## 2017-08-07 NOTE — Telephone Encounter (Signed)
Patient should start on over the counter calcium supplementation 600 mg daily. Thanks.

## 2017-08-09 ENCOUNTER — Encounter: Payer: Self-pay | Admitting: General Surgery

## 2017-08-09 NOTE — Telephone Encounter (Signed)
Patient notified, patient would like to know the status of the prolia

## 2017-08-10 ENCOUNTER — Telehealth: Payer: Self-pay | Admitting: *Deleted

## 2017-08-10 NOTE — Telephone Encounter (Signed)
Notified patient as instructed, patient pleased. Discussed follow-up appointments, patient agrees  

## 2017-08-10 NOTE — Telephone Encounter (Signed)
-----   Message from Robert Bellow, MD sent at 08/09/2017  1:58 PM EST ----- Please notify the patient that the testing came back to suggesting she would benefit from continued use of an anti-estrogen.  She may want to come in to review her options. Thanks.

## 2017-08-11 DIAGNOSIS — N3000 Acute cystitis without hematuria: Secondary | ICD-10-CM | POA: Diagnosis not present

## 2017-08-11 DIAGNOSIS — R300 Dysuria: Secondary | ICD-10-CM | POA: Diagnosis not present

## 2017-08-23 NOTE — Telephone Encounter (Signed)
Still awaiting approval from insurance

## 2017-08-26 DIAGNOSIS — Z7901 Long term (current) use of anticoagulants: Secondary | ICD-10-CM | POA: Diagnosis not present

## 2017-08-27 ENCOUNTER — Encounter: Payer: Self-pay | Admitting: Family Medicine

## 2017-08-27 ENCOUNTER — Telehealth: Payer: Self-pay | Admitting: *Deleted

## 2017-08-27 LAB — PROTIME-INR: Protime: 19.1 — AB (ref 10.0–13.8)

## 2017-08-27 LAB — POCT INR: INR: 1.9 — AB (ref 0.9–1.1)

## 2017-08-27 NOTE — Telephone Encounter (Signed)
I spoke with [name] today to discuss recent PT/INR. Pt states that she was currently taking 2mg  on Saturday & 3mg  all other days.  Per [name], pt has been notified to change dosing to:  4mg  on Saturday & continue 3mg  all other days.  Pt also aware to recheck PT/INR in 2 weeks

## 2017-08-27 NOTE — Progress Notes (Unsigned)
08/27/17 

## 2017-08-28 NOTE — Telephone Encounter (Signed)
INR was low at 1.9. Increase in dose by 10% to reach dosing of 4 mg on Saturday and 3 mg the rest of the week.

## 2017-09-02 ENCOUNTER — Telehealth: Payer: Self-pay | Admitting: Family Medicine

## 2017-09-02 NOTE — Telephone Encounter (Signed)
Pt calling back for update if the med has been called into the pharmacy.  Pt states she has taken imodium and that has not helped.  Pt states she has to drive to the pharmacy and doesn't like to drive in traffice too late.  [place], [place] [phone] (Phone) [phone] (Fax)

## 2017-09-02 NOTE — Telephone Encounter (Signed)
Please advise 

## 2017-09-02 NOTE — Telephone Encounter (Signed)
Called and spoke with the patient regarding her symptoms. I apologized for the delay in getting back to her.  She was upset that she did not hear back right away this morning.  She stated she was just calling to request a prescription medication.  She stated she should be lucky that she was not lying on the floor dying.  She notes she has just been having diarrhea.  She notes no other symptoms.  No fevers.  She has been in contact with others with diarrhea at her living facility.  Also thinks it may be related to her calcium supplement.  She has stopped that.  She has been taking Imodium which has not been helpful.  She notes she just wants to get out of her apartment.  She has been staying well-hydrated and urinating normally.  She notes her diarrhea has not been worsening.  I discussed with her that in general when somebody has diarrhea I do not recommend taking a medication to stop the diarrhea as this can cause issues with retaining the possible virus or bacteria that is leading to the diarrhea that this can cause significantly worse issues.  I do not recommend a trial of Lomotil for this acute issue of diarrhea.  I also do not recommend that she continue the Imodium.  I recommend that she stay well-hydrated and monitor her symptoms and if not improving be evaluated.  I discussed that if she does not start to improve with time coming in to see [place] for an evaluation.  She was upset with this recommendation and hung the phone up on me.

## 2017-09-02 NOTE — Telephone Encounter (Signed)
Reason for call: diarrhea  Symptoms: diarrhea, no blood in stool, loose stool , no abdominal pain, noticed  after starting  calcium 600 mg ? or it maybe a virus  Takes after eating , no fever, , no vomiting  neighbor had diarrhea, tying to pushing fluids , still urinating 4-5 times a day Duration x2 days  Medications: tried Imodium OTC  Last seen for this problem: Seen by: Previously been on in the past Lomotil in the past

## 2017-09-02 NOTE — Telephone Encounter (Signed)
Copied from [place]. Topic: Quick Communication - See Telephone Encounter >> Sep 02, 2017  9:52 AM Hewitt Shorts wrote: CRM for notification. See Telephone encounter for: pt would like lomotil ? Be called in for diarrhea she states it has been two days now and would like to have it called in please let the pt know when she is able to go and get the mediation  Best number [phone]  09/02/17.

## 2017-09-03 ENCOUNTER — Ambulatory Visit (INDEPENDENT_AMBULATORY_CARE_PROVIDER_SITE_OTHER): Payer: Medicare Other | Admitting: Internal Medicine

## 2017-09-03 ENCOUNTER — Encounter: Payer: Self-pay | Admitting: Internal Medicine

## 2017-09-03 VITALS — BP 120/80 | HR 107 | Temp 98.0°F | Ht 59.0 in | Wt 127.8 lb

## 2017-09-03 DIAGNOSIS — Z86711 Personal history of pulmonary embolism: Secondary | ICD-10-CM

## 2017-09-03 DIAGNOSIS — R197 Diarrhea, unspecified: Secondary | ICD-10-CM

## 2017-09-03 DIAGNOSIS — R590 Localized enlarged lymph nodes: Secondary | ICD-10-CM

## 2017-09-03 DIAGNOSIS — R9389 Abnormal findings on diagnostic imaging of other specified body structures: Secondary | ICD-10-CM | POA: Diagnosis not present

## 2017-09-03 DIAGNOSIS — Z853 Personal history of malignant neoplasm of breast: Secondary | ICD-10-CM | POA: Diagnosis not present

## 2017-09-03 DIAGNOSIS — R634 Abnormal weight loss: Secondary | ICD-10-CM | POA: Diagnosis not present

## 2017-09-03 NOTE — Progress Notes (Signed)
Pre visit review using our clinic review tool, if applicable. No additional management support is needed unless otherwise documented below in the visit note. 

## 2017-09-03 NOTE — Patient Instructions (Addendum)
Bananas, rice, applesauce, toast, crackers  Low sugar gatorade or pedialyte  Try peptobismol for diarrhea  We will try to get stool sample bring back Monday  Will schedule CT chest/ab/pelvis   Food Choices to Help Relieve Diarrhea, Adult When you have diarrhea, the foods you eat and your eating habits are very important. Choosing the right foods and drinks can help:  Relieve diarrhea.  Replace lost fluids and nutrients.  Prevent dehydration.  What general guidelines should I follow? Relieving diarrhea  Choose foods with less than 2 g or .07 oz. of fiber per serving.  Limit fats to less than 8 tsp (38 g or 1.34 oz.) a day.  Avoid the following: ? Foods and beverages sweetened with high-fructose corn syrup, honey, or sugar alcohols such as xylitol, sorbitol, and mannitol. ? Foods that contain a lot of fat or sugar. ? Fried, greasy, or spicy foods. ? High-fiber grains, breads, and cereals. ? Raw fruits and vegetables.  Eat foods that are rich in probiotics. These foods include dairy products such as yogurt and fermented milk products. They help increase healthy bacteria in the stomach and intestines (gastrointestinal tract, or GI tract).  If you have lactose intolerance, avoid dairy products. These may make your diarrhea worse.  Take medicine to help stop diarrhea (antidiarrheal medicine) only as told by your health care provider. Replacing nutrients  Eat small meals or snacks every 3-4 hours.  Eat bland foods, such as white rice, toast, or baked potato, until your diarrhea starts to get better. Gradually reintroduce nutrient-rich foods as tolerated or as told by your health care provider. This includes: ? Well-cooked protein foods. ? Peeled, seeded, and soft-cooked fruits and vegetables. ? Low-fat dairy products.  Take vitamin and mineral supplements as told by your health care provider. Preventing dehydration   Start by sipping water or a special solution to prevent  dehydration (oral rehydration solution, ORS). Urine that is clear or pale yellow means that you are getting enough fluid.  Try to drink at least 8-10 cups of fluid each day to help replace lost fluids.  You may add other liquids in addition to water, such as clear juice or decaffeinated sports drinks, as tolerated or as told by your health care provider.  Avoid drinks with caffeine, such as coffee, tea, or soft drinks.  Avoid alcohol. What foods are recommended? The items listed may not be a complete list. Talk with your health care provider about what dietary choices are best for you. Grains White rice. White, [place], or pita breads (fresh or toasted), including plain rolls, buns, or bagels. White pasta. Saltine, soda, or graham crackers. Pretzels. Low-fiber cereal. Cooked cereals made with water (such as cornmeal, farina, or cream cereals). Plain muffins. Matzo. Melba toast. Zwieback. Vegetables Potatoes (without the skin). Most well-cooked and canned vegetables without skins or seeds. Tender lettuce. Fruits Apple sauce. Fruits canned in juice. Cooked apricots, cherries, grapefruit, peaches, pears, or plums. Fresh bananas and cantaloupe. Meats and other protein foods Baked or boiled chicken. Eggs. Tofu. Fish. Seafood. Smooth nut butters. Ground or well-cooked tender beef, ham, veal, lamb, pork, or poultry. Dairy Plain yogurt, kefir, and unsweetened liquid yogurt. Lactose-free milk, buttermilk, skim milk, or soy milk. Low-fat or nonfat hard cheese. Beverages Water. Low-calorie sports drinks. Fruit juices without pulp. Strained tomato and vegetable juices. Decaffeinated teas. Sugar-free beverages not sweetened with sugar alcohols. Oral rehydration solutions, if approved by your health care provider. Seasoning and other foods Bouillon, broth, or soups made from recommended  foods. What foods are not recommended? The items listed may not be a complete list. Talk with your health care provider  about what dietary choices are best for you. Grains Whole grain, whole wheat, bran, or rye breads, rolls, pastas, and crackers. Wild or brown rice. Whole grain or bran cereals. Barley. Oats and oatmeal. Corn tortillas or taco shells. Granola. Popcorn. Vegetables Raw vegetables. Fried vegetables. Cabbage, broccoli, Brussels sprouts, artichokes, baked beans, beet greens, corn, kale, legumes, peas, sweet potatoes, and yams. Potato skins. Cooked spinach and cabbage. Fruits Dried fruit, including raisins and dates. Raw fruits. Stewed or dried prunes. Canned fruits with syrup. Meat and other protein foods Fried or fatty meats. Deli meats. Chunky nut butters. Nuts and seeds. Beans and lentils. Berniece Salines. Hot dogs. Sausage. Dairy High-fat cheeses. Whole milk, chocolate milk, and beverages made with milk, such as milk shakes. Half-and-half. Cream. sour cream. Ice cream. Beverages Caffeinated beverages (such as coffee, tea, soda, or energy drinks). Alcoholic beverages. Fruit juices with pulp. Prune juice. Soft drinks sweetened with high-fructose corn syrup or sugar alcohols. High-calorie sports drinks. Fats and oils Butter. Cream sauces. Margarine. Salad oils. Plain salad dressings. Olives. Avocados. Mayonnaise. Sweets and desserts Sweet rolls, doughnuts, and sweet breads. Sugar-free desserts sweetened with sugar alcohols such as xylitol and sorbitol. Seasoning and other foods Honey. Hot sauce. Chili powder. Gravy. Cream-based or milk-based soups. Pancakes and waffles. Summary  When you have diarrhea, the foods you eat and your eating habits are very important.  Make sure you get at least 8-10 cups of fluid each day, or enough to keep your urine clear or pale yellow.  Eat bland foods and gradually reintroduce healthy, nutrient-rich foods as tolerated, or as told by your health care provider.  Avoid high-fiber, fried, greasy, or spicy foods. This information is not intended to replace advice given to  you by your health care provider. Make sure you discuss any questions you have with your health care provider. Document Released: 10/03/2003 Document Revised: 07/10/2016 Document Reviewed: 07/10/2016 Elsevier Interactive Patient Education  2018 [employer] Diet A bland diet consists of foods that do not have a lot of fat or fiber. Foods without fat or fiber are easier for the body to digest. They are also less likely to irritate your mouth, throat, stomach, and other parts of your gastrointestinal tract. A bland diet is sometimes called a BRAT diet. What is my plan? Your health care provider or dietitian may recommend specific changes to your diet to prevent and treat your symptoms, such as:  Eating small meals often.  Cooking food until it is soft enough to chew easily.  Chewing your food well.  Drinking fluids slowly.  Not eating foods that are very spicy, sour, or fatty.  Not eating citrus fruits, such as oranges and grapefruit.  What do I need to know about this diet?  Eat a variety of foods from the bland diet food list.  Do not follow a bland diet longer than you have to.  Ask your health care provider whether you should take vitamins. What foods can I eat? Grains  Hot cereals, such as cream of wheat. Bread, crackers, or tortillas made from refined white flour. Rice. Vegetables Canned or cooked vegetables. Mashed or boiled potatoes. Fruits Bananas. Applesauce. Other types of cooked or canned fruit with the skin and seeds removed, such as canned peaches or pears. Meats and Other Protein Sources Scrambled eggs. Creamy peanut butter or other nut butters. Lean, well-cooked meats, such  as chicken or fish. Tofu. Soups or broths. Dairy Low-fat dairy products, such as milk, cottage cheese, or yogurt. Beverages Water. Herbal tea. Apple juice. Sweets and Desserts Pudding. Custard. Fruit gelatin. Ice cream. Fats and Oils Mild salad dressings. Canola or olive  oil. The items listed above may not be a complete list of allowed foods or beverages. Contact your dietitian for more options. What foods are not recommended? Foods and ingredients that are often not recommended include:  Spicy foods, such as hot sauce or salsa.  Fried foods.  Sour foods, such as pickled or fermented foods.  Raw vegetables or fruits, especially citrus or berries.  Caffeinated drinks.  Alcohol.  Strongly flavored seasonings or condiments.  The items listed above may not be a complete list of foods and beverages that are not allowed. Contact your dietitian for more information. This information is not intended to replace advice given to you by your health care provider. Make sure you discuss any questions you have with your health care provider. Document Released: 11/04/2015 Document Revised: 12/19/2015 Document Reviewed: 07/25/2014 Elsevier Interactive Patient Education  2018 [employer].   Constipation, Adult Constipation is when a person has fewer bowel movements in a week than normal, has difficulty having a bowel movement, or has stools that are dry, hard, or larger than normal. Constipation may be caused by an underlying condition. It may become worse with age if a person takes certain medicines and does not take in enough fluids. Follow these instructions at home: Eating and drinking   Eat foods that have a lot of fiber, such as fresh fruits and vegetables, whole grains, and beans.  Limit foods that are high in fat, low in fiber, or overly processed, such as french fries, hamburgers, cookies, candies, and soda.  Drink enough fluid to keep your urine clear or pale yellow. General instructions  Exercise regularly or as told by your health care provider.  Go to the restroom when you have the urge to go. Do not hold it in.  Take over-the-counter and prescription medicines only as told by your health care provider. These include any fiber  supplements.  Practice pelvic floor retraining exercises, such as deep breathing while relaxing the lower abdomen and pelvic floor relaxation during bowel movements.  Watch your condition for any changes.  Keep all follow-up visits as told by your health care provider. This is important. Contact a health care provider if:  You have pain that gets worse.  You have a fever.  You do not have a bowel movement after 4 days.  You vomit.  You are not hungry.  You lose weight.  You are bleeding from the anus.  You have thin, pencil-like stools. Get help right away if:  You have a fever and your symptoms suddenly get worse.  You leak stool or have blood in your stool.  Your abdomen is bloated.  You have severe pain in your abdomen.  You feel dizzy or you faint. This information is not intended to replace advice given to you by your health care provider. Make sure you discuss any questions you have with your health care provider. Document Released: 04/10/2004 Document Revised: 01/31/2016 Document Reviewed: 01/01/2016 Elsevier Interactive Patient Education  2018 [employer].   Diarrhea, Adult Diarrhea is frequent loose and watery bowel movements. Diarrhea can make you feel weak and cause you to become dehydrated. Dehydration can make you tired and thirsty, cause you to have a dry mouth, and decrease how often you  urinate. Diarrhea typically lasts 2-3 days. However, it can last longer if it is a sign of something more serious. It is important to treat your diarrhea as told by your health care provider. Follow these instructions at home: Eating and drinking  Follow these recommendations as told by your health care provider:  Take an oral rehydration solution (ORS). This is a drink that is sold at pharmacies and retail stores.  Drink clear fluids, such as water, ice chips, diluted fruit juice, and low-calorie sports drinks.  Eat bland, easy-to-digest foods in small amounts as  you are able. These foods include bananas, applesauce, rice, lean meats, toast, and crackers.  Avoid drinking fluids that contain a lot of sugar or caffeine, such as energy drinks, sports drinks, and soda.  Avoid alcohol.  Avoid spicy or fatty foods.  General instructions  Drink enough fluid to keep your urine clear or pale yellow.  Wash your hands often. If soap and water are not available, use hand sanitizer.  Make sure that all people in your household wash their hands well and often.  Take over-the-counter and prescription medicines only as told by your health care provider.  Rest at home while you recover.  Watch your condition for any changes.  Take a warm bath to relieve any burning or pain from frequent diarrhea episodes.  Keep all follow-up visits as told by your health care provider. This is important. Contact a health care provider if:  You have a fever.  Your diarrhea gets worse.  You have new symptoms.  You cannot keep fluids down.  You feel light-headed or dizzy.  You have a headache  You have muscle cramps. Get help right away if:  You have chest pain.  You feel extremely weak or you faint.  You have bloody or black stools or stools that look like tar.  You have severe pain, cramping, or bloating in your abdomen.  You have trouble breathing or you are breathing very quickly.  Your heart is beating very quickly.  Your skin feels cold and clammy.  You feel confused.  You have signs of dehydration, such as: ? Dark urine, very little urine, or no urine. ? Cracked lips. ? Dry mouth. ? Sunken eyes. ? Sleepiness. ? Weakness. This information is not intended to replace advice given to you by your health care provider. Make sure you discuss any questions you have with your health care provider. Document Released: 07/03/2002 Document Revised: 11/21/2015 Document Reviewed: 03/19/2015 Elsevier Interactive Patient Education  [employer].

## 2017-09-04 LAB — COMPREHENSIVE METABOLIC PANEL
AG Ratio: 1.5 (calc) (ref 1.0–2.5)
ALT: 9 U/L (ref 6–29)
AST: 17 U/L (ref 10–35)
Albumin: 4 g/dL (ref 3.6–5.1)
Alkaline phosphatase (APISO): 72 U/L (ref 33–130)
BUN/Creatinine Ratio: 16 (calc) (ref 6–22)
BUN: 14 mg/dL (ref 7–25)
CO2: 22 mmol/L (ref 20–32)
Calcium: 9.2 mg/dL (ref 8.6–10.4)
Chloride: 107 mmol/L (ref 98–110)
Creat: 0.89 mg/dL — ABNORMAL HIGH (ref 0.60–0.88)
Globulin: 2.7 g/dL (calc) (ref 1.9–3.7)
Glucose, Bld: 124 mg/dL — ABNORMAL HIGH (ref 65–99)
Potassium: 3.3 mmol/L — ABNORMAL LOW (ref 3.5–5.3)
Sodium: 142 mmol/L (ref 135–146)
Total Bilirubin: 0.6 mg/dL (ref 0.2–1.2)
Total Protein: 6.7 g/dL (ref 6.1–8.1)

## 2017-09-04 LAB — CBC WITH DIFFERENTIAL/PLATELET
Basophils Absolute: 53 cells/uL (ref 0–200)
Basophils Relative: 0.8 %
Eosinophils Absolute: 59 cells/uL (ref 15–500)
Eosinophils Relative: 0.9 %
HCT: 40.6 % (ref 35.0–45.0)
Hemoglobin: 14 g/dL (ref 11.7–15.5)
Lymphs Abs: 1657 cells/uL (ref 850–3900)
MCH: 28.6 pg (ref 27.0–33.0)
MCHC: 34.5 g/dL (ref 32.0–36.0)
MCV: 83 fL (ref 80.0–100.0)
MPV: 11.3 fL (ref 7.5–12.5)
Monocytes Relative: 9.9 %
Neutro Abs: 4178 cells/uL (ref 1500–7800)
Neutrophils Relative %: 63.3 %
Platelets: 296 10*3/uL (ref 140–400)
RBC: 4.89 10*6/uL (ref 3.80–5.10)
RDW: 13 % (ref 11.0–15.0)
Total Lymphocyte: 25.1 %
WBC mixed population: 653 cells/uL (ref 200–950)
WBC: 6.6 10*3/uL (ref 3.8–10.8)

## 2017-09-05 ENCOUNTER — Encounter: Payer: Self-pay | Admitting: Internal Medicine

## 2017-09-05 NOTE — Progress Notes (Signed)
Chief Complaint  Patient presents with  . Diarrhea   Follow up  1. She c/o diarrhea called PCP yesterday and wanted Lomotil instead of Immodium which she has been taking. She is having loose stools 3-4 x per day x 4 days to 1 week and others in the [facility] [place] with similar sx's.  Stool at times is also brown beads and she cant control when she urinates she has to stool at the same time.  Diarrhea is worse after food. Denies GI blood. She has tried immodium and she thinks she may have tried peptobismol w/o relief.  She reports she does avoid eating in the dining room where she lives b/c it upsets her stomach. Currently she has diarrhea but at times she does have constipation.  Denies n/v, fever, abdominal pain. She reports she did take macrobid last month for UTI sx's but no other antibiotics.   2. Per chart review wt is trending down with pelvic US c/w endometrial thickening and CT chest +lymph node will w/u CT chest/ab/pelvis. She has h/o breast cancer left breast on femara 3. H/o PE on Coumadin last INR 1.9 08/27/17    Review of Systems  Constitutional: Positive for weight loss.  HENT: Negative for hearing loss.   Respiratory: Negative for shortness of breath.   Cardiovascular: Negative for chest pain.  Gastrointestinal: Positive for diarrhea. Negative for abdominal pain, blood in stool, nausea and vomiting.  Skin: Negative for rash.   Past Medical History:  Diagnosis Date  . Allergy   . Breast cancer ([place]) 07/22/2012   T1c, Nx carcinoma left breast, ER 90%, PR 90%, HER-2/neu not over expressed.  . Hypertension   . Insomnia   . Osteoporosis    Prolia 10/2012  . Pulmonary embolus ([place]) 2009  . S/P IVC filter 2009   Past Surgical History:  Procedure Laterality Date  . BLADDER SURGERY    . BREAST SURGERY Left 2013   mastectomy  . CHOLECYSTECTOMY    . HERNIA REPAIR    . MASTECTOMY Left 2013   BREAST CA  . REPLACEMENT TOTAL KNEE     right  . TONSILLECTOMY     Family  History  Problem Relation Age of Onset  . COPD Mother   . Stroke Father   . Cancer Father        breast  . Breast cancer Maternal Aunt    Social History   Socioeconomic History  . Marital status: Widowed    Spouse name: Not on file  . Number of children: Not on file  . Years of education: Not on file  . Highest education level: Not on file  Social Needs  . Financial resource strain: Not on file  . Food insecurity - worry: Not on file  . Food insecurity - inability: Not on file  . Transportation needs - medical: Not on file  . Transportation needs - non-medical: Not on file  Occupational History  . Not on file  Tobacco Use  . Smoking status: Former Smoker    Last attempt to quit: 05/13/1999    Years since quitting: 18.3  . Smokeless tobacco: Never Used  Substance and Sexual Activity  . Alcohol use: No  . Drug use: No  . Sexual activity: Not on file  Other Topics Concern  . Not on file  Social History Narrative   Lives at [facility] at [place]. Has 2 sons. Widow.   Current Meds  Medication Sig  . ALPRAZolam (XANAX) 0.5 MG  tablet TAKE 1 TABLET EVERY NIGHT AT BEDTIME AS NEEDED FOR SLEEP OR ANXIETY  . baclofen (LIORESAL) 10 MG tablet Take 0.5 tablets (5 mg total) by mouth 3 (three) times daily as needed for muscle spasms.  . Biotin 10 MG TABS Take by mouth.  . bisoprolol-hydrochlorothiazide (ZIAC) 5-6.25 MG tablet Take 1 tablet by mouth daily.  . Cholecalciferol (VITAMIN D PO) Take 2,000 mg by mouth daily.  . fluticasone (FLONASE) 50 MCG/ACT nasal spray Place 2 sprays into both nostrils daily.  Marland Kitchen letrozole (FEMARA) 2.5 MG tablet Take 1 tablet (2.5 mg total) by mouth daily.  Marland Kitchen warfarin (COUMADIN) 1 MG tablet Take 1 mg by mouth on Monday, Thursday, and Saturday  . warfarin (COUMADIN) 3 MG tablet TAKE 1 TABLET DAILY   Allergies  Allergen Reactions  . Erythromycin   . Sulfa Drugs Cross Reactors     rash   Recent Results (from the past 2160 hour(s))  Vitamin D (25  hydroxy)     Status: None   Collection Time: 06/14/17 10:42 AM  Result Value Ref Range   VITD 44.15 30.00 - 100.00 ng/mL  INR/PT     Status: Abnormal   Collection Time: 06/14/17 10:42 AM  Result Value Ref Range   INR 4.8 (H) 0.8 - 1.0 ratio   Prothrombin Time 51.5 (H) 9.6 - 13.1 sec  Calcium     Status: None   Collection Time: 06/14/17 10:42 AM  Result Value Ref Range   Calcium 9.4 8.4 - 10.5 mg/dL  INR/PT     Status: Abnormal   Collection Time: 06/16/17 11:36 AM  Result Value Ref Range   INR 3.1 (H) 0.8 - 1.0 ratio   Prothrombin Time 33.5 (H) 9.6 - 13.1 sec  POCT INR     Status: Abnormal   Collection Time: 06/23/17 12:00 AM  Result Value Ref Range   INR 2.6 (A) 0.9 - 1.1  Protime-INR     Status: Abnormal   Collection Time: 06/23/17 12:00 AM  Result Value Ref Range   Protime 25.2 (A) 10.0 - 13.8  POCT INR     Status: Abnormal   Collection Time: 07/22/17 12:00 AM  Result Value Ref Range   INR 2.2 (A) 0.9 - 1.1  Protime-INR     Status: Abnormal   Collection Time: 07/22/17 12:00 AM  Result Value Ref Range   Protime 21.5 (A) 10.0 - 13.8  POCT INR     Status: Abnormal   Collection Time: 08/27/17 12:00 AM  Result Value Ref Range   INR 1.9 (A) 0.9 - 1.1  Protime-INR     Status: Abnormal   Collection Time: 08/27/17 12:00 AM  Result Value Ref Range   Protime 19.1 (A) 10.0 - 13.8  CBC with Differential/Platelet     Status: None   Collection Time: 09/03/17  3:46 PM  Result Value Ref Range   WBC 6.6 3.8 - 10.8 Thousand/uL   RBC 4.89 3.80 - 5.10 Million/uL   Hemoglobin 14.0 11.7 - 15.5 g/dL   HCT 40.6 35.0 - 45.0 %   MCV 83.0 80.0 - 100.0 fL   MCH 28.6 27.0 - 33.0 pg   MCHC 34.5 32.0 - 36.0 g/dL   RDW 13.0 11.0 - 15.0 %   Platelets 296 140 - 400 Thousand/uL   MPV 11.3 7.5 - 12.5 fL   Neutro Abs 4,178 1,500 - 7,800 cells/uL   Lymphs Abs 1,657 850 - 3,900 cells/uL   WBC mixed population 653 200 - 950  cells/uL   Eosinophils Absolute 59 15 - 500 cells/uL   Basophils Absolute  53 0 - 200 cells/uL   Neutrophils Relative % 63.3 %   Total Lymphocyte 25.1 %   Monocytes Relative 9.9 %   Eosinophils Relative 0.9 %   Basophils Relative 0.8 %  Comprehensive metabolic panel     Status: Abnormal   Collection Time: 09/03/17  3:46 PM  Result Value Ref Range   Glucose, Bld 124 (H) 65 - 99 mg/dL    Comment: .            Fasting reference interval . For someone without known diabetes, a glucose value between 100 and 125 mg/dL is consistent with prediabetes and should be confirmed with a follow-up test. .    BUN 14 7 - 25 mg/dL   Creat 0.89 (H) 0.60 - 0.88 mg/dL    Comment: For patients >71 years of age, the reference limit for Creatinine is approximately 13% higher for people identified as African-American. .    BUN/Creatinine Ratio 16 6 - 22 (calc)   Sodium 142 135 - 146 mmol/L   Potassium 3.3 (L) 3.5 - 5.3 mmol/L   Chloride 107 98 - 110 mmol/L   CO2 22 20 - 32 mmol/L   Calcium 9.2 8.6 - 10.4 mg/dL   Total Protein 6.7 6.1 - 8.1 g/dL   Albumin 4.0 3.6 - 5.1 g/dL   Globulin 2.7 1.9 - 3.7 g/dL (calc)   AG Ratio 1.5 1.0 - 2.5 (calc)   Total Bilirubin 0.6 0.2 - 1.2 mg/dL   Alkaline phosphatase (APISO) 72 33 - 130 U/L   AST 17 10 - 35 U/L   ALT 9 6 - 29 U/L   Objective  Body mass index is 25.81 kg/m. Wt Readings from Last 3 Encounters:  09/03/17 127 lb 12.8 oz (58 kg)  07/21/17 130 lb (59 kg)  04/21/17 136 lb 6.4 oz (61.9 kg)   Temp Readings from Last 3 Encounters:  09/03/17 98 F (36.7 C) (Oral)  04/21/17 98.3 F (36.8 C) (Oral)  03/19/17 97.9 F (36.6 C) (Oral)   BP Readings from Last 3 Encounters:  09/03/17 120/80  07/21/17 138/80  04/21/17 132/78   Pulse Readings from Last 3 Encounters:  09/03/17 (!) 107  07/21/17 79  04/21/17 75   O2 sat room air 93%   Physical Exam  Constitutional: She is oriented to person, place, and time and well-developed, well-nourished, and in no distress.  HENT:  Head: Normocephalic and atraumatic.   Mouth/Throat: Oropharynx is clear and moist and mucous membranes are normal.  Eyes: Conjunctivae are normal. Pupils are equal, round, and reactive to light.  Cardiovascular: Regular rhythm and normal heart sounds. Tachycardia present.  Pulmonary/Chest: Effort normal and breath sounds normal.  Abdominal: Soft. Bowel sounds are normal. There is no tenderness.  Neurological: She is alert and oriented to person, place, and time. Gait normal. Gait normal.  Skin: Skin is warm, dry and intact.  Psychiatric: Mood, memory, affect and judgment normal.  Nursing note and vitals reviewed.   Assessment   1. Diarrhea r/o infectious etiology vs overflow diarrhea associated with intermittent constipation  2. Abnormal wt loss with h/o left breast and US pelvis 2013 +endometrial thickening and prior CT chest +lymph node  3. H/o PE s/p IVC filter on Coumadin 08/27/17 INR 1.9  4. HM  Plan  1.  Check CMET, CBC, TSH, T4 today  Stool culture and C diff pt to bring sample back vs  call [facility] to see if can turn in there with my order  If neg ok to try lomotil instead of immodium  BRAT diet supportive care continue to try peptobismol for now   2.  Labs as above  Will order CT chest/ab/pelvis with contrast to w/u wt loss given h/o cancer.   3. Need to confirm coumadin dose with [facility]  4.  Had flu shot Had prevnar  pna 23 needs to be updated  Tdap had UTD Had zostavax disc shingrix in future   Out of age window pap, cologuard/colonoscopy, mammogram 07/08/17 neg  DEXA 05/27/17 osteopenia with nl vit D 44.15 05/2017 will make sure she is taking vit D3 1000 IU qd with calcium 600 mg bid    Provider: Dr. Olivia Mackie McLean-Scocuz[facility] Medicine

## 2017-09-06 ENCOUNTER — Telehealth: Payer: Self-pay | Admitting: Family Medicine

## 2017-09-06 DIAGNOSIS — R197 Diarrhea, unspecified: Secondary | ICD-10-CM | POA: Diagnosis not present

## 2017-09-06 LAB — T4, FREE: Free T4: 1.1 ng/dL (ref 0.8–1.8)

## 2017-09-06 LAB — TSH: TSH: 1.79 mIU/L (ref 0.40–4.50)

## 2017-09-06 NOTE — Telephone Encounter (Unsigned)
Copied from [place]. Topic: Quick Communication - See Telephone Encounter >> Sep 06, 2017  6:52 AM Hewitt Shorts wrote: CRM for notification. See Telephone encounter for:   09/06/17.pt is calling to let mclean know that she can not stay away from the restroom and she has the samples that mclean needed but not sure when she can bring them in and wants to know what to do next   Best number [phone]

## 2017-09-06 NOTE — Addendum Note (Signed)
Addended by: Arby Barrette on: 09/06/2017 09:04 AM   Modules accepted: Orders

## 2017-09-06 NOTE — Telephone Encounter (Signed)
Patient has someone to drop off the samples.

## 2017-09-07 LAB — CLOSTRIDIUM DIFFICILE BY PCR: Toxigenic C. Difficile by PCR: NEGATIVE

## 2017-09-08 ENCOUNTER — Ambulatory Visit: Payer: Medicare Other | Admitting: Family Medicine

## 2017-09-08 ENCOUNTER — Telehealth: Payer: Self-pay | Admitting: Family Medicine

## 2017-09-08 NOTE — Telephone Encounter (Signed)
FYI

## 2017-09-08 NOTE — Telephone Encounter (Signed)
fyi

## 2017-09-08 NOTE — Telephone Encounter (Unsigned)
Copied from [place] [id number]. Topic: Quick Communication - See Telephone Encounter >> Sep 08, 2017 10:34 AM Hewitt Shorts wrote: CRM for notification. See Telephone encounter for:  Pt is wanting to let the provider know that her bowel movements are going back to normal as of this morning   Best number [phone]  09/08/17.

## 2017-09-09 DIAGNOSIS — Z7901 Long term (current) use of anticoagulants: Secondary | ICD-10-CM | POA: Diagnosis not present

## 2017-09-10 ENCOUNTER — Telehealth: Payer: Self-pay | Admitting: Family Medicine

## 2017-09-10 ENCOUNTER — Encounter: Payer: Self-pay | Admitting: Family Medicine

## 2017-09-10 ENCOUNTER — Telehealth: Payer: Self-pay | Admitting: *Deleted

## 2017-09-10 LAB — POCT INR: INR: 2.9 — AB (ref 0.9–1.1)

## 2017-09-10 LAB — STOOL CULTURE: E coli, Shiga toxin Assay: NEGATIVE

## 2017-09-10 LAB — PROTIME-INR: Protime: 28.8 — AB (ref 10.0–13.8)

## 2017-09-10 NOTE — Telephone Encounter (Signed)
Copied from [place] [id number]. Topic: General - Other >> Sep 10, 2017  7:06 AM Carolyn Stare wrote:  Pt would like a call back concerning some scans she is suppose to have .She would like to know when the scans are scheduled   [phone]

## 2017-09-10 NOTE — Telephone Encounter (Signed)
Please contact the patient and let her know her INR is acceptable at 2.9.  She should continue with her current regimen of 4 mg on Saturday and 3 mg the rest of the days of the week.  We should recheck in 1 month.  Thanks.

## 2017-09-10 NOTE — Telephone Encounter (Signed)
Pt asked for Fransisco Beau to call her back. The appt note for the CT states that pt is to pick up a prep. Please contact pt to explain.

## 2017-09-10 NOTE — Telephone Encounter (Signed)
Pt notified. Mailed a copy of results also.

## 2017-09-10 NOTE — Telephone Encounter (Signed)
Tried to reach patient no answer and no voicemail, scan are CT scans are scheduled for 09/14/2017  8:30 AM  at  [facility].

## 2017-09-10 NOTE — Telephone Encounter (Signed)
Per CMA, I do not need to send anything to the nurse. She will recheck coumadin in one month.

## 2017-09-10 NOTE — Telephone Encounter (Signed)
Patient has been notified

## 2017-09-13 NOTE — Telephone Encounter (Signed)
Spoken to patient. She is just very stressed about scan and the results.

## 2017-09-14 ENCOUNTER — Ambulatory Visit
Admission: RE | Admit: 2017-09-14 | Discharge: 2017-09-14 | Disposition: A | Discharge status: Home / Self Care | Payer: Medicare Other | Source: Ambulatory Visit | Attending: Internal Medicine | Admitting: Internal Medicine

## 2017-09-14 DIAGNOSIS — I700 Atherosclerosis of aorta: Secondary | ICD-10-CM | POA: Insufficient documentation

## 2017-09-14 DIAGNOSIS — R634 Abnormal weight loss: Secondary | ICD-10-CM | POA: Insufficient documentation

## 2017-09-14 DIAGNOSIS — R9389 Abnormal findings on diagnostic imaging of other specified body structures: Secondary | ICD-10-CM | POA: Diagnosis not present

## 2017-09-14 DIAGNOSIS — I2510 Atherosclerotic heart disease of native coronary artery without angina pectoris: Secondary | ICD-10-CM | POA: Diagnosis not present

## 2017-09-14 DIAGNOSIS — Z853 Personal history of malignant neoplasm of breast: Secondary | ICD-10-CM | POA: Diagnosis not present

## 2017-09-14 DIAGNOSIS — R197 Diarrhea, unspecified: Secondary | ICD-10-CM | POA: Diagnosis not present

## 2017-09-14 DIAGNOSIS — K5730 Diverticulosis of large intestine without perforation or abscess without bleeding: Secondary | ICD-10-CM | POA: Diagnosis not present

## 2017-09-14 DIAGNOSIS — I2699 Other pulmonary embolism without acute cor pulmonale: Secondary | ICD-10-CM | POA: Diagnosis not present

## 2017-09-14 DIAGNOSIS — N8189 Other female genital prolapse: Secondary | ICD-10-CM | POA: Insufficient documentation

## 2017-09-14 DIAGNOSIS — R590 Localized enlarged lymph nodes: Secondary | ICD-10-CM | POA: Diagnosis not present

## 2017-09-14 DIAGNOSIS — Z9012 Acquired absence of left breast and nipple: Secondary | ICD-10-CM | POA: Diagnosis not present

## 2017-09-14 MED ORDER — IOPAMIDOL (ISOVUE-M 300) INJECTION 61%: 15.0000 mL | Freq: Once | INTRAMUSCULAR | Status: DC | PRN

## 2017-09-14 MED ORDER — IOPAMIDOL (ISOVUE-300) INJECTION 61%
100.0000 mL | Freq: Once | INTRAVENOUS | Status: AC | PRN
  Administered 2017-09-14: 100 mL via INTRAVENOUS

## 2017-09-14 NOTE — Telephone Encounter (Signed)
Son called - would like to have call about ct  Please call son at [phone]

## 2017-09-14 NOTE — Telephone Encounter (Signed)
Patients son was informed of results.  Patients son understood and no questions, comments, or concerns at this time.  

## 2017-09-23 DIAGNOSIS — Z7901 Long term (current) use of anticoagulants: Secondary | ICD-10-CM | POA: Diagnosis not present

## 2017-09-24 ENCOUNTER — Telehealth: Payer: Self-pay | Admitting: Family Medicine

## 2017-09-24 ENCOUNTER — Encounter: Payer: Self-pay | Admitting: Family Medicine

## 2017-09-24 NOTE — Telephone Encounter (Signed)
Patient notified. Patient states she never received her INR form 09/10/17. Informed patient that per her chart she was notified 09/10/17 at 4:52 of INR results. Patient states she can not hear well and sometimes misses messages.

## 2017-09-24 NOTE — Telephone Encounter (Signed)
Received INR of 2.9 on PCP desk, from previous reading therapeutic for patient , Patient current dose of coumadin 4 mg on Saturday 3 mg all other days.

## 2017-09-24 NOTE — Telephone Encounter (Signed)
Reviewed dr Ellen Henri last call with pt-  2.9 is therapeutic. She had the same INR on 09/10/17. She may continue regimen

## 2017-10-01 ENCOUNTER — Telehealth: Payer: Self-pay | Admitting: Family Medicine

## 2017-10-01 ENCOUNTER — Ambulatory Visit (INDEPENDENT_AMBULATORY_CARE_PROVIDER_SITE_OTHER): Payer: Medicare Other | Admitting: Family Medicine

## 2017-10-01 ENCOUNTER — Encounter: Payer: Self-pay | Admitting: Family Medicine

## 2017-10-01 ENCOUNTER — Other Ambulatory Visit: Payer: Self-pay

## 2017-10-01 DIAGNOSIS — R197 Diarrhea, unspecified: Secondary | ICD-10-CM | POA: Diagnosis not present

## 2017-10-01 DIAGNOSIS — Z86711 Personal history of pulmonary embolism: Secondary | ICD-10-CM | POA: Diagnosis not present

## 2017-10-01 DIAGNOSIS — R202 Paresthesia of skin: Secondary | ICD-10-CM

## 2017-10-01 MED ORDER — WARFARIN SODIUM 1 MG PO TABS: ORAL_TABLET | ORAL | 3 refills | Status: DC

## 2017-10-01 NOTE — Assessment & Plan Note (Signed)
Continues on warfarin.  INR does vary and I suspect some of her variance in the past has been related to antibiotics that she has been on at those times.  I discussed the options of Eliquis and Xarelto with her though she was hesitant given inability to reverse these medications.  For now she will continue on warfarin.

## 2017-10-01 NOTE — Telephone Encounter (Signed)
Copied from [place]. Topic: Quick Communication - See Telephone Encounter >> Oct 01, 2017 10:02 AM Cleaster Corin, NT wrote: CRM for notification. See Telephone encounter for:   10/01/17. Pt. Calling to schedule appt. With Dr. [name] with reacquiring diarrhea that again on yesterday 10/01/17. I couldn't get her on the schedule until April 12th seeing if she could be fit in [phone]

## 2017-10-01 NOTE — Assessment & Plan Note (Signed)
Suspect related to nerve impingement likely in her neck given prior findings of degenerative changes on x-ray.  She is neurologically intact at this time.  She does have positive Spurling's on the right.  I discussed the option of completing an MRI to further characterize these lesions or referral back to orthopedics.  I also advised that she should sleep in a different position to see if that is helpful.  She will let us know what she decides to do next week as she was indecisive today.  If she has persistent symptoms or new symptoms she will be reevaluated.

## 2017-10-01 NOTE — Telephone Encounter (Signed)
Scheduled patient with Dr.Sonnenberg today at 1:15

## 2017-10-01 NOTE — Patient Instructions (Addendum)
Nice to see you.  We will have you monitor your diet and if your diarrhea does not improve could consider lomotil. Please change positions while you sleep. Please consider if you want to get an MRI of your neck or see orthopedics again.  If you develop persistent numbness, new numbness or weakness, abdominal pain, blood in your stool, fevers, or any new or changing symptoms please let us know. Please contact us next week to let us know how your loose stools are doing.

## 2017-10-01 NOTE — Progress Notes (Signed)
[name], MD Phone: [phone]  [name] is a 84 y.o. female who presents today for same-day visit.  Patient notes loose stools starting yesterday.  Does not describe them as watery.  They are yellow.  Similar to the last time she had loose stools for which was evaluated about a month ago fairly extensively with lab work, stool studies, and CT abdomen pelvis and chest.  She notes no stomach pain, blood in her stool, nausea, or vomiting.  No changes in medications.  No fevers.  No sick contacts.  Notes it typically occurs after eating a meal and has noticed that wheat products can cause some loose stools.  She had 3 bowel movements yesterday.  In the past Lomotil has helped with this though she has not taken any recently.  She reports that her right arm falls asleep at night while she is sleeping.  Feels as though there is tingling from her shoulder down to her fingers.  Only occurs at night and resolves with getting up and changing position.  Does not occur every night.  There is no associated sensation change or weakness.  She has no tingling elsewhere.  She has previously been evaluated for neck pain by orthopedics and has had 2 x-rays that revealed degenerative changes.  Patient does wonder about the newer medications for prevention of PE.  She does have an IVC filter in place.  She has been on Coumadin for some time now with no recurrent issues though does voice displeasure with her dosage changes.  Social History   Tobacco Use  Smoking Status Former Smoker  . Last attempt to quit: 05/13/1999  . Years since quitting: 18.4  Smokeless Tobacco Never Used     ROS see history of present illness  Objective  Physical Exam Vitals:   10/01/17 1318  BP: (!) 142/90  Pulse: 75  Temp: (!) 97.4 F (36.3 C)  SpO2: 96%    BP Readings from Last 3 Encounters:  10/01/17 (!) 142/90  09/03/17 120/80  07/21/17 138/80   Wt Readings from Last 3 Encounters:  10/01/17 125 lb 9.6 oz  (57 kg)  09/03/17 127 lb 12.8 oz (58 kg)  07/21/17 130 lb (59 kg)    Physical Exam  Constitutional: No distress.  Cardiovascular: Normal rate, regular rhythm and normal heart sounds.  Pulmonary/Chest: Effort normal and breath sounds normal.  Abdominal: Soft. Bowel sounds are normal. She exhibits no distension. There is no tenderness. There is no rebound and no guarding.  Musculoskeletal: She exhibits no edema.  No midline neck step-off, no midline neck tenderness, no muscular neck tenderness, discomfort on the right side on Spurling's testing, no discomfort on left side on Spurling's testing  Neurological: She is alert. Gait normal.  CN 2-12 intact, 5/5 strength in bilateral biceps, triceps, grip, quads, hamstrings, plantar and dorsiflexion, sensation to light touch intact in bilateral UE and LE  Skin: Skin is warm and dry. She is not diaphoretic.     Assessment/Plan: Please see individual problem list.  Diarrhea I suspect symptoms are more likely related to IBS or food intolerance as opposed to an infectious cause.  She had negative infectious workup recently.  She has a benign exam.  I discussed fodmap diet with her and discussed cutting things out progressively and adding them back if her symptoms do not improve.  Could consider adding Lomotil if not improving.  She will contact us next week to see how she is doing.  Positional right arm tingling while  sleeping Suspect related to nerve impingement likely in her neck given prior findings of degenerative changes on x-ray.  She is neurologically intact at this time.  She does have positive Spurling's on the right.  I discussed the option of completing an MRI to further characterize these lesions or referral back to orthopedics.  I also advised that she should sleep in a different position to see if that is helpful.  She will let us know what she decides to do next week as she was indecisive today.  If she has persistent symptoms or new  symptoms she will be reevaluated.  History of pulmonary embolus (PE) Continues on warfarin.  INR does vary and I suspect some of her variance in the past has been related to antibiotics that she has been on at those times.  I discussed the options of Eliquis and Xarelto with her though she was hesitant given inability to reverse these medications.  For now she will continue on warfarin.   No orders of the defined types were placed in this encounter.   Meds ordered this encounter  Medications  . warfarin (COUMADIN) 1 MG tablet    Sig: Take 1 mg by mouth on Saturday    Dispense:  30 tablet    Refill:  [place], MD [place]

## 2017-10-01 NOTE — Assessment & Plan Note (Signed)
I suspect symptoms are more likely related to IBS or food intolerance as opposed to an infectious cause.  She had negative infectious workup recently.  She has a benign exam.  I discussed fodmap diet with her and discussed cutting things out progressively and adding them back if her symptoms do not improve.  Could consider adding Lomotil if not improving.  She will contact us next week to see how she is doing.

## 2017-10-05 ENCOUNTER — Telehealth: Payer: Self-pay | Admitting: Family Medicine

## 2017-10-05 DIAGNOSIS — K529 Noninfective gastroenteritis and colitis, unspecified: Secondary | ICD-10-CM

## 2017-10-05 NOTE — Telephone Encounter (Signed)
Please advise 

## 2017-10-05 NOTE — Telephone Encounter (Unsigned)
Copied from [place] [id number]. Topic: Quick Communication - See Telephone Encounter >> Oct 05, 2017  1:43 PM Hewitt Shorts wrote: CRM for notification. See Telephone encounter for: pt is wanting to know if sonnenberg wants there to take one of these medications for diarrhea, cholestyramine, levsin,or viberzi  10/05/17.

## 2017-10-05 NOTE — Telephone Encounter (Signed)
I would not recommend those at this time.  Please see if she has continued to have symptoms or if they have improved.  She was supposed to try dietary changes.  Thanks.

## 2017-10-06 NOTE — Telephone Encounter (Signed)
Pt. Called to report her diarrhea has not changed - is having approximately 3 loose stools a day. States she is aware of dietary recommendations. States she wants to know what Dr. Biagio Quint recommends next. Lomotil is not helping. Please call her back.

## 2017-10-06 NOTE — Telephone Encounter (Signed)
Referral placed.

## 2017-10-06 NOTE — Telephone Encounter (Signed)
Returned [name] phone call and she states that she would like to see GI. She is not sure who she would like to see but I informed her that I would let Dr. [name] know she wants to see GI and would like a referral.

## 2017-10-06 NOTE — Telephone Encounter (Signed)
I would suggest that we have her see GI to see what their recommendations would be.  Please also find out where she got the Lomotil from I do not believe I prescribed this and it is not on her medication list currently.

## 2017-10-06 NOTE — Telephone Encounter (Signed)
Please advise 

## 2017-10-18 ENCOUNTER — Other Ambulatory Visit: Payer: Self-pay | Admitting: Family Medicine

## 2017-10-18 NOTE — Telephone Encounter (Signed)
refill 

## 2017-10-18 NOTE — Telephone Encounter (Signed)
LOV 04/21/17 with Dr. [name] for annual exam.  Saw Dr. [name] 10/01/17 for diarrhea.   She has requested a refill on the Lomotil but don't see where this is a prescription drug on her list.  [facility] - [place], [place]  [address]

## 2017-10-18 NOTE — Telephone Encounter (Signed)
Copied from [place] [id number]. Topic: Quick Communication - Rx Refill/Question >> Oct 18, 2017  9:05 AM Ahmed Prima L wrote: Medication: bisoprolol-hydrochlorothiazide (ZIAC) 5-6.25 MG tablet, warfarin (COUMADIN) 3 MG tablet, ALPRAZolam (XANAX) 0.5 MG tablet, Lomotil 2.5mg  Has the patient contacted their pharmacy? yes (Agent: If no, request that the patient contact the pharmacy for the refill.) Preferred Pharmacy (with phone number or street name): [facility] - [place], [place]: Please be advised that RX refills may take up to 3 business days. We ask that you follow-up with your pharmacy.

## 2017-10-19 MED ORDER — WARFARIN SODIUM 1 MG PO TABS: ORAL_TABLET | ORAL | 3 refills | Status: DC

## 2017-10-19 MED ORDER — WARFARIN SODIUM 3 MG PO TABS: ORAL_TABLET | ORAL | 1 refills | Status: DC

## 2017-10-19 MED ORDER — BISOPROLOL-HYDROCHLOROTHIAZIDE 5-6.25 MG PO TABS: 1.0000 | ORAL_TABLET | Freq: Every day | ORAL | 3 refills | Status: DC

## 2017-10-19 NOTE — Telephone Encounter (Signed)
I did not prescribe Lomotil previously.  We discussed having her see GI.  Please see if she has continued to take the Xanax and see if she feels she is getting any benefit from this.  If so please counsel her that this may make her drowsy and if it does she should not drive.  She should also be counseled to not drink any alcohol while taking the Xanax.  If she does not feel she is getting any benefit from this we should consider tapering her off of this.  Thanks.

## 2017-10-19 NOTE — Telephone Encounter (Signed)
Last OV 10/01/17 last filled 03/15/17 90 1rf

## 2017-10-20 DIAGNOSIS — Z7901 Long term (current) use of anticoagulants: Secondary | ICD-10-CM | POA: Diagnosis not present

## 2017-10-20 LAB — PROTIME-INR: Protime: 30.8 — AB (ref 10.0–13.8)

## 2017-10-20 LAB — POCT INR: INR: 3.2 — AB (ref 0.9–1.1)

## 2017-10-20 NOTE — Telephone Encounter (Signed)
Patent would like an update on this. Was this approved?

## 2017-10-20 NOTE — Telephone Encounter (Signed)
Patient states she is ok with tapering it, she states she currently taking half a tablet. She states she is still tossing and turning and still wakes up at 5 am. She would like to taper off and maybe try a sleeping pill instead. She states she is not having diarrhea anymore and is going to cancel her GI appointment. Informed her it is not recommended.

## 2017-10-21 ENCOUNTER — Encounter: Payer: Self-pay | Admitting: Family Medicine

## 2017-10-21 NOTE — Telephone Encounter (Signed)
I have refaxed prolia order I have not received a reply.

## 2017-10-21 NOTE — Telephone Encounter (Signed)
She can take half a tablet every other night for 1 week and then half a tablet every third night for 1 week and then discontinue this medication.  If she does not have enough medication to complete this taper please let me know.

## 2017-10-22 ENCOUNTER — Telehealth: Payer: Self-pay | Admitting: Family Medicine

## 2017-10-22 ENCOUNTER — Telehealth: Payer: Self-pay

## 2017-10-22 NOTE — Telephone Encounter (Signed)
Patient states that she is needing a refill on the xanax.

## 2017-10-22 NOTE — Telephone Encounter (Signed)
Left message for patient to return call to office, [place] nurse may advise patient.

## 2017-10-22 NOTE — Telephone Encounter (Signed)
Please let the patient know that her INR is slightly elevated at 3.2.  She should decrease her Saturday dose to 2 mg and take 3 mg Sunday through Friday.  She should have this rechecked in 2 weeks.

## 2017-10-22 NOTE — Telephone Encounter (Signed)
Second message left asking patient to return call.

## 2017-10-22 NOTE — Telephone Encounter (Signed)
Patient notified of INR results verbalized understanding , patient was also given coumadin dosage per Dr. [name] to take 2mg  on Saturday and 3mg  Sunday-Friday then return for lab. Patient also states that she need refill on xanax message has been sent to Dr. [name].

## 2017-10-23 MED ORDER — ALPRAZOLAM 0.5 MG PO TABS: ORAL_TABLET | ORAL | 0 refills | Status: DC

## 2017-10-23 NOTE — Telephone Encounter (Signed)
Sent to pharmacy 

## 2017-10-26 ENCOUNTER — Ambulatory Visit: Payer: Medicare Other | Admitting: Gastroenterology

## 2017-10-27 NOTE — Telephone Encounter (Signed)
Noted. Were you able to contact the patient regarding her INR? Please see initial message.  Thanks.

## 2017-10-27 NOTE — Telephone Encounter (Signed)
After three attempts I have received approval for Prolia and pe insurance verification at no cost to patient ok to schedule.

## 2017-10-28 NOTE — Telephone Encounter (Signed)
Reached patient on Monday for INR patient has been on new dosing schedule since 10/25/17, patient has complaint of right arm tingling and prickly sensation for last nine years, wanted see PCP and advise him of the problem and she believes the coumadin is the cause because she started coumadin nine years ago patient would also like to receive her Prolia injection while in office. Patient only wanted to see PCP advised patient PCP wanted her evaluated sooner she said no she would wait til Monday, also advised patient if any change she should really be seen ASAP at [facility] or ED.

## 2017-10-29 DIAGNOSIS — Z7901 Long term (current) use of anticoagulants: Secondary | ICD-10-CM | POA: Diagnosis not present

## 2017-10-30 LAB — POCT INR: INR: 2.7 — AB (ref 0.9–1.1)

## 2017-10-30 LAB — PROTIME-INR: Protime: 26.3 — AB (ref 10.0–13.8)

## 2017-11-01 ENCOUNTER — Ambulatory Visit (INDEPENDENT_AMBULATORY_CARE_PROVIDER_SITE_OTHER): Payer: Medicare Other | Admitting: Family Medicine

## 2017-11-01 ENCOUNTER — Other Ambulatory Visit: Payer: Self-pay | Admitting: General Surgery

## 2017-11-01 ENCOUNTER — Encounter: Payer: Self-pay | Admitting: Family Medicine

## 2017-11-01 VITALS — BP 152/90 | HR 70 | Temp 97.7°F | Wt 126.6 lb

## 2017-11-01 DIAGNOSIS — G4700 Insomnia, unspecified: Secondary | ICD-10-CM

## 2017-11-01 DIAGNOSIS — M5412 Radiculopathy, cervical region: Secondary | ICD-10-CM

## 2017-11-01 DIAGNOSIS — M8580 Other specified disorders of bone density and structure, unspecified site: Secondary | ICD-10-CM | POA: Diagnosis not present

## 2017-11-01 DIAGNOSIS — R202 Paresthesia of skin: Secondary | ICD-10-CM

## 2017-11-01 DIAGNOSIS — Z86711 Personal history of pulmonary embolism: Secondary | ICD-10-CM | POA: Diagnosis not present

## 2017-11-01 MED ORDER — MIRTAZAPINE 7.5 MG PO TABS: 7.5000 mg | ORAL_TABLET | Freq: Every day | ORAL | 1 refills | Status: DC

## 2017-11-01 MED ORDER — DENOSUMAB 60 MG/ML ~~LOC~~ SOLN
60.0000 mg | Freq: Once | SUBCUTANEOUS | Status: AC
  Administered 2017-11-01: 60 mg via SUBCUTANEOUS

## 2017-11-01 NOTE — Progress Notes (Signed)
[name], MD Phone: [phone]  [name] is a 84 y.o. female who presents today for f/u.  Sleep difficulty: She is tapering off of Xanax now.  It has not been beneficial.  She has tried trazodone in the past with little benefit.  Takes her several hours to fall asleep.  When she is asleep she stays asleep.  No afternoon caffeine.  No alcohol intake.  No TV in bed though does sometimes watch TV prior to bed.  Reads in bed.  Some depression and anxiety.  No SI.  Typically goes to bed around 11:30 or 12:30 at night and takes another couple hours to fall asleep and has been waking up around 6.  No SI.  She has had chronic radicular symptoms with numbness and tingling in her right arm and left hand for some time now.  States it feels like they have gone to sleep.  Prior x-ray with degenerative changes.  Only bothers her at night.  If she changes position it improves.  Does not occur during the day.  No weakness.  Did see neurology for tremor in the past.  She does have a history of PE.  She has an IVC filter in place and has been on Coumadin.  She has had no recurrence. no chest pain or shortness of breath.  Social History   Tobacco Use  Smoking Status Former Smoker  . Last attempt to quit: 05/13/1999  . Years since quitting: 18.4  Smokeless Tobacco Never Used     ROS see history of present illness  Objective  Physical Exam Vitals:   11/01/17 1138  BP: (!) 152/90  Pulse: 70  Temp: 97.7 F (36.5 C)  SpO2: 97%    BP Readings from Last 3 Encounters:  11/01/17 (!) 152/90  10/01/17 (!) 142/90  09/03/17 120/80   Wt Readings from Last 3 Encounters:  11/01/17 126 lb 9.6 oz (57.4 kg)  10/01/17 125 lb 9.6 oz (57 kg)  09/03/17 127 lb 12.8 oz (58 kg)    Physical Exam  Constitutional: No distress.  Cardiovascular: Normal rate, regular rhythm and normal heart sounds.  Pulmonary/Chest: Effort normal and breath sounds normal.  Musculoskeletal: She exhibits no edema.    No midline neck tenderness, no midline neck step-off, no muscular neck tenderness  Neurological: She is alert. Gait normal.  5/5 strength in bilateral biceps, triceps, grip, quads, hamstrings, plantar and dorsiflexion, sensation to light touch intact in bilateral UE and LE  Skin: Skin is warm and dry. She is not diaphoretic.     Assessment/Plan: Please see individual problem list.  Osteopenia Due for Prolia.  This is given today.  Encouraged to take calcium and vitamin D supplementation.  Positional right arm tingling while sleeping She continues to have issues with this.  There may be some nerve impingement involved in her neck.  Prior x-ray imaging revealed degenerative changes.  Does report occasional symptoms in her left hand as well when she is asleep.  Discussed options for MRI versus physical therapy versus referral to a specialist.  I discussed given her symptoms she would likely need to see a specialist and that would be what I would recommend.  Referral to physical medicine and rehab placed to determine if there are nonsurgical options for intervention.  Insomnia She is tapering off of Xanax.  This has not been beneficial.  I suspect depression probably plays some role in her sleep issues as well as age playing a role.  We will trial  her on Remeron to see if that will help with depression as well as sleep.  Given return precautions.  History of pulmonary embolus (PE) No recurrence.  INR 2.7 today.  She will continue her current Coumadin dose.  Orders Placed This Encounter  Procedures  . Ambulatory referral to Physical Medicine Rehab    Referral Priority:   Routine    Referral Type:   Rehabilitation    Referral Reason:   Specialty Services Required    Requested Specialty:   Physical Medicine and Rehabilitation    Number of Visits Requested:   1    Meds ordered this encounter  Medications  . mirtazapine (REMERON) 7.5 MG tablet    Sig: Take 1 tablet (7.5 mg total) by mouth  at bedtime.    Dispense:  90 tablet    Refill:  1     [name], MD [place]

## 2017-11-01 NOTE — Assessment & Plan Note (Signed)
She is tapering off of Xanax.  This has not been beneficial.  I suspect depression probably plays some role in her sleep issues as well as age playing a role.  We will trial her on Remeron to see if that will help with depression as well as sleep.  Given return precautions.

## 2017-11-01 NOTE — Assessment & Plan Note (Signed)
No recurrence.  INR 2.7 today.  She will continue her current Coumadin dose.

## 2017-11-01 NOTE — Assessment & Plan Note (Signed)
She continues to have issues with this.  There may be some nerve impingement involved in her neck.  Prior x-ray imaging revealed degenerative changes.  Does report occasional symptoms in her left hand as well when she is asleep.  Discussed options for MRI versus physical therapy versus referral to a specialist.  I discussed given her symptoms she would likely need to see a specialist and that would be what I would recommend.  Referral to physical medicine and rehab placed to determine if there are nonsurgical options for intervention.

## 2017-11-01 NOTE — Addendum Note (Signed)
Addended by: Nanci Pina on: 11/01/2017 04:01 PM   Modules accepted: Orders

## 2017-11-01 NOTE — Patient Instructions (Signed)
Nice to see you. We will try you on Remeron for depression as well as sleep.  Please take this at night. I am referring you to Dr Sharlet Salina.  If you develop thoughts of harming yourself please seek medical attention immediately.

## 2017-11-01 NOTE — Assessment & Plan Note (Signed)
Due for Prolia.  This is given today.  Encouraged to take calcium and vitamin D supplementation.

## 2017-11-05 DIAGNOSIS — M5412 Radiculopathy, cervical region: Secondary | ICD-10-CM | POA: Diagnosis not present

## 2017-11-05 DIAGNOSIS — M5030 Other cervical disc degeneration, unspecified cervical region: Secondary | ICD-10-CM | POA: Diagnosis not present

## 2017-11-09 ENCOUNTER — Encounter: Payer: Self-pay | Admitting: Family Medicine

## 2017-11-24 DIAGNOSIS — Z7901 Long term (current) use of anticoagulants: Secondary | ICD-10-CM | POA: Diagnosis not present

## 2017-11-24 LAB — POCT INR: INR: 1.8 — AB (ref 0.9–1.1)

## 2017-11-24 LAB — PROTIME-INR: Protime: 18.4 — AB (ref 10.0–13.8)

## 2017-11-30 ENCOUNTER — Encounter: Payer: Self-pay | Admitting: Family Medicine

## 2017-11-30 ENCOUNTER — Telehealth: Payer: Self-pay | Admitting: Family Medicine

## 2017-11-30 MED ORDER — BACLOFEN 10 MG PO TABS: 5.0000 mg | ORAL_TABLET | Freq: Three times a day (TID) | ORAL | 0 refills | Status: DC | PRN

## 2017-11-30 NOTE — Telephone Encounter (Signed)
Copied from [place]. Topic: Quick Communication - Rx Refill/Question >> Nov 30, 2017  3:15 PM Arletha Grippe wrote: Medication: lomotil tabs 2.5 mg  baclofen (LIORESAL) 10 MG tablet Has the patient contacted their pharmacy? No. (Agent: If no, request that the patient contact the pharmacy for the refill.) Preferred Pharmacy (with phone number or street name): [facility]  Agent: Please be advised that RX refills may take up to 3 business days. We ask that you follow-up with your pharmacy.  Lomotil is not on med list

## 2017-11-30 NOTE — Telephone Encounter (Signed)
Patient notified and states she takes her coumadine at night and will start tonight with new dose

## 2017-11-30 NOTE — Telephone Encounter (Signed)
Copied from [place] [id number]. Topic: Quick Communication - See Telephone Encounter >> Nov 30, 2017  3:18 PM Arletha Grippe wrote: CRM for notification. See Telephone encounter for: 11/30/17. Pt needs to have call as soon as possible about coumadin levels and if she needs to change her dosage. Please call [phone]

## 2017-11-30 NOTE — Telephone Encounter (Signed)
We have not received results, I have requested lab results form [facility]. Patients INR is  1.8 and protime is 18.4. Patient is currently taking:  Monday 3 mg Tuesday 3 mg Wednesday 3 mg Thursday 3 mg  Friday 3 mg  Saturday 2 mg  Sunday 3 mg

## 2017-11-30 NOTE — Telephone Encounter (Signed)
Please let the patient know that her INR is low.  We will need to increase her dose of Coumadin.  She will take 4 mg on Wednesdays and she will take 3 mg on Thursday through Tuesday.  We will need to recheck in 2 weeks.  Thanks.

## 2017-11-30 NOTE — Telephone Encounter (Signed)
Please advise 

## 2017-11-30 NOTE — Telephone Encounter (Signed)
Patient would also like a refill on baclofen and lomotil

## 2017-12-01 MED ORDER — DIPHENOXYLATE-ATROPINE 2.5-0.025 MG PO TABS: 1.0000 | ORAL_TABLET | Freq: Four times a day (QID) | ORAL | 0 refills | Status: DC | PRN

## 2017-12-01 NOTE — Telephone Encounter (Signed)
Patient uses this as needed when her stomach is upset

## 2017-12-01 NOTE — Telephone Encounter (Signed)
Please advise 

## 2017-12-01 NOTE — Telephone Encounter (Signed)
Attempted to call patient to clarify why she would need the lomotil 2.5 mg tab. No answer, left message for pt to return call to [place]. Provider  Dr. [name]  LOV 11/01/17 NOV  02/07/18

## 2017-12-01 NOTE — Telephone Encounter (Signed)
Lomotil sent to pharmacy.  If she develops urine retention, dry mouth, constipation, or if she starts to require this for more than a couple of days she needs to be evaluated.

## 2017-12-02 ENCOUNTER — Telehealth: Payer: Self-pay

## 2017-12-02 NOTE — Telephone Encounter (Signed)
Patient notified

## 2017-12-02 NOTE — Telephone Encounter (Signed)
Copied from [place] [phone]. Topic: Quick Communication - See Telephone Encounter >> Nov 30, 2017  3:18 PM Arletha Grippe wrote: CRM for notification. See Telephone encounter for: 11/30/17. Pt needs to have call as soon as possible about coumadin levels and if she needs to change her dosage. Please call [phone]  >> Dec 02, 2017 10:13 AM Ahmed Prima L wrote: Patient just talked to [facility]. She states that [facility] told her the reason they have not shipped any medication because they have tried three times to get the medications filled from Dr [name]. I advised patient that as soon as Janett Billow could she would reach her back.

## 2017-12-02 NOTE — Telephone Encounter (Signed)
Updated patient coumadin dosage and patient stated she has received her medications from [facility].

## 2017-12-03 ENCOUNTER — Other Ambulatory Visit: Payer: Self-pay

## 2017-12-13 ENCOUNTER — Telehealth: Payer: Self-pay | Admitting: Gastroenterology

## 2017-12-13 ENCOUNTER — Telehealth: Payer: Self-pay

## 2017-12-13 DIAGNOSIS — K529 Noninfective gastroenteritis and colitis, unspecified: Secondary | ICD-10-CM

## 2017-12-13 NOTE — Addendum Note (Signed)
Addended by: [name] on: 12/13/2017 06:07 PM   Modules accepted: Orders

## 2017-12-13 NOTE — Telephone Encounter (Signed)
Patient has an appt with Dr Bonna Gains on 7/2. I called to offer her one on 6/4 at 10:30 but she refused it. She was scared she might have to go to the bathroom on the way here.

## 2017-12-13 NOTE — Telephone Encounter (Signed)
Referral placed.

## 2017-12-13 NOTE — Telephone Encounter (Signed)
Copied from [place] [phone]. Topic: Inquiry >> Dec 13, 2017 10:53 AM Conception Chancy, NT wrote: Reason for CRM: patient is calling and states 6 weeks ago Dr. [name] sent a referral to [place] [facility]. She states when it came appointment time she was not able to keep the appointment. She states she is needing another referral and states [facility] is filled until the middle of July so she would like the referral elsewhere. She states she was advised to contact the [facility] Please contact patient.

## 2017-12-13 NOTE — Telephone Encounter (Signed)
Please advise 

## 2017-12-23 DIAGNOSIS — Z7901 Long term (current) use of anticoagulants: Secondary | ICD-10-CM | POA: Diagnosis not present

## 2017-12-23 LAB — POCT INR: INR: 2.3 — AB (ref 0.9–1.1)

## 2017-12-24 DIAGNOSIS — R197 Diarrhea, unspecified: Secondary | ICD-10-CM | POA: Diagnosis not present

## 2017-12-27 ENCOUNTER — Telehealth: Payer: Self-pay | Admitting: Family Medicine

## 2017-12-27 ENCOUNTER — Encounter: Payer: Self-pay | Admitting: Family Medicine

## 2017-12-27 NOTE — Telephone Encounter (Signed)
Patient notified

## 2017-12-27 NOTE — Telephone Encounter (Signed)
Please let the patient know that her INR is 2.3.  This is in the acceptable range.  She should continue with her current dosage of Coumadin.  We will recheck in 1 month.

## 2017-12-28 ENCOUNTER — Telehealth: Payer: Self-pay | Admitting: Family Medicine

## 2017-12-28 NOTE — Telephone Encounter (Signed)
Patient notified she is to take 4 mg on Wednesday and 3 mg on all other days.Patient verbalized understanding.

## 2017-12-28 NOTE — Telephone Encounter (Signed)
Copied from [place] [phone]. Topic: Quick Communication - See Telephone Encounter >> Dec 28, 2017 12:43 PM Rutherford Nail, NT wrote: CRM for notification. See Telephone encounter for: 12/28/17. Patient is requesting a call back to discuss whether she needs to continue the same amount of coumadin for the month of June (4mg  on saturdays and 3mg  on the rest of the week)? Patient states that her letter mistakenly got taken out with the trash this morning and she did not remember what it said. States that a message can be left on her voicemail, she requests to just speak up when talking. Please advise. CB#: [phone]

## 2017-12-28 NOTE — Telephone Encounter (Signed)
Please advise 

## 2017-12-29 DIAGNOSIS — M5030 Other cervical disc degeneration, unspecified cervical region: Secondary | ICD-10-CM | POA: Diagnosis not present

## 2017-12-29 DIAGNOSIS — M5412 Radiculopathy, cervical region: Secondary | ICD-10-CM | POA: Diagnosis not present

## 2018-01-05 ENCOUNTER — Other Ambulatory Visit: Payer: Self-pay

## 2018-01-05 MED ORDER — FLUTICASONE PROPIONATE 50 MCG/ACT NA SUSP: 2.0000 | Freq: Every day | NASAL | 6 refills | Status: DC

## 2018-01-06 DIAGNOSIS — M5010 Cervical disc disorder with radiculopathy, unspecified cervical region: Secondary | ICD-10-CM | POA: Diagnosis not present

## 2018-01-06 DIAGNOSIS — I10 Essential (primary) hypertension: Secondary | ICD-10-CM | POA: Diagnosis not present

## 2018-01-06 DIAGNOSIS — M1712 Unilateral primary osteoarthritis, left knee: Secondary | ICD-10-CM | POA: Diagnosis not present

## 2018-01-06 DIAGNOSIS — F329 Major depressive disorder, single episode, unspecified: Secondary | ICD-10-CM | POA: Diagnosis not present

## 2018-01-06 DIAGNOSIS — M8580 Other specified disorders of bone density and structure, unspecified site: Secondary | ICD-10-CM | POA: Diagnosis not present

## 2018-01-06 DIAGNOSIS — R251 Tremor, unspecified: Secondary | ICD-10-CM | POA: Diagnosis not present

## 2018-01-10 DIAGNOSIS — I10 Essential (primary) hypertension: Secondary | ICD-10-CM | POA: Diagnosis not present

## 2018-01-10 DIAGNOSIS — M1712 Unilateral primary osteoarthritis, left knee: Secondary | ICD-10-CM | POA: Diagnosis not present

## 2018-01-10 DIAGNOSIS — F329 Major depressive disorder, single episode, unspecified: Secondary | ICD-10-CM | POA: Diagnosis not present

## 2018-01-10 DIAGNOSIS — M8580 Other specified disorders of bone density and structure, unspecified site: Secondary | ICD-10-CM | POA: Diagnosis not present

## 2018-01-10 DIAGNOSIS — M5010 Cervical disc disorder with radiculopathy, unspecified cervical region: Secondary | ICD-10-CM | POA: Diagnosis not present

## 2018-01-10 DIAGNOSIS — R251 Tremor, unspecified: Secondary | ICD-10-CM | POA: Diagnosis not present

## 2018-01-13 DIAGNOSIS — M8580 Other specified disorders of bone density and structure, unspecified site: Secondary | ICD-10-CM | POA: Diagnosis not present

## 2018-01-13 DIAGNOSIS — I10 Essential (primary) hypertension: Secondary | ICD-10-CM | POA: Diagnosis not present

## 2018-01-13 DIAGNOSIS — F329 Major depressive disorder, single episode, unspecified: Secondary | ICD-10-CM | POA: Diagnosis not present

## 2018-01-13 DIAGNOSIS — M5010 Cervical disc disorder with radiculopathy, unspecified cervical region: Secondary | ICD-10-CM | POA: Diagnosis not present

## 2018-01-13 DIAGNOSIS — R251 Tremor, unspecified: Secondary | ICD-10-CM | POA: Diagnosis not present

## 2018-01-13 DIAGNOSIS — M1712 Unilateral primary osteoarthritis, left knee: Secondary | ICD-10-CM | POA: Diagnosis not present

## 2018-01-17 DIAGNOSIS — M8580 Other specified disorders of bone density and structure, unspecified site: Secondary | ICD-10-CM | POA: Diagnosis not present

## 2018-01-17 DIAGNOSIS — M1712 Unilateral primary osteoarthritis, left knee: Secondary | ICD-10-CM | POA: Diagnosis not present

## 2018-01-17 DIAGNOSIS — R251 Tremor, unspecified: Secondary | ICD-10-CM | POA: Diagnosis not present

## 2018-01-17 DIAGNOSIS — F329 Major depressive disorder, single episode, unspecified: Secondary | ICD-10-CM | POA: Diagnosis not present

## 2018-01-17 DIAGNOSIS — Z7901 Long term (current) use of anticoagulants: Secondary | ICD-10-CM | POA: Diagnosis not present

## 2018-01-17 DIAGNOSIS — I10 Essential (primary) hypertension: Secondary | ICD-10-CM | POA: Diagnosis not present

## 2018-01-17 DIAGNOSIS — M5010 Cervical disc disorder with radiculopathy, unspecified cervical region: Secondary | ICD-10-CM | POA: Diagnosis not present

## 2018-01-17 LAB — POCT INR: INR: 1.8 — AB (ref 0.9–1.1)

## 2018-01-17 LAB — PROTIME-INR: Protime: 18.1 — AB (ref 10.0–13.8)

## 2018-01-19 ENCOUNTER — Encounter: Payer: Self-pay | Admitting: Family Medicine

## 2018-01-20 ENCOUNTER — Telehealth: Payer: Self-pay | Admitting: Family Medicine

## 2018-01-20 DIAGNOSIS — M1712 Unilateral primary osteoarthritis, left knee: Secondary | ICD-10-CM | POA: Diagnosis not present

## 2018-01-20 DIAGNOSIS — F329 Major depressive disorder, single episode, unspecified: Secondary | ICD-10-CM | POA: Diagnosis not present

## 2018-01-20 DIAGNOSIS — R251 Tremor, unspecified: Secondary | ICD-10-CM | POA: Diagnosis not present

## 2018-01-20 DIAGNOSIS — M8580 Other specified disorders of bone density and structure, unspecified site: Secondary | ICD-10-CM | POA: Diagnosis not present

## 2018-01-20 DIAGNOSIS — M5010 Cervical disc disorder with radiculopathy, unspecified cervical region: Secondary | ICD-10-CM | POA: Diagnosis not present

## 2018-01-20 DIAGNOSIS — I10 Essential (primary) hypertension: Secondary | ICD-10-CM | POA: Diagnosis not present

## 2018-01-20 MED ORDER — WARFARIN SODIUM 1 MG PO TABS: ORAL_TABLET | ORAL | 3 refills | Status: DC

## 2018-01-20 NOTE — Telephone Encounter (Signed)
Patient's INR reviewed.  It is 1.8.  I contacted the patient and confirmed that she is taking 4 mg of Coumadin on Wednesdays and 3 mg of Coumadin the other days of the week.  We will have her start taking 4 mg of Coumadin Monday, Wednesday, and Friday and 3 mg of Coumadin on Sunday, Tuesday, Thursday, and Saturday.  She verbally confirmed this regimen with me.  She will have her INR checked in 2 weeks.  She confirmed that as well.

## 2018-01-24 DIAGNOSIS — M8580 Other specified disorders of bone density and structure, unspecified site: Secondary | ICD-10-CM | POA: Diagnosis not present

## 2018-01-24 DIAGNOSIS — M5010 Cervical disc disorder with radiculopathy, unspecified cervical region: Secondary | ICD-10-CM | POA: Diagnosis not present

## 2018-01-24 DIAGNOSIS — R251 Tremor, unspecified: Secondary | ICD-10-CM | POA: Diagnosis not present

## 2018-01-24 DIAGNOSIS — I10 Essential (primary) hypertension: Secondary | ICD-10-CM | POA: Diagnosis not present

## 2018-01-24 DIAGNOSIS — M1712 Unilateral primary osteoarthritis, left knee: Secondary | ICD-10-CM | POA: Diagnosis not present

## 2018-01-24 DIAGNOSIS — F329 Major depressive disorder, single episode, unspecified: Secondary | ICD-10-CM | POA: Diagnosis not present

## 2018-01-25 ENCOUNTER — Ambulatory Visit: Payer: Medicare Other | Admitting: Gastroenterology

## 2018-01-28 DIAGNOSIS — I10 Essential (primary) hypertension: Secondary | ICD-10-CM | POA: Diagnosis not present

## 2018-01-28 DIAGNOSIS — R251 Tremor, unspecified: Secondary | ICD-10-CM | POA: Diagnosis not present

## 2018-01-28 DIAGNOSIS — F329 Major depressive disorder, single episode, unspecified: Secondary | ICD-10-CM | POA: Diagnosis not present

## 2018-01-28 DIAGNOSIS — M8580 Other specified disorders of bone density and structure, unspecified site: Secondary | ICD-10-CM | POA: Diagnosis not present

## 2018-01-28 DIAGNOSIS — M5010 Cervical disc disorder with radiculopathy, unspecified cervical region: Secondary | ICD-10-CM | POA: Diagnosis not present

## 2018-01-28 DIAGNOSIS — M1712 Unilateral primary osteoarthritis, left knee: Secondary | ICD-10-CM | POA: Diagnosis not present

## 2018-02-01 ENCOUNTER — Telehealth: Payer: Self-pay

## 2018-02-01 DIAGNOSIS — I10 Essential (primary) hypertension: Secondary | ICD-10-CM | POA: Diagnosis not present

## 2018-02-01 DIAGNOSIS — M8580 Other specified disorders of bone density and structure, unspecified site: Secondary | ICD-10-CM | POA: Diagnosis not present

## 2018-02-01 DIAGNOSIS — M1712 Unilateral primary osteoarthritis, left knee: Secondary | ICD-10-CM | POA: Diagnosis not present

## 2018-02-01 DIAGNOSIS — F329 Major depressive disorder, single episode, unspecified: Secondary | ICD-10-CM | POA: Diagnosis not present

## 2018-02-01 DIAGNOSIS — M5010 Cervical disc disorder with radiculopathy, unspecified cervical region: Secondary | ICD-10-CM | POA: Diagnosis not present

## 2018-02-01 DIAGNOSIS — R251 Tremor, unspecified: Secondary | ICD-10-CM | POA: Diagnosis not present

## 2018-02-01 NOTE — Telephone Encounter (Signed)
Patient notified that she will need this rechecked this week due to medication adjustment with last coumadin lab

## 2018-02-01 NOTE — Telephone Encounter (Signed)
Copied from [place] [id number]. Topic: Quick Communication - Lab Results >> Feb 01, 2018  2:50 PM Lennox Solders wrote: Pt would like to know her coumadin results and the last date it was drawn. Pt would like know the next date she is due to have it recheck

## 2018-02-02 ENCOUNTER — Telehealth: Payer: Self-pay

## 2018-02-02 DIAGNOSIS — Z7901 Long term (current) use of anticoagulants: Secondary | ICD-10-CM | POA: Diagnosis not present

## 2018-02-02 LAB — PROTIME-INR: Protime: 20.6 — AB (ref 10.0–13.8)

## 2018-02-02 LAB — POCT INR: INR: 2.1 — AB (ref 0.9–1.1)

## 2018-02-02 NOTE — Telephone Encounter (Signed)
Noted, will await results.

## 2018-02-02 NOTE — Telephone Encounter (Signed)
Copied from [place] [phone]. Topic: Quick Communication - Lab Results >> Feb 01, 2018  2:50 PM Lennox Solders wrote: Pt would like to know her coumadin results and the last date it was drawn. Pt would like know the next date she is due to have it recheck >> Feb 02, 2018  9:42 AM Yvette Rack wrote: Pt states  that she will be getting her blood work done today at [facility] [address] wanted pt to give her a call back today to tell her what time blood draw is

## 2018-02-03 ENCOUNTER — Encounter: Payer: Self-pay | Admitting: Family Medicine

## 2018-02-03 NOTE — Telephone Encounter (Signed)
Received patients INR from 02/02/18 INR 2.1 Protime is 20.6 Patient is currently taking:  4 mg coumadin on Monday, Wednesday, Friday 3 mg Sunday, Tuesday, Thursday, Saturday

## 2018-02-04 NOTE — Telephone Encounter (Signed)
Call pt INR at goal  Between 2-3.   You may give results to patient  Check again 2 weeks at [facility]- glad is it coming up Stay on current coumadin regimen as [place] outlined.

## 2018-02-04 NOTE — Telephone Encounter (Signed)
Patient notified and verbalized understanding. 

## 2018-02-07 ENCOUNTER — Ambulatory Visit (INDEPENDENT_AMBULATORY_CARE_PROVIDER_SITE_OTHER): Payer: Medicare Other | Admitting: Family Medicine

## 2018-02-07 ENCOUNTER — Encounter: Payer: Self-pay | Admitting: Family Medicine

## 2018-02-07 VITALS — BP 134/78 | HR 67 | Temp 97.6°F | Wt 126.6 lb

## 2018-02-07 DIAGNOSIS — L570 Actinic keratosis: Secondary | ICD-10-CM

## 2018-02-07 DIAGNOSIS — I8393 Asymptomatic varicose veins of bilateral lower extremities: Secondary | ICD-10-CM

## 2018-02-07 DIAGNOSIS — Z7901 Long term (current) use of anticoagulants: Secondary | ICD-10-CM | POA: Diagnosis not present

## 2018-02-07 DIAGNOSIS — H1132 Conjunctival hemorrhage, left eye: Secondary | ICD-10-CM

## 2018-02-07 DIAGNOSIS — Z86711 Personal history of pulmonary embolism: Secondary | ICD-10-CM | POA: Diagnosis not present

## 2018-02-07 DIAGNOSIS — I10 Essential (primary) hypertension: Secondary | ICD-10-CM

## 2018-02-07 DIAGNOSIS — R208 Other disturbances of skin sensation: Secondary | ICD-10-CM

## 2018-02-07 DIAGNOSIS — R6889 Other general symptoms and signs: Secondary | ICD-10-CM

## 2018-02-07 MED ORDER — ALPRAZOLAM 0.5 MG PO TABS: 0.2500 mg | ORAL_TABLET | Freq: Every evening | ORAL | 1 refills | Status: DC | PRN

## 2018-02-07 NOTE — Progress Notes (Signed)
[name], MD Phone: [phone]  [name] is a 84 y.o. female who presents today for f/u.  CC: history of PE, anxiety, htn  History of PE: Currently taking Coumadin.  No chest pain.  Notes her left ankle will swell intermittently for some time.  No new unilateral leg swelling.  The swelling does not extend above her ankle.  No shortness of breath.  Her INR has been well controlled.  Depression: Patient has been taking Xanax for years.  Does not make her drowsy.  She does have a hard time sleeping at times if she does not take it.  She occasionally goes to bed around 1 AM and will wake up early between 6:30 and 7 AM.  Some depression though not that bad.  She stopped the Remeron.  No anxiety.  No SI.  She reports her blood pressure has been good.  She is taking bisoprolol/HCTZ.  She does report some sensation of feeling a little chilly with some dry skin more so than usual.  No weight changes.  She reports a subconjunctival hemorrhage within her left eye.  This occurred about a week ago.  There is no eye pain.  No vision changes.  No head injury.  Social History   Tobacco Use  Smoking Status Former Smoker  . Last attempt to quit: 05/13/1999  . Years since quitting: 18.7  Smokeless Tobacco Never Used     ROS see history of present illness  Objective  Physical Exam Vitals:   02/07/18 1407  BP: 134/78  Pulse: 67  Temp: 97.6 F (36.4 C)  SpO2: 98%    BP Readings from Last 3 Encounters:  02/07/18 134/78  11/01/17 (!) 152/90  10/01/17 (!) 142/90   Wt Readings from Last 3 Encounters:  02/07/18 126 lb 9.6 oz (57.4 kg)  11/01/17 126 lb 9.6 oz (57.4 kg)  10/01/17 125 lb 9.6 oz (57 kg)    Physical Exam  Constitutional: No distress.  Eyes: Pupils are equal, round, and reactive to light.  Subconjunctival hemorrhage on left that appears to be resolving  Cardiovascular: Normal rate, regular rhythm and normal heart sounds.  Pulmonary/Chest: Effort normal and  breath sounds normal.  Musculoskeletal:  Left ankle mildly puffy compared to the right, no swelling above the ankles, no pitting edema, does have fairly significant varicose veins in her bilateral ankles and feet  Neurological: She is alert.  Skin: Skin is warm and dry. She is not diaphoretic.     Assessment/Plan: Please see individual problem list.  Varicose veins of both lower extremities I suspect the left ankle swelling is likely related to her varicose veins or possibly arthritis.  This is not a new issue.  She will monitor and if worsens or if she develops increasing swelling in one leg greater than the other she will be evaluated.  Hypertension Continue current regimen.  History of pulmonary embolus (PE) Continue Coumadin.  Monitor for symptoms.  Cold intolerance Has had a little chilliness with some dry skin.  Prior TSH normal.  Most recent CBC normal.  She will monitor.  Subconjunctival hemorrhage of left eye Appears to be resolving.  No vision changes.  Vision checked today and similar to prior based on requested records from [facility].  She is given return precautions.   Orders Placed This Encounter  Procedures  . Ambulatory referral to Dermatology    Referral Priority:   Routine    Referral Type:   Consultation    Referral Reason:  Specialty Services Required    Requested Specialty:   Dermatology    Number of Visits Requested:   1    Meds ordered this encounter  Medications  . ALPRAZolam (XANAX) 0.5 MG tablet    Sig: Take 0.5 tablets (0.25 mg total) by mouth at bedtime as needed for anxiety or sleep.    Dispense:  30 tablet    Refill:  [place], MD [place]

## 2018-02-07 NOTE — Patient Instructions (Signed)
Nice to see you. Please get the lab work completed. We will get you to see dermatology. Please let us know if you get drowsy with the Xanax.

## 2018-02-08 DIAGNOSIS — K529 Noninfective gastroenteritis and colitis, unspecified: Secondary | ICD-10-CM | POA: Diagnosis not present

## 2018-02-10 DIAGNOSIS — I10 Essential (primary) hypertension: Secondary | ICD-10-CM | POA: Diagnosis not present

## 2018-02-10 DIAGNOSIS — M1712 Unilateral primary osteoarthritis, left knee: Secondary | ICD-10-CM | POA: Diagnosis not present

## 2018-02-10 DIAGNOSIS — F329 Major depressive disorder, single episode, unspecified: Secondary | ICD-10-CM | POA: Diagnosis not present

## 2018-02-10 DIAGNOSIS — R251 Tremor, unspecified: Secondary | ICD-10-CM | POA: Diagnosis not present

## 2018-02-10 DIAGNOSIS — M8580 Other specified disorders of bone density and structure, unspecified site: Secondary | ICD-10-CM | POA: Diagnosis not present

## 2018-02-10 DIAGNOSIS — M5010 Cervical disc disorder with radiculopathy, unspecified cervical region: Secondary | ICD-10-CM | POA: Diagnosis not present

## 2018-02-13 DIAGNOSIS — H1132 Conjunctival hemorrhage, left eye: Secondary | ICD-10-CM | POA: Insufficient documentation

## 2018-02-13 NOTE — Assessment & Plan Note (Signed)
I suspect the left ankle swelling is likely related to her varicose veins or possibly arthritis.  This is not a new issue.  She will monitor and if worsens or if she develops increasing swelling in one leg greater than the other she will be evaluated.

## 2018-02-13 NOTE — Assessment & Plan Note (Signed)
Appears to be resolving.  No vision changes.  Vision checked today and similar to prior based on requested records from [facility].  She is given return precautions.

## 2018-02-13 NOTE — Assessment & Plan Note (Signed)
Has had a little chilliness with some dry skin.  Prior TSH normal.  Most recent CBC normal.  She will monitor.

## 2018-02-13 NOTE — Assessment & Plan Note (Signed)
Continue current regimen

## 2018-02-13 NOTE — Assessment & Plan Note (Signed)
Continue Coumadin.  Monitor for symptoms.

## 2018-02-15 DIAGNOSIS — M1712 Unilateral primary osteoarthritis, left knee: Secondary | ICD-10-CM | POA: Diagnosis not present

## 2018-02-15 DIAGNOSIS — R251 Tremor, unspecified: Secondary | ICD-10-CM | POA: Diagnosis not present

## 2018-02-15 DIAGNOSIS — F329 Major depressive disorder, single episode, unspecified: Secondary | ICD-10-CM | POA: Diagnosis not present

## 2018-02-15 DIAGNOSIS — M8580 Other specified disorders of bone density and structure, unspecified site: Secondary | ICD-10-CM | POA: Diagnosis not present

## 2018-02-15 DIAGNOSIS — I10 Essential (primary) hypertension: Secondary | ICD-10-CM | POA: Diagnosis not present

## 2018-02-15 DIAGNOSIS — M5010 Cervical disc disorder with radiculopathy, unspecified cervical region: Secondary | ICD-10-CM | POA: Diagnosis not present

## 2018-02-17 DIAGNOSIS — F329 Major depressive disorder, single episode, unspecified: Secondary | ICD-10-CM | POA: Diagnosis not present

## 2018-02-17 DIAGNOSIS — M8580 Other specified disorders of bone density and structure, unspecified site: Secondary | ICD-10-CM | POA: Diagnosis not present

## 2018-02-17 DIAGNOSIS — I10 Essential (primary) hypertension: Secondary | ICD-10-CM | POA: Diagnosis not present

## 2018-02-17 DIAGNOSIS — M1712 Unilateral primary osteoarthritis, left knee: Secondary | ICD-10-CM | POA: Diagnosis not present

## 2018-02-17 DIAGNOSIS — R251 Tremor, unspecified: Secondary | ICD-10-CM | POA: Diagnosis not present

## 2018-02-17 DIAGNOSIS — M5010 Cervical disc disorder with radiculopathy, unspecified cervical region: Secondary | ICD-10-CM | POA: Diagnosis not present

## 2018-02-21 DIAGNOSIS — Z7901 Long term (current) use of anticoagulants: Secondary | ICD-10-CM | POA: Diagnosis not present

## 2018-02-21 LAB — PROTIME-INR: Protime: 25.9 — AB (ref 10.0–13.8)

## 2018-02-21 LAB — POCT INR: INR: 2.6 — AB (ref 0.9–1.1)

## 2018-02-22 ENCOUNTER — Telehealth: Payer: Self-pay

## 2018-02-22 ENCOUNTER — Encounter: Payer: Self-pay | Admitting: Family Medicine

## 2018-02-22 DIAGNOSIS — M1712 Unilateral primary osteoarthritis, left knee: Secondary | ICD-10-CM | POA: Diagnosis not present

## 2018-02-22 DIAGNOSIS — R251 Tremor, unspecified: Secondary | ICD-10-CM | POA: Diagnosis not present

## 2018-02-22 DIAGNOSIS — M5010 Cervical disc disorder with radiculopathy, unspecified cervical region: Secondary | ICD-10-CM | POA: Diagnosis not present

## 2018-02-22 DIAGNOSIS — I10 Essential (primary) hypertension: Secondary | ICD-10-CM | POA: Diagnosis not present

## 2018-02-22 DIAGNOSIS — M8580 Other specified disorders of bone density and structure, unspecified site: Secondary | ICD-10-CM | POA: Diagnosis not present

## 2018-02-22 DIAGNOSIS — F329 Major depressive disorder, single episode, unspecified: Secondary | ICD-10-CM | POA: Diagnosis not present

## 2018-02-22 NOTE — Telephone Encounter (Signed)
Received patients INR and protime from [facility].  INR 2.36 Protime 25.9

## 2018-02-22 NOTE — Telephone Encounter (Signed)
Patient called and states that she no longer wants to take the Letrozole medication. She has stopped this medication about a month ago and just wanted to let us know this. She will see [place] back in December.

## 2018-02-22 NOTE — Telephone Encounter (Signed)
INR is acceptable.  Continue current regimen.  Check in 4 weeks.

## 2018-02-22 NOTE — Telephone Encounter (Signed)
Patient notified and verbalized understanding. 

## 2018-02-28 DIAGNOSIS — M8580 Other specified disorders of bone density and structure, unspecified site: Secondary | ICD-10-CM | POA: Diagnosis not present

## 2018-02-28 DIAGNOSIS — F329 Major depressive disorder, single episode, unspecified: Secondary | ICD-10-CM | POA: Diagnosis not present

## 2018-02-28 DIAGNOSIS — R251 Tremor, unspecified: Secondary | ICD-10-CM | POA: Diagnosis not present

## 2018-02-28 DIAGNOSIS — I10 Essential (primary) hypertension: Secondary | ICD-10-CM | POA: Diagnosis not present

## 2018-02-28 DIAGNOSIS — M5010 Cervical disc disorder with radiculopathy, unspecified cervical region: Secondary | ICD-10-CM | POA: Diagnosis not present

## 2018-02-28 DIAGNOSIS — M1712 Unilateral primary osteoarthritis, left knee: Secondary | ICD-10-CM | POA: Diagnosis not present

## 2018-03-06 DIAGNOSIS — M8580 Other specified disorders of bone density and structure, unspecified site: Secondary | ICD-10-CM | POA: Diagnosis not present

## 2018-03-06 DIAGNOSIS — I10 Essential (primary) hypertension: Secondary | ICD-10-CM | POA: Diagnosis not present

## 2018-03-06 DIAGNOSIS — R251 Tremor, unspecified: Secondary | ICD-10-CM | POA: Diagnosis not present

## 2018-03-06 DIAGNOSIS — M5010 Cervical disc disorder with radiculopathy, unspecified cervical region: Secondary | ICD-10-CM | POA: Diagnosis not present

## 2018-03-06 DIAGNOSIS — M1712 Unilateral primary osteoarthritis, left knee: Secondary | ICD-10-CM | POA: Diagnosis not present

## 2018-03-06 DIAGNOSIS — F329 Major depressive disorder, single episode, unspecified: Secondary | ICD-10-CM | POA: Diagnosis not present

## 2018-03-07 DIAGNOSIS — M1712 Unilateral primary osteoarthritis, left knee: Secondary | ICD-10-CM | POA: Diagnosis not present

## 2018-03-07 DIAGNOSIS — M5010 Cervical disc disorder with radiculopathy, unspecified cervical region: Secondary | ICD-10-CM | POA: Diagnosis not present

## 2018-03-07 DIAGNOSIS — M8580 Other specified disorders of bone density and structure, unspecified site: Secondary | ICD-10-CM | POA: Diagnosis not present

## 2018-03-07 DIAGNOSIS — R251 Tremor, unspecified: Secondary | ICD-10-CM | POA: Diagnosis not present

## 2018-03-07 DIAGNOSIS — F329 Major depressive disorder, single episode, unspecified: Secondary | ICD-10-CM | POA: Diagnosis not present

## 2018-03-07 DIAGNOSIS — I10 Essential (primary) hypertension: Secondary | ICD-10-CM | POA: Diagnosis not present

## 2018-03-09 DIAGNOSIS — M5412 Radiculopathy, cervical region: Secondary | ICD-10-CM | POA: Diagnosis not present

## 2018-03-09 DIAGNOSIS — M5030 Other cervical disc degeneration, unspecified cervical region: Secondary | ICD-10-CM | POA: Diagnosis not present

## 2018-03-10 DIAGNOSIS — F329 Major depressive disorder, single episode, unspecified: Secondary | ICD-10-CM | POA: Diagnosis not present

## 2018-03-10 DIAGNOSIS — M8580 Other specified disorders of bone density and structure, unspecified site: Secondary | ICD-10-CM | POA: Diagnosis not present

## 2018-03-10 DIAGNOSIS — I10 Essential (primary) hypertension: Secondary | ICD-10-CM | POA: Diagnosis not present

## 2018-03-10 DIAGNOSIS — M1712 Unilateral primary osteoarthritis, left knee: Secondary | ICD-10-CM | POA: Diagnosis not present

## 2018-03-10 DIAGNOSIS — R251 Tremor, unspecified: Secondary | ICD-10-CM | POA: Diagnosis not present

## 2018-03-10 DIAGNOSIS — M5010 Cervical disc disorder with radiculopathy, unspecified cervical region: Secondary | ICD-10-CM | POA: Diagnosis not present

## 2018-03-15 DIAGNOSIS — R251 Tremor, unspecified: Secondary | ICD-10-CM | POA: Diagnosis not present

## 2018-03-15 DIAGNOSIS — M8580 Other specified disorders of bone density and structure, unspecified site: Secondary | ICD-10-CM | POA: Diagnosis not present

## 2018-03-15 DIAGNOSIS — M1712 Unilateral primary osteoarthritis, left knee: Secondary | ICD-10-CM | POA: Diagnosis not present

## 2018-03-15 DIAGNOSIS — F329 Major depressive disorder, single episode, unspecified: Secondary | ICD-10-CM | POA: Diagnosis not present

## 2018-03-15 DIAGNOSIS — M5010 Cervical disc disorder with radiculopathy, unspecified cervical region: Secondary | ICD-10-CM | POA: Diagnosis not present

## 2018-03-15 DIAGNOSIS — I10 Essential (primary) hypertension: Secondary | ICD-10-CM | POA: Diagnosis not present

## 2018-03-18 DIAGNOSIS — M5010 Cervical disc disorder with radiculopathy, unspecified cervical region: Secondary | ICD-10-CM | POA: Diagnosis not present

## 2018-03-18 DIAGNOSIS — M1712 Unilateral primary osteoarthritis, left knee: Secondary | ICD-10-CM | POA: Diagnosis not present

## 2018-03-18 DIAGNOSIS — F329 Major depressive disorder, single episode, unspecified: Secondary | ICD-10-CM | POA: Diagnosis not present

## 2018-03-18 DIAGNOSIS — R251 Tremor, unspecified: Secondary | ICD-10-CM | POA: Diagnosis not present

## 2018-03-18 DIAGNOSIS — M8580 Other specified disorders of bone density and structure, unspecified site: Secondary | ICD-10-CM | POA: Diagnosis not present

## 2018-03-18 DIAGNOSIS — I10 Essential (primary) hypertension: Secondary | ICD-10-CM | POA: Diagnosis not present

## 2018-03-21 DIAGNOSIS — M8580 Other specified disorders of bone density and structure, unspecified site: Secondary | ICD-10-CM | POA: Diagnosis not present

## 2018-03-21 DIAGNOSIS — R251 Tremor, unspecified: Secondary | ICD-10-CM | POA: Diagnosis not present

## 2018-03-21 DIAGNOSIS — M1712 Unilateral primary osteoarthritis, left knee: Secondary | ICD-10-CM | POA: Diagnosis not present

## 2018-03-21 DIAGNOSIS — M5010 Cervical disc disorder with radiculopathy, unspecified cervical region: Secondary | ICD-10-CM | POA: Diagnosis not present

## 2018-03-21 DIAGNOSIS — I10 Essential (primary) hypertension: Secondary | ICD-10-CM | POA: Diagnosis not present

## 2018-03-21 DIAGNOSIS — F329 Major depressive disorder, single episode, unspecified: Secondary | ICD-10-CM | POA: Diagnosis not present

## 2018-03-25 DIAGNOSIS — Z7901 Long term (current) use of anticoagulants: Secondary | ICD-10-CM | POA: Diagnosis not present

## 2018-03-25 LAB — PROTIME-INR: Protime: 21.4 — AB (ref 10.0–13.8)

## 2018-03-25 LAB — POCT INR: INR: 2.2 — AB (ref 0.9–1.1)

## 2018-03-29 ENCOUNTER — Telehealth: Payer: Self-pay

## 2018-03-29 DIAGNOSIS — F329 Major depressive disorder, single episode, unspecified: Secondary | ICD-10-CM | POA: Diagnosis not present

## 2018-03-29 DIAGNOSIS — M5010 Cervical disc disorder with radiculopathy, unspecified cervical region: Secondary | ICD-10-CM | POA: Diagnosis not present

## 2018-03-29 DIAGNOSIS — I10 Essential (primary) hypertension: Secondary | ICD-10-CM | POA: Diagnosis not present

## 2018-03-29 DIAGNOSIS — M8580 Other specified disorders of bone density and structure, unspecified site: Secondary | ICD-10-CM | POA: Diagnosis not present

## 2018-03-29 DIAGNOSIS — M1712 Unilateral primary osteoarthritis, left knee: Secondary | ICD-10-CM | POA: Diagnosis not present

## 2018-03-29 DIAGNOSIS — R251 Tremor, unspecified: Secondary | ICD-10-CM | POA: Diagnosis not present

## 2018-03-29 NOTE — Telephone Encounter (Signed)
Please let the patient know that her INR is acceptable.  She should continue with her current Coumadin regimen and we should recheck this in 1 month.  Thanks.

## 2018-03-29 NOTE — Telephone Encounter (Signed)
Received INR results [facility] 03/25/18  INR 2.2 PT 21.4.

## 2018-03-30 ENCOUNTER — Ambulatory Visit: Payer: Self-pay | Admitting: Family Medicine
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
## 2018-03-30 NOTE — Telephone Encounter (Signed)
Documentation reviewed. She will need to be seen to determine if antibiotic therapy is appropriate. Please offer her an appointment if she is willing to come in. We could also offer her to establish care with one of the other providers in the office.

## 2018-03-30 NOTE — Telephone Encounter (Signed)
Left voice mail to call back ok for P[facility] to speak to patient, see below message

## 2018-03-30 NOTE — Telephone Encounter (Signed)
Please advise 

## 2018-03-30 NOTE — Telephone Encounter (Signed)
She called in to get the message from Dr. [name] that her INR was fine and to continue her current Coumadin regimen.   She was so happy and verbalized understanding. She then started telling me about what she needed to do, all the appointments she has, etc.    She then said I need Dr. [name] to give me a months supply of Microbid.   When I asked her why she said,  "Well my urine is uncomfortable".    "I saw a doc in the box 5 months ago and he prescribed microbid for my urine problem and I need Dr. [name] to order that for me".   I explained to her that Dr. [name] will need to see her and a urine sample would be needed to determine what kind of antibiotic she would need.   She stated,  "I don't want to see him".   "I don't think he is up to par".    "He doesn't order anything that I ask him to".   "He even would not order my chemo medicine because he said he wasn't comfortable with that".   I explained to her that the oncologists are the ones that manage the chemo regimens.   She went on to tell me about all of her appts coming up when I offered to make her an appt with another provider.   I was never able to finish triaging her for the UTI or get her to let me schedule her with another provider.    She just kept talking about all the things going on with her. I finally had to end the phone call with I'm glad I could give you good news about your INR level and we will talk with you next month at your next INR check.    She said ok.     Reason for Disposition . Age > 50 years  Answer Assessment - Initial Assessment Questions 1. SEVERITY: "How bad is the pain?"  (e.g., Scale 1-10; mild, moderate, or severe)   - MILD (1-3): complains slightly about urination hurting   - MODERATE (4-7): interferes with normal activities     - SEVERE (8-10): excruciating, unwilling or unable to urinate because of the pain      I don't want to come in and see Dr. [name].   I don't think he is up to par.   I don't mean to be mean.   I ask him to order things for me and he doesn't.   I'm in the middle of dental surgery right now.   Friday I have to go to the cancer place in town here.    I have to sign up for transportation.   It's $25 added to my bill when I get transportation.    2. FREQUENCY: "How many times have you had painful urination today?"      It's very uncomfortable.   3. PATTERN: "Is pain present every time you urinate or just sometimes?"      *No Answer* 4. ONSET: "When did the painful urination start?"      Unable to get an answer from her.   5. FEVER: "Do you have a fever?" If so, ask: "What is your temperature, how was it measured, and when did it start?"     *No Answer* 6. PAST UTI: "Have you had a urine infection before?" If so, ask: "When was the last time?" and "What happened that time?"  Yes  5 months ago.   7. CAUSE: "What do you think is causing the painful urination?"  (e.g., UTI, scratch, Herpes sore)     I wasn't able to get any more answers from her.   She kept telling me about all these appts she has and all the things she needs to do and about falling apart as you get older.   I finally ended the conversation. 8. OTHER SYMPTOMS: "Do you have any other symptoms?" (e.g., flank pain, vaginal discharge, genital sores, urgency, blood in urine)     *No Answer* 9. PREGNANCY: "Is there any chance you are pregnant?" "When was your last menstrual period?"     *No Answer*  Protocols used: [place]

## 2018-03-30 NOTE — Telephone Encounter (Signed)
Tried calling patient number busy.  

## 2018-03-31 DIAGNOSIS — F329 Major depressive disorder, single episode, unspecified: Secondary | ICD-10-CM | POA: Diagnosis not present

## 2018-03-31 DIAGNOSIS — I10 Essential (primary) hypertension: Secondary | ICD-10-CM | POA: Diagnosis not present

## 2018-03-31 DIAGNOSIS — M8580 Other specified disorders of bone density and structure, unspecified site: Secondary | ICD-10-CM | POA: Diagnosis not present

## 2018-03-31 DIAGNOSIS — M5010 Cervical disc disorder with radiculopathy, unspecified cervical region: Secondary | ICD-10-CM | POA: Diagnosis not present

## 2018-03-31 DIAGNOSIS — R251 Tremor, unspecified: Secondary | ICD-10-CM | POA: Diagnosis not present

## 2018-03-31 DIAGNOSIS — M1712 Unilateral primary osteoarthritis, left knee: Secondary | ICD-10-CM | POA: Diagnosis not present

## 2018-03-31 NOTE — Telephone Encounter (Signed)
Patient advised of results and verbalized understanding.  

## 2018-04-01 DIAGNOSIS — R319 Hematuria, unspecified: Secondary | ICD-10-CM | POA: Diagnosis not present

## 2018-04-01 DIAGNOSIS — N390 Urinary tract infection, site not specified: Secondary | ICD-10-CM | POA: Diagnosis not present

## 2018-04-01 DIAGNOSIS — R300 Dysuria: Secondary | ICD-10-CM | POA: Diagnosis not present

## 2018-04-05 DIAGNOSIS — I10 Essential (primary) hypertension: Secondary | ICD-10-CM | POA: Diagnosis not present

## 2018-04-05 DIAGNOSIS — M1712 Unilateral primary osteoarthritis, left knee: Secondary | ICD-10-CM | POA: Diagnosis not present

## 2018-04-05 DIAGNOSIS — M8580 Other specified disorders of bone density and structure, unspecified site: Secondary | ICD-10-CM | POA: Diagnosis not present

## 2018-04-05 DIAGNOSIS — M5010 Cervical disc disorder with radiculopathy, unspecified cervical region: Secondary | ICD-10-CM | POA: Diagnosis not present

## 2018-04-05 DIAGNOSIS — R251 Tremor, unspecified: Secondary | ICD-10-CM | POA: Diagnosis not present

## 2018-04-05 DIAGNOSIS — F329 Major depressive disorder, single episode, unspecified: Secondary | ICD-10-CM | POA: Diagnosis not present

## 2018-04-07 DIAGNOSIS — R251 Tremor, unspecified: Secondary | ICD-10-CM | POA: Diagnosis not present

## 2018-04-07 DIAGNOSIS — F329 Major depressive disorder, single episode, unspecified: Secondary | ICD-10-CM | POA: Diagnosis not present

## 2018-04-07 DIAGNOSIS — M1712 Unilateral primary osteoarthritis, left knee: Secondary | ICD-10-CM | POA: Diagnosis not present

## 2018-04-07 DIAGNOSIS — M5010 Cervical disc disorder with radiculopathy, unspecified cervical region: Secondary | ICD-10-CM | POA: Diagnosis not present

## 2018-04-07 DIAGNOSIS — I10 Essential (primary) hypertension: Secondary | ICD-10-CM | POA: Diagnosis not present

## 2018-04-07 DIAGNOSIS — M8580 Other specified disorders of bone density and structure, unspecified site: Secondary | ICD-10-CM | POA: Diagnosis not present

## 2018-04-12 DIAGNOSIS — I10 Essential (primary) hypertension: Secondary | ICD-10-CM | POA: Diagnosis not present

## 2018-04-12 DIAGNOSIS — M8580 Other specified disorders of bone density and structure, unspecified site: Secondary | ICD-10-CM | POA: Diagnosis not present

## 2018-04-12 DIAGNOSIS — M5010 Cervical disc disorder with radiculopathy, unspecified cervical region: Secondary | ICD-10-CM | POA: Diagnosis not present

## 2018-04-12 DIAGNOSIS — M1712 Unilateral primary osteoarthritis, left knee: Secondary | ICD-10-CM | POA: Diagnosis not present

## 2018-04-12 DIAGNOSIS — R251 Tremor, unspecified: Secondary | ICD-10-CM | POA: Diagnosis not present

## 2018-04-12 DIAGNOSIS — F329 Major depressive disorder, single episode, unspecified: Secondary | ICD-10-CM | POA: Diagnosis not present

## 2018-04-14 DIAGNOSIS — M8580 Other specified disorders of bone density and structure, unspecified site: Secondary | ICD-10-CM | POA: Diagnosis not present

## 2018-04-14 DIAGNOSIS — M1712 Unilateral primary osteoarthritis, left knee: Secondary | ICD-10-CM | POA: Diagnosis not present

## 2018-04-14 DIAGNOSIS — F329 Major depressive disorder, single episode, unspecified: Secondary | ICD-10-CM | POA: Diagnosis not present

## 2018-04-14 DIAGNOSIS — R251 Tremor, unspecified: Secondary | ICD-10-CM | POA: Diagnosis not present

## 2018-04-14 DIAGNOSIS — I10 Essential (primary) hypertension: Secondary | ICD-10-CM | POA: Diagnosis not present

## 2018-04-14 DIAGNOSIS — M5010 Cervical disc disorder with radiculopathy, unspecified cervical region: Secondary | ICD-10-CM | POA: Diagnosis not present

## 2018-04-19 DIAGNOSIS — I10 Essential (primary) hypertension: Secondary | ICD-10-CM | POA: Diagnosis not present

## 2018-04-19 DIAGNOSIS — R251 Tremor, unspecified: Secondary | ICD-10-CM | POA: Diagnosis not present

## 2018-04-19 DIAGNOSIS — M1712 Unilateral primary osteoarthritis, left knee: Secondary | ICD-10-CM | POA: Diagnosis not present

## 2018-04-19 DIAGNOSIS — F329 Major depressive disorder, single episode, unspecified: Secondary | ICD-10-CM | POA: Diagnosis not present

## 2018-04-19 DIAGNOSIS — M5010 Cervical disc disorder with radiculopathy, unspecified cervical region: Secondary | ICD-10-CM | POA: Diagnosis not present

## 2018-04-19 DIAGNOSIS — M8580 Other specified disorders of bone density and structure, unspecified site: Secondary | ICD-10-CM | POA: Diagnosis not present

## 2018-04-21 DIAGNOSIS — M1712 Unilateral primary osteoarthritis, left knee: Secondary | ICD-10-CM | POA: Diagnosis not present

## 2018-04-21 DIAGNOSIS — R251 Tremor, unspecified: Secondary | ICD-10-CM | POA: Diagnosis not present

## 2018-04-21 DIAGNOSIS — F329 Major depressive disorder, single episode, unspecified: Secondary | ICD-10-CM | POA: Diagnosis not present

## 2018-04-21 DIAGNOSIS — M8580 Other specified disorders of bone density and structure, unspecified site: Secondary | ICD-10-CM | POA: Diagnosis not present

## 2018-04-21 DIAGNOSIS — M5010 Cervical disc disorder with radiculopathy, unspecified cervical region: Secondary | ICD-10-CM | POA: Diagnosis not present

## 2018-04-21 DIAGNOSIS — I10 Essential (primary) hypertension: Secondary | ICD-10-CM | POA: Diagnosis not present

## 2018-04-22 ENCOUNTER — Ambulatory Visit (INDEPENDENT_AMBULATORY_CARE_PROVIDER_SITE_OTHER): Payer: Medicare Other

## 2018-04-22 VITALS — BP 122/72 | HR 70 | Temp 97.3°F | Resp 16 | Ht 58.5 in | Wt 127.4 lb

## 2018-04-22 DIAGNOSIS — Z23 Encounter for immunization: Secondary | ICD-10-CM | POA: Diagnosis not present

## 2018-04-22 DIAGNOSIS — Z0000 Encounter for general adult medical examination without abnormal findings: Secondary | ICD-10-CM | POA: Diagnosis not present

## 2018-04-22 DIAGNOSIS — Z7901 Long term (current) use of anticoagulants: Secondary | ICD-10-CM

## 2018-04-22 LAB — PROTIME-INR
INR: 2.5 ratio — ABNORMAL HIGH (ref 0.8–1.0)
Prothrombin Time: 29.2 s — ABNORMAL HIGH (ref 9.6–13.1)

## 2018-04-22 NOTE — Patient Instructions (Addendum)
  [name] , Thank you for taking time to come for your Medicare Wellness Visit. I appreciate your ongoing commitment to your health goals. Please review the following plan we discussed and let me know if I can assist you in the future.   Follow up as needed.    Bring a copy of your [place] and/or Living Will to be scanned into chart.  Have a great day!  These are the goals we discussed: Goals    . Increase physical activity     Walk with cane for stability. Walk for exercise.     . Increase water intake       This is a list of the screening recommended for you and due dates:  Health Maintenance  Topic Date Due  . Tetanus Vaccine  03/12/2021  . Flu Shot  Completed  . DEXA scan (bone density measurement)  Completed  . Pneumonia vaccines  Completed

## 2018-04-22 NOTE — Progress Notes (Signed)
Subjective:   Nalla Purdy is a 84 y.o. female who presents for Medicare Annual (Subsequent) preventive examination.  Review of Systems:  No ROS.  Medicare Wellness Visit. Additional risk factors are reflected in the social history.  Cardiac Risk Factors include: advanced age (>76mn, >>65women);hypertension     Objective:     Vitals: BP 122/72 (BP Location: Right Arm, Patient Position: Sitting, Cuff Size: Normal)   Pulse 70   Temp (!) 97.3 F (36.3 C) (Oral)   Resp 16   Ht 4' 10.5" (1.486 m)   Wt 127 lb 6.4 oz (57.8 kg)   SpO2 97%   BMI 26.17 kg/m   Body mass index is 26.17 kg/m.  Advanced Directives 04/22/2018 04/21/2017  Does Patient Have a Medical Advance Directive? Yes Yes  Type of [place]  Does patient want to make changes to medical advance directive? No - Patient declined No - Patient declined  Copy of [place] Chart? No - copy requested No - copy requested    Tobacco Social History   Tobacco Use  Smoking Status Former Smoker  . Last attempt to quit: 05/13/1999  . Years since quitting: 18.9  Smokeless Tobacco Never Used     Counseling given: Not Answered   Clinical Intake:  Pre-visit preparation completed: Yes  Pain : No/denies pain     Nutritional Status: BMI 25 -29 Overweight Diabetes: No  How often do you need to have someone help you when you read instructions, pamphlets, or other written materials from your doctor or pharmacy?: 1 - Never  Interpreter Needed?: No     Past Medical History:  Diagnosis Date  . Allergy   . Breast cancer ([place] 07/22/2012   T1c, Nx carcinoma left breast, ER 90%, PR 90%, HER-2/neu not over expressed.  . Hypertension   . Insomnia   . Osteoporosis    Prolia 10/2012  . Pulmonary embolus ([place] 2009  . S/P IVC filter 2009   Past Surgical History:  Procedure Laterality Date  . BLADDER SURGERY      . BREAST SURGERY Left 2013   mastectomy  . CHOLECYSTECTOMY    . HERNIA REPAIR    . MASTECTOMY Left 2013   BREAST CA  . REPLACEMENT TOTAL KNEE     right  . TONSILLECTOMY     Family History  Problem Relation Age of Onset  . COPD Mother   . Stroke Father   . Cancer Father        breast  . Breast cancer Maternal Aunt    Social History   Socioeconomic History  . Marital status: Widowed    Spouse name: Not on file  . Number of children: Not on file  . Years of education: Not on file  . Highest education level: Not on file  Occupational History  . Not on file  Social Needs  . Financial resource strain: Not hard at all  . Food insecurity:    Worry: Never true    Inability: Never true  . Transportation needs:    Medical: No    Non-medical: No  Tobacco Use  . Smoking status: Former Smoker    Last attempt to quit: 05/13/1999    Years since quitting: 18.9  . Smokeless tobacco: Never Used  Substance and Sexual Activity  . Alcohol use: No  . Drug use: No  . Sexual activity: Not on file  Lifestyle  .  Physical activity:    Days per week: 0 days    Minutes per session: Not on file  . Stress: Not at all  Relationships  . Social connections:    Talks on phone: Not on file    Gets together: Not on file    Attends religious service: Not on file    Active member of club or organization: Not on file    Attends meetings of clubs or organizations: Not on file    Relationship status: Not on file  Other Topics Concern  . Not on file  Social History Narrative   Lives at [facility] at [place]. Has 2 sons. Widow.    Outpatient Encounter Medications as of 04/22/2018  Medication Sig  . ALPRAZolam (XANAX) 0.5 MG tablet Take 0.5 tablets (0.25 mg total) by mouth at bedtime as needed for anxiety or sleep.  . baclofen (LIORESAL) 10 MG tablet Take 0.5 tablets (5 mg total) by mouth 3 (three) times daily as needed for muscle spasms.  . Biotin 10 MG TABS Take by mouth.  .  bisoprolol-hydrochlorothiazide (ZIAC) 5-6.25 MG tablet Take 1 tablet by mouth daily.  . Cholecalciferol (VITAMIN D PO) Take 2,000 mg by mouth daily.  Marland Kitchen denosumab (PROLIA) 60 MG/ML SOSY injection Prolia 60 mg/mL subcutaneous syringe  Inject 1 mL by subcutaneous route.  . diphenoxylate-atropine (LOMOTIL) 2.5-0.025 MG tablet Take 1 tablet by mouth 4 (four) times daily as needed for diarrhea or loose stools.  . fluticasone (FLONASE) 50 MCG/ACT nasal spray Place 2 sprays into both nostrils daily.  . mirtazapine (REMERON) 7.5 MG tablet Take 1 tablet (7.5 mg total) by mouth at bedtime.  Marland Kitchen warfarin (COUMADIN) 1 MG tablet Take 1 mg by mouth on Monday, Wednesday, and Friday  . warfarin (COUMADIN) 3 MG tablet TAKE 1 TABLET DAILY  . XIIDRA 5 % SOLN    No facility-administered encounter medications on file as of 04/22/2018.     Activities of Daily Living In your present state of health, do you have any difficulty performing the following activities: 04/22/2018  Hearing? Y  Comment Hearing aid, bilateral  Vision? N  Difficulty concentrating or making decisions? Y  Walking or climbing stairs? Y  Comment Unsteady gait. Uses cane as needed.  Dressing or bathing? N  Doing errands, shopping? N  Preparing Food and eating ? N  Using the Toilet? N  In the past six months, have you accidently leaked urine? N  Do you have problems with loss of bowel control? N  Managing your Medications? N  Managing your Finances? N  Housekeeping or managing your Housekeeping? N  Some recent data might be hidden    Patient Care Team: [name], MD as PCP - General (Family Medicine) Bary Castilla, Forest Gleason, MD (General Surgery) Jackolyn Confer, MD (Internal Medicine)    Assessment:   This is a routine wellness examination for Ladye.  The goal of the wellness visit is to assist the patient how to close the gaps in care and create a preventative care plan for the patient.   The roster of all physicians  providing medical care to patient is listed in the Snapshot section of the chart.  Taking calcium VIT D as appropriate/Osteoporosis reviewed.   Prolia injections currently administered every 6 months.  Safety issues reviewed; Smoke and carbon monoxide detectors in the home. No firearms in the home. Wears seatbelts when driving or riding with others. No violence in the home.  They do not have excessive sun  exposure.  Discussed the need for sun protection: hats, long sleeves and the use of sunscreen if there is significant sun exposure.  Patient is alert, normal appearance, oriented to person/place/and time.  Correctly identified the president of the [place] and recalls of 3/3 words.  No new identified risk were noted.  No failures at ADL's or IADL's.  Ambulates with cane sometimes. No cane today. Gait steady. Notes feeling more comfortable with cane in use. Currently in physical therapy two times per week, to aid with walking with a cane.  Encouraged to use daily.   BMI- discussed the importance of a healthy diet, water intake and the benefits of aerobic exercise.She cooks her own food most of the time and tries to have a healthy diet. Increase of water intake and physical activity encouraged.   24 hour diet recall: Regular diet. Little to no fluid intake. Fluids encouraged.   Dental- dentures.   Sleep patterns- Sleeps 6 hours at night.    PT/INR lab drawn per patient request.    High dose influenza vaccine administered R deltoid, tolerated well. No verbal complaint during or after administration. Educational material provided.  Health maintenance gaps- closed.  Patient Concerns: None at this time. Follow up with PCP as needed.  Exercise Activities and Dietary recommendations Current Exercise Habits: Structured exercise class, Frequency (Times/Week): 2, Intensity: Mild  Goals    . Increase physical activity     Walk with cane for stability. Walk for exercise.     . Increase  water intake       Fall Risk Fall Risk  04/22/2018 09/03/2017 04/21/2017 01/29/2017 09/11/2016  Falls in the past year? _0   Comment - - - - -   Depression Screen PHQ 2/9 Scores 04/22/2018 09/03/2017 04/21/2017 01/29/2017  PHQ - 2 Score 0 0 0 0     Cognitive Function MMSE - Mini Mental State Exam 04/22/2018 03/19/2017  Not completed: Refused -  Orientation to time - 4  Orientation to Place - 5  Registration - 3  Attention/ Calculation - 1  Recall - 1  Language- name 2 objects - 2  Language- repeat - 1  Language- follow 3 step command - 3  Language- read & follow direction - 1  Write a sentence - 1  Copy design - 1  Total score - 23     6CIT Screen 04/22/2018 04/21/2017  What Year? 0 points 0 points  What month? 0 points 0 points  What time? 0 points 0 points  Count back from 20 0 points 0 points  Months in reverse (No Data) 0 points    Immunization History  Administered Date(s) Administered  . Influenza Split 04/09/2016  . Influenza Whole 04/13/2011  . Influenza, High Dose Seasonal PF 04/22/2018  . Influenza-Unspecified 05/07/2012, 04/19/2013, 04/28/2014, 03/27/2015, 04/12/2017  . Pneumococcal Conjugate-13 10/09/2014  . Pneumococcal Polysaccharide-23 05/12/2010  . Tdap 03/13/2011  . Zoster 05/12/2008   Screening Tests Health Maintenance  Topic Date Due  . TETANUS/TDAP  03/12/2021  . INFLUENZA VACCINE  Completed  . DEXA SCAN  Completed  . PNA vac Low Risk Adult  Completed      Plan:    End of life planning; Advance aging; Advanced directives discussed. Copy of current HCPOA/Living Will requested.   Keep all routine maintenance appointments.    I have personally reviewed and noted the following in the patient's chart:   . Medical and social history . Use of alcohol, tobacco or illicit drugs  .  Current medications and supplements . Functional ability and status . Nutritional status . Physical activity . Advanced directives . List of other  physicians . Hospitalizations, surgeries, and ER visits in previous 12 months . Vitals . Screenings to include cognitive, depression, and falls . Referrals and appointments  In addition, I have reviewed and discussed with patient certain preventive protocols, quality metrics, and best practice recommendations. A written personalized care plan for preventive services as well as general preventive health recommendations were provided to patient.     Varney Biles, LPN  09/25/402

## 2018-04-22 NOTE — Progress Notes (Signed)
I have reviewed the above note and agree.  Maryanna Stuber, M.D.  

## 2018-04-25 ENCOUNTER — Telehealth: Payer: Self-pay | Admitting: Family Medicine

## 2018-04-25 NOTE — Telephone Encounter (Signed)
Insurance verification for Prolia filed on [employer] Portal. 

## 2018-04-26 ENCOUNTER — Telehealth: Payer: Self-pay

## 2018-04-26 DIAGNOSIS — F329 Major depressive disorder, single episode, unspecified: Secondary | ICD-10-CM | POA: Diagnosis not present

## 2018-04-26 DIAGNOSIS — M5010 Cervical disc disorder with radiculopathy, unspecified cervical region: Secondary | ICD-10-CM | POA: Diagnosis not present

## 2018-04-26 DIAGNOSIS — I10 Essential (primary) hypertension: Secondary | ICD-10-CM | POA: Diagnosis not present

## 2018-04-26 DIAGNOSIS — R251 Tremor, unspecified: Secondary | ICD-10-CM | POA: Diagnosis not present

## 2018-04-26 DIAGNOSIS — M8580 Other specified disorders of bone density and structure, unspecified site: Secondary | ICD-10-CM | POA: Diagnosis not present

## 2018-04-26 DIAGNOSIS — M1712 Unilateral primary osteoarthritis, left knee: Secondary | ICD-10-CM | POA: Diagnosis not present

## 2018-04-26 NOTE — Telephone Encounter (Signed)
Pt was calling wanting to check on her prolia and when it would be ready for pick up pt stated that she is close to running low.    Sent to our clinical pharmacist.

## 2018-04-26 NOTE — Telephone Encounter (Signed)
I do not have any information indicating that a previous clinical pharmacist has assisted this patient with obtaining Prolia. I am not sure where she might be in this process.   [name], PharmD PGY2 Ambulatory Care Pharmacy Resident, [facility] Phone: [phone]

## 2018-04-27 NOTE — Telephone Encounter (Addendum)
[name] this is something I do the Prolia approval they are given in office I have received MS. [name]"s approval and I have scheduled her Prolia injection.

## 2018-04-28 DIAGNOSIS — M5010 Cervical disc disorder with radiculopathy, unspecified cervical region: Secondary | ICD-10-CM | POA: Diagnosis not present

## 2018-04-28 DIAGNOSIS — M1712 Unilateral primary osteoarthritis, left knee: Secondary | ICD-10-CM | POA: Diagnosis not present

## 2018-04-28 DIAGNOSIS — F329 Major depressive disorder, single episode, unspecified: Secondary | ICD-10-CM | POA: Diagnosis not present

## 2018-04-28 DIAGNOSIS — R251 Tremor, unspecified: Secondary | ICD-10-CM | POA: Diagnosis not present

## 2018-04-28 DIAGNOSIS — I10 Essential (primary) hypertension: Secondary | ICD-10-CM | POA: Diagnosis not present

## 2018-04-28 DIAGNOSIS — M8580 Other specified disorders of bone density and structure, unspecified site: Secondary | ICD-10-CM | POA: Diagnosis not present

## 2018-05-10 ENCOUNTER — Telehealth: Payer: Self-pay

## 2018-05-10 ENCOUNTER — Ambulatory Visit (INDEPENDENT_AMBULATORY_CARE_PROVIDER_SITE_OTHER): Payer: Medicare Other

## 2018-05-10 DIAGNOSIS — M8580 Other specified disorders of bone density and structure, unspecified site: Secondary | ICD-10-CM | POA: Diagnosis not present

## 2018-05-10 MED ORDER — DENOSUMAB 60 MG/ML ~~LOC~~ SOSY
60.0000 mg | PREFILLED_SYRINGE | Freq: Once | SUBCUTANEOUS | Status: AC
  Administered 2018-05-10: 60 mg via SUBCUTANEOUS

## 2018-05-10 MED ORDER — WARFARIN SODIUM 1 MG PO TABS: ORAL_TABLET | ORAL | 3 refills | Status: DC

## 2018-05-10 MED ORDER — WARFARIN SODIUM 3 MG PO TABS: ORAL_TABLET | ORAL | 1 refills | Status: DC

## 2018-05-10 NOTE — Telephone Encounter (Signed)
Sent to pharmacy.  She had previously tapered off of Xanax and it has not been refilled in quite some time.  Please confirm whether or not she has been taking this.  Thanks.

## 2018-05-10 NOTE — Telephone Encounter (Signed)
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   Pt stated that she needs the following refilled   Xanax Warfarin 3 MG  Warfarin 1 MG   Last OV 02/07/2018

## 2018-05-10 NOTE — Progress Notes (Signed)
Pt was here today for a NV to receive Prolia given SQ in right arm. Pt tolerated well.

## 2018-05-10 NOTE — Telephone Encounter (Signed)
Sent to [facility]   Sent to PCP

## 2018-05-12 NOTE — Telephone Encounter (Signed)
Called and spoke with pt. Pt stated that she does take this medication at night for her anxiety.  Sent to PCP as an [place]

## 2018-05-14 NOTE — Telephone Encounter (Signed)
Please contact the patient and see if she has any of this medication currently.  Please see if she is taking this every night.  If she is please have her look at the prescription bottle and see who the prescriber is.  Please also see when the prescription was filled. She was only given 10 tablets at that time.  It has not been refilled at the pharmacy since March 2019.  Thanks.

## 2018-05-16 ENCOUNTER — Other Ambulatory Visit: Payer: Self-pay

## 2018-05-16 DIAGNOSIS — Z1231 Encounter for screening mammogram for malignant neoplasm of breast: Secondary | ICD-10-CM

## 2018-05-17 ENCOUNTER — Other Ambulatory Visit: Payer: Self-pay

## 2018-05-17 MED ORDER — ALPRAZOLAM 0.5 MG PO TABS: 0.2500 mg | ORAL_TABLET | Freq: Every evening | ORAL | 0 refills | Status: DC | PRN

## 2018-05-17 NOTE — Telephone Encounter (Signed)
Called and spoke with pt. She stated that her current bottle of XANAX has Dr. Ellen Henri name on the bottle and she does take this medication nightly.   Pt stated that she only takes 1/2 tablet every night.   Pt stated that she would like this Rx to be sent to [facility] ASAP she only has only a "FEW" left did not specifically state how many she has left.   Pt stated that with mail order it takes time for the RX to be sent to her home if she does NOT get the Rx in time she will need a short term supply sent to [facility] and she will have to pay out of pocket for this.   Pt stated that prior to Dr. [name] filling this Rx it was Dr. Gilford Rile before him.   Sent to PCP

## 2018-05-17 NOTE — Telephone Encounter (Signed)
Pt wanted the lioresal removed from her medication list. Pt stated that she is NO longer taking this medication.    Rx has been removed from patient's medication list.

## 2018-05-17 NOTE — Telephone Encounter (Signed)
Called and spoke with pt. Pt advised and voiced understanding.  

## 2018-05-17 NOTE — Telephone Encounter (Signed)
Noted.  Refill sent to mail order pharmacy.  If she needs this to be sent to her local pharmacy before it gets to her she should contact us.  Controlled substance database reviewed.

## 2018-05-20 ENCOUNTER — Ambulatory Visit: Payer: Medicare Other | Admitting: Family Medicine

## 2018-05-31 DIAGNOSIS — D802 Selective deficiency of immunoglobulin A [IgA]: Secondary | ICD-10-CM | POA: Diagnosis not present

## 2018-06-05 ENCOUNTER — Telehealth: Payer: Self-pay | Admitting: Family Medicine

## 2018-06-05 NOTE — Telephone Encounter (Signed)
Please let the patient know her INR is acceptable.  She should continue with her current dosage of Coumadin.  Please abstract the result.  Thanks.

## 2018-06-06 DIAGNOSIS — H3562 Retinal hemorrhage, left eye: Secondary | ICD-10-CM | POA: Diagnosis not present

## 2018-06-06 NOTE — Telephone Encounter (Signed)
Just a FYI

## 2018-06-06 NOTE — Telephone Encounter (Signed)
Patient called back and is aware of the message below. Thanks.

## 2018-06-30 DIAGNOSIS — Z7901 Long term (current) use of anticoagulants: Secondary | ICD-10-CM | POA: Diagnosis not present

## 2018-07-07 ENCOUNTER — Telehealth: Payer: Self-pay | Admitting: Family Medicine

## 2018-07-07 NOTE — Telephone Encounter (Signed)
Called patient and left a detailed VM will call patient back to make sure they are aware.

## 2018-07-07 NOTE — Telephone Encounter (Signed)
Please let the patient know that her INR is in the acceptable range.  She should continue her current Coumadin regimen and have her INR rechecked in 1 month.  Thanks.

## 2018-07-08 ENCOUNTER — Ambulatory Visit: Payer: Self-pay

## 2018-07-08 NOTE — Telephone Encounter (Signed)
Called and spoke with patient. Pt advised and voiced understanding.  

## 2018-07-08 NOTE — Telephone Encounter (Signed)
Pt was read this message from Dr [name]. Pt verbalized understanding of these instructions. [name] Haven, MD       07/07/18 1:50 PM  Note    Please let the patient know that her INR is in the acceptable range.  She should continue her current Coumadin regimen and have her INR rechecked in 1 month.  Thanks.        Reason for Disposition . Caller has medication question only, adult not sick, and triager answers question  Answer Assessment - Initial Assessment Questions 1. SYMPTOMS: "Do you have any symptoms?"     no 2. SEVERITY: If symptoms are present, ask "Are they mild, moderate or severe?"     Pt wants to know if she is to continue with her same regimin of coumadin.  Protocols used: MEDICATION QUESTION CALL-A-AH

## 2018-07-08 NOTE — Telephone Encounter (Signed)
Just a FYI

## 2018-07-14 ENCOUNTER — Ambulatory Visit: Payer: Medicare Other | Admitting: General Surgery

## 2018-07-18 ENCOUNTER — Telehealth: Payer: Self-pay | Admitting: Family Medicine

## 2018-07-18 NOTE — Telephone Encounter (Signed)
Copied from [place] [id number]. Topic: Quick Communication - Rx Refill/Question >> Jul 18, 2018 12:34 PM Rayann Heman wrote: Medication: bisoprolol-hydrochlorothiazide (ZIAC) 5-6.25 MG tablet [[id number]]  pt called and stated that pharmacy is out of medication and would like to know if something else could be called in. Please advise

## 2018-07-18 NOTE — Telephone Encounter (Signed)
Patient has 12 tablets left unti;l PCP is back in office, pharmacy has no bisoprolol-HCTZ.

## 2018-07-19 ENCOUNTER — Other Ambulatory Visit: Payer: Self-pay | Admitting: Internal Medicine

## 2018-07-19 DIAGNOSIS — I10 Essential (primary) hypertension: Secondary | ICD-10-CM

## 2018-07-19 MED ORDER — HYDROCHLOROTHIAZIDE 12.5 MG PO TABS: 6.2500 mg | ORAL_TABLET | Freq: Every day | ORAL | 0 refills | Status: DC

## 2018-07-19 MED ORDER — BISOPROLOL FUMARATE 5 MG PO TABS: 5.0000 mg | ORAL_TABLET | Freq: Every day | ORAL | 0 refills | Status: DC

## 2018-07-21 NOTE — Telephone Encounter (Signed)
It appears Dr. Terese Door wrote a prescription for bisoprolol on its own and hydrochlorothiazide on its own.  Please contact the patient and confirm she was able to pick this up.  Once she picks this up she will start on these 2 pills and discontinue her combined bisoprolol HCTZ pill.  Thanks.

## 2018-08-01 NOTE — Telephone Encounter (Signed)
Patient is calling in regards to hydrochlorothiazide (HYDRODIURIL) 12.5 MG tablet and bisoprolol (ZEBETA) 5 MG tablet  Patient states she never heard from anyone at the office on how these were supposed to be taken and that she would be taking these separate until she opened her mail. Patient also states that bisoprolol (ZEBETA) 5 MG tablet bottle says 90 tablets with no refills but she counted them and it was only 45. She also states that bisoprolol (ZEBETA) 5 MG tablet bottle states that it is 45 tablets with no refills and she counted and its only 30 tablets.   Patient is also wanting to know if she will take these 2 pills separate for now on. Requesting a call back from the office.

## 2018-08-02 ENCOUNTER — Other Ambulatory Visit: Payer: Self-pay | Admitting: Family Medicine

## 2018-08-02 DIAGNOSIS — Z7901 Long term (current) use of anticoagulants: Secondary | ICD-10-CM | POA: Diagnosis not present

## 2018-08-02 MED ORDER — ALPRAZOLAM 0.5 MG PO TABS: 0.2500 mg | ORAL_TABLET | Freq: Every evening | ORAL | 0 refills | Status: DC | PRN

## 2018-08-02 NOTE — Addendum Note (Signed)
Addended by: [name] on: 08/02/2018 04:39 PM   Modules accepted: Orders

## 2018-08-02 NOTE — Telephone Encounter (Signed)
Controlled substance database reviewed. Sent to pharmacy.  Please call the patient and get her set up for a follow-up appointment. She has not been seen in about 6 months. She also needs to have her INR checked at her living facility if she has not had that completed this week.

## 2018-08-02 NOTE — Telephone Encounter (Signed)
Called and spoke with patient. Pt  advised. Pt has started the separated hctz and bisoprolol. Seems to be happy since she hasn't had to wake up in the middle of the night to go void.   Sent to PCP as an [place]

## 2018-08-02 NOTE — Telephone Encounter (Signed)
Copied from [place]. Topic: Quick Communication - Rx Refill/Question >> Aug 02, 2018 11:37 AM Alfredia Ferguson R wrote: Medication: ALPRAZolam Duanne Moron) 0.5 MG tablet  Has the patient contacted their pharmacy? Yes  Preferred Pharmacy (with phone number or street name):[facility] - [place], [place] - [address] [phone] (Phone) [phone] (Fax)    Agent: Please be advised that RX refills may take up to 3 business days. We ask that you follow-up with your pharmacy.

## 2018-08-02 NOTE — Telephone Encounter (Signed)
Noted. Please confirm that she has stopped the combined bisoprolol-HCTZ pill since starting on the individual bisoprolol pill and the individual HCTZ pill. Thanks.

## 2018-08-08 ENCOUNTER — Ambulatory Visit
Admission: RE | Admit: 2018-08-08 | Discharge: 2018-08-08 | Disposition: A | Discharge status: Home / Self Care | Payer: Medicare Other | Source: Ambulatory Visit | Attending: General Surgery | Admitting: General Surgery

## 2018-08-08 DIAGNOSIS — Z1231 Encounter for screening mammogram for malignant neoplasm of breast: Secondary | ICD-10-CM | POA: Diagnosis not present

## 2018-08-09 ENCOUNTER — Telehealth: Payer: Self-pay | Admitting: Family Medicine

## 2018-08-09 NOTE — Telephone Encounter (Signed)
Do you have a result from 08/02/18?

## 2018-08-09 NOTE — Telephone Encounter (Signed)
Copied from [place] [id number]. Topic: General - Other >> Aug 09, 2018  9:59 AM Lennox Solders wrote: Reason for CRM: pt had her PT drawn at [facility] on 08-02-2018. Pt would like to know if she needs to stays on same amount of coumadin and her others medication. Pt is have telephone issues if unable to reach pt she will call back

## 2018-08-09 NOTE — Telephone Encounter (Signed)
I just received this. Her INR is elevated at 3.8. She needs to hold her dose today. Please confirm her current coumadin regimen and then I will be able to advise on what dosing she needs to take moving forward. Please also see if she has been taking any antibiotics recently. She will need this rechecked in one week. If she is having any bleeding issues she needs to be evaluated.

## 2018-08-09 NOTE — Telephone Encounter (Signed)
Pt calling to check status. Please advise  °

## 2018-08-09 NOTE — Telephone Encounter (Signed)
Not able to reach patient by phone left message to please call office. Please transfer to office.

## 2018-08-10 NOTE — Telephone Encounter (Signed)
Left message on sons voicemail able to reach patient per DPR.

## 2018-08-10 NOTE — Telephone Encounter (Signed)
Left message to return call to office.

## 2018-08-10 NOTE — Telephone Encounter (Signed)
I have called both patient as well as son & asked that they call back office to not only schedule appointment, but to make sure that INR is being checked.

## 2018-08-10 NOTE — Telephone Encounter (Signed)
Patient's son, Marcello Moores said he needs to speak back with you [name]. He said he got disconnected

## 2018-08-10 NOTE — Telephone Encounter (Signed)
Call back to this number with new regimen ask for Aaron Edelman [phone]. See old regimen in previous note advised patient to hold today.

## 2018-08-10 NOTE — Telephone Encounter (Signed)
Current regimen alternating between 3-4 mg, one day 3 next 4 mg.

## 2018-08-10 NOTE — Telephone Encounter (Signed)
Patient son call ed to let me know the phone is not working but he cannot get a message to patient either. [place] spoke with receptionist and they are going to alert patient and have her call office.

## 2018-08-10 NOTE — Telephone Encounter (Signed)
Patient will take 4 mg of warfarin on Mondays and Thursdays.  She will take 3 mg of warfarin on Sunday, Tuesday, Wednesday, Friday, and Saturday.  She should have her INR rechecked Wednesday of next week.

## 2018-08-10 NOTE — Telephone Encounter (Signed)
Patients son is requesting to speak with you

## 2018-08-11 ENCOUNTER — Ambulatory Visit: Payer: Medicare Other | Admitting: General Surgery

## 2018-08-11 NOTE — Telephone Encounter (Signed)
Spoke with Regulatory affairs officer in the health clinic at the [facility], and gave direction as to new coumadin dosing and understanding was voiced with repeat of orders , patient will recheck coumadin level on Wednesday 08/17/18.

## 2018-08-17 ENCOUNTER — Encounter: Payer: Self-pay | Admitting: Family Medicine

## 2018-08-17 DIAGNOSIS — Z7901 Long term (current) use of anticoagulants: Secondary | ICD-10-CM | POA: Diagnosis not present

## 2018-08-18 ENCOUNTER — Other Ambulatory Visit: Payer: Self-pay

## 2018-08-18 ENCOUNTER — Ambulatory Visit: Payer: Medicare Other | Admitting: General Surgery

## 2018-08-18 ENCOUNTER — Ambulatory Visit (INDEPENDENT_AMBULATORY_CARE_PROVIDER_SITE_OTHER): Payer: Medicare Other | Admitting: Surgery

## 2018-08-18 ENCOUNTER — Encounter: Payer: Self-pay | Admitting: Surgery

## 2018-08-18 VITALS — BP 150/78 | HR 79 | Temp 97.9°F | Ht 60.0 in | Wt 129.0 lb

## 2018-08-18 DIAGNOSIS — Z853 Personal history of malignant neoplasm of breast: Secondary | ICD-10-CM | POA: Diagnosis not present

## 2018-08-18 LAB — PROTIME-INR: INR: 2.8 — AB (ref 0.9–1.1)

## 2018-08-18 NOTE — Patient Instructions (Addendum)
Patient will be asked to return to the office in one year with a  right breast screening  mammogram with Dr. Bary Castilla. The patient is aware to call back for any questions or concerns.

## 2018-08-18 NOTE — Progress Notes (Signed)
Surgical Clinic Progress/Follow-up Note   HPI:  85 y.o. Female presents to clinic to discuss results of recent annual screening mammogram and for follow-up evaluation and corresponding exam. Patient denies any breast pain, nipple discharge, breast mass(es), fever/chills, unintentional weight loss, CP, or SOB.  Review of Systems:  Constitutional: denies any other weight loss, fever, chills, or sweats  Eyes: denies any other vision changes, history of eye injury  ENT: denies sore throat, hearing problems  Respiratory: denies shortness of breath, wheezing  Cardiovascular: denies chest pain, palpitations  Gastrointestinal: denies abdominal pain, N/V, or diarrhea Musculoskeletal: denies any other joint pains or cramps  Skin: Denies any other rashes or skin discolorations  Neurological: denies any other headache, dizziness, weakness  Psychiatric: denies any other depression, anxiety  All other review of systems: otherwise negative   Vital Signs:  BP (!) 150/78   Pulse 79   Temp 97.9 F (36.6 C) (Skin)   Ht 5' (1.524 m)   Wt 129 lb (58.5 kg)   SpO2 91%   BMI 25.19 kg/m    Physical Exam:  Constitutional:  -- Normal body habitus  -- Awake, alert, and oriented x3  Eyes:  -- Pupils equally round and reactive to light  -- No scleral icterus  Ear, nose, throat:  -- No jugular venous distension  -- No nasal drainage, bleeding Pulmonary:  -- No crackles -- Equal breath sounds bilaterally -- Breathing non-labored at rest Cardiovascular:  -- S1, S2 present  -- No pericardial rubs  Breast: -- Left mastectomy site well-healed and non-tender to palpation without erythema, drainage, or axillary lymphadenopathy -- Right breast without appreciable mass(es), nipple discharge, nipple inversion, tenderness to palpation, or axillary lymphadenopathy Gastrointestinal:  -- Soft, nontender, non-distended, no guarding/rebound  -- No abdominal masses appreciated, pulsatile or otherwise   Musculoskeletal / Integumentary:  -- Wounds or skin discoloration: None appreciated  -- Extremities: B/L UE and LE FROM, hands and feet warm, no edema (specifically no appreciable LUE lymphedema) Neurologic:  -- Motor function: intact and symmetric  -- Sensation: intact and symmetric   Imaging:  Right Breast Screening Mammogram (08/08/2018) ACR Breast Density Category b: There are scattered areas of fibroglandular density.  There are no Right breast findings suspicious for malignancy.   Assessment:  85 y.o. yo Female with a problem list including...  Patient Active Problem List   Diagnosis Date Noted  . Subconjunctival hemorrhage of left eye 02/13/2018  . Positional right arm tingling while sleeping 10/01/2017  . Anxiety 12/14/2016  . Nocturia 12/14/2016  . Varicose veins of both lower extremities 09/11/2016  . Rash and nonspecific skin eruption 07/13/2016  . Dry eyes 06/11/2016  . Fall 03/11/2016  . Tremor 01/16/2016  . Seasonal allergic rhinitis 02/28/2015  . Diarrhea 01/15/2015  . Cold intolerance 11/26/2014  . Osteoarthritis of left knee 08/31/2014  . Gait instability 08/31/2014  . Memory loss 08/31/2014  . Seborrheic keratoses 12/28/2012  . Osteopenia 09/22/2012  . Breast cancer ([place]) 06/02/2012  . Depression 01/08/2012  . History of pulmonary embolus (PE) 12/03/2011  . Insomnia 05/13/2011  . Hypertension 05/13/2011    presents to clinic, doing overall well, specifically with no clinical nor radiographic evidence of breast malignancy s/p remote Left mastectomy for invasive Left breast cancer.  Plan:   - results of most recent mammogram were discussed  - discussed with patient ongoing annual mammography vs continuation, and patient adamantly states she gains piece of mind from ongoing breast cancer screening and wishes to continue screening as  long as she can get to mammography  - will accordingly order follow-up annual screening mammography in 1 year  - return to  clinic in 1 year following scheduled screening mammogram  - instructed to call office if any questions or concerns  All of the above recommendations were discussed with the patient, and all of patient's questions were answered to her expressed satisfaction.  -- Marilynne Drivers Rosana Hoes, MD, [place]: [facility] - Partnering for exceptional care. Office: [phone]

## 2018-08-19 ENCOUNTER — Telehealth: Payer: Self-pay | Admitting: Family Medicine

## 2018-08-19 NOTE — Telephone Encounter (Addendum)
Please let the patient know that her INR is 2.8 and is now at goal.  She should continue with her current regimen.  Recheck in 2 weeks.  You can contact the Madie Reno, RN at [facility] at [phone].

## 2018-08-22 ENCOUNTER — Telehealth: Payer: Self-pay

## 2018-08-22 NOTE — Telephone Encounter (Signed)
Copied from [place] [id number]. Topic: General - Inquiry >> Aug 22, 2018 11:23 AM Conception Chancy, NT wrote: Reason for CRM: Aaron Edelman is calling from [facility] and states he has sent over the patients INR results and would like to know if any medicine needs to be adjusted. Please contact.  Aaron Edelman: [phone]

## 2018-08-22 NOTE — Telephone Encounter (Signed)
Left detailed message for patient to return call to office. 

## 2018-08-23 NOTE — Telephone Encounter (Signed)
Brain at [facility] notified and voiced understanding

## 2018-08-23 NOTE — Telephone Encounter (Signed)
Aaron Edelman at the [facility] at [place] called back stated that [place] left him a message to call back. Aaron Edelman: [phone]

## 2018-08-23 NOTE — Telephone Encounter (Signed)
See previous note

## 2018-08-24 NOTE — Telephone Encounter (Signed)
Brain called back and stated that he had spoke with [name] and she advised him of what was needed for [name] to stay on current dose and recheck in 2 weeks [name]'s INR on 08/18/2018 was 2.8 and [name] was 26.4.   Nothing further is needed.

## 2018-08-24 NOTE — Telephone Encounter (Signed)
Called Aaron Edelman and left a VM to call back. CRM created and sent to [facility] pool. Will need INR results have not received these yet.

## 2018-09-01 ENCOUNTER — Encounter: Payer: Self-pay | Admitting: Family Medicine

## 2018-09-01 ENCOUNTER — Ambulatory Visit (INDEPENDENT_AMBULATORY_CARE_PROVIDER_SITE_OTHER): Payer: Medicare Other | Admitting: Family Medicine

## 2018-09-01 VITALS — BP 108/62 | HR 79 | Temp 98.1°F | Resp 18 | Ht 61.0 in | Wt 125.4 lb

## 2018-09-01 DIAGNOSIS — R059 Cough, unspecified: Secondary | ICD-10-CM

## 2018-09-01 DIAGNOSIS — R0989 Other specified symptoms and signs involving the circulatory and respiratory systems: Secondary | ICD-10-CM

## 2018-09-01 DIAGNOSIS — R05 Cough: Secondary | ICD-10-CM

## 2018-09-01 DIAGNOSIS — J019 Acute sinusitis, unspecified: Secondary | ICD-10-CM

## 2018-09-01 MED ORDER — BENZONATATE 100 MG PO CAPS: 200.0000 mg | ORAL_CAPSULE | Freq: Three times a day (TID) | ORAL | 0 refills | Status: DC | PRN

## 2018-09-01 MED ORDER — CEFTRIAXONE SODIUM 1 G IJ SOLR
1.0000 g | Freq: Once | INTRAMUSCULAR | Status: AC
  Administered 2018-09-01: 1 g via INTRAMUSCULAR

## 2018-09-01 MED ORDER — ALBUTEROL SULFATE HFA 108 (90 BASE) MCG/ACT IN AERS: 2.0000 | INHALATION_SPRAY | Freq: Four times a day (QID) | RESPIRATORY_TRACT | 1 refills | Status: DC | PRN

## 2018-09-01 NOTE — Progress Notes (Signed)
Subjective:    Patient ID: [name], female    DOB: [birth year], 85 y.o.   MRN: [id number]  HPI   Patient presents to clinic complaining of sinus pain, thick nasal drainage, chest congestion and cough for over 1 week.  She has used over-the-counter Tylenol Cold and flu and Tussend syrup with some help in reducing symptoms, but sinus pain and thick drainage continued to persist.  Denies fever or chills.  Denies feeling shortness of breath.  Denies nausea/vomiting or diarrhea.  Denies chest pain.  Patient Active Problem List   Diagnosis Date Noted  . Subconjunctival hemorrhage of left eye 02/13/2018  . Positional right arm tingling while sleeping 10/01/2017  . Anxiety 12/14/2016  . Nocturia 12/14/2016  . Varicose veins of both lower extremities 09/11/2016  . Rash and nonspecific skin eruption 07/13/2016  . Dry eyes 06/11/2016  . Fall 03/11/2016  . Tremor 01/16/2016  . Seasonal allergic rhinitis 02/28/2015  . Diarrhea 01/15/2015  . Cold intolerance 11/26/2014  . Osteoarthritis of left knee 08/31/2014  . Gait instability 08/31/2014  . Memory loss 08/31/2014  . Seborrheic keratoses 12/28/2012  . Osteopenia 09/22/2012  . Breast cancer ([place]) 06/02/2012  . Depression 01/08/2012  . History of pulmonary embolus (PE) 12/03/2011  . Insomnia 05/13/2011  . Hypertension 05/13/2011   Social History   Tobacco Use  . Smoking status: Former Smoker    Last attempt to quit: 05/13/1999    Years since quitting: 19.3  . Smokeless tobacco: Never Used  Substance Use Topics  . Alcohol use: No    Review of Systems  Constitutional: Negative for chills, fatigue and fever.  HENT: +congestion, ear pain, sinus pain, sinus drainage and sore throat.   Eyes: Negative.   Respiratory: Negative for shortness of breath and wheezing.  +cough, chest congestion Cardiovascular: Negative for chest pain, palpitations and leg swelling.  Gastrointestinal: Negative for abdominal pain, diarrhea, nausea and  vomiting.  Genitourinary: Negative for dysuria, frequency and urgency.  Musculoskeletal: Negative for arthralgias and myalgias.  Skin: Negative for color change, pallor and rash.  Neurological: Negative for syncope, light-headedness and headaches.  Psychiatric/Behavioral: The patient is not nervous/anxious.       Objective:   Physical Exam Vitals signs and nursing note reviewed.  Constitutional:      General: She is not in acute distress.    Appearance: She is not toxic-appearing.  HENT:     Head: Normocephalic and atraumatic.     Ears:     Comments: Fullness bilateral TMs    Nose: Nasal tenderness, mucosal edema, congestion and rhinorrhea present.     Right Sinus: Maxillary sinus tenderness and frontal sinus tenderness present.     Left Sinus: Maxillary sinus tenderness and frontal sinus tenderness present.     Mouth/Throat:     Mouth: Mucous membranes are moist.     Comments: +post nasal drip Eyes:     General: No scleral icterus.       Right eye: No discharge.        Left eye: No discharge.     Extraocular Movements: Extraocular movements intact.     Conjunctiva/sclera: Conjunctivae normal.  Neck:     Musculoskeletal: Neck supple. No neck rigidity.  Cardiovascular:     Rate and Rhythm: Normal rate and regular rhythm.     Heart sounds: Normal heart sounds.  Pulmonary:     Effort: Pulmonary effort is normal. No respiratory distress.     Breath sounds: Normal breath sounds. No wheezing,  rhonchi or rales.     Comments: +wet cough Musculoskeletal:     Right lower leg: No edema.     Left lower leg: No edema.  Lymphadenopathy:     Cervical: No cervical adenopathy.  Neurological:     Mental Status: She is alert and oriented to person, place, and time.  Psychiatric:        Mood and Affect: Mood normal.        Behavior: Behavior normal.    Vitals:   09/01/18 1100  BP: 108/62  Pulse: 79  Resp: 18  Temp: 98.1 F (36.7 C)  SpO2: 94%      Assessment & Plan:   Acute  sinusitis, chest congestion, cough- patients symptoms appear concerning for sinusitis.  Lungs are clear on exam, suspect cough and feeling of congestion in chest is related to postnasal drainage/upper respiratory irritation.  Patient will get 1 g IM Rocephin in clinic today.  She would prefer injection rather than oral antibiotics due to oral antibiotics giving her diarrhea.  Patient advised she can continue to use Cheratussin cough syrup, and she will trial Tessalon Perles as needed to calm cough if Tessalon syrup is not helpful.  I did offer patient a short course of steroids to help reduce sinus inflammation, but she declines.  Advised to increase her water and fluid intake, get plenty of rest and do good handwashing.  Prescription albuterol inhaler sent to pharmacy to use as needed to help reduce chest congestion symptoms.  Also advised that if cough persists into next week, she should return to clinic for reevaluation and chest x-ray at that time.  Administrations This Visit    cefTRIAXone (ROCEPHIN) injection 1 g    Admin Date 09/01/2018 Action Given Dose 1 g Route Intramuscular Administered By Neta Ehlers, RMA          I offered 1 to 2-week follow-up appointment, but she states she will call and let us know if she is not improving.  Patient advised that if her cough worsens, she develops shortness of breath that is not improved by use of inhaler she should call office right away and/or go to emergency room for evaluation.  She will otherwise follow-up as regularly scheduled with PCP.

## 2018-09-01 NOTE — Patient Instructions (Addendum)
Continue to use your Tussin cough syrup as you have been. I sent in an alternative cough medication, tessalon perles, to try as needed for your cough  If cough persists into next week we should do CXR  Increase water/fluid intake -- get a large cup with a straw and sip on it throughout the day to keep self well hydrated

## 2018-09-09 DIAGNOSIS — Z7901 Long term (current) use of anticoagulants: Secondary | ICD-10-CM | POA: Diagnosis not present

## 2018-09-09 LAB — POCT INR: INR: 3.9 — AB (ref <=1.1)

## 2018-09-10 LAB — PROTIME-INR: Protime: 36.1 — AB (ref 10.0–13.8)

## 2018-09-12 ENCOUNTER — Telehealth: Payer: Self-pay

## 2018-09-12 NOTE — Telephone Encounter (Signed)
Noted. Patient should decrease her coumadin to 3 mg daily. She needs to have her INR rechecked in 1 week. If she has bleeding issues she should be evaluated.

## 2018-09-12 NOTE — Telephone Encounter (Signed)
Fax from [facility]  PT: 36.1 INR 3.9   tylenol serve cold and flu tablet takes every so often for cold like symptoms   Pt is currently taking warfarin 3 mg once daily and takes 1mg  plus the 3mg  (4 mg) on Monday and Thursday.   Pt has an appt with Olivia Mackie tomorrow   Pt was give 1 gram rocephin

## 2018-09-13 ENCOUNTER — Encounter: Payer: Self-pay | Admitting: Internal Medicine

## 2018-09-13 ENCOUNTER — Ambulatory Visit (INDEPENDENT_AMBULATORY_CARE_PROVIDER_SITE_OTHER): Payer: Medicare Other | Admitting: Internal Medicine

## 2018-09-13 VITALS — BP 118/78 | HR 90 | Temp 98.3°F | Ht 61.0 in | Wt 127.0 lb

## 2018-09-13 DIAGNOSIS — J0100 Acute maxillary sinusitis, unspecified: Secondary | ICD-10-CM | POA: Diagnosis not present

## 2018-09-13 MED ORDER — DOXYCYCLINE HYCLATE 100 MG PO TABS: 100.0000 mg | ORAL_TABLET | Freq: Two times a day (BID) | ORAL | 0 refills | Status: DC

## 2018-09-13 NOTE — Progress Notes (Signed)
Pre visit review using our clinic review tool, if applicable. No additional management support is needed unless otherwise documented below in the visit note. 

## 2018-09-13 NOTE — Patient Instructions (Signed)
Robitussin DM or Mucinex DM green label for cough    Sinusitis, Adult Sinusitis is inflammation of your sinuses. Sinuses are hollow spaces in the bones around your face. Your sinuses are located:  Around your eyes.  In the middle of your forehead.  Behind your nose.  In your cheekbones. Mucus normally drains out of your sinuses. When your nasal tissues become inflamed or swollen, mucus can become trapped or blocked. This allows bacteria, viruses, and fungi to grow, which leads to infection. Most infections of the sinuses are caused by a virus. Sinusitis can develop quickly. It can last for up to 4 weeks (acute) or for more than 12 weeks (chronic). Sinusitis often develops after a cold. What are the causes? This condition is caused by anything that creates swelling in the sinuses or stops mucus from draining. This includes:  Allergies.  Asthma.  Infection from bacteria or viruses.  Deformities or blockages in your nose or sinuses.  Abnormal growths in the nose (nasal polyps).  Pollutants, such as chemicals or irritants in the air.  Infection from fungi (rare). What increases the risk? You are more likely to develop this condition if you:  Have a weak body defense system (immune system).  Do a lot of swimming or diving.  Overuse nasal sprays.  Smoke. What are the signs or symptoms? The main symptoms of this condition are pain and a feeling of pressure around the affected sinuses. Other symptoms include:  Stuffy nose or congestion.  Thick drainage from your nose.  Swelling and warmth over the affected sinuses.  Headache.  Upper toothache.  A cough that may get worse at night.  Extra mucus that collects in the throat or the back of the nose (postnasal drip).  Decreased sense of smell and taste.  Fatigue.  A fever.  Sore throat.  Bad breath. How is this diagnosed? This condition is diagnosed based on:  Your symptoms.  Your medical history.  A  physical exam.  Tests to find out if your condition is acute or chronic. This may include: ? Checking your nose for nasal polyps. ? Viewing your sinuses using a device that has a light (endoscope). ? Testing for allergies or bacteria. ? Imaging tests, such as an MRI or CT scan. In rare cases, a bone biopsy may be done to rule out more serious types of fungal sinus disease. How is this treated? Treatment for sinusitis depends on the cause and whether your condition is chronic or acute.  If caused by a virus, your symptoms should go away on their own within 10 days. You may be given medicines to relieve symptoms. They include: ? Medicines that shrink swollen nasal passages (topical intranasal decongestants). ? Medicines that treat allergies (antihistamines). ? A spray that eases inflammation of the nostrils (topical intranasal corticosteroids). ? Rinses that help get rid of thick mucus in your nose (nasal saline washes).  If caused by bacteria, your health care provider may recommend waiting to see if your symptoms improve. Most bacterial infections will get better without antibiotic medicine. You may be given antibiotics if you have: ? A severe infection. ? A weak immune system.  If caused by narrow nasal passages or nasal polyps, you may need to have surgery. Follow these instructions at home: Medicines  Take, use, or apply over-the-counter and prescription medicines only as told by your health care provider. These may include nasal sprays.  If you were prescribed an antibiotic medicine, take it as told by your health  care provider. Do not stop taking the antibiotic even if you start to feel better. Hydrate and humidify   Drink enough fluid to keep your urine pale yellow. Staying hydrated will help to thin your mucus.  Use a cool mist humidifier to keep the humidity level in your home above 50%.  Inhale steam for 10-15 minutes, 3-4 times a day, or as told by your health care  provider. You can do this in the bathroom while a hot shower is running.  Limit your exposure to cool or dry air. Rest  Rest as much as possible.  Sleep with your head raised (elevated).  Make sure you get enough sleep each night. General instructions   Apply a warm, moist washcloth to your face 3-4 times a day or as told by your health care provider. This will help with discomfort.  Wash your hands often with soap and water to reduce your exposure to germs. If soap and water are not available, use hand sanitizer.  Do not smoke. Avoid being around people who are smoking (secondhand smoke).  Keep all follow-up visits as told by your health care provider. This is important. Contact a health care provider if:  You have a fever.  Your symptoms get worse.  Your symptoms do not improve within 10 days. Get help right away if:  You have a severe headache.  You have persistent vomiting.  You have severe pain or swelling around your face or eyes.  You have vision problems.  You develop confusion.  Your neck is stiff.  You have trouble breathing. Summary  Sinusitis is soreness and inflammation of your sinuses. Sinuses are hollow spaces in the bones around your face.  This condition is caused by nasal tissues that become inflamed or swollen. The swelling traps or blocks the flow of mucus. This allows bacteria, viruses, and fungi to grow, which leads to infection.  If you were prescribed an antibiotic medicine, take it as told by your health care provider. Do not stop taking the antibiotic even if you start to feel better.  Keep all follow-up visits as told by your health care provider. This is important. This information is not intended to replace advice given to you by your health care provider. Make sure you discuss any questions you have with your health care provider. Document Released: 07/13/2005 Document Revised: 12/13/2017 Document Reviewed: 12/13/2017 Elsevier  Interactive Patient Education  2019 [employer].

## 2018-09-13 NOTE — Telephone Encounter (Signed)
Spoke with pt and informed her of her INR results and the dose change to 3mg  daily. Also let the pt know that her INR needs to be rechecked in one week. Pt gave a verbal understanding but stated that we would need to contact the [facility] to let them know that she will need her INR rechecked in one week. Called the [facility] and spoke with Aaron Edelman and gave him the verbal order to recheck pt's INR in one week. Brain gave a verbal understanding.

## 2018-09-13 NOTE — Progress Notes (Signed)
Chief Complaint  Patient presents with  . Sinus Problem   F/u sick visit c/o cough with yellow phelgm no fever sinus pressure (still present) and ha tried warm salt gargles voice was hoarse. She had slight h/a. Tried severe flu/cold syrup otc w/o much relief rocephin 09/01/2018 gave her diarrhea and she missed her eye md appt     Review of Systems  Constitutional: Negative for fever and weight loss.  HENT: Positive for sinus pain. Negative for sore throat.   Eyes: Negative for blurred vision.  Respiratory: Positive for cough and sputum production.   Cardiovascular: Negative for chest pain.  Skin: Negative for rash.   Past Medical History:  Diagnosis Date  . Allergy   . Breast cancer ([place]) 07/22/2012   T1c, Nx carcinoma left breast, ER 90%, PR 90%, HER-2/neu not over expressed.  . Hypertension   . Insomnia   . Osteoporosis    Prolia 10/2012  . Pulmonary embolus ([place]) 2009  . S/P IVC filter 2009   Past Surgical History:  Procedure Laterality Date  . BLADDER SURGERY    . BREAST SURGERY Left 2013   mastectomy  . CHOLECYSTECTOMY    . HERNIA REPAIR    . MASTECTOMY Left 2013   BREAST CA  . REPLACEMENT TOTAL KNEE     right  . TONSILLECTOMY     Family History  Problem Relation Age of Onset  . COPD Mother   . Stroke Father   . Cancer Father        breast  . Breast cancer Maternal Aunt    Social History   Socioeconomic History  . Marital status: Widowed    Spouse name: Not on file  . Number of children: Not on file  . Years of education: Not on file  . Highest education level: Not on file  Occupational History  . Not on file  Social Needs  . Financial resource strain: Not hard at all  . Food insecurity:    Worry: Never true    Inability: Never true  . Transportation needs:    Medical: No    Non-medical: No  Tobacco Use  . Smoking status: Former Smoker    Last attempt to quit: 05/13/1999    Years since quitting: 19.3  . Smokeless tobacco: Never Used  Substance  and Sexual Activity  . Alcohol use: No  . Drug use: No  . Sexual activity: Not on file  Lifestyle  . Physical activity:    Days per week: 0 days    Minutes per session: Not on file  . Stress: Not at all  Relationships  . Social connections:    Talks on phone: Not on file    Gets together: Not on file    Attends religious service: Not on file    Active member of club or organization: Not on file    Attends meetings of clubs or organizations: Not on file    Relationship status: Not on file  . Intimate partner violence:    Fear of current or ex partner: Not on file    Emotionally abused: Not on file    Physically abused: Not on file    Forced sexual activity: Not on file  Other Topics Concern  . Not on file  Social History Narrative   Lives at [facility] at [place]. Has 2 sons. Widow.   Current Meds  Medication Sig  . albuterol (PROVENTIL HFA;VENTOLIN HFA) 108 (90 Base) MCG/ACT inhaler Inhale 2 puffs into  the lungs every 6 (six) hours as needed for wheezing or shortness of breath.  . ALPRAZolam (XANAX) 0.5 MG tablet Take 0.5 tablets (0.25 mg total) by mouth at bedtime as needed for anxiety or sleep.  . benzonatate (TESSALON) 100 MG capsule Take 2 capsules (200 mg total) by mouth 3 (three) times daily as needed for cough.  . Biotin 10 MG TABS Take by mouth.  . bisoprolol (ZEBETA) 5 MG tablet Take 1 tablet (5 mg total) by mouth daily.  . Cholecalciferol (VITAMIN D PO) Take 2,000 mg by mouth daily.  Marland Kitchen denosumab (PROLIA) 60 MG/ML SOSY injection Prolia 60 mg/mL subcutaneous syringe  Inject 1 mL by subcutaneous route.  . diphenoxylate-atropine (LOMOTIL) 2.5-0.025 MG tablet Take 1 tablet by mouth 4 (four) times daily as needed for diarrhea or loose stools.  . fluticasone (FLONASE) 50 MCG/ACT nasal spray Place 2 sprays into both nostrils daily.  . hydrochlorothiazide (HYDRODIURIL) 12.5 MG tablet Take 0.5 tablets (6.25 mg total) by mouth daily.  . mirtazapine (REMERON) 7.5 MG tablet  Take 1 tablet (7.5 mg total) by mouth at bedtime.  Marland Kitchen warfarin (COUMADIN) 1 MG tablet Take 1 mg by mouth on Monday, Wednesday, and Friday  . warfarin (COUMADIN) 3 MG tablet TAKE 1 TABLET DAILY  . XIIDRA 5 % SOLN    Allergies  Allergen Reactions  . Erythromycin   . Sulfa Drugs Cross Reactors     rash   Recent Results (from the past 2160 hour(s))  Protime-INR     Status: Abnormal   Collection Time: 08/17/18 12:00 AM  Result Value Ref Range   INR 2.8 (A) 0.9 - 1.1   Objective  Body mass index is 24 kg/m. Wt Readings from Last 3 Encounters:  09/13/18 127 lb (57.6 kg)  09/01/18 125 lb 6.4 oz (56.9 kg)  08/18/18 129 lb (58.5 kg)   Temp Readings from Last 3 Encounters:  09/13/18 98.3 F (36.8 C) (Oral)  09/01/18 98.1 F (36.7 C) (Oral)  08/18/18 97.9 F (36.6 C) (Skin)   BP Readings from Last 3 Encounters:  09/13/18 118/78  09/01/18 108/62  08/18/18 (!) 150/78   Pulse Readings from Last 3 Encounters:  09/13/18 90  09/01/18 79  08/18/18 79    Physical Exam Vitals signs and nursing note reviewed.  Constitutional:      Appearance: Normal appearance. She is well-developed and well-groomed.  HENT:     Head: Normocephalic and atraumatic.     Nose:     Right Sinus: Maxillary sinus tenderness present.     Left Sinus: Maxillary sinus tenderness present.     Mouth/Throat:     Mouth: Mucous membranes are moist.     Pharynx: Oropharynx is clear.  Eyes:     Conjunctiva/sclera: Conjunctivae normal.     Pupils: Pupils are equal, round, and reactive to light.  Cardiovascular:     Rate and Rhythm: Normal rate and regular rhythm.     Heart sounds: Normal heart sounds.  Pulmonary:     Effort: Pulmonary effort is normal.     Breath sounds: Normal breath sounds.  Skin:    General: Skin is warm and dry.  Neurological:     General: No focal deficit present.     Mental Status: She is alert and oriented to person, place, and time. Mental status is at baseline.     Gait: Gait  normal.  Psychiatric:        Attention and Perception: Attention and perception normal.  Mood and Affect: Mood and affect normal.        Speech: Speech normal.        Behavior: Behavior is cooperative.        Thought Content: Thought content normal.        Cognition and Memory: Cognition and memory normal.        Judgment: Judgment normal.     Assessment   1. Sinusitis x 2 weeks  Plan  1. Doxy bid x 7 days rec mucinex dm green label or robitussin dm   Provider: Dr. Olivia Mackie McLean-Scocuzza-Internal Medicine

## 2018-09-19 ENCOUNTER — Telehealth: Payer: Self-pay | Admitting: Family Medicine

## 2018-09-19 NOTE — Telephone Encounter (Signed)
Copied from [place] [id number]. Topic: Quick Communication - See Telephone Encounter >> Sep 19, 2018  9:11 AM Valla Leaver wrote: CRM for notification. See Telephone encounter for: 09/19/18. Patient says hydrochlorothiazide (HYDRODIURIL) 12.5 MG tablet that Dr. [name] prescribed is not helping her at all with low back and flank pain. Her urine output is very low and she is concerned about getting an infection.

## 2018-09-23 ENCOUNTER — Other Ambulatory Visit: Payer: Self-pay

## 2018-09-23 DIAGNOSIS — I10 Essential (primary) hypertension: Secondary | ICD-10-CM

## 2018-09-23 DIAGNOSIS — Z7901 Long term (current) use of anticoagulants: Secondary | ICD-10-CM | POA: Diagnosis not present

## 2018-09-23 LAB — PROTIME-INR

## 2018-09-23 NOTE — Telephone Encounter (Signed)
The hydrochlorothiazide is not for back pain or flank pain.  If she is having back and flank pain with low urine output she should be evaluated at an urgent care.  Thanks.

## 2018-09-25 MED ORDER — BISOPROLOL FUMARATE 5 MG PO TABS: 5.0000 mg | ORAL_TABLET | Freq: Every day | ORAL | 1 refills | Status: DC

## 2018-09-25 NOTE — Telephone Encounter (Signed)
Sent to [facility].  

## 2018-09-26 MED ORDER — BISOPROLOL FUMARATE 5 MG PO TABS: 5.0000 mg | ORAL_TABLET | Freq: Every day | ORAL | 1 refills | Status: DC

## 2018-09-26 NOTE — Telephone Encounter (Signed)
Called and spoke with the pt, confirmed she is on 3mg  of coumadin daily, pt states a nurse will check it in 1 month and report to provider.  Nima, CMA

## 2018-09-26 NOTE — Telephone Encounter (Signed)
Informed the patient medication was done.  [name]

## 2018-09-26 NOTE — Telephone Encounter (Signed)
Call patient Based on chart review, goal of INR is 2-3.  She may stay on current regimen.  Confirm patient is on 3 mg Coumadin daily. Please advise her to have INR checked in 1 month.

## 2018-10-04 ENCOUNTER — Other Ambulatory Visit: Payer: Self-pay

## 2018-10-04 DIAGNOSIS — I10 Essential (primary) hypertension: Secondary | ICD-10-CM

## 2018-10-04 NOTE — Telephone Encounter (Signed)
This patient needs a BMP.  Please see if her living facility could potentially draw this for [place].  This will need to be arranged prior to me refilling this medication.  Thanks.

## 2018-10-04 NOTE — Telephone Encounter (Signed)
Last OV 09/01/2018  Last refilled   45 tablet 0 07/19/2018    [facility]   Sent to PCP to advise

## 2018-10-06 ENCOUNTER — Other Ambulatory Visit: Payer: Self-pay

## 2018-10-06 DIAGNOSIS — I10 Essential (primary) hypertension: Secondary | ICD-10-CM

## 2018-10-06 MED ORDER — HYDROCHLOROTHIAZIDE 12.5 MG PO TABS: 6.2500 mg | ORAL_TABLET | Freq: Every day | ORAL | 3 refills | Status: DC

## 2018-10-07 ENCOUNTER — Other Ambulatory Visit: Payer: Self-pay | Admitting: Family Medicine

## 2018-10-07 ENCOUNTER — Telehealth: Payer: Self-pay | Admitting: Family Medicine

## 2018-10-07 NOTE — Telephone Encounter (Signed)
Requested medication (s) are due for refill today -yes  Requested medication (s) are on the active medication list -yes  Future visit scheduled -yes  Last refill: 08/02/18  Notes to clinic: Patient is requesting non delegated medication- sent for PCP review  Requested Prescriptions  Pending Prescriptions Disp Refills   ALPRAZolam (XANAX) 0.5 MG tablet 30 tablet 0    Sig: Take 0.5 tablets (0.25 mg total) by mouth at bedtime as needed for anxiety or sleep.     Not Delegated - Psychiatry:  Anxiolytics/Hypnotics Failed - 10/07/2018 11:29 AM      Failed - This refill cannot be delegated      Failed - Urine Drug Screen completed in last 360 days.      Passed - Valid encounter within last 6 months    Recent Outpatient Visits          3 weeks ago Acute maxillary sinusitis, recurrence not specified   [place] McLean-Scocuzza, Nino Glow, MD   1 month ago Acute sinusitis, recurrence not specified, unspecified location   [facility], Jacquelynn Cree, FNP   8 months ago Sensation of change in temperature   [facility] [name], MD   11 months ago Cervical radiculopathy   [name], MD   1 year ago Diarrhea, unspecified type   [facility] [name], MD      Future Appointments            In 2 months [name], Angela Adam, MD [facility], [facility]            Requested Prescriptions  Pending Prescriptions Disp Refills   ALPRAZolam (XANAX) 0.5 MG tablet 30 tablet 0    Sig: Take 0.5 tablets (0.25 mg total) by mouth at bedtime as needed for anxiety or sleep.     Not Delegated - Psychiatry:  Anxiolytics/Hypnotics Failed - 10/07/2018 11:29 AM      Failed - This refill cannot be delegated      Failed - Urine Drug Screen completed in last 360 days.      Passed - Valid encounter within last 6 months    Recent Outpatient Visits          3 weeks ago Acute maxillary sinusitis, recurrence not specified   [place] McLean-Scocuzza, Nino Glow, MD   1 month ago Acute sinusitis, recurrence not specified, unspecified location   [place], FNP   8 months ago Sensation of change in temperature   [facility] [name], MD   11 months ago Cervical radiculopathy   [facility] [name], MD   1 year ago Diarrhea, unspecified type   [facility] [name], MD      Future Appointments            In 2 months [name], Angela Adam, MD [facility], [facility]

## 2018-10-07 NOTE — Telephone Encounter (Signed)
Copied from [place] [id number]. Topic: Quick Communication - Rx Refill/Question >> Oct 07, 2018 11:02 AM [name] wrote: Medication: ALPRAZolam [name]) 0.5 MG tablet   Has the patient contacted their pharmacy?  (Agent: If no, request that the patient contact the pharmacy for the refill.) Needs a new prescription. Does not have any more of the medication   Preferred Pharmacy (with phone number or street name): [facility] - [address], [place] [phone] (Phone) [phone] (Fax)    Agent: Please be advised that RX refills may take up to 3 business days. We ask that you follow-up with your pharmacy.

## 2018-10-07 NOTE — Telephone Encounter (Signed)
Copied from [place] [phone]. Topic: Quick Communication - Rx Refill/Question >> Oct 07, 2018 11:12 AM Gustavus Messing wrote: Medication: ALPRAZolam Duanne Moron) 0.5 MG tablet [[id number]]   Has the patient contacted their pharmacy? No. (Agent: If no, request that the patient contact the pharmacy for the refill.) Patient needs new prescription. Patient is out of medication. (Agent: If yes, when and what did the pharmacy advise?)  Preferred Pharmacy (with phone number or street name): [facility] - [address], [place] [phone] (Phone) [phone] (Fax)    Agent: Please be advised that RX refills may take up to 3 business days. We ask that you follow-up with your pharmacy.

## 2018-10-07 NOTE — Telephone Encounter (Signed)
Copied from [place] [phone]. Topic: Quick Communication - Rx Refill/Question >> Oct 07, 2018 11:02 AM Gustavus Messing wrote: Medication: ALPRAZolam Duanne Moron) 0.5 MG tablet   Has the patient contacted their pharmacy? No. (Agent: If no, request that the patient contact the pharmacy for the refill.) Needs a new prescription. Does not have any more of the medication   Preferred Pharmacy (with phone number or street name): [facility] - [address], [place] [phone] (Phone) [phone] (Fax)    Agent: Please be advised that RX refills may take up to 3 business days. We ask that you follow-up with your pharmacy.

## 2018-10-10 ENCOUNTER — Telehealth: Payer: Self-pay | Admitting: Family Medicine

## 2018-10-10 NOTE — Telephone Encounter (Signed)
Copied from [place] [phone]. Topic: Quick Communication - See Telephone Encounter >> Oct 10, 2018 11:21 AM Bea Graff, NT wrote: CRM for notification. See Telephone encounter for: 10/10/18. Pt states she received a refill of the warfarin (COUMADIN) 1 MG tablet in the mail and she is not needing this medication at this time and was wondering why she received this. Suggested pt to contact her pharmacy but she states it came from her mail order.

## 2018-10-11 NOTE — Telephone Encounter (Signed)
Last OV 09/13/2018  Last refilled 08/02/2018 disp 30 with no refills   Next appt: 12/26/2018  Sent to PCP  to advise

## 2018-10-12 MED ORDER — ALPRAZOLAM 0.5 MG PO TABS: 0.2500 mg | ORAL_TABLET | Freq: Every evening | ORAL | 0 refills | Status: DC | PRN

## 2018-10-12 NOTE — Telephone Encounter (Signed)
Controlled substance database reviewed. Sent to pharmacy.  Patient needs to keep her upcoming follow-up to continue to get refills.

## 2018-10-12 NOTE — Telephone Encounter (Signed)
Called and spoke with patient. Pt advised that she will need [facility] to figure out what the issue may be. [facility] phone.

## 2018-10-26 DIAGNOSIS — Z7901 Long term (current) use of anticoagulants: Secondary | ICD-10-CM | POA: Diagnosis not present

## 2018-10-27 LAB — POCT INR: INR: 2.9 — AB (ref 0.9–1.1)

## 2018-11-01 ENCOUNTER — Telehealth: Payer: Self-pay | Admitting: Family Medicine

## 2018-11-01 NOTE — Telephone Encounter (Signed)
Please let the patient know that her INR is acceptable.  She should continue with her current Coumadin regimen and have this rechecked in weeks.  Thanks.

## 2018-11-01 NOTE — Telephone Encounter (Signed)
Called and spoke with pt. Pt advised and voiced understanding.  

## 2018-11-03 NOTE — Telephone Encounter (Signed)
[place] called nurse Wendi Maya called stating pt was confused about her INR needed clarification is she to continue same regimen?   Went over information with [employer]. Gaspar Bidding advised and asked for [place] to always call him in regards to her INR. Note in pt's chart.   Should she have this recheck in 1 or 2 week or 1 month?   Call [place]   Nurse foe independent living

## 2018-11-03 NOTE — Telephone Encounter (Signed)
Called [place] and left a detailed VM advised to call back if needed.

## 2018-11-03 NOTE — Telephone Encounter (Signed)
Please advise  PCP?

## 2018-11-03 NOTE — Telephone Encounter (Signed)
Recheck in 4 weeks 

## 2018-11-09 ENCOUNTER — Telehealth: Payer: Self-pay | Admitting: Family Medicine

## 2018-11-09 NOTE — Telephone Encounter (Signed)
Pharmacist, community for [facility] on [address]. Patient Prolia approved eceived automatic approval on [employer] portal.

## 2018-11-09 NOTE — Telephone Encounter (Signed)
Injection scheduled.

## 2018-11-11 ENCOUNTER — Telehealth: Payer: Self-pay | Admitting: General Surgery

## 2018-11-11 NOTE — Telephone Encounter (Signed)
Patient is calling about one of her medications. Patient said she no longer needs this medication but keeps getting the medication. Please call patient and advise.

## 2018-11-11 NOTE — Telephone Encounter (Signed)
Spoke with patient.

## 2018-11-17 ENCOUNTER — Other Ambulatory Visit: Payer: Self-pay

## 2018-11-17 ENCOUNTER — Ambulatory Visit (INDEPENDENT_AMBULATORY_CARE_PROVIDER_SITE_OTHER): Payer: Medicare Other

## 2018-11-17 DIAGNOSIS — M8580 Other specified disorders of bone density and structure, unspecified site: Secondary | ICD-10-CM

## 2018-11-17 MED ORDER — DENOSUMAB 60 MG/ML ~~LOC~~ SOSY
60.0000 mg | PREFILLED_SYRINGE | Freq: Once | SUBCUTANEOUS | Status: AC
  Administered 2018-11-17: 60 mg via SUBCUTANEOUS

## 2018-11-17 NOTE — Progress Notes (Signed)
Patient presented today for Prolia injection.  Administered [place] in right arm.  Patient tolerated well.

## 2018-11-22 DIAGNOSIS — Z7901 Long term (current) use of anticoagulants: Secondary | ICD-10-CM | POA: Diagnosis not present

## 2018-11-23 LAB — POCT INR: INR: 1.7 — AB (ref <=1.1)

## 2018-11-25 ENCOUNTER — Other Ambulatory Visit: Payer: Self-pay

## 2018-11-27 ENCOUNTER — Other Ambulatory Visit: Payer: Self-pay | Admitting: Family Medicine

## 2018-11-28 ENCOUNTER — Telehealth: Payer: Self-pay | Admitting: Family Medicine

## 2018-11-28 NOTE — Telephone Encounter (Signed)
Please let the RN at the patients facility know that her INR is low. Please confirm her current coumadin dosing as we will need to change the dose. Once I know her current dosing we will call back with recommendations. Also, the patient has not been seen since July 2019. She needs to have a follow-up appointment scheduled some time in the next 2 months. Thanks.

## 2018-11-28 NOTE — Telephone Encounter (Signed)
Last OV 09/13/2018  Pt takes both doses   warfarin (COUMADIN) 3 MG tablet 90 tablet 1 05/10/2018   warfarin (COUMADIN) 1 MG tablet 30 tablet 3 05/10/2018     Next OV 12/26/2018

## 2018-11-29 DIAGNOSIS — H3562 Retinal hemorrhage, left eye: Secondary | ICD-10-CM | POA: Diagnosis not present

## 2018-11-29 NOTE — Telephone Encounter (Signed)
Patient will need to take 4 mg on Tuesday and Saturday. She will take 3 mg Monday, Wednesday, Thursday, Friday, and Sunday. Please see if she has 1 mg tablets to take with her 3 mg tablet of coumadin to equal 4 mg. She will need her INR rechecked in 2 weeks. Thanks.

## 2018-11-29 NOTE — Telephone Encounter (Signed)
Called and spoke with RN Gaspar Bidding. Gaspar Bidding advised and voiced understanding. He will set pt up for recheck of INR in 2 weeks. He does believe that she has some of the 1 MG left over will call us back if an RX need to be sent into pharmacy. Thurman Coyer my direct number to call me back if needed.

## 2018-11-29 NOTE — Telephone Encounter (Signed)
Called and spoke with [name] pt has been taking 3 mg every night. Sent to PCP as an [place]. Will call pt later to schedule her for an appt.

## 2018-11-30 NOTE — Telephone Encounter (Signed)
Called pt and was unable to leave a VM to call back. Pt needed to be scheduled for a 2 month follow up with PCP.   [name] could you schedule pt I will be out on Thursday.   If not OK to send back to me.   Thanks

## 2018-12-02 ENCOUNTER — Telehealth: Payer: Self-pay

## 2018-12-02 NOTE — Telephone Encounter (Signed)
Last OV 09/13/2018  Last refilled  ALPRAZolam (XANAX) 0.5 MG tablet 30 tablet 0 10/12/2018     Next OV 12/26/2018  Sent to PCP

## 2018-12-03 MED ORDER — ALPRAZOLAM 0.5 MG PO TABS: 0.2500 mg | ORAL_TABLET | Freq: Every evening | ORAL | 0 refills | Status: DC | PRN

## 2018-12-03 NOTE — Telephone Encounter (Signed)
Refill sent to pharmacy.  Controlled substance database reviewed.

## 2018-12-03 NOTE — Addendum Note (Signed)
Addended by: [name], Marygrace Sandoval G on: 12/03/2018 02:01 PM   Modules accepted: Orders

## 2018-12-12 ENCOUNTER — Encounter: Payer: Self-pay | Admitting: Family Medicine

## 2018-12-12 ENCOUNTER — Other Ambulatory Visit: Payer: Self-pay

## 2018-12-12 ENCOUNTER — Ambulatory Visit (INDEPENDENT_AMBULATORY_CARE_PROVIDER_SITE_OTHER): Payer: Medicare Other | Admitting: Family Medicine

## 2018-12-12 DIAGNOSIS — M542 Cervicalgia: Secondary | ICD-10-CM

## 2018-12-12 DIAGNOSIS — H9202 Otalgia, left ear: Secondary | ICD-10-CM

## 2018-12-12 MED ORDER — BACLOFEN 10 MG PO TABS: 10.0000 mg | ORAL_TABLET | Freq: Three times a day (TID) | ORAL | 0 refills | Status: DC | PRN

## 2018-12-12 NOTE — Progress Notes (Signed)
Virtual Visit via video note  This visit type was conducted due to national recommendations for restrictions regarding the COVID-19 pandemic (e.g. social distancing).  This format is felt to be most appropriate for this patient at this time.  All issues noted in this document were discussed and addressed.  No physical exam was performed (except for noted visual exam findings with Video Visits).     I connected with [name] today at  3:15 PM EDT by a video enabled telemedicine application and verified that I am speaking with the correct person using two identifiers. Location patient: home Location provider: work or home office Persons participating in the virtual visit: patient, provider, Lovena Le (works at [employer])  I discussed the limitations, risks, security and privacy concerns of performing an evaluation and management service by telephone and the availability of in person appointments. I also discussed with the patient that there may be a patient responsible charge related to this service. The patient expressed understanding and agreed to proceed.  Reason for visit: Same day visit  HPI: Neck pain: Patient notes this started 3 to 4 days ago.  She notes it started all of a sudden and felt as though there was a tightening in the posterior lateral left portion of her neck.  She notes no injury.  She notes no radiation.  No numbness.  No weakness.  She does note she was mopping at her apartment.  She notes propping her head up does help.  She notes rotating to the left does cause some discomfort.  There is 1 area in the musculature that is tender though there is no overlying erythema.  She did take some older baclofen yesterday on 2 occasions with a 5 mg dose which helped minimally.  Ear pain: Patient does note some discomfort in her left ear this morning.  She notes she will occasionally hear a drum beat in the ear that comes and goes.  There is no drainage.  No fever.  No  congestion.  In the past she has had issues with earwax in her right ear.   ROS: See pertinent positives and negatives per HPI.  Past Medical History:  Diagnosis Date  . Allergy   . Breast cancer ([place]) 07/22/2012   T1c, Nx carcinoma left breast, ER 90%, PR 90%, HER-2/neu not over expressed.  . Hypertension   . Insomnia   . Osteoporosis    Prolia 10/2012  . Pulmonary embolus ([place]) 2009  . S/P IVC filter 2009    Past Surgical History:  Procedure Laterality Date  . BLADDER SURGERY    . BREAST SURGERY Left 2013   mastectomy  . CHOLECYSTECTOMY    . HERNIA REPAIR    . MASTECTOMY Left 2013   BREAST CA  . REPLACEMENT TOTAL KNEE     right  . TONSILLECTOMY      Family History  Problem Relation Age of Onset  . COPD Mother   . Stroke Father   . Cancer Father        breast  . Breast cancer Maternal Aunt     SOCIAL HX: Former smoker.   Current Outpatient Medications:  .  albuterol (PROVENTIL HFA;VENTOLIN HFA) 108 (90 Base) MCG/ACT inhaler, Inhale 2 puffs into the lungs every 6 (six) hours as needed for wheezing or shortness of breath., Disp: 1 Inhaler, Rfl: 1 .  ALPRAZolam (XANAX) 0.5 MG tablet, Take 0.5 tablets (0.25 mg total) by mouth at bedtime as needed for anxiety or sleep. Patient  must keep follow-up to receive further refills., Disp: 30 tablet, Rfl: 0 .  Biotin 10 MG TABS, Take by mouth., Disp: , Rfl:  .  bisoprolol (ZEBETA) 5 MG tablet, Take 1 tablet (5 mg total) by mouth daily., Disp: 90 tablet, Rfl: 1 .  Cholecalciferol (VITAMIN D PO), Take 2,000 mg by mouth daily., Disp: , Rfl:  .  denosumab (PROLIA) 60 MG/ML SOSY injection, Prolia 60 mg/mL subcutaneous syringe  Inject 1 mL by subcutaneous route., Disp: , Rfl:  .  diphenoxylate-atropine (LOMOTIL) 2.5-0.025 MG tablet, Take 1 tablet by mouth 4 (four) times daily as needed for diarrhea or loose stools., Disp: 30 tablet, Rfl: 0 .  fluticasone (FLONASE) 50 MCG/ACT nasal spray, Place 2 sprays into both nostrils daily., Disp:  16 g, Rfl: 6 .  hydrochlorothiazide (HYDRODIURIL) 12.5 MG tablet, Take 0.5 tablets (6.25 mg total) by mouth daily., Disp: 45 tablet, Rfl: 3 .  letrozole (FEMARA) 2.5 MG tablet, , Disp: , Rfl:  .  mirtazapine (REMERON) 7.5 MG tablet, Take 1 tablet (7.5 mg total) by mouth at bedtime., Disp: 90 tablet, Rfl: 1 .  warfarin (COUMADIN) 1 MG tablet, Take 1 mg by mouth on Monday, Wednesday, and Friday, Disp: 30 tablet, Rfl: 3 .  warfarin (COUMADIN) 3 MG tablet, TAKE 1 TABLET DAILY, Disp: 90 tablet, Rfl: 3 .  XIIDRA 5 % SOLN, , Disp: , Rfl:  .  baclofen (LIORESAL) 10 MG tablet, Take 1 tablet (10 mg total) by mouth 3 (three) times daily as needed for muscle spasms., Disp: 30 each, Rfl: 0  EXAM:  VITALS per patient if applicable: None.  GENERAL: alert, oriented, appears well and in no acute distress  HEENT: atraumatic, conjunttiva clear, no obvious abnormalities on inspection of external nose and ears  NECK: normal range of motion of the head and neck  LUNGS: on inspection no signs of respiratory distress, breathing rate appears normal, no obvious gross SOB, gasping or wheezing  CV: no obvious cyanosis  MS: moves all visible extremities without noticeable abnormality, patient is able to stand up under her own strength against gravity, she is able to raise her arms up above her head against gravity  PSYCH/NEURO: pleasant and cooperative, no obvious depression or anxiety, speech and thought processing grossly intact  ASSESSMENT AND PLAN:  Discussed the following assessment and plan:  Neck pain  Left ear pain  Neck pain I suspect this is a muscle strain.  Discussed rest with heat.  Discussed not using heat for too long given risk of potential burns from heating pads.  I will send baclofen in for her to take at a slightly higher dose and her facility will try to figure out a way to pick this up.  She will monitor for drowsiness with this.  If she is unable to get this filled she will take the  baclofen that she has currently with an expiration date of 11/30/2018.  We will follow-up with her in 1 week regarding this.  She is given return precautions.  Ear pain Potentially could be related to her neck pain though could also represent some additional cause.  We will treat her neck pain as described above and if her ear discomfort is not improving we would need to see her in the office for an exam.  If worsening in any manner she will seek medical attention.    I discussed the assessment and treatment plan with the patient. The patient was provided an opportunity to ask questions and all  were answered. The patient agreed with the plan and demonstrated an understanding of the instructions.   The patient was advised to call back or seek an in-person evaluation if the symptoms worsen or if the condition fails to improve as anticipated.   [name], MD

## 2018-12-12 NOTE — Progress Notes (Signed)
Pt is c/o neck and ear pain. Pt is c/o left side of neck and shoulder pain. Started on Friday after mopping her floors.  Pt is c/o left ear pain sounds like a drum per pt. Pt stated that her pain hurt so bad she took half a tablet of baclofen that she had left 85 years old per pt.

## 2018-12-13 ENCOUNTER — Telehealth: Payer: Self-pay | Admitting: Family Medicine

## 2018-12-13 DIAGNOSIS — H9209 Otalgia, unspecified ear: Secondary | ICD-10-CM | POA: Insufficient documentation

## 2018-12-13 DIAGNOSIS — M542 Cervicalgia: Secondary | ICD-10-CM | POA: Insufficient documentation

## 2018-12-13 NOTE — Assessment & Plan Note (Signed)
I suspect this is a muscle strain.  Discussed rest with heat.  Discussed not using heat for too long given risk of potential burns from heating pads.  I will send baclofen in for her to take at a slightly higher dose and her facility will try to figure out a way to pick this up.  She will monitor for drowsiness with this.  If she is unable to get this filled she will take the baclofen that she has currently with an expiration date of 11/30/2018.  We will follow-up with her in 1 week regarding this.  She is given return precautions.

## 2018-12-13 NOTE — Assessment & Plan Note (Signed)
Potentially could be related to her neck pain though could also represent some additional cause.  We will treat her neck pain as described above and if her ear discomfort is not improving we would need to see her in the office for an exam.  If worsening in any manner she will seek medical attention.

## 2018-12-13 NOTE — Telephone Encounter (Signed)
Please call the patient and get her set up for follow-up in 1 week.  This could be a telephone or doxy visit.

## 2018-12-13 NOTE — Telephone Encounter (Signed)
Appt scheduled

## 2018-12-21 ENCOUNTER — Telehealth: Payer: Self-pay | Admitting: *Deleted

## 2018-12-21 NOTE — Telephone Encounter (Signed)
Copied from [facility] [id number]. Topic: Appointment Scheduling - Scheduling Inquiry for Clinic >> Dec 21, 2018 11:46 AM Rayann Heman wrote: Reason for CRM: pt called and stated that she would like a call back about duplicate appointments. Please advise

## 2018-12-22 DIAGNOSIS — Z7901 Long term (current) use of anticoagulants: Secondary | ICD-10-CM | POA: Diagnosis not present

## 2018-12-23 LAB — PROTIME-INR: INR: 3 — AB (ref 0.9–1.1)

## 2018-12-26 ENCOUNTER — Ambulatory Visit: Payer: Medicare Other | Admitting: Family Medicine

## 2018-12-28 ENCOUNTER — Ambulatory Visit: Payer: Medicare Other | Admitting: Family Medicine

## 2019-01-01 ENCOUNTER — Telehealth: Payer: Self-pay | Admitting: Family Medicine

## 2019-01-01 NOTE — Telephone Encounter (Signed)
Please contact the RN at the patient's facility to let them know that her INR was 3.0 and this is acceptable.  She needs to continue with her current Coumadin regimen and we should recheck this in 2 weeks.  Thanks.  The number to contact them is [phone]

## 2019-01-02 NOTE — Telephone Encounter (Signed)
Called and spoke to pt.  Pt was not sure why her appt was cancelled for last week.  Rescheduled pt a f/u phone appt on 01/04/19.  Pt said that she also on some days has some swelling during different times of the day in her ankles mostly her right.    Pt requested that PCP no longer refill the Rx for Baclofen.  Pt said it does not help her neck.    Also pt requests PCP to put DOB on her Rx when sending them in for refills.

## 2019-01-02 NOTE — Telephone Encounter (Signed)
Called and spoke to pt.  Advised to call office if swelling in ankles worsen per PCP's note.

## 2019-01-02 NOTE — Telephone Encounter (Signed)
Aaron Edelman RN for [employer]  Given results will recheck in 2 weeks.

## 2019-01-02 NOTE — Telephone Encounter (Signed)
Noted.  We can discuss with swelling during her office visit.  If it worsens she needs to let me know.  Her prescriptions typically should include her date of birth note he signed them through the computer.

## 2019-01-04 ENCOUNTER — Other Ambulatory Visit: Payer: Self-pay

## 2019-01-04 ENCOUNTER — Encounter: Payer: Self-pay | Admitting: Family Medicine

## 2019-01-04 ENCOUNTER — Ambulatory Visit (INDEPENDENT_AMBULATORY_CARE_PROVIDER_SITE_OTHER): Payer: Medicare Other | Admitting: Family Medicine

## 2019-01-04 DIAGNOSIS — Z853 Personal history of malignant neoplasm of breast: Secondary | ICD-10-CM

## 2019-01-04 DIAGNOSIS — I10 Essential (primary) hypertension: Secondary | ICD-10-CM | POA: Diagnosis not present

## 2019-01-04 DIAGNOSIS — M25571 Pain in right ankle and joints of right foot: Secondary | ICD-10-CM | POA: Diagnosis not present

## 2019-01-04 DIAGNOSIS — M25579 Pain in unspecified ankle and joints of unspecified foot: Secondary | ICD-10-CM | POA: Insufficient documentation

## 2019-01-04 DIAGNOSIS — Z86711 Personal history of pulmonary embolism: Secondary | ICD-10-CM | POA: Diagnosis not present

## 2019-01-04 MED ORDER — BISOPROLOL FUMARATE 5 MG PO TABS: 5.0000 mg | ORAL_TABLET | Freq: Every day | ORAL | 1 refills | Status: DC

## 2019-01-04 NOTE — Assessment & Plan Note (Signed)
Suspect this could represent osteoarthritis and mild tightness related to that.  Discussed icing it with a bag of frozen peas when this occurs.  If not improving she will let us know.

## 2019-01-04 NOTE — Assessment & Plan Note (Signed)
Adequately controlled.  I discussed that the patient could try coming off of the hydrochlorothiazide given that it is difficult for her to split.  She will monitor her blood pressures.  If they trend up she will let us know.  We will have her follow-up in 1 month.

## 2019-01-04 NOTE — Assessment & Plan Note (Signed)
Continue Coumadin.  Updated her on her most recent INR.

## 2019-01-04 NOTE — Progress Notes (Signed)
Virtual Visit via telephone Note  This visit type was conducted due to national recommendations for restrictions regarding the COVID-19 pandemic (e.g. social distancing).  This format is felt to be most appropriate for this patient at this time.  All issues noted in this document were discussed and addressed.  No physical exam was performed (except for noted visual exam findings with Video Visits).   I connected with [name] today at  8:30 AM EDT by a telephone and verified that I am speaking with the correct person using two identifiers. Location patient: home Location provider: work  Persons participating in the virtual visit: patient, provider  I discussed the limitations, risks, security and privacy concerns of performing an evaluation and management service by telephone and the availability of in person appointments. I also discussed with the patient that there may be a patient responsible charge related to this service. The patient expressed understanding and agreed to proceed.  Interactive audio and video telecommunications were attempted between this provider and patient, however failed, due to patient having technical difficulties OR patient did not have access to video capability.  We continued and completed visit with audio only.  Reason for visit: follow-up  HPI: Hypertension: Typically 120s-140s/75-80.  Taking bisoprolol and HCTZ.  She has had a hard time splitting the HCTZ tablets.  No chest pain, shortness breath, or edema.  Right ankle tightness: Patient notes her right ankle feels tight at times when she is bending or vacuuming.  She notes this has been going on intermittently for about a month.  She notes there is no pain or redness.  No swelling.  No injury.  History of PE: No recurrent symptoms.  She is taking Coumadin.  No bleeding issues.  History of breast cancer: No breast pain, nipple discharge, or masses.  She continues to follow with general surgery.  She is  no longer on letrozole.  Most recent mammogram was negative.   ROS: See pertinent positives and negatives per HPI.  Past Medical History:  Diagnosis Date  . Allergy   . Breast cancer ([place]) 07/22/2012   T1c, Nx carcinoma left breast, ER 90%, PR 90%, HER-2/neu not over expressed.  . Hypertension   . Insomnia   . Osteoporosis    Prolia 10/2012  . Pulmonary embolus ([place]) 2009  . S/P IVC filter 2009    Past Surgical History:  Procedure Laterality Date  . BLADDER SURGERY    . BREAST SURGERY Left 2013   mastectomy  . CHOLECYSTECTOMY    . HERNIA REPAIR    . MASTECTOMY Left 2013   BREAST CA  . REPLACEMENT TOTAL KNEE     right  . TONSILLECTOMY      Family History  Problem Relation Age of Onset  . COPD Mother   . Stroke Father   . Cancer Father        breast  . Breast cancer Maternal Aunt     SOCIAL HX: Former smoker.   Current Outpatient Medications:  .  albuterol (PROVENTIL HFA;VENTOLIN HFA) 108 (90 Base) MCG/ACT inhaler, Inhale 2 puffs into the lungs every 6 (six) hours as needed for wheezing or shortness of breath., Disp: 1 Inhaler, Rfl: 1 .  ALPRAZolam (XANAX) 0.5 MG tablet, Take 0.5 tablets (0.25 mg total) by mouth at bedtime as needed for anxiety or sleep. Patient must keep follow-up to receive further refills., Disp: 30 tablet, Rfl: 0 .  baclofen (LIORESAL) 10 MG tablet, Take 1 tablet (10 mg total) by mouth  3 (three) times daily as needed for muscle spasms., Disp: 30 each, Rfl: 0 .  Biotin 10 MG TABS, Take by mouth., Disp: , Rfl:  .  bisoprolol (ZEBETA) 5 MG tablet, Take 1 tablet (5 mg total) by mouth daily., Disp: 90 tablet, Rfl: 1 .  Cholecalciferol (VITAMIN D PO), Take 2,000 mg by mouth daily., Disp: , Rfl:  .  denosumab (PROLIA) 60 MG/ML SOSY injection, Prolia 60 mg/mL subcutaneous syringe  Inject 1 mL by subcutaneous route., Disp: , Rfl:  .  diphenoxylate-atropine (LOMOTIL) 2.5-0.025 MG tablet, Take 1 tablet by mouth 4 (four) times daily as needed for diarrhea or  loose stools., Disp: 30 tablet, Rfl: 0 .  fluticasone (FLONASE) 50 MCG/ACT nasal spray, Place 2 sprays into both nostrils daily., Disp: 16 g, Rfl: 6 .  letrozole (FEMARA) 2.5 MG tablet, , Disp: , Rfl:  .  mirtazapine (REMERON) 7.5 MG tablet, Take 1 tablet (7.5 mg total) by mouth at bedtime., Disp: 90 tablet, Rfl: 1 .  warfarin (COUMADIN) 1 MG tablet, Take 1 mg by mouth on Monday, Wednesday, and Friday, Disp: 30 tablet, Rfl: 3 .  warfarin (COUMADIN) 3 MG tablet, TAKE 1 TABLET DAILY, Disp: 90 tablet, Rfl: 3 .  XIIDRA 5 % SOLN, , Disp: , Rfl:   EXAM: This was a telehealth telephone visit and thus no physical exam was completed.  ASSESSMENT AND PLAN:  Discussed the following assessment and plan:  Essential hypertension - Plan: bisoprolol (ZEBETA) 5 MG tablet  History of breast cancer  History of pulmonary embolus (PE)  Right ankle pain, unspecified chronicity  Hypertension Adequately controlled.  I discussed that the patient could try coming off of the hydrochlorothiazide given that it is difficult for her to split.  She will monitor her blood pressures.  If they trend up she will let us know.  We will have her follow-up in 1 month.  History of breast cancer Patient will continue to follow with general surgery.  History of pulmonary embolus (PE) Continue Coumadin.  Updated her on her most recent INR.  Ankle tightness Suspect this could represent osteoarthritis and mild tightness related to that.  Discussed icing it with a bag of frozen peas when this occurs.  If not improving she will let us know.  [facility] office staff will contact the patient to get her scheduled for follow-up in 1 month.  Social distancing precautions and sick precautions given regarding COVID-19.   I discussed the assessment and treatment plan with the patient. The patient was provided an opportunity to ask questions and all were answered. The patient agreed with the plan and demonstrated an understanding of the  instructions.   The patient was advised to call back or seek an in-person evaluation if the symptoms worsen or if the condition fails to improve as anticipated.  I provided 22 minutes of non-face-to-face time during this encounter.   [name], MD

## 2019-01-04 NOTE — Assessment & Plan Note (Signed)
Patient will continue to follow with general surgery.

## 2019-01-24 DIAGNOSIS — Z7901 Long term (current) use of anticoagulants: Secondary | ICD-10-CM | POA: Diagnosis not present

## 2019-01-24 LAB — PROTIME-INR: Protime: 24.9 — AB (ref 10.0–13.8)

## 2019-01-24 LAB — POCT INR: INR: 2.5 — AB (ref <=1.1)

## 2019-02-02 ENCOUNTER — Other Ambulatory Visit: Payer: Self-pay | Admitting: Otolaryngology

## 2019-02-02 ENCOUNTER — Other Ambulatory Visit: Payer: Self-pay | Admitting: General Surgery

## 2019-02-02 DIAGNOSIS — H9313 Tinnitus, bilateral: Secondary | ICD-10-CM | POA: Diagnosis not present

## 2019-02-02 DIAGNOSIS — H6061 Unspecified chronic otitis externa, right ear: Secondary | ICD-10-CM | POA: Diagnosis not present

## 2019-02-02 DIAGNOSIS — H9312 Tinnitus, left ear: Secondary | ICD-10-CM

## 2019-02-02 DIAGNOSIS — H6123 Impacted cerumen, bilateral: Secondary | ICD-10-CM | POA: Diagnosis not present

## 2019-02-06 ENCOUNTER — Telehealth: Payer: Self-pay | Admitting: Family Medicine

## 2019-02-06 NOTE — Telephone Encounter (Signed)
Medication Refill - Medication:Has the patient contacted their p  harmacy? No. (Agent: If no, request that the patient contact the pharmacy for the refill.)   Preferred Pharmacy (with phone number or street name):  [facility], [place] [phone] (Phone) [phone] (Fax)     Agent: Please be advised that RX refills may take up to 3 business days. We ask that you follow-up with your pharmacy. The patient called to let her doctor to know that her meds are running low for 3 of her medications   warfarin (COUMADIN) 3 MG tablet [[id number]]   ALPRAZolam (XANAX) 0.5 MG tablet [[id number]]   hydrochlorothiazide (HYDRODIURIL) 12.5 MG tablet [[id number]]      Please fill so that she does not want to run out.

## 2019-02-07 MED ORDER — ALPRAZOLAM 0.5 MG PO TABS: 0.2500 mg | ORAL_TABLET | Freq: Every evening | ORAL | 0 refills | Status: DC | PRN

## 2019-02-07 MED ORDER — WARFARIN SODIUM 1 MG PO TABS: ORAL_TABLET | ORAL | 3 refills | Status: DC

## 2019-02-07 MED ORDER — WARFARIN SODIUM 3 MG PO TABS: 3.0000 mg | ORAL_TABLET | Freq: Every day | ORAL | 3 refills | Status: DC

## 2019-02-07 NOTE — Telephone Encounter (Signed)
We previously discontinued the hydrochlorothiazide at her most recent visit.  Please see if she has continued to take this.  Please see if she has been checking her blood pressure.  Her other medications were sent to her mail order pharmacy.

## 2019-02-07 NOTE — Telephone Encounter (Signed)
Last OV:  01/04/19  Last PT-INR:  01/24/19  Last Labs with TSH over a year:  09/13/18  Not sure which Rx for coumadin to refill.

## 2019-02-07 NOTE — Telephone Encounter (Signed)
Relation to pt: self  Call back number:[phone]  Pharmacy:  [facility], [place] [phone] (Phone) [phone] (Fax)    Reason for call:  Patient states she has a few pills left and would like to know if medication can be expedited

## 2019-02-08 NOTE — Telephone Encounter (Signed)
She can stop the HCTZ. We discussed this at her last visit. She will need to have her BP checked in one week and let us know what her BP was. Her most recent INR was 2.5. She needs to continue with her current dosing of coumadin. Please let Aaron Edelman at [facility] know this as well. Noted regarding the hearing and she should complete the evaluation through ENT.

## 2019-02-08 NOTE — Telephone Encounter (Signed)
Pt said that she is still taking the hydrochlorothiazide.  She said that she is taking 1/2 of the tablet. Patient wants to know if she should stop the hydrochlorothiazide.  Pt said that the nurse Aaron Edelman is checking her bp at [facility].  Pt said that the nurse checks whenever she asked.  Pt said that her bp was last checked a few weeks ago.  Pt does not remember the bp reading.  Pt said that she has not heard anything from the nurse at [facility] about her last monthly blood draw concerning her coumadin dosage.  Pt wants to know how much coumadin she needs to take.    Patient also said that she is still having ear problems. She c/o of hearing popping noises in her ear describing the noise as a "BB gun sound".  Pt said that she has an imaging appt for ears on this Friday, July 17.  Pt said that ENT has given her ear drops.  Pt also wanted PCP to know that she took a baclofen tablet for neck pain and it really didn't help very much.

## 2019-02-09 NOTE — Telephone Encounter (Signed)
Called to speak with pt. Pt was unable to hear on phone.  Pt requested a call back so she could answer on the other phone.

## 2019-02-09 NOTE — Telephone Encounter (Signed)
Called pt gave instructions per PCP's note.  Pt voiced understanding with no questions.

## 2019-02-10 ENCOUNTER — Other Ambulatory Visit: Payer: Self-pay

## 2019-02-10 ENCOUNTER — Ambulatory Visit
Admission: RE | Admit: 2019-02-10 | Discharge: 2019-02-10 | Disposition: A | Discharge status: Home / Self Care | Payer: Medicare Other | Source: Ambulatory Visit | Attending: Otolaryngology | Admitting: Otolaryngology

## 2019-02-10 DIAGNOSIS — H9312 Tinnitus, left ear: Secondary | ICD-10-CM | POA: Insufficient documentation

## 2019-02-10 DIAGNOSIS — I6523 Occlusion and stenosis of bilateral carotid arteries: Secondary | ICD-10-CM | POA: Diagnosis not present

## 2019-02-13 DIAGNOSIS — L814 Other melanin hyperpigmentation: Secondary | ICD-10-CM | POA: Diagnosis not present

## 2019-02-13 DIAGNOSIS — L821 Other seborrheic keratosis: Secondary | ICD-10-CM | POA: Diagnosis not present

## 2019-02-13 DIAGNOSIS — D2262 Melanocytic nevi of left upper limb, including shoulder: Secondary | ICD-10-CM | POA: Diagnosis not present

## 2019-02-13 DIAGNOSIS — D485 Neoplasm of uncertain behavior of skin: Secondary | ICD-10-CM | POA: Diagnosis not present

## 2019-02-13 DIAGNOSIS — D2271 Melanocytic nevi of right lower limb, including hip: Secondary | ICD-10-CM | POA: Diagnosis not present

## 2019-02-13 DIAGNOSIS — D225 Melanocytic nevi of trunk: Secondary | ICD-10-CM | POA: Diagnosis not present

## 2019-02-13 DIAGNOSIS — D2272 Melanocytic nevi of left lower limb, including hip: Secondary | ICD-10-CM | POA: Diagnosis not present

## 2019-02-13 DIAGNOSIS — D2261 Melanocytic nevi of right upper limb, including shoulder: Secondary | ICD-10-CM | POA: Diagnosis not present

## 2019-02-17 NOTE — Telephone Encounter (Signed)
Spoke w/ Aaron Edelman at [facility].  He's aware that pt has been told to stop HCTZ and remain on current coumadin dosage.  Requested pt's bp be checked once a week.  Aaron Edelman said that he will fax over weekly BP checks starting on Monday.

## 2019-02-20 ENCOUNTER — Encounter: Payer: Self-pay | Admitting: General Surgery

## 2019-02-21 ENCOUNTER — Other Ambulatory Visit: Payer: Self-pay | Admitting: Otolaryngology

## 2019-02-21 DIAGNOSIS — H93A3 Pulsatile tinnitus, bilateral: Secondary | ICD-10-CM | POA: Diagnosis not present

## 2019-02-22 DIAGNOSIS — I480 Paroxysmal atrial fibrillation: Secondary | ICD-10-CM | POA: Diagnosis not present

## 2019-02-22 DIAGNOSIS — I10 Essential (primary) hypertension: Secondary | ICD-10-CM | POA: Diagnosis not present

## 2019-02-22 DIAGNOSIS — M810 Age-related osteoporosis without current pathological fracture: Secondary | ICD-10-CM

## 2019-02-22 DIAGNOSIS — Z95828 Presence of other vascular implants and grafts: Secondary | ICD-10-CM | POA: Diagnosis not present

## 2019-02-22 DIAGNOSIS — Z86711 Personal history of pulmonary embolism: Secondary | ICD-10-CM | POA: Diagnosis not present

## 2019-02-22 DIAGNOSIS — R42 Dizziness and giddiness: Secondary | ICD-10-CM | POA: Diagnosis not present

## 2019-02-22 DIAGNOSIS — Z7983 Long term (current) use of bisphosphonates: Secondary | ICD-10-CM | POA: Diagnosis not present

## 2019-02-22 DIAGNOSIS — Z7901 Long term (current) use of anticoagulants: Secondary | ICD-10-CM

## 2019-02-22 DIAGNOSIS — F418 Other specified anxiety disorders: Secondary | ICD-10-CM | POA: Diagnosis not present

## 2019-02-23 DIAGNOSIS — Z7901 Long term (current) use of anticoagulants: Secondary | ICD-10-CM | POA: Diagnosis not present

## 2019-02-24 LAB — POCT INR: INR: 2.3 — AB (ref 0.9–1.1)

## 2019-03-01 ENCOUNTER — Telehealth: Payer: Self-pay

## 2019-03-01 NOTE — Telephone Encounter (Signed)
Please let the patient and Iantha Fallen RN at her facility know that her INR is 2.3.  She should continue with her current Coumadin dosing.  This should be rechecked in 1 month.  Thanks.

## 2019-03-02 NOTE — Telephone Encounter (Addendum)
And spoke to patient and informed her that her INR was 2.3 and to continue her current regimen for coumadin. Will recheck in a month, could not reach brian but left a message for brian on voicemail to speak with the patient about her INR results.  Nina,cma Pt understood.

## 2019-03-06 ENCOUNTER — Ambulatory Visit
Admission: RE | Admit: 2019-03-06 | Discharge: 2019-03-06 | Disposition: A | Discharge status: Home / Self Care | Payer: Medicare Other | Source: Ambulatory Visit

## 2019-03-06 ENCOUNTER — Ambulatory Visit
Admission: RE | Admit: 2019-03-06 | Discharge: 2019-03-06 | Disposition: A | Discharge status: Home / Self Care | Payer: Medicare Other | Source: Ambulatory Visit | Attending: Otolaryngology | Admitting: Otolaryngology

## 2019-03-06 ENCOUNTER — Other Ambulatory Visit: Payer: Self-pay

## 2019-03-06 DIAGNOSIS — H93A3 Pulsatile tinnitus, bilateral: Secondary | ICD-10-CM | POA: Diagnosis not present

## 2019-03-06 DIAGNOSIS — H93A2 Pulsatile tinnitus, left ear: Secondary | ICD-10-CM | POA: Diagnosis not present

## 2019-03-06 LAB — POCT I-STAT CREATININE: Creatinine, Ser: 1 mg/dL (ref 0.44–1.00)

## 2019-03-06 MED ORDER — GADOBUTROL 1 MMOL/ML IV SOLN
5.0000 mL | Freq: Once | INTRAVENOUS | Status: AC | PRN
  Administered 2019-03-06: 5 mL via INTRAVENOUS

## 2019-03-08 ENCOUNTER — Ambulatory Visit: Admission: RE | Admit: 2019-03-08 | Payer: Medicare Other | Source: Ambulatory Visit

## 2019-03-08 ENCOUNTER — Ambulatory Visit: Payer: Medicare Other

## 2019-03-10 DIAGNOSIS — G250 Essential tremor: Secondary | ICD-10-CM | POA: Diagnosis not present

## 2019-03-10 DIAGNOSIS — H9313 Tinnitus, bilateral: Secondary | ICD-10-CM | POA: Diagnosis not present

## 2019-03-10 DIAGNOSIS — I613 Nontraumatic intracerebral hemorrhage in brain stem: Secondary | ICD-10-CM | POA: Diagnosis not present

## 2019-03-10 DIAGNOSIS — R9089 Other abnormal findings on diagnostic imaging of central nervous system: Secondary | ICD-10-CM | POA: Diagnosis not present

## 2019-03-15 ENCOUNTER — Telehealth: Payer: Self-pay | Admitting: Family Medicine

## 2019-03-15 NOTE — Telephone Encounter (Signed)
Pt stated she saw her neurologist today and he told her she needed to be put on BP medication and that it should come from her PCP.  Also requesting refill for singular. Not on current med list.  [place], [place] - [address] [phone] (Phone) [phone] (Fax)

## 2019-03-15 NOTE — Telephone Encounter (Signed)
Pt. States she saw her neurologist Friday and BP was 153/89. States the nurse at [facility] has only checked her BP once, when they should be checking it weekly. States the neurologist said she needs to be on BP medication. Please advise pt.

## 2019-03-16 NOTE — Telephone Encounter (Signed)
Pt. States she saw her neurologist Friday and BP was 153/89. States the nurse at [facility] has only checked her BP once, when they should be checking it weekly. States the neurologist said she needs to be on BP medication. Please advise pt.

## 2019-03-16 NOTE — Telephone Encounter (Signed)
Her BP was previously adequately controlled. Please see if they can check her BP at [facility] and contact us with the reading so I can determine if she needs to go back on BP medication. Thanks.

## 2019-03-17 MED ORDER — FLUTICASONE PROPIONATE 50 MCG/ACT NA SUSP: 2.0000 | Freq: Every day | NASAL | 6 refills | Status: DC

## 2019-03-17 NOTE — Telephone Encounter (Signed)
Noted.  Will await his call back.  I sent the Flonase in.  Please see if she is requesting any other allergy medications.  If she is requesting other allergy medications please find out why.

## 2019-03-17 NOTE — Telephone Encounter (Signed)
Spoke with patient and with Insurance underwriter at [facility] she will have [name] check patient BP today, Monday and Tuesday and call office with readings if out of range or extremely high will call back immediately or if patient develops any symptoms, if just elevated Gaspar Bidding will call next week 03/22/19 with reading taken over several days at different times.

## 2019-03-17 NOTE — Telephone Encounter (Signed)
Pt calling to check status. Pt states that she would like a call back from the nurse today if possible. Please advise.   Pt is very upset that no one has called her. Pt states that she wants to have BP pills. Pt states that she does not want to fall on the floor and die.   Pt also states that she needs some allergy medication called in.     [phone]

## 2019-03-20 ENCOUNTER — Other Ambulatory Visit: Payer: Self-pay

## 2019-03-20 ENCOUNTER — Ambulatory Visit (INDEPENDENT_AMBULATORY_CARE_PROVIDER_SITE_OTHER): Payer: Medicare Other | Admitting: Family Medicine

## 2019-03-20 ENCOUNTER — Encounter: Payer: Self-pay | Admitting: Family Medicine

## 2019-03-20 VITALS — BP 140/80 | HR 71 | Temp 98.5°F | Ht 60.0 in | Wt 125.4 lb

## 2019-03-20 DIAGNOSIS — J301 Allergic rhinitis due to pollen: Secondary | ICD-10-CM | POA: Diagnosis not present

## 2019-03-20 DIAGNOSIS — I10 Essential (primary) hypertension: Secondary | ICD-10-CM | POA: Diagnosis not present

## 2019-03-20 DIAGNOSIS — R600 Localized edema: Secondary | ICD-10-CM

## 2019-03-20 DIAGNOSIS — Z86711 Personal history of pulmonary embolism: Secondary | ICD-10-CM

## 2019-03-20 DIAGNOSIS — I613 Nontraumatic intracerebral hemorrhage in brain stem: Secondary | ICD-10-CM | POA: Diagnosis not present

## 2019-03-20 DIAGNOSIS — Z23 Encounter for immunization: Secondary | ICD-10-CM

## 2019-03-20 MED ORDER — MONTELUKAST SODIUM 10 MG PO TABS: 10.0000 mg | ORAL_TABLET | Freq: Every day | ORAL | 3 refills | Status: DC

## 2019-03-20 MED ORDER — AMLODIPINE BESYLATE 2.5 MG PO TABS: 2.5000 mg | ORAL_TABLET | Freq: Every day | ORAL | 3 refills | Status: DC

## 2019-03-20 NOTE — Patient Instructions (Signed)
Nice to see you. Please continue to check your BP. We will call with lab results. We will get you to see hematology to determine the need for continued coumadin. We will start you on amlodipine for BP.

## 2019-03-21 DIAGNOSIS — I613 Nontraumatic intracerebral hemorrhage in brain stem: Secondary | ICD-10-CM | POA: Insufficient documentation

## 2019-03-21 LAB — CBC
HCT: 42.7 % (ref 36.0–46.0)
Hemoglobin: 14.2 g/dL (ref 12.0–15.0)
MCHC: 33.2 g/dL (ref 30.0–36.0)
MCV: 90.9 fl (ref 78.0–100.0)
Platelets: 245 10*3/uL (ref 150.0–400.0)
RBC: 4.7 Mil/uL (ref 3.87–5.11)
RDW: 14.2 % (ref 11.5–15.5)
WBC: 6.5 10*3/uL (ref 4.0–10.5)

## 2019-03-21 LAB — COMPREHENSIVE METABOLIC PANEL
ALT: 8 U/L (ref 0–35)
AST: 14 U/L (ref 0–37)
Albumin: 4.2 g/dL (ref 3.5–5.2)
Alkaline Phosphatase: 36 U/L — ABNORMAL LOW (ref 39–117)
BUN: 16 mg/dL (ref 6–23)
CO2: 25 mEq/L (ref 19–32)
Calcium: 9.4 mg/dL (ref 8.4–10.5)
Chloride: 106 mEq/L (ref 96–112)
Creatinine, Ser: 0.96 mg/dL (ref 0.40–1.20)
GFR: 54.42 mL/min — ABNORMAL LOW (ref >60.00)
Glucose, Bld: 94 mg/dL (ref 70–99)
Potassium: 3.8 mEq/L (ref 3.5–5.1)
Sodium: 139 mEq/L (ref 135–145)
Total Bilirubin: 0.6 mg/dL (ref 0.2–1.2)
Total Protein: 6.8 g/dL (ref 6.0–8.3)

## 2019-03-21 LAB — TSH: TSH: 4.92 u[IU]/mL — ABNORMAL HIGH (ref 0.35–4.50)

## 2019-03-21 LAB — PROTIME-INR
INR: 1.5 — ABNORMAL HIGH
Prothrombin Time: 15.6 s — ABNORMAL HIGH (ref 9.0–11.5)

## 2019-03-21 NOTE — Telephone Encounter (Signed)
No other allergy.

## 2019-03-21 NOTE — Assessment & Plan Note (Signed)
She will continue her Coumadin for now.  We will refer her to hematology to determine if she needs to continue with this.  Check INR.

## 2019-03-21 NOTE — Progress Notes (Signed)
[name], MD Phone: [phone]  [name] is a 85 y.o. female who presents today for follow-up.  Hypertension: Typically in the 130s over 80s has been up into the 150s at other physician's offices.  No chest pain, shortness of breath, orthopnea.  She does note some mild puffiness in her feet and ankles for several months.  She is currently on Zebeta.  Chronic Pontine micro hemorrhage: She underwent evaluation for tinnitus with ENT and they did reveal a pontine hemorrhage.  She subsequently saw neurology who felt this was related to hypertension plus warfarin plus age.  They counseled her on the risks of being on Coumadin particularly with regards to subdural hematoma given her age.  They recommended that she monitor her blood pressure closely.  Allergic rhinitis: This is a chronic issue.  She is not having current issues.  She does take Flonase occasionally.  She has been out of Singulair for about a month though that was quite beneficial.  History of PE: She notes one episode of PE about 10 years ago.  She notes it was quite extensive and included multiple areas in both lungs.  She has been on Coumadin since then.  She had no inciting event at that time.  She does have an IVC filter in place and she did see vascular surgery years ago and they discussed taking it out though she does not want it removed.  Social History   Tobacco Use  Smoking Status Former Smoker  . Quit date: 05/13/1999  . Years since quitting: 19.8  Smokeless Tobacco Never Used     ROS see history of present illness  Objective  Physical Exam Vitals:   03/20/19 1441  BP: 140/80  Pulse: 71  Temp: 98.5 F (36.9 C)  SpO2: 97%    BP Readings from Last 3 Encounters:  03/20/19 140/80  09/13/18 118/78  09/01/18 108/62   Wt Readings from Last 3 Encounters:  03/20/19 125 lb 6.4 oz (56.9 kg)  09/13/18 127 lb (57.6 kg)  09/01/18 125 lb 6.4 oz (56.9 kg)    Physical Exam Constitutional:    General: She is not in acute distress.    Appearance: She is not diaphoretic.  Cardiovascular:     Rate and Rhythm: Normal rate and regular rhythm.     Heart sounds: Normal heart sounds.  Pulmonary:     Effort: Pulmonary effort is normal.     Breath sounds: Normal breath sounds.  Musculoskeletal:     Comments: Trace pitting edema at bilateral ankles  Skin:    General: Skin is warm and dry.  Neurological:     Mental Status: She is alert.      Assessment/Plan: Please see individual problem list.  Hypertension Slightly above goal at times.  With a recent MRI revealing a microhemorrhage we will try to treat to goal of 130/80 or less.  We will start her on a low-dose of amlodipine.  She will continue Zebeta.  Follow-up in a month for recheck.  Seasonal allergic rhinitis Continue Flonase.  Refill Singulair.  Pontine hemorrhage (HCC) Chronic microhemorrhage.  She has seen neurology.  We will treat her blood pressure as above.  Discussed bleeding risk with Coumadin and risk of intracranial bleeding with this medication.  Advised that if she has any kind of bleeding that she is unable to get stopped quickly or if she has any sort of head injury even if it is mild she should seek medical attention.  History of pulmonary embolus (PE)  She will continue her Coumadin for now.  We will refer her to hematology to determine if she needs to continue with this.  Check INR.   Orders Placed This Encounter  Procedures  . Flu Vaccine QUAD High Dose(Fluad)  . Comp Met (CMET)  . CBC  . TSH  . Protime-INR  . Ambulatory referral to Hematology    Referral Priority:   Routine    Referral Type:   Consultation    Referral Reason:   Specialty Services Required    Requested Specialty:   Oncology    Number of Visits Requested:   1    Meds ordered this encounter  Medications  . amLODipine (NORVASC) 2.5 MG tablet    Sig: Take 1 tablet (2.5 mg total) by mouth daily.    Dispense:  90 tablet    Refill:   3  . montelukast (SINGULAIR) 10 MG tablet    Sig: Take 1 tablet (10 mg total) by mouth at bedtime.    Dispense:  90 tablet    Refill:  [place], MD [place]

## 2019-03-21 NOTE — Assessment & Plan Note (Signed)
Continue Flonase.  Refill Singulair.

## 2019-03-21 NOTE — Assessment & Plan Note (Signed)
Chronic microhemorrhage.  She has seen neurology.  We will treat her blood pressure as above.  Discussed bleeding risk with Coumadin and risk of intracranial bleeding with this medication.  Advised that if she has any kind of bleeding that she is unable to get stopped quickly or if she has any sort of head injury even if it is mild she should seek medical attention.

## 2019-03-21 NOTE — Assessment & Plan Note (Signed)
Slightly above goal at times.  With a recent MRI revealing a microhemorrhage we will try to treat to goal of 130/80 or less.  We will start her on a low-dose of amlodipine.  She will continue Zebeta.  Follow-up in a month for recheck.

## 2019-03-25 NOTE — Progress Notes (Deleted)
[name]  [phone]) [phone] Fax:(336) [phone]  ID: [name] OB: 12-03-1925  MR#: [id number]  HQP#:[id number]  Patient Care Team: [name], MD as PCP - General (Family Medicine) Bary Castilla, Forest Gleason, MD (General Surgery) Jackolyn Confer, MD (Internal Medicine)  CHIEF COMPLAINT: History of pulmonary embolism.  INTERVAL HISTORY: ***  REVIEW OF SYSTEMS:   ROS  As per HPI. Otherwise, a complete review of systems is negative.  PAST MEDICAL HISTORY: Past Medical History:  Diagnosis Date   Allergy    Breast cancer ([facility]) 07/22/2012   T1c, Nx carcinoma left breast, ER 90%, PR 90%, HER-2/neu not over expressed.   Hypertension    Insomnia    Osteoporosis    Prolia 10/2012   Pulmonary embolus ([place]) 2009   S/P IVC filter 2009    PAST SURGICAL HISTORY: Past Surgical History:  Procedure Laterality Date   BLADDER SURGERY     BREAST SURGERY Left 2013   mastectomy   CHOLECYSTECTOMY     HERNIA REPAIR     MASTECTOMY Left 2013   BREAST CA   REPLACEMENT TOTAL KNEE     right   TONSILLECTOMY      FAMILY HISTORY: Family History  Problem Relation Age of Onset   COPD Mother    Stroke Father    Cancer Father        breast   Breast cancer Maternal Aunt     ADVANCED DIRECTIVES (Y/N):  N  HEALTH MAINTENANCE: Social History   Tobacco Use   Smoking status: Former Smoker    Quit date: 05/13/1999    Years since quitting: 19.8   Smokeless tobacco: Never Used  Substance Use Topics   Alcohol use: No   Drug use: No     Colonoscopy:  PAP:  Bone density:  Lipid panel:  Allergies  Allergen Reactions   Erythromycin    Sulfa Drugs Cross Reactors     rash    Current Outpatient Medications  Medication Sig Dispense Refill   albuterol (PROVENTIL HFA;VENTOLIN HFA) 108 (90 Base) MCG/ACT inhaler Inhale 2 puffs into the lungs every 6 (six) hours as needed for wheezing or shortness of breath. 1 Inhaler 1    ALPRAZolam (XANAX) 0.5 MG tablet Take 0.5 tablets (0.25 mg total) by mouth at bedtime as needed for anxiety or sleep. 30 tablet 0   amLODipine (NORVASC) 2.5 MG tablet Take 1 tablet (2.5 mg total) by mouth daily. 90 tablet 3   baclofen (LIORESAL) 10 MG tablet Take 1 tablet (10 mg total) by mouth 3 (three) times daily as needed for muscle spasms. 30 each 0   Biotin 10 MG TABS Take by mouth.     bisoprolol (ZEBETA) 5 MG tablet Take 1 tablet (5 mg total) by mouth daily. 90 tablet 1   Cholecalciferol (VITAMIN D PO) Take 2,000 mg by mouth daily.     denosumab (PROLIA) 60 MG/ML SOSY injection Prolia 60 mg/mL subcutaneous syringe  Inject 1 mL by subcutaneous route.     diphenoxylate-atropine (LOMOTIL) 2.5-0.025 MG tablet Take 1 tablet by mouth 4 (four) times daily as needed for diarrhea or loose stools. 30 tablet 0   Fluocinolone Acetonide 0.01 % OIL      fluticasone (FLONASE) 50 MCG/ACT nasal spray Place 2 sprays into both nostrils daily. 16 g 6   hydrochlorothiazide (HYDRODIURIL) 12.5 MG tablet      letrozole (FEMARA) 2.5 MG tablet      mirtazapine (REMERON) 7.5 MG tablet Take 1 tablet (7.5  mg total) by mouth at bedtime. 90 tablet 1   montelukast (SINGULAIR) 10 MG tablet Take 1 tablet (10 mg total) by mouth at bedtime. 90 tablet 3   warfarin (COUMADIN) 1 MG tablet Take 1 mg by mouth on Tuesday and Saturday 30 tablet 3   warfarin (COUMADIN) 3 MG tablet Take 1 tablet (3 mg total) by mouth daily. 90 tablet 3   XIIDRA 5 % SOLN      No current facility-administered medications for this visit.     OBJECTIVE: There were no vitals filed for this visit.   There is no height or weight on file to calculate BMI.    ECOG FS:{CHL ONC [id number]  General: Well-developed, well-nourished, no acute distress. Eyes: Pink conjunctiva, anicteric sclera. HEENT: Normocephalic, moist mucous membranes, clear oropharnyx. Lungs: Clear to auscultation bilaterally. Heart: Regular rate and rhythm. No  rubs, murmurs, or gallops. Abdomen: Soft, nontender, nondistended. No organomegaly noted, normoactive bowel sounds. Musculoskeletal: No edema, cyanosis, or clubbing. Neuro: Alert, answering all questions appropriately. Cranial nerves grossly intact. Skin: No rashes or petechiae noted. Psych: Normal affect. Lymphatics: No cervical, calvicular, axillary or inguinal LAD.   LAB RESULTS:  Lab Results  Component Value Date   NA 139 03/20/2019   K 3.8 03/20/2019   CL 106 03/20/2019   CO2 25 03/20/2019   GLUCOSE 94 03/20/2019   BUN 16 03/20/2019   CREATININE 0.96 03/20/2019   CALCIUM 9.4 03/20/2019   PROT 6.8 03/20/2019   ALBUMIN 4.2 03/20/2019   AST 14 03/20/2019   ALT 8 03/20/2019   ALKPHOS 36 (L) 03/20/2019   BILITOT 0.6 03/20/2019   GFRNONAA 54 (L) 10/02/2015   GFRAA 62 10/02/2015    Lab Results  Component Value Date   WBC 6.5 03/20/2019   NEUTROABS 4,178 09/03/2017   HGB 14.2 03/20/2019   HCT 42.7 03/20/2019   MCV 90.9 03/20/2019   PLT 245.0 03/20/2019     STUDIES: Mr Angio Head Wo Contrast  Result Date: 03/07/2019 CLINICAL DATA:  85 year old female with pulsatile tinnitus. Clicking noises in her left ear x1 month. No known injury. EXAM: MRI HEAD WITHOUT AND WITH CONTRAST MRA HEAD WITHOUT CONTRAST MRV HEAD WITHOUT CONTRAST TECHNIQUE: Multiplanar, multiecho pulse sequences of the brain and surrounding structures were obtained without and with intravenous contrast. Angiographic images of the Circle of Willis were obtained using MRA technique without intravenous contrast. Angiographic images of the head were also obtained using MRV technique without contrast. CONTRAST:  5 milliliters Gadavist COMPARISON:  Brain MRI 03/19/2011. FINDINGS: MRI HEAD FINDINGS Brain: No restricted diffusion to suggest acute infarction. No midline shift, mass effect, evidence of mass lesion, ventriculomegaly, extra-axial collection or acute intracranial hemorrhage. Cervicomedullary junction and  pituitary are within normal limits. A chronic microhemorrhage in the right pons (series 24, image 21) was not apparent in 2012. But moderate generalized T2 heterogeneity in the pons has not significantly changed. Similar chronic generalized T2 heterogeneity in the bilateral deep gray nuclei. Increased similar T2 heterogeneity in the deep cerebellar nuclei, which may be perivascular spaces. Patchy and scattered cerebral white matter T2 and FLAIR hyperintensity is chronic but increased since 2012. No cortical encephalomalacia identified. There is also a small chronic microhemorrhage in the right parietal lobe (image 39). No abnormal enhancement identified.  No dural thickening identified. Vascular: Major intracranial vascular flow voids are stable since 2012. The left vertebral artery is dominant. Mild intracranial artery tortuosity. Skull and upper cervical spine: Negative for age visible cervical spine. Visualized bone marrow signal  is within normal limits. Sinuses/Orbits: Stable orbits. A small fluid level in the left maxillary sinus is similar to that in 2012. Trace layering fluid also in both sphenoid sinuses. The remaining paranasal sinuses are well pneumatized. Other: Dedicated internal auditory canal imaging. Normal cerebellopontine angles. Normal bilateral cisternal and intracanalicular 7th and 8th cranial nerve segments. No abnormal enhancement identified. Bilateral mastoids are clear. Normal stylomastoid foramina. No skull base abnormality identified. Parotid glands are within normal limits. Visible cavernous sinus appears normal. MRA HEAD FINDINGS Antegrade flow in the posterior circulation with dominant left vertebral artery. Patent PICA origins. Both vertebral arteries are patent to the vertebrobasilar junction without significant stenosis. Patent basilar artery without stenosis. Normal SCA and right PCA origins. Fetal type left PCA origin. Right posterior communicating artery is diminutive or absent.  Bilateral PCA branches are patent with mild irregularity and stenosis greater on the left. No abnormal flow signal about the temporal bones. Antegrade flow in both ICA siphons. Mild siphon irregularity but no stenosis. Normal ophthalmic and left posterior communicating artery origins. Patent carotid termini. Normal MCA and ACA origins. Diminutive or absent anterior communicating artery. The left ACA is mildly dominant. Visible ACA branches are within normal limits. Bilateral MCA M1 segments and MCA bifurcations are within normal limits. Visible bilateral MCA branches are within normal limits. MRV HEAD FINDINGS Coronal time-of-flight imaging. Preserved flow signal in the superior sagittal sinus, the torcula, straight sinus, vein of Galen, internal cerebral veins, and basal veins of Rosenthal. Preserved flow signal in both transverse and sigmoid sinuses, the right are mildly dominant. Preserved flow signal in both internal jugular bulbs. Preserved flow signal also in the major draining veins including the bilateral vein of Labbe and vein of Trolard. No abnormal flow signal about the temporal bones. IMPRESSION: 1.  No acute intracranial abnormality and negative IAC imaging. 2. Negative intracranial MRV. Negative intracranial MRA aside from mild for age atherosclerosis. 3. Chronic small vessel disease most pronounced in the deep gray nuclei and pons. A prominent chronic micro-hemorrhage in the right pons is new since 2012. 4. Several small paranasal sinus fluid levels, similar to that on the 2012 MRI. Electronically Signed   By: Genevie Ann M.D.   On: 03/07/2019 00:55   Mr Jeri Cos KV Contrast  Result Date: 03/07/2019 CLINICAL DATA:  85 year old female with pulsatile tinnitus. Clicking noises in her left ear x1 month. No known injury. EXAM: MRI HEAD WITHOUT AND WITH CONTRAST MRA HEAD WITHOUT CONTRAST MRV HEAD WITHOUT CONTRAST TECHNIQUE: Multiplanar, multiecho pulse sequences of the brain and surrounding structures were  obtained without and with intravenous contrast. Angiographic images of the Circle of Willis were obtained using MRA technique without intravenous contrast. Angiographic images of the head were also obtained using MRV technique without contrast. CONTRAST:  5 milliliters Gadavist COMPARISON:  Brain MRI 03/19/2011. FINDINGS: MRI HEAD FINDINGS Brain: No restricted diffusion to suggest acute infarction. No midline shift, mass effect, evidence of mass lesion, ventriculomegaly, extra-axial collection or acute intracranial hemorrhage. Cervicomedullary junction and pituitary are within normal limits. A chronic microhemorrhage in the right pons (series 24, image 21) was not apparent in 2012. But moderate generalized T2 heterogeneity in the pons has not significantly changed. Similar chronic generalized T2 heterogeneity in the bilateral deep gray nuclei. Increased similar T2 heterogeneity in the deep cerebellar nuclei, which may be perivascular spaces. Patchy and scattered cerebral white matter T2 and FLAIR hyperintensity is chronic but increased since 2012. No cortical encephalomalacia identified. There is also a small chronic microhemorrhage  in the right parietal lobe (image 39). No abnormal enhancement identified.  No dural thickening identified. Vascular: Major intracranial vascular flow voids are stable since 2012. The left vertebral artery is dominant. Mild intracranial artery tortuosity. Skull and upper cervical spine: Negative for age visible cervical spine. Visualized bone marrow signal is within normal limits. Sinuses/Orbits: Stable orbits. A small fluid level in the left maxillary sinus is similar to that in 2012. Trace layering fluid also in both sphenoid sinuses. The remaining paranasal sinuses are well pneumatized. Other: Dedicated internal auditory canal imaging. Normal cerebellopontine angles. Normal bilateral cisternal and intracanalicular 7th and 8th cranial nerve segments. No abnormal enhancement identified.  Bilateral mastoids are clear. Normal stylomastoid foramina. No skull base abnormality identified. Parotid glands are within normal limits. Visible cavernous sinus appears normal. MRA HEAD FINDINGS Antegrade flow in the posterior circulation with dominant left vertebral artery. Patent PICA origins. Both vertebral arteries are patent to the vertebrobasilar junction without significant stenosis. Patent basilar artery without stenosis. Normal SCA and right PCA origins. Fetal type left PCA origin. Right posterior communicating artery is diminutive or absent. Bilateral PCA branches are patent with mild irregularity and stenosis greater on the left. No abnormal flow signal about the temporal bones. Antegrade flow in both ICA siphons. Mild siphon irregularity but no stenosis. Normal ophthalmic and left posterior communicating artery origins. Patent carotid termini. Normal MCA and ACA origins. Diminutive or absent anterior communicating artery. The left ACA is mildly dominant. Visible ACA branches are within normal limits. Bilateral MCA M1 segments and MCA bifurcations are within normal limits. Visible bilateral MCA branches are within normal limits. MRV HEAD FINDINGS Coronal time-of-flight imaging. Preserved flow signal in the superior sagittal sinus, the torcula, straight sinus, vein of Galen, internal cerebral veins, and basal veins of Rosenthal. Preserved flow signal in both transverse and sigmoid sinuses, the right are mildly dominant. Preserved flow signal in both internal jugular bulbs. Preserved flow signal also in the major draining veins including the bilateral vein of Labbe and vein of Trolard. No abnormal flow signal about the temporal bones. IMPRESSION: 1.  No acute intracranial abnormality and negative IAC imaging. 2. Negative intracranial MRV. Negative intracranial MRA aside from mild for age atherosclerosis. 3. Chronic small vessel disease most pronounced in the deep gray nuclei and pons. A prominent chronic  micro-hemorrhage in the right pons is new since 2012. 4. Several small paranasal sinus fluid levels, similar to that on the 2012 MRI. Electronically Signed   By: Genevie Ann M.D.   On: 03/07/2019 00:55   Mr Mrv Head Wo Cm  Result Date: 03/07/2019 CLINICAL DATA:  85 year old female with pulsatile tinnitus. Clicking noises in her left ear x1 month. No known injury. EXAM: MRI HEAD WITHOUT AND WITH CONTRAST MRA HEAD WITHOUT CONTRAST MRV HEAD WITHOUT CONTRAST TECHNIQUE: Multiplanar, multiecho pulse sequences of the brain and surrounding structures were obtained without and with intravenous contrast. Angiographic images of the Circle of Willis were obtained using MRA technique without intravenous contrast. Angiographic images of the head were also obtained using MRV technique without contrast. CONTRAST:  5 milliliters Gadavist COMPARISON:  Brain MRI 03/19/2011. FINDINGS: MRI HEAD FINDINGS Brain: No restricted diffusion to suggest acute infarction. No midline shift, mass effect, evidence of mass lesion, ventriculomegaly, extra-axial collection or acute intracranial hemorrhage. Cervicomedullary junction and pituitary are within normal limits. A chronic microhemorrhage in the right pons (series 24, image 21) was not apparent in 2012. But moderate generalized T2 heterogeneity in the pons has not significantly changed. Similar  chronic generalized T2 heterogeneity in the bilateral deep gray nuclei. Increased similar T2 heterogeneity in the deep cerebellar nuclei, which may be perivascular spaces. Patchy and scattered cerebral white matter T2 and FLAIR hyperintensity is chronic but increased since 2012. No cortical encephalomalacia identified. There is also a small chronic microhemorrhage in the right parietal lobe (image 39). No abnormal enhancement identified.  No dural thickening identified. Vascular: Major intracranial vascular flow voids are stable since 2012. The left vertebral artery is dominant. Mild intracranial artery  tortuosity. Skull and upper cervical spine: Negative for age visible cervical spine. Visualized bone marrow signal is within normal limits. Sinuses/Orbits: Stable orbits. A small fluid level in the left maxillary sinus is similar to that in 2012. Trace layering fluid also in both sphenoid sinuses. The remaining paranasal sinuses are well pneumatized. Other: Dedicated internal auditory canal imaging. Normal cerebellopontine angles. Normal bilateral cisternal and intracanalicular 7th and 8th cranial nerve segments. No abnormal enhancement identified. Bilateral mastoids are clear. Normal stylomastoid foramina. No skull base abnormality identified. Parotid glands are within normal limits. Visible cavernous sinus appears normal. MRA HEAD FINDINGS Antegrade flow in the posterior circulation with dominant left vertebral artery. Patent PICA origins. Both vertebral arteries are patent to the vertebrobasilar junction without significant stenosis. Patent basilar artery without stenosis. Normal SCA and right PCA origins. Fetal type left PCA origin. Right posterior communicating artery is diminutive or absent. Bilateral PCA branches are patent with mild irregularity and stenosis greater on the left. No abnormal flow signal about the temporal bones. Antegrade flow in both ICA siphons. Mild siphon irregularity but no stenosis. Normal ophthalmic and left posterior communicating artery origins. Patent carotid termini. Normal MCA and ACA origins. Diminutive or absent anterior communicating artery. The left ACA is mildly dominant. Visible ACA branches are within normal limits. Bilateral MCA M1 segments and MCA bifurcations are within normal limits. Visible bilateral MCA branches are within normal limits. MRV HEAD FINDINGS Coronal time-of-flight imaging. Preserved flow signal in the superior sagittal sinus, the torcula, straight sinus, vein of Galen, internal cerebral veins, and basal veins of Rosenthal. Preserved flow signal in both  transverse and sigmoid sinuses, the right are mildly dominant. Preserved flow signal in both internal jugular bulbs. Preserved flow signal also in the major draining veins including the bilateral vein of Labbe and vein of Trolard. No abnormal flow signal about the temporal bones. IMPRESSION: 1.  No acute intracranial abnormality and negative IAC imaging. 2. Negative intracranial MRV. Negative intracranial MRA aside from mild for age atherosclerosis. 3. Chronic small vessel disease most pronounced in the deep gray nuclei and pons. A prominent chronic micro-hemorrhage in the right pons is new since 2012. 4. Several small paranasal sinus fluid levels, similar to that on the 2012 MRI. Electronically Signed   By: Genevie Ann M.D.   On: 03/07/2019 00:55    ASSESSMENT: History of pulmonary embolism  PLAN:    1. History of pulmonary embolism:  Patient expressed understanding and was in agreement with this plan. She also understands that She can call clinic at any time with any questions, concerns, or complaints.   Cancer Staging No matching staging information was found for the patient.  Lloyd Huger, MD   03/25/2019 8:05 AM

## 2019-03-29 ENCOUNTER — Telehealth: Payer: Self-pay | Admitting: *Deleted

## 2019-03-29 ENCOUNTER — Ambulatory Visit: Payer: Self-pay | Admitting: *Deleted

## 2019-03-29 DIAGNOSIS — Z20828 Contact with and (suspected) exposure to other viral communicable diseases: Secondary | ICD-10-CM | POA: Diagnosis not present

## 2019-03-29 NOTE — Telephone Encounter (Signed)
Copied from [place] [id number]. Topic: General - Other >> Mar 29, 2019  4:51 PM Pauline Good wrote: Reason for CRM: pt's appt with Dr Grayland Ormond is r/s until next Friday.

## 2019-03-29 NOTE — Telephone Encounter (Signed)
Noted  

## 2019-03-29 NOTE — Telephone Encounter (Signed)
Pt called stating that she started taking amlodipine last Saturday and now she is starting to have some dizziness that goes away but comes back at times. It comes and goes. She thinks it is from the amlodipine. It happens when she tries to get up from a sitting position and sometimes when she is walking, she may feel it. So she is taking her time getting up. She denies any other symptoms. Her b/p today is 130/76, HR 71 and Temp 97.1.  She is also in quarantine now, someone at the [facility] tested positive for the virus and she had to be tested. Routing to LB at [facility] for review and recommendation.  Reason for Disposition . [1] Caller has NON-URGENT medication question about med that PCP prescribed AND [2] triager unable to answer question  Answer Assessment - Initial Assessment Questions 1.   NAME of MEDICATION: "What medicine are you calling about?"     amlodipine 2.   QUESTION: "What is your question?"     Is this medication causing feeling woozing 3.   PRESCRIBING HCP: "Who prescribed it?" Reason: if prescribed by specialist, call should be referred to that group.     Dr. [name] 4. SYMPTOMS: "Do you have any symptoms?"     Light headed at times 5. SEVERITY: If symptoms are present, ask "Are they mild, moderate or severe?"     mild 6.  PREGNANCY:  "Is there any chance that you are pregnant?" "When was your last menstrual period?"     n/a  Protocols used: MEDICATION QUESTION CALL-A-AH

## 2019-03-30 NOTE — Telephone Encounter (Signed)
Called patient.  Line busy.  Unable to leave a message.

## 2019-03-31 ENCOUNTER — Inpatient Hospital Stay
Admission: EM | Admit: 2019-03-31 | Discharge: 2019-04-02 | DRG: 309 | Disposition: A | Discharge status: Home Health | Payer: Medicare Other | Attending: Internal Medicine | Admitting: Internal Medicine

## 2019-03-31 ENCOUNTER — Other Ambulatory Visit: Payer: Self-pay

## 2019-03-31 ENCOUNTER — Emergency Department: Payer: Medicare Other

## 2019-03-31 ENCOUNTER — Inpatient Hospital Stay: Payer: Medicare Other | Admitting: Oncology

## 2019-03-31 DIAGNOSIS — Z86711 Personal history of pulmonary embolism: Secondary | ICD-10-CM

## 2019-03-31 DIAGNOSIS — Z79899 Other long term (current) drug therapy: Secondary | ICD-10-CM | POA: Diagnosis not present

## 2019-03-31 DIAGNOSIS — R413 Other amnesia: Secondary | ICD-10-CM | POA: Diagnosis present

## 2019-03-31 DIAGNOSIS — I471 Supraventricular tachycardia: Secondary | ICD-10-CM | POA: Diagnosis not present

## 2019-03-31 DIAGNOSIS — Z20828 Contact with and (suspected) exposure to other viral communicable diseases: Secondary | ICD-10-CM | POA: Diagnosis present

## 2019-03-31 DIAGNOSIS — I4719 Other supraventricular tachycardia: Secondary | ICD-10-CM

## 2019-03-31 DIAGNOSIS — I679 Cerebrovascular disease, unspecified: Secondary | ICD-10-CM | POA: Diagnosis present

## 2019-03-31 DIAGNOSIS — G4700 Insomnia, unspecified: Secondary | ICD-10-CM | POA: Diagnosis present

## 2019-03-31 DIAGNOSIS — F05 Delirium due to known physiological condition: Secondary | ICD-10-CM | POA: Diagnosis not present

## 2019-03-31 DIAGNOSIS — Z95828 Presence of other vascular implants and grafts: Secondary | ICD-10-CM | POA: Diagnosis not present

## 2019-03-31 DIAGNOSIS — R8290 Unspecified abnormal findings in urine: Secondary | ICD-10-CM | POA: Diagnosis present

## 2019-03-31 DIAGNOSIS — M810 Age-related osteoporosis without current pathological fracture: Secondary | ICD-10-CM | POA: Diagnosis present

## 2019-03-31 DIAGNOSIS — Z888 Allergy status to other drugs, medicaments and biological substances status: Secondary | ICD-10-CM | POA: Diagnosis not present

## 2019-03-31 DIAGNOSIS — R8271 Bacteriuria: Secondary | ICD-10-CM | POA: Diagnosis present

## 2019-03-31 DIAGNOSIS — Z91048 Other nonmedicinal substance allergy status: Secondary | ICD-10-CM

## 2019-03-31 DIAGNOSIS — F419 Anxiety disorder, unspecified: Secondary | ICD-10-CM | POA: Diagnosis present

## 2019-03-31 DIAGNOSIS — I498 Other specified cardiac arrhythmias: Secondary | ICD-10-CM | POA: Diagnosis not present

## 2019-03-31 DIAGNOSIS — I340 Nonrheumatic mitral (valve) insufficiency: Secondary | ICD-10-CM | POA: Diagnosis not present

## 2019-03-31 DIAGNOSIS — Z882 Allergy status to sulfonamides status: Secondary | ICD-10-CM | POA: Diagnosis not present

## 2019-03-31 DIAGNOSIS — Z853 Personal history of malignant neoplasm of breast: Secondary | ICD-10-CM

## 2019-03-31 DIAGNOSIS — I499 Cardiac arrhythmia, unspecified: Secondary | ICD-10-CM | POA: Diagnosis present

## 2019-03-31 DIAGNOSIS — I361 Nonrheumatic tricuspid (valve) insufficiency: Secondary | ICD-10-CM | POA: Diagnosis not present

## 2019-03-31 DIAGNOSIS — F329 Major depressive disorder, single episode, unspecified: Secondary | ICD-10-CM | POA: Diagnosis present

## 2019-03-31 DIAGNOSIS — Z96651 Presence of right artificial knee joint: Secondary | ICD-10-CM | POA: Diagnosis present

## 2019-03-31 DIAGNOSIS — Z87891 Personal history of nicotine dependence: Secondary | ICD-10-CM | POA: Diagnosis not present

## 2019-03-31 DIAGNOSIS — I10 Essential (primary) hypertension: Secondary | ICD-10-CM | POA: Diagnosis present

## 2019-03-31 DIAGNOSIS — R55 Syncope and collapse: Secondary | ICD-10-CM

## 2019-03-31 DIAGNOSIS — I2699 Other pulmonary embolism without acute cor pulmonale: Secondary | ICD-10-CM | POA: Diagnosis not present

## 2019-03-31 DIAGNOSIS — R42 Dizziness and giddiness: Secondary | ICD-10-CM

## 2019-03-31 DIAGNOSIS — Z803 Family history of malignant neoplasm of breast: Secondary | ICD-10-CM

## 2019-03-31 DIAGNOSIS — F0391 Unspecified dementia with behavioral disturbance: Secondary | ICD-10-CM | POA: Diagnosis not present

## 2019-03-31 DIAGNOSIS — Z7901 Long term (current) use of anticoagulants: Secondary | ICD-10-CM

## 2019-03-31 DIAGNOSIS — Z9012 Acquired absence of left breast and nipple: Secondary | ICD-10-CM

## 2019-03-31 LAB — CBC
HCT: 41.8 % (ref 36.0–46.0)
Hemoglobin: 13.7 g/dL (ref 12.0–15.0)
MCH: 29.3 pg (ref 26.0–34.0)
MCHC: 32.8 g/dL (ref 30.0–36.0)
MCV: 89.5 fL (ref 80.0–100.0)
Platelets: 235 10*3/uL (ref 150–400)
RBC: 4.67 MIL/uL (ref 3.87–5.11)
RDW: 13.5 % (ref 11.5–15.5)
WBC: 6.7 10*3/uL (ref 4.0–10.5)
nRBC: 0 % (ref 0.0–0.2)

## 2019-03-31 LAB — URINALYSIS, COMPLETE (UACMP) WITH MICROSCOPIC
Bilirubin Urine: NEGATIVE
Glucose, UA: NEGATIVE mg/dL
Hgb urine dipstick: NEGATIVE
Ketones, ur: NEGATIVE mg/dL
Nitrite: NEGATIVE
Protein, ur: NEGATIVE mg/dL
Specific Gravity, Urine: 1.011 (ref 1.005–1.030)
pH: 6 (ref 5.0–8.0)

## 2019-03-31 LAB — BASIC METABOLIC PANEL
Anion gap: 10 (ref 5–15)
BUN: 15 mg/dL (ref 8–23)
CO2: 23 mmol/L (ref 22–32)
Calcium: 9 mg/dL (ref 8.9–10.3)
Chloride: 108 mmol/L (ref 98–111)
Creatinine, Ser: 1 mg/dL (ref 0.44–1.00)
GFR calc Af Amer: 57 mL/min — ABNORMAL LOW (ref >60)
GFR calc non Af Amer: 49 mL/min — ABNORMAL LOW (ref >60)
Glucose, Bld: 102 mg/dL — ABNORMAL HIGH (ref 70–99)
Potassium: 3.9 mmol/L (ref 3.5–5.1)
Sodium: 141 mmol/L (ref 135–145)

## 2019-03-31 LAB — MRSA PCR SCREENING: MRSA by PCR: NEGATIVE

## 2019-03-31 LAB — PROTIME-INR
INR: 1.2 (ref 0.8–1.2)
Prothrombin Time: 15.1 seconds (ref 11.4–15.2)

## 2019-03-31 LAB — SARS CORONAVIRUS 2 BY RT PCR (HOSPITAL ORDER, PERFORMED IN ~~LOC~~ HOSPITAL LAB): SARS Coronavirus 2: NEGATIVE

## 2019-03-31 LAB — TROPONIN I (HIGH SENSITIVITY)
Troponin I (High Sensitivity): 6 ng/L (ref <18)
Troponin I (High Sensitivity): 6 ng/L (ref <18)

## 2019-03-31 LAB — TSH: TSH: 2.635 u[IU]/mL (ref 0.350–4.500)

## 2019-03-31 MED ORDER — MONTELUKAST SODIUM 10 MG PO TABS
10.0000 mg | ORAL_TABLET | Freq: Every day | ORAL | Status: DC
  Administered 2019-03-31 – 2019-04-01 (×2): 10 mg via ORAL
  Filled 2019-03-31 (×2): qty 1

## 2019-03-31 MED ORDER — FLUTICASONE PROPIONATE 50 MCG/ACT NA SUSP
2.0000 | Freq: Every day | NASAL | Status: DC
  Administered 2019-04-01 – 2019-04-02 (×2): 2 via NASAL
  Filled 2019-03-31: qty 16

## 2019-03-31 MED ORDER — WARFARIN SODIUM 4 MG PO TABS: 4.0000 mg | ORAL_TABLET | Freq: Once | ORAL | Status: DC

## 2019-03-31 MED ORDER — HYDROCHLOROTHIAZIDE 25 MG PO TABS
12.5000 mg | ORAL_TABLET | Freq: Every day | ORAL | Status: DC
  Administered 2019-04-01: 12.5 mg via ORAL
  Filled 2019-03-31: qty 1

## 2019-03-31 MED ORDER — METOPROLOL TARTRATE 25 MG PO TABS
25.0000 mg | ORAL_TABLET | Freq: Two times a day (BID) | ORAL | Status: DC
  Administered 2019-03-31 – 2019-04-01 (×3): 25 mg via ORAL
  Filled 2019-03-31 (×3): qty 1

## 2019-03-31 MED ORDER — WARFARIN SODIUM 1 MG PO TABS: 1.0000 mg | ORAL_TABLET | Freq: Every day | ORAL | Status: DC

## 2019-03-31 MED ORDER — ALBUTEROL SULFATE (2.5 MG/3ML) 0.083% IN NEBU: 2.5000 mg | INHALATION_SOLUTION | Freq: Four times a day (QID) | RESPIRATORY_TRACT | Status: DC | PRN

## 2019-03-31 MED ORDER — DIPHENOXYLATE-ATROPINE 2.5-0.025 MG PO TABS
1.0000 | ORAL_TABLET | Freq: Four times a day (QID) | ORAL | Status: DC | PRN
  Administered 2019-04-01 (×2): 1 via ORAL
  Filled 2019-03-31 (×2): qty 1

## 2019-03-31 MED ORDER — LETROZOLE 2.5 MG PO TABS
2.5000 mg | ORAL_TABLET | Freq: Every day | ORAL | Status: DC
  Filled 2019-03-31: qty 1

## 2019-03-31 MED ORDER — DILTIAZEM HCL 25 MG/5ML IV SOLN
10.0000 mg | Freq: Once | INTRAVENOUS | Status: AC
  Administered 2019-03-31: 10 mg via INTRAVENOUS
  Filled 2019-03-31: qty 5

## 2019-03-31 MED ORDER — LIFITEGRAST 5 % OP SOLN: 1.0000 [drp] | Freq: Every day | OPHTHALMIC | Status: DC

## 2019-03-31 MED ORDER — WARFARIN SODIUM 4 MG PO TABS
4.0000 mg | ORAL_TABLET | Freq: Once | ORAL | Status: AC
  Administered 2019-03-31: 4 mg via ORAL
  Filled 2019-03-31: qty 1

## 2019-03-31 MED ORDER — WARFARIN - PHARMACIST DOSING INPATIENT
Freq: Every day | Status: DC
  Administered 2019-04-01: 17:00:00

## 2019-03-31 MED ORDER — SODIUM CHLORIDE 0.9 % IV SOLN
INTRAVENOUS | Status: DC
  Administered 2019-03-31 – 2019-04-01 (×2): via INTRAVENOUS

## 2019-03-31 MED ORDER — METOPROLOL TARTRATE 5 MG/5ML IV SOLN
5.0000 mg | Freq: Once | INTRAVENOUS | Status: AC
  Administered 2019-03-31: 5 mg via INTRAVENOUS
  Filled 2019-03-31: qty 5

## 2019-03-31 MED ORDER — BIOTIN 10 MG PO TABS: 10.0000 mg | ORAL_TABLET | Freq: Every day | ORAL | Status: DC

## 2019-03-31 MED ORDER — WARFARIN SODIUM 3 MG PO TABS: 3.0000 mg | ORAL_TABLET | Freq: Every day | ORAL | Status: DC

## 2019-03-31 MED ORDER — ALPRAZOLAM 0.25 MG PO TABS
0.2500 mg | ORAL_TABLET | Freq: Every evening | ORAL | Status: DC | PRN
  Administered 2019-04-01: 0.25 mg via ORAL
  Filled 2019-03-31: qty 1

## 2019-03-31 NOTE — Telephone Encounter (Signed)
Patient is currently in the ED.

## 2019-03-31 NOTE — Progress Notes (Signed)
[place]  [phone] Fax:(336) [phone]  ID: [name] OB: 1926/06/01  MR#: [id number]  LKG#:[id number]  Patient Care Team: [name], MD as PCP - General (Family Medicine) Bary Castilla, Forest Gleason, MD (General Surgery) Jackolyn Confer, MD (Internal Medicine)  CHIEF COMPLAINT: History of pulmonary embolus  INTERVAL HISTORY: Patient is a 85 year old female with a history of low stage breast cancer who also had a massive PE in 2009.  Patient was placed on Coumadin and also had an IVC filter placed.  This procedure was apparently done in [place].  The IVC filter has never been removed and patient remains on Coumadin.  She was referred to clinic today for evaluation of need for additional anticoagulation.  She currently feels well and is asymptomatic.  She has no neurological planes.  She denies any recent fevers or illnesses.  She has a good appetite and denies weight loss.  She has no chest pain, shortness of breath, cough, or hemoptysis.  She has no nausea, vomiting, constipation, or diarrhea.  She has no urinary complaints.  Patient feels that her baseline offers no specific complaints today.  REVIEW OF SYSTEMS:   Review of Systems  Constitutional: Negative.  Negative for fever, malaise/fatigue and weight loss.  Respiratory: Negative for cough and shortness of breath.   Cardiovascular: Negative.  Negative for chest pain and leg swelling.  Gastrointestinal: Negative.  Negative for abdominal pain.  Genitourinary: Negative.  Negative for hematuria.  Musculoskeletal: Negative.  Negative for back pain.  Skin: Negative.  Negative for rash.  Neurological: Negative.  Negative for dizziness, focal weakness, weakness and headaches.  Endo/Heme/Allergies: Does not bruise/bleed easily.  Psychiatric/Behavioral: Negative.  The patient is not nervous/anxious.     As per HPI. Otherwise, a complete review of systems is negative.  PAST MEDICAL HISTORY:  Past Medical History:  Diagnosis Date  . Allergy   . Breast cancer ([place]) 07/22/2012   T1c, Nx carcinoma left breast, ER 90%, PR 90%, HER-2/neu not over expressed.  . Hypertension   . Insomnia   . Osteoporosis    Prolia 10/2012  . Pulmonary embolus ([place]) 2009  . S/P IVC filter 2009    PAST SURGICAL HISTORY: Past Surgical History:  Procedure Laterality Date  . BLADDER SURGERY    . BREAST SURGERY Left 2013   mastectomy  . CHOLECYSTECTOMY    . HERNIA REPAIR    . MASTECTOMY Left 2013   BREAST CA  . REPLACEMENT TOTAL KNEE     right  . TONSILLECTOMY      FAMILY HISTORY: Family History  Problem Relation Age of Onset  . COPD Mother   . Stroke Father   . Breast cancer Maternal Aunt     ADVANCED DIRECTIVES (Y/N):  N  HEALTH MAINTENANCE: Social History   Tobacco Use  . Smoking status: Former Smoker    Types: Cigarettes    Quit date: 05/13/1999    Years since quitting: 19.9  . Smokeless tobacco: Never Used  Substance Use Topics  . Alcohol use: No  . Drug use: No     Colonoscopy:  PAP:  Bone density:  Lipid panel:  Allergies  Allergen Reactions  . Erythromycin   . Sulfa Drugs Cross Reactors     rash    Current Outpatient Medications  Medication Sig Dispense Refill  . albuterol (PROVENTIL HFA;VENTOLIN HFA) 108 (90 Base) MCG/ACT inhaler Inhale 2 puffs into the lungs every 6 (six) hours as needed for wheezing or shortness of breath.  1 Inhaler 1  . Biotin 10 MG TABS Take 10 mg by mouth daily.     . Cholecalciferol (VITAMIN D PO) Take 2,000 mg by mouth daily.    Marland Kitchen denosumab (PROLIA) 60 MG/ML SOSY injection Prolia 60 mg/mL subcutaneous syringe  Inject 1 mL by subcutaneous route.    . diphenoxylate-atropine (LOMOTIL) 2.5-0.025 MG tablet Take 1 tablet by mouth 4 (four) times daily as needed for diarrhea or loose stools. 30 tablet 0  . fluticasone (FLONASE) 50 MCG/ACT nasal spray Place 2 sprays into both nostrils daily. 16 g 6  . metoprolol tartrate (LOPRESSOR) 50 MG  tablet Take 1 tablet (50 mg total) by mouth 2 (two) times daily. 180 tablet 2  . montelukast (SINGULAIR) 10 MG tablet Take 1 tablet (10 mg total) by mouth at bedtime. 90 tablet 3  . warfarin (COUMADIN) 1 MG tablet Take 1 mg by mouth on Tuesday and Saturday 30 tablet 3  . warfarin (COUMADIN) 3 MG tablet Take 1 tablet (3 mg total) by mouth daily. (Patient taking differently: Take 3 mg by mouth daily. Monday, Wednesday, Friday) 90 tablet 3  . XIIDRA 5 % SOLN Place 1-2 drops into both eyes daily.      No current facility-administered medications for this visit.     OBJECTIVE: Vitals:   04/14/19 1040  BP: (!) 151/83  Pulse: 66  Temp: 98.1 F (36.7 C)     Body mass index is 25.78 kg/m.    ECOG FS:0 - Asymptomatic  General: Well-developed, well-nourished, no acute distress.  Sitting in a wheelchair. Eyes: Pink conjunctiva, anicteric sclera. HEENT: Normocephalic, moist mucous membranes, clear oropharnyx. Lungs: Clear to auscultation bilaterally. Heart: Regular rate and rhythm. No rubs, murmurs, or gallops. Abdomen: Soft, nontender, nondistended. No organomegaly noted, normoactive bowel sounds. Musculoskeletal: No edema, cyanosis, or clubbing. Neuro: Alert, answering all questions appropriately. Cranial nerves grossly intact. Skin: No rashes or petechiae noted. Psych: Normal affect. Lymphatics: No cervical, calvicular, axillary or inguinal LAD.   LAB RESULTS:  Lab Results  Component Value Date   NA 143 04/02/2019   K 3.7 04/02/2019   CL 112 (H) 04/02/2019   CO2 22 04/02/2019   GLUCOSE 93 04/02/2019   BUN 12 04/02/2019   CREATININE 0.90 04/02/2019   CALCIUM 8.6 (L) 04/02/2019   PROT 6.8 03/20/2019   ALBUMIN 4.2 03/20/2019   AST 14 03/20/2019   ALT 8 03/20/2019   ALKPHOS 36 (L) 03/20/2019   BILITOT 0.6 03/20/2019   GFRNONAA 56 (L) 04/02/2019   GFRAA >60 04/02/2019    Lab Results  Component Value Date   WBC 6.1 04/02/2019   NEUTROABS 4,178 09/03/2017   HGB 13.8  04/02/2019   HCT 42.4 04/02/2019   MCV 90.0 04/02/2019   PLT 219 04/02/2019     STUDIES: Ct Head Wo Contrast  Result Date: 03/31/2019 CLINICAL DATA:  Dizziness and near syncopal episodes after starting blood pressure medication recently. EXAM: CT HEAD WITHOUT CONTRAST TECHNIQUE: Contiguous axial images were obtained from the base of the skull through the vertex without intravenous contrast. COMPARISON:  03/19/2011. Brain MR dated 03/06/2019 and 03/19/2011. FINDINGS: Brain: Mild-to-moderate enlargement of the ventricles and subarachnoid spaces. Moderate patchy white matter low density in both cerebral hemispheres. No intracranial hemorrhage, mass lesion or CT evidence of acute infarction. Vascular: No hyperdense vessel or unexpected calcification. Skull: Mild bilateral hyperostosis frontalis. Sinuses/Orbits: Status post bilateral cataract extraction. Left maxillary sinus air-fluid level. Right sphenoid sinus air-fluid level without significant change since 03/06/2019 and on 03/19/2011.  Other: None. IMPRESSION: 1. No acute abnormality. 2. Mild to moderate diffuse cerebral and cerebellar atrophy. 3. Moderate chronic small vessel white matter ischemic changes in both cerebral hemispheres. 4. Stable left maxillary and right sphenoid sinus chronic effusions. Electronically Signed   By: Claudie Revering M.D.   On: 03/31/2019 16:41    ASSESSMENT: History of pulmonary embolus  PLAN:    1. History of pulmonary embolus: Per patient's report she had a massive pulmonary embolus in 2009 in [place] that "scared the doctor".  She had an IVC filter placed at that time which has never been removed.  She also was placed on Coumadin.  She has no personal or family history of DVT and is unclear of the situation surrounding her previous pulmonary embolism.  Although patient does not require additional anticoagulation for her blood clot, the retained IVC filter is thrombogenic and places her at risk for blood clots.   Have sent a referral to vascular surgery to see if removal of IVC filter is possible.  If the IVC filter can be removed, patient does not require any further anticoagulation.  If the IVC filter cannot be removed and stays in place, she will require lifelong anticoagulation.  I think it would be reasonable to discontinue Coumadin if this were the case and place patient on low-dose Eliquis for prophylaxis at 2.5 mg twice daily if there are no obvious contraindications.  No follow-up has been scheduled.  Please refer patient back if there are any questions or concerns. 2.  History of breast cancer: Patient completed 5 years of hormonal treatment.  Continue yearly mammograms.  I spent a total of 60 minutes face-to-face with the patient of which greater than 50% of the visit was spent in counseling and coordination of care as detailed above.   Patient expressed understanding and was in agreement with this plan. She also understands that She can call clinic at any time with any questions, concerns, or complaints.    Lloyd Huger, MD   04/14/2019 2:47 PM

## 2019-03-31 NOTE — Progress Notes (Addendum)
Pt was admitted on the floor with no signs of distress. Pt alert and oriented x 4. VSS. Pt HR was still at A-fib running at 88-138 non sustain. Notify Prime and talked to Dr. Jannifer Franklin and states will place order. Will continue to monitor.  Update 2122: Pt was not COVID test down in the ED. Notify prime. Dr. Jannifer Franklin States will place order. Will continue to monitor.  Update c2157: Pt sustaining at 88-100. Will continue to monitor.  Update 2330: Pt HR running from 66-130. Cardizem 10 mg once order by Dr. Jannifer Franklin states that if HR /110 sustaining to give him a call. Will continue to monitor.  Update 0700: Pt HR going from 88-148 non sustain. Page prime and talked to Dr. Marcille Blanco states will place order and states to give metropolol 0800 instead of 1000. Will notify incoming shift. Will continue to monitor.

## 2019-03-31 NOTE — ED Notes (Addendum)
PAtient having episode of A-fib at this time. HR jumping from 120-140s. PA Rufina Falco made aware and ordering med at this time. See [facility]

## 2019-03-31 NOTE — ED Notes (Signed)
ED TO INPATIENT HANDOFF REPORT  ED Nurse Name and Phone #: gracie  S Name/Age/Gender [name] 85 y.o. female Room/Bed: ED25A/ED25A  Code Status   Code Status: Full Code  Home/SNF/Other Nursing Home Patient oriented to: self, place, time and situation Is this baseline? Yes   Triage Complete: Triage complete  Chief Complaint dizziness  Triage Note Reports she recently started BP medications and has been having dizziness and near syncope with standing. Pt has never been on BP medications before, referred by PCP. No syncope. Pt alert and oriented X4, cooperative, RR even and unlabored, color WNL. Pt in NAD.    Allergies Allergies  Allergen Reactions  . Erythromycin   . Sulfa Drugs Cross Reactors     rash    Level of Care/Admitting Diagnosis ED Disposition    ED Disposition Condition [place] [facility]: [place] [100120]  Level of Care: Telemetry [5]  Covid Evaluation: Asymptomatic Screening Protocol (No Symptoms)  Diagnosis: Postural dizziness with near syncope [[id number]]  Admitting Physician: Lang Snow [[id number]]  Attending Physician: Rufina Falco ACHIENG [[id number]]  Estimated length of stay: past midnight tomorrow  Certification:: I certify this patient will need inpatient services for at least 2 midnights  PT Class (Do Not Modify): Inpatient [101]  PT Acc Code (Do Not Modify): Private [1]       B Medical/Surgery History Past Medical History:  Diagnosis Date  . Allergy   . Breast cancer ([place]) 07/22/2012   T1c, Nx carcinoma left breast, ER 90%, PR 90%, HER-2/neu not over expressed.  . Hypertension   . Insomnia   . Osteoporosis    Prolia 10/2012  . Pulmonary embolus ([place]) 2009  . S/P IVC filter 2009   Past Surgical History:  Procedure Laterality Date  . BLADDER SURGERY    . BREAST SURGERY Left 2013   mastectomy  . CHOLECYSTECTOMY    . HERNIA REPAIR    . MASTECTOMY Left 2013   BREAST CA  .  REPLACEMENT TOTAL KNEE     right  . TONSILLECTOMY       A IV Location/Drains/Wounds Patient Lines/Drains/Airways Status   Active Line/Drains/Airways    Name:   Placement date:   Placement time:   Site:   Days:   Peripheral IV 03/31/19 Right Antecubital   03/31/19    1624    Antecubital   less than 1          Intake/Output Last 24 hours No intake or output data in the 24 hours ending 03/31/19 1923  Labs/Imaging Results for orders placed or performed during the hospital encounter of 03/31/19 (from the past 48 hour(s))  Basic metabolic panel     Status: Abnormal   Collection Time: 03/31/19  1:04 PM  Result Value Ref Range   Sodium 141 135 - 145 mmol/L   Potassium 3.9 3.5 - 5.1 mmol/L   Chloride 108 98 - 111 mmol/L   CO2 23 22 - 32 mmol/L   Glucose, Bld 102 (H) 70 - 99 mg/dL   BUN 15 8 - 23 mg/dL   Creatinine, Ser 1.00 0.44 - 1.00 mg/dL   Calcium 9.0 8.9 - 10.3 mg/dL   GFR calc non Af Amer 49 (L) >60 mL/min   GFR calc Af Amer 57 (L) >60 mL/min   Anion gap 10 5 - 15    Comment: Performed at [facility], [address], [place], [place] [address]  CBC     Status:  None   Collection Time: 03/31/19  1:04 PM  Result Value Ref Range   WBC 6.7 4.0 - 10.5 K/uL   RBC 4.67 3.87 - 5.11 MIL/uL   Hemoglobin 13.7 12.0 - 15.0 g/dL   HCT 41.8 36.0 - 46.0 %   MCV 89.5 80.0 - 100.0 fL   MCH 29.3 26.0 - 34.0 pg   MCHC 32.8 30.0 - 36.0 g/dL   RDW 13.5 11.5 - 15.5 %   Platelets 235 150 - 400 K/uL   nRBC 0.0 0.0 - 0.2 %    Comment: Performed at [facility], [address] [place], [place] [address]  Protime-INR     Status: None   Collection Time: 03/31/19  4:46 PM  Result Value Ref Range   Prothrombin Time 15.1 11.4 - 15.2 seconds   INR 1.2 0.8 - 1.2    Comment: (NOTE) INR goal varies based on device and disease states. Performed at [facility], [place], [place] [address]   Troponin I (High Sensitivity)     Status: None   Collection  Time: 03/31/19  4:46 PM  Result Value Ref Range   Troponin I (High Sensitivity) 6 <18 ng/L    Comment: (NOTE) Elevated high sensitivity troponin I (hsTnI) values and significant  changes across serial measurements may suggest ACS but many other  chronic and acute conditions are known to elevate hsTnI results.  Refer to the "Links" section for chest pain algorithms and additional  guidance. Performed at [facility], [place], [place] [address]   Troponin I (High Sensitivity)     Status: None   Collection Time: 03/31/19  6:23 PM  Result Value Ref Range   Troponin I (High Sensitivity) 6 <18 ng/L    Comment: (NOTE) Elevated high sensitivity troponin I (hsTnI) values and significant  changes across serial measurements may suggest ACS but many other  chronic and acute conditions are known to elevate hsTnI results.  Refer to the "Links" section for chest pain algorithms and additional  guidance. Performed at [facility], [address] [place], [place] [address]   TSH     Status: None   Collection Time: 03/31/19  6:23 PM  Result Value Ref Range   TSH 2.635 0.350 - 4.500 uIU/mL    Comment: Performed by a 3rd Generation assay with a functional sensitivity of <=0.01 uIU/mL. Performed at [facility], [place],  [address]    Ct Head Wo Contrast  Result Date: 03/31/2019 CLINICAL DATA:  Dizziness and near syncopal episodes after starting blood pressure medication recently. EXAM: CT HEAD WITHOUT CONTRAST TECHNIQUE: Contiguous axial images were obtained from the base of the skull through the vertex without intravenous contrast. COMPARISON:  03/19/2011. Brain MR dated 03/06/2019 and 03/19/2011. FINDINGS: Brain: Mild-to-moderate enlargement of the ventricles and subarachnoid spaces. Moderate patchy white matter low density in both cerebral hemispheres. No intracranial hemorrhage, mass lesion or CT evidence of acute infarction.  Vascular: No hyperdense vessel or unexpected calcification. Skull: Mild bilateral hyperostosis frontalis. Sinuses/Orbits: Status post bilateral cataract extraction. Left maxillary sinus air-fluid level. Right sphenoid sinus air-fluid level without significant change since 03/06/2019 and on 03/19/2011. Other: None. IMPRESSION: 1. No acute abnormality. 2. Mild to moderate diffuse cerebral and cerebellar atrophy. 3. Moderate chronic small vessel white matter ischemic changes in both cerebral hemispheres. 4. Stable left maxillary and right sphenoid sinus chronic effusions. Electronically Signed   By: Percell Locus.D.  On: 03/31/2019 16:41    Pending Labs Unresulted Labs (From admission, onward)    Start     Ordered   03/31/19 1301  Urinalysis, Complete w Microscopic  ONCE - STAT,   STAT     03/31/19 1300          Vitals/Pain Today's Vitals   03/31/19 1730 03/31/19 1800 03/31/19 1828 03/31/19 1828  BP: (!) 145/85 (!) 149/76    Pulse: 71 65    Resp: 18 13    Temp:      TempSrc:      SpO2: 96% 96%    Weight:      Height:      PainSc:   0-No pain 0-No pain    Isolation Precautions No active isolations  Medications Medications  0.9 %  sodium chloride infusion (has no administration in time range)  metoprolol tartrate (LOPRESSOR) tablet 25 mg (has no administration in time range)  metoprolol tartrate (LOPRESSOR) injection 5 mg (5 mg Intravenous Given 03/31/19 1916)    Mobility walks with device Moderate fall risk   Focused Assessments Cardiac Assessment Handoff:  Cardiac Rhythm: Normal sinus rhythm No results found for: CKTOTAL, CKMB, CKMBINDEX, TROPONINI No results found for: DDIMER Does the Patient currently have chest pain? No      R Recommendations: See Admitting Provider Note  Report given to:   Additional Notes:

## 2019-03-31 NOTE — Progress Notes (Signed)
PHARMACIST - PHYSICIAN ORDER COMMUNICATION  CONCERNING: P&T Medication Policy on Herbal Medications  DESCRIPTION:  This patient's order for:  Biotin  has been noted.  This product(s) is classified as an "herbal" or natural product. Due to a lack of definitive safety studies or FDA approval, nonstandard manufacturing practices, plus the potential risk of unknown drug-drug interactions while on inpatient medications, the Pharmacy and Therapeutics Committee does not permit the use of "herbal" or natural products of this type within [name].   ACTION TAKEN: The pharmacy department is unable to verify this order at this time and your patient has been informed of this safety policy. Please reevaluate patient's clinical condition at discharge and address if the herbal or natural product(s) should be resumed at that time.  Paulina Fusi, PharmD, BCPS 03/31/2019 8:32 PM

## 2019-03-31 NOTE — ED Notes (Signed)
ED TO INPATIENT HANDOFF REPORT  ED Nurse Name and Phone #:  Mac [phone]  S Name/Age/Gender [name] 85 y.o. female Room/Bed: ED25A/ED25A  Code Status   Code Status: Full Code  Home/SNF/Other Home Patient oriented to: self, place, time and situation Is this baseline? Yes   Triage Complete: Triage complete  Chief Complaint dizziness  Triage Note Reports she recently started BP medications and has been having dizziness and near syncope with standing. Pt has never been on BP medications before, referred by PCP. No syncope. Pt alert and oriented X4, cooperative, RR even and unlabored, color WNL. Pt in NAD.    Allergies Allergies  Allergen Reactions  . Erythromycin   . Sulfa Drugs Cross Reactors     rash    Level of Care/Admitting Diagnosis ED Disposition    ED Disposition Condition [facility]: [place] [[id number]]  Level of Care: Telemetry [5]  Covid Evaluation: Asymptomatic Screening Protocol (No Symptoms)  Diagnosis: Postural dizziness with near syncope [[id number]]  Admitting Physician: Lang Snow [[id number]]  Attending Physician: Rufina Falco ACHIENG [[id number]]  Estimated length of stay: past midnight tomorrow  Certification:: I certify this patient will need inpatient services for at least 2 midnights  PT Class (Do Not Modify): Inpatient [101]  PT Acc Code (Do Not Modify): Private [1]       B Medical/Surgery History Past Medical History:  Diagnosis Date  . Allergy   . Breast cancer ([place]) 07/22/2012   T1c, Nx carcinoma left breast, ER 90%, PR 90%, HER-2/neu not over expressed.  . Hypertension   . Insomnia   . Osteoporosis    Prolia 10/2012  . Pulmonary embolus ([place]) 2009  . S/P IVC filter 2009   Past Surgical History:  Procedure Laterality Date  . BLADDER SURGERY    . BREAST SURGERY Left 2013   mastectomy  . CHOLECYSTECTOMY    . HERNIA REPAIR    . MASTECTOMY Left 2013   BREAST CA  . REPLACEMENT  TOTAL KNEE     right  . TONSILLECTOMY       A IV Location/Drains/Wounds Patient Lines/Drains/Airways Status   Active Line/Drains/Airways    Name:   Placement date:   Placement time:   Site:   Days:   Peripheral IV 03/31/19 Right Antecubital   03/31/19    1624    Antecubital   less than 1          Intake/Output Last 24 hours No intake or output data in the 24 hours ending 03/31/19 1826  Labs/Imaging Results for orders placed or performed during the hospital encounter of 03/31/19 (from the past 48 hour(s))  Basic metabolic panel     Status: Abnormal   Collection Time: 03/31/19  1:04 PM  Result Value Ref Range   Sodium 141 135 - 145 mmol/L   Potassium 3.9 3.5 - 5.1 mmol/L   Chloride 108 98 - 111 mmol/L   CO2 23 22 - 32 mmol/L   Glucose, Bld 102 (H) 70 - 99 mg/dL   BUN 15 8 - 23 mg/dL   Creatinine, Ser 1.00 0.44 - 1.00 mg/dL   Calcium 9.0 8.9 - 10.3 mg/dL   GFR calc non Af Amer 49 (L) >60 mL/min   GFR calc Af Amer 57 (L) >60 mL/min   Anion gap 10 5 - 15    Comment: Performed at [facility], [address] [place],  [address]  CBC  Status: None   Collection Time: 03/31/19  1:04 PM  Result Value Ref Range   WBC 6.7 4.0 - 10.5 K/uL   RBC 4.67 3.87 - 5.11 MIL/uL   Hemoglobin 13.7 12.0 - 15.0 g/dL   HCT 41.8 36.0 - 46.0 %   MCV 89.5 80.0 - 100.0 fL   MCH 29.3 26.0 - 34.0 pg   MCHC 32.8 30.0 - 36.0 g/dL   RDW 13.5 11.5 - 15.5 %   Platelets 235 150 - 400 K/uL   nRBC 0.0 0.0 - 0.2 %    Comment: Performed at [facility], [address] [place], [place] [address]  Protime-INR     Status: None   Collection Time: 03/31/19  4:46 PM  Result Value Ref Range   Prothrombin Time 15.1 11.4 - 15.2 seconds   INR 1.2 0.8 - 1.2    Comment: (NOTE) INR goal varies based on device and disease states. Performed at [facility], [place], [place] [address]   Troponin I (High Sensitivity)     Status: None   Collection Time:  03/31/19  4:46 PM  Result Value Ref Range   Troponin I (High Sensitivity) 6 <18 ng/L    Comment: (NOTE) Elevated high sensitivity troponin I (hsTnI) values and significant  changes across serial measurements may suggest ACS but many other  chronic and acute conditions are known to elevate hsTnI results.  Refer to the "Links" section for chest pain algorithms and additional  guidance. Performed at [facility], [place], [place] [address]    Ct Head Wo Contrast  Result Date: 03/31/2019 CLINICAL DATA:  Dizziness and near syncopal episodes after starting blood pressure medication recently. EXAM: CT HEAD WITHOUT CONTRAST TECHNIQUE: Contiguous axial images were obtained from the base of the skull through the vertex without intravenous contrast. COMPARISON:  03/19/2011. Brain MR dated 03/06/2019 and 03/19/2011. FINDINGS: Brain: Mild-to-moderate enlargement of the ventricles and subarachnoid spaces. Moderate patchy white matter low density in both cerebral hemispheres. No intracranial hemorrhage, mass lesion or CT evidence of acute infarction. Vascular: No hyperdense vessel or unexpected calcification. Skull: Mild bilateral hyperostosis frontalis. Sinuses/Orbits: Status post bilateral cataract extraction. Left maxillary sinus air-fluid level. Right sphenoid sinus air-fluid level without significant change since 03/06/2019 and on 03/19/2011. Other: None. IMPRESSION: 1. No acute abnormality. 2. Mild to moderate diffuse cerebral and cerebellar atrophy. 3. Moderate chronic small vessel white matter ischemic changes in both cerebral hemispheres. 4. Stable left maxillary and right sphenoid sinus chronic effusions. Electronically Signed   By: Claudie Revering M.D.   On: 03/31/2019 16:41    Pending Labs Unresulted Labs (From admission, onward)    Start     Ordered   03/31/19 1813  TSH  Add-on,   AD     03/31/19 1813   03/31/19 1301  Urinalysis, Complete w Microscopic  ONCE - STAT,   STAT      03/31/19 1300          Vitals/Pain Today's Vitals   03/31/19 1630 03/31/19 1700 03/31/19 1730 03/31/19 1800  BP: (!) 145/76 (!) 145/69 (!) 145/85 (!) 149/76  Pulse: 62 63 71 65  Resp:  _0 Temp:      TempSrc:      SpO2: 98% 98% 96% 96%  Weight:      Height:      PainSc:        Isolation Precautions No active isolations  Medications Medications  0.9 %  sodium chloride infusion (has no administration in time range)    Mobility walks with device Moderate fall risk   Focused Assessments Cardiac Assessment Handoff:  Cardiac Rhythm: Normal sinus rhythm No results found for: CKTOTAL, CKMB, CKMBINDEX, TROPONINI No results found for: DDIMER Does the Patient currently have chest pain? No      R Recommendations: See Admitting Provider Note  Report given to:   Additional Notes:  Son at bedside

## 2019-03-31 NOTE — ED Notes (Signed)
Attempted to call report at this time 

## 2019-03-31 NOTE — ED Provider Notes (Signed)
[facility] Emergency Department Provider Note  ____________________________________________   First MD Initiated Contact with Patient 03/31/19 1551     (approximate)  I have reviewed the triage vital signs and the nursing notes.   HISTORY  Chief Complaint Dizziness    HPI [name] is a 85 y.o. female  With h/o PE, on Coumadin, HTN, breast CA here with intermittent dizziness. Pt states that since starting a new BP med last week, she has had persistent itnermittent dizziness upon standing, worse in the mornings after she takes her medication. She's had an occasional lightheadeness and fluttering feeling in her chest with this, along with weakness. Seems to come and go when she stands up. No alleviating factors. No h/o AFib. No overt CP. No abd pain, n/v. She does admit to poor PO intake since being in quarantine at her ALF. No recent fever, chills. Has been taking all her other meds as prescribed. The dizziness is more so lightheadedness and she denies any accompanying HA, focal numbness or weakness. No falls.       Past Medical History:  Diagnosis Date  . Allergy   . Breast cancer ([place]) 07/22/2012   T1c, Nx carcinoma left breast, ER 90%, PR 90%, HER-2/neu not over expressed.  . Hypertension   . Insomnia   . Osteoporosis    Prolia 10/2012  . Pulmonary embolus ([place]) 2009  . S/P IVC filter 2009    Patient Active Problem List   Diagnosis Date Noted  . 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 Ankle tightness 01/04/2019  . Neck pain 12/13/2018  . Ear pain 12/13/2018  . Subconjunctival hemorrhage of left eye 02/13/2018  . Positional right arm tingling while sleeping 10/01/2017  . Anxiety 12/14/2016  . Nocturia 12/14/2016  . Varicose veins of both lower extremities 09/11/2016  . Rash and nonspecific skin eruption 07/13/2016  . Dry eyes 06/11/2016  . Fall 03/11/2016  . Tremor 01/16/2016  . Seasonal allergic rhinitis 02/28/2015  . Diarrhea  01/15/2015  . Cold intolerance 11/26/2014  . Osteoarthritis of left knee 08/31/2014  . Gait instability 08/31/2014  . Memory loss 08/31/2014  . Seborrheic keratoses 12/28/2012  . Osteopenia 09/22/2012  . History of breast cancer 06/02/2012  . Depression 01/08/2012  . History of pulmonary embolus (PE) 12/03/2011  . Insomnia 05/13/2011  . Hypertension 05/13/2011    Past Surgical History:  Procedure Laterality Date  . BLADDER SURGERY    . BREAST SURGERY Left 2013   mastectomy  . CHOLECYSTECTOMY    . HERNIA REPAIR    . MASTECTOMY Left 2013   BREAST CA  . REPLACEMENT TOTAL KNEE     right  . TONSILLECTOMY      Prior to Admission medications   Medication Sig Start Date End Date Taking? Authorizing Provider  albuterol (PROVENTIL HFA;VENTOLIN HFA) 108 (90 Base) MCG/ACT inhaler Inhale 2 puffs into the lungs every 6 (six) hours as needed for wheezing or shortness of breath. 09/01/18   Jodelle Green, FNP  ALPRAZolam Duanne Moron) 0.5 MG tablet Take 0.5 tablets (0.25 mg total) by mouth at bedtime as needed for anxiety or sleep. 02/07/19   [name], MD  amLODipine (NORVASC) 2.5 MG tablet Take 1 tablet (2.5 mg total) by mouth daily. 03/20/19   [name], MD  baclofen (LIORESAL) 10 MG tablet Take 1 tablet (10 mg total) by mouth 3 (three) times daily as needed for muscle spasms. 12/12/18   [name], MD  Biotin 10 MG  TABS Take 10 mg by mouth daily.     [provider]  bisoprolol (ZEBETA) 5 MG tablet Take 1 tablet (5 mg total) by mouth daily. 01/04/19   [name], MD  Cholecalciferol (VITAMIN D PO) Take 2,000 mg by mouth daily.    [provider]  denosumab (PROLIA) 60 MG/ML SOSY injection Prolia 60 mg/mL subcutaneous syringe  Inject 1 mL by subcutaneous route.    [provider]  diphenoxylate-atropine (LOMOTIL) 2.5-0.025 MG tablet Take 1 tablet by mouth 4 (four) times daily as needed for diarrhea or loose stools. 12/01/17   [name], MD  Fluocinolone Acetonide 0.01 % OIL  02/03/19   [provider]  fluticasone (FLONASE) 50 MCG/ACT nasal spray Place 2 sprays into both nostrils daily. 03/17/19   [name], MD  hydrochlorothiazide (HYDRODIURIL) 12.5 MG tablet Take 12.5 mg by mouth daily.  01/08/19   [provider]  letrozole (FEMARA) 2.5 MG tablet Take 2.5 mg by mouth daily.  11/04/18   [provider]  mirtazapine (REMERON) 7.5 MG tablet Take 1 tablet (7.5 mg total) by mouth at bedtime. 11/01/17   [name], MD  montelukast (SINGULAIR) 10 MG tablet Take 1 tablet (10 mg total) by mouth at bedtime. 03/20/19   [name], MD  warfarin (COUMADIN) 1 MG tablet Take 1 mg by mouth on Tuesday and Saturday 02/07/19   [name], MD  warfarin (COUMADIN) 3 MG tablet Take 1 tablet (3 mg total) by mouth daily. 02/07/19   [name], MD  XIIDRA 5 % SOLN Place 1-2 drops into both eyes daily.  12/06/17   [provider]    Allergies Erythromycin and Sulfa drugs cross reactors  Family History  Problem Relation Age of Onset  . COPD Mother   . Stroke Father   . Cancer Father        breast  . Breast cancer Maternal Aunt     Social History Social History   Tobacco Use  . Smoking status: Former Smoker    Quit date: 05/13/1999    Years since quitting: 19.8  . Smokeless tobacco: Never Used  Substance Use Topics  . Alcohol use: No  . Drug use: No    Review of Systems  Review of Systems  Constitutional: Positive for fatigue. Negative for fever.  HENT: Negative for congestion and sore throat.   Eyes: Negative for visual disturbance.  Respiratory: Negative for cough and shortness of breath.   Cardiovascular: Positive for palpitations. Negative for chest pain.  Gastrointestinal: Negative for abdominal pain, diarrhea, nausea and vomiting.  Genitourinary: Negative for flank pain.  Musculoskeletal: Negative for back pain and neck pain.  Skin: Negative for rash  and wound.  Neurological: Positive for dizziness, weakness and light-headedness.  All other systems reviewed and are negative.    ____________________________________________  PHYSICAL EXAM:      VITAL SIGNS: ED Triage Vitals [03/31/19 1259]  Enc Vitals Group     BP 113/79     Pulse Rate 86     Resp 18     Temp 98 F (36.7 C)     Temp Source Oral     SpO2 98 %     Weight 123 lb 7.3 oz (56 kg)     Height 5' (1.524 m)     Head Circumference      Peak Flow      Pain Score 0     Pain Loc  Pain Edu?      Excl. in [place]?      Physical Exam Vitals signs and nursing note reviewed.  Constitutional:      General: She is not in acute distress.    Appearance: She is well-developed.  HENT:     Head: Normocephalic and atraumatic.  Eyes:     Conjunctiva/sclera: Conjunctivae normal.  Neck:     Musculoskeletal: Neck supple.  Cardiovascular:     Rate and Rhythm: Normal rate and regular rhythm.     Heart sounds: Normal heart sounds. No murmur. No friction rub.  Pulmonary:     Effort: Pulmonary effort is normal. No respiratory distress.     Breath sounds: Normal breath sounds. No wheezing or rales.  Abdominal:     General: There is no distension.     Palpations: Abdomen is soft.     Tenderness: There is no abdominal tenderness.  Skin:    General: Skin is warm.     Capillary Refill: Capillary refill takes less than 2 seconds.  Neurological:     Mental Status: She is alert and oriented to person, place, and time.     Motor: No abnormal muscle tone.      Neurological Exam:  Mental Status: Alert and oriented to person, place, and time. Attention and concentration normal. Speech clear. Recent memory is intact. Cranial Nerves: Visual fields grossly intact. EOMI and PERRLA. No nystagmus noted. Facial sensation intact at forehead, maxillary cheek, and chin/mandible bilaterally. No facial asymmetry or weakness. Hearing grossly normal. Uvula is midline, and palate elevates  symmetrically. Normal SCM and trapezius strength. Tongue midline without fasciculations. Motor: Muscle strength 5/5 in proximal and distal UE and LE bilaterally. No pronator drift. Muscle tone normal. Reflexes: 2+ and symmetrical in all four extremities.  Sensation: Intact to light touch in upper and lower extremities distally bilaterally.  Gait: Normal without ataxia. Coordination: Normal FTN bilaterally.  ____________________________________________   LABS (all labs ordered are listed, but only abnormal results are displayed)  Labs Reviewed  BASIC METABOLIC PANEL - Abnormal; Notable for the following components:      Result Value   Glucose, Bld 102 (*)    GFR calc non Af Amer 49 (*)    GFR calc Af Amer 57 (*)    All other components within normal limits  CBC  PROTIME-INR  URINALYSIS, COMPLETE (UACMP) WITH MICROSCOPIC  CBG MONITORING, ED  TROPONIN I (HIGH SENSITIVITY)  TROPONIN I (HIGH SENSITIVITY)    ____________________________________________  EKG: Normal sinus rhythm, VR 79. PR 136, QRS 88, QTc 442. Non-specific anterior TWC, no acute ST elevations or depressions. ________________________________________  RADIOLOGY All imaging, including plain films, CT scans, and ultrasounds, independently reviewed by me, and interpretations confirmed via formal radiology reads.  ED MD interpretation:   CT head: No acute abnormality  Official radiology report(s): Ct Head Wo Contrast  Result Date: 03/31/2019 CLINICAL DATA:  Dizziness and near syncopal episodes after starting blood pressure medication recently. EXAM: CT HEAD WITHOUT CONTRAST TECHNIQUE: Contiguous axial images were obtained from the base of the skull through the vertex without intravenous contrast. COMPARISON:  03/19/2011. Brain MR dated 03/06/2019 and 03/19/2011. FINDINGS: Brain: Mild-to-moderate enlargement of the ventricles and subarachnoid spaces. Moderate patchy white matter low density in both cerebral hemispheres. No  intracranial hemorrhage, mass lesion or CT evidence of acute infarction. Vascular: No hyperdense vessel or unexpected calcification. Skull: Mild bilateral hyperostosis frontalis. Sinuses/Orbits: Status post bilateral cataract extraction. Left maxillary sinus air-fluid level. Right sphenoid sinus air-fluid  level without significant change since 03/06/2019 and on 03/19/2011. Other: None. IMPRESSION: 1. No acute abnormality. 2. Mild to moderate diffuse cerebral and cerebellar atrophy. 3. Moderate chronic small vessel white matter ischemic changes in both cerebral hemispheres. 4. Stable left maxillary and right sphenoid sinus chronic effusions. Electronically Signed   By: Claudie Revering M.D.   On: 03/31/2019 16:41    ____________________________________________  PROCEDURES   Procedure(s) performed (including Critical Care):  Procedures  ____________________________________________  INITIAL IMPRESSION / MDM / [place] / ED COURSE  As part of my medical decision making, I reviewed the following data within the electronic MEDICAL RECORD NUMBER Notes from prior ED visits and [place] Controlled Substance Database      *Roselyne Stalnaker was evaluated in Emergency Department on 03/31/2019 for the symptoms described in the history of present illness. She was evaluated in the context of the global COVID-19 pandemic, which necessitated consideration that the patient might be at risk for infection with the SARS-CoV-2 virus that causes COVID-19. Institutional protocols and algorithms that pertain to the evaluation of patients at risk for COVID-19 are in a state of rapid change based on information released by regulatory bodies including the CDC and federal and state organizations. These policies and algorithms were followed during the patient's care in the ED.  Some ED evaluations and interventions may be delayed as a result of limited staffing during the pandemic.*   Clinical Course as of Mar 30 1757  Fri Mar 31, 2019  1740 85 yo F here with intermittent dizziness, lightheadedness. Seems to occur most in AM.    [CI]  1744 On telemetry, pt noted to go into what appears to be intermittent AFib vs SVT. This occurs randomly but seems worse w/ exertion. She is not orthostatic. Labs are reassuring. Trop neg, doubt ischemic etiology. Suspect her dizziness could be 2/2 transient atrial arrhythmia, must also consider her underlying sinusitis though this is chronic in nature.    [CI]    Clinical Course User Index [CI] Duffy Bruce, MD    Medical Decision Making: As above.  ____________________________________________  FINAL CLINICAL IMPRESSION(S) / ED DIAGNOSES  Final diagnoses:  Atrial arrhythmia  Dizziness  Lightheaded     MEDICATIONS GIVEN DURING THIS VISIT:  Medications - No data to display   ED Discharge Orders    None       Note:  This document was prepared using Dragon voice recognition software and may include unintentional dictation errors.   Duffy Bruce, MD 03/31/19 1758

## 2019-03-31 NOTE — Telephone Encounter (Signed)
Patient says since starting the amlodipine she says he has been having dizziness and she just feels strange since starting this medication. Patient says she is having to fix her medication herself because nurse at [facility] tested positive for COVID, then patient started to mumble that she feels really strange and nothing is right and she feels like she is having another brain bleed, advised patient she needs to call 911 on go the hospital patient refused.called Langley Gauss nurse aide at [facility] and advised her of conversation with patient and that I was advising ER evaluation.

## 2019-03-31 NOTE — Progress Notes (Signed)
Called for report but RN is unable to take report

## 2019-03-31 NOTE — Consult Note (Signed)
ANTICOAGULATION CONSULT NOTE - Follow Up Consult  Pharmacy Consult for Warfarin Dosing  Indication: VTE Treatment  Allergies  Allergen Reactions  . Erythromycin   . Sulfa Drugs Cross Reactors     rash    Patient Measurements: Height: 5' (152.4 cm) Weight: 123 lb 7.3 oz (56 kg) IBW/kg (Calculated) : 45.5   Vital Signs: Temp: 98 F (36.7 C) (09/04 1259) Temp Source: Oral (09/04 1259) BP: 134/82 (09/04 1930) Pulse Rate: 79 (09/04 1930)  Labs: Recent Labs    03/31/19 1304 03/31/19 1646 03/31/19 1823  HGB 13.7  --   --   HCT 41.8  --   --   PLT 235  --   --   LABPROT  --  15.1  --   INR  --  1.2  --   CREATININE 1.00  --   --   TROPONINIHS  --  6 6    Estimated Creatinine Clearance: 28.8 mL/min (by C-G formula based on SCr of 1 mg/dL).   Assessment: Pharmacy consulted for warfarin dosing and monitoring in 85 yo female with PMH of PE (S/P IVC filer). INR subtherapeutic on admission.   Home Regimen: Warfarin 1mg  Tues, Sat                               Warfarin 3mg  Mon, Wed, Fri  Goal of Therapy:  INR 2-3 Monitor platelets by anticoagulation protocol: Yes   Plan:  9/4 INR subtherapeutic. Will order Warfarin 4mg  po x 1 dose tonight.   INR ordered with AM labs.   Pharmacy will continue to order doses based on levels. CBC at least every 3 days per protocol.   Pernell Dupre, PharmD, BCPS Clinical Pharmacist 03/31/2019 8:34 PM

## 2019-03-31 NOTE — H&P (Signed)
[name] at [facility] NAME: [name]    MR#:  [id number]  DATE OF BIRTH:  04-08-1926  DATE OF ADMISSION:  03/31/2019  PRIMARY CARE PHYSICIAN: [name], MD   REQUESTING/REFERRING PHYSICIAN: Duffy Bruce, MD  CHIEF COMPLAINT:   Chief Complaint  Patient presents with   Dizziness    HISTORY OF PRESENT ILLNESS:  85 year old with history of pulmonary embolism status post IVC filter on Coumadin, hypertension, breast cancer, depression, osteoporosis on Prolia and insomnia presenting to the ED with chief complaints of dizziness and near syncope.  Patient states she was started on new blood pressure medication amlodipine by her PCP on 03/21/2019.  Prior to that she was evaluated by ENT for pulsatile tinnitus, work-up at that time with MRI, MRV, MRA revealed no acute finding that showed chronic pontine microhemorrhage.  She was referred to neurology Dr. Jennings Books who thought the microhemorrhages were likely related to hypertension, Coumadin use in older age.  Per neurologist recommendation, patient was to have her blood pressure monitored closely due to high risk for ICH.  Patient states she started taking the amlodipine about 10 days ago.  Since then she has developed intermittent dizziness with near syncopal episode.  Denies associated symptoms of nausea or vomiting, headache, chest pain, shortness of breath, palpitation or other associated symptoms.  On arrival to the ED, he was afebrile with blood pressure 153/84 mm Hg and pulse rate 80 beats/min. There were no focal neurological deficits; she was alert and oriented x4, and he did not demonstrate any memory deficits.  Patient labs revealed glucose 109, CBC unremarkable, PT 15.1, INR 1.2, troponin negative.  Urinalysis shows trace leukocytes and rare bacteria.  CT head was obtained and showed no acute intracranial abnormality to explain her dizziness.  In telemetry patient was noted to go  into what appears to be intermittent A. fib versus SVT.  This occurs randomly but worse with exertion.  She is currently asymptomatic.  Given this findings patient will be admitted under hospitalist service for further management.  PAST MEDICAL HISTORY:   Past Medical History:  Diagnosis Date   Allergy    Breast cancer ([place]) 07/22/2012   T1c, Nx carcinoma left breast, ER 90%, PR 90%, HER-2/neu not over expressed.   Hypertension    Insomnia    Osteoporosis    Prolia 10/2012   Pulmonary embolus ([place]) 2009   S/P IVC filter 2009    PAST SURGICAL HISTORY:   Past Surgical History:  Procedure Laterality Date   BLADDER SURGERY     BREAST SURGERY Left 2013   mastectomy   CHOLECYSTECTOMY     HERNIA REPAIR     MASTECTOMY Left 2013   BREAST CA   REPLACEMENT TOTAL KNEE     right   TONSILLECTOMY      SOCIAL HISTORY:   Social History   Tobacco Use   Smoking status: Former Smoker    Quit date: 05/13/1999    Years since quitting: 19.8   Smokeless tobacco: Never Used  Substance Use Topics   Alcohol use: No    FAMILY HISTORY:   Family History  Problem Relation Age of Onset   COPD Mother    Stroke Father    Cancer Father        breast   Breast cancer Maternal Aunt     DRUG ALLERGIES:   Allergies  Allergen Reactions   Erythromycin    Sulfa Drugs Cross Reactors  rash    REVIEW OF SYSTEMS:   Review of Systems  Constitutional: Negative for chills, fever, malaise/fatigue and weight loss.  HENT: Positive for hearing loss. Negative for congestion and sore throat.   Eyes: Negative for blurred vision and double vision.  Respiratory: Negative for cough, shortness of breath and wheezing.   Cardiovascular: Negative for chest pain, palpitations, orthopnea and leg swelling.  Gastrointestinal: Negative for abdominal pain, diarrhea, nausea and vomiting.  Genitourinary: Negative for dysuria and urgency.  Musculoskeletal: Positive for back pain and joint  pain. Negative for myalgias.  Skin: Negative for rash.  Neurological: Positive for dizziness. Negative for sensory change, speech change, focal weakness and headaches.  Psychiatric/Behavioral: Positive for depression. The patient is nervous/anxious and has insomnia.    MEDICATIONS AT HOME:   Prior to Admission medications   Medication Sig Start Date End Date Taking? Authorizing Provider  albuterol (PROVENTIL HFA;VENTOLIN HFA) 108 (90 Base) MCG/ACT inhaler Inhale 2 puffs into the lungs every 6 (six) hours as needed for wheezing or shortness of breath. 09/01/18   Jodelle Green, FNP  ALPRAZolam Duanne Moron) 0.5 MG tablet Take 0.5 tablets (0.25 mg total) by mouth at bedtime as needed for anxiety or sleep. 02/07/19   [name], MD  amLODipine (NORVASC) 2.5 MG tablet Take 1 tablet (2.5 mg total) by mouth daily. 03/20/19   [name], MD  baclofen (LIORESAL) 10 MG tablet Take 1 tablet (10 mg total) by mouth 3 (three) times daily as needed for muscle spasms. 12/12/18   [name], MD  Biotin 10 MG TABS Take 10 mg by mouth daily.     [provider]  bisoprolol (ZEBETA) 5 MG tablet Take 1 tablet (5 mg total) by mouth daily. 01/04/19   [name], MD  Cholecalciferol (VITAMIN D PO) Take 2,000 mg by mouth daily.    [provider]  denosumab (PROLIA) 60 MG/ML SOSY injection Prolia 60 mg/mL subcutaneous syringe  Inject 1 mL by subcutaneous route.    [provider]  diphenoxylate-atropine (LOMOTIL) 2.5-0.025 MG tablet Take 1 tablet by mouth 4 (four) times daily as needed for diarrhea or loose stools. 12/01/17   [name], MD  Fluocinolone Acetonide 0.01 % OIL  02/03/19   [provider]  fluticasone (FLONASE) 50 MCG/ACT nasal spray Place 2 sprays into both nostrils daily. 03/17/19   [name], MD  hydrochlorothiazide (HYDRODIURIL) 12.5 MG tablet Take 12.5 mg by mouth daily.  01/08/19   [provider]  letrozole (FEMARA) 2.5  MG tablet Take 2.5 mg by mouth daily.  11/04/18   [provider]  mirtazapine (REMERON) 7.5 MG tablet Take 1 tablet (7.5 mg total) by mouth at bedtime. 11/01/17   [name], MD  montelukast (SINGULAIR) 10 MG tablet Take 1 tablet (10 mg total) by mouth at bedtime. 03/20/19   [name], MD  warfarin (COUMADIN) 1 MG tablet Take 1 mg by mouth on Tuesday and Saturday 02/07/19   [name], MD  warfarin (COUMADIN) 3 MG tablet Take 1 tablet (3 mg total) by mouth daily. 02/07/19   [name], MD  XIIDRA 5 % SOLN Place 1-2 drops into both eyes daily.  12/06/17   [provider]      VITAL SIGNS:  Blood pressure (!) 149/76, pulse 65, temperature 98 F (36.7 C), temperature source Oral, resp. rate 13, height 5' (1.524 m), weight 56 kg, SpO2 96 %.  PHYSICAL EXAMINATION:  Physical Exam  GENERAL:  85 y.o.-year-old patient lying in the bed with no acute distress.  EYES: Pupils equal, round, reactive to light and accommodation. No scleral icterus. Extraocular muscles intact.  HEENT: Head atraumatic, normocephalic. Oropharynx and nasopharynx clear.  NECK:  Supple, no jugular venous distention. No thyroid enlargement, no tenderness.  LUNGS: Normal breath sounds bilaterally, no wheezing, rales,rhonchi or crepitation. No use of accessory muscles of respiration.  CARDIOVASCULAR: S1, S2 normal. Irregular rhythm.  No murmurs, rubs, or gallops.  ABDOMEN: Soft, nontender, nondistended. Bowel sounds present. No organomegaly or mass.  EXTREMITIES: No pedal edema, cyanosis, or clubbing. No rash or lesions. + pedal pulses MUSCULOSKELETAL: Normal bulk, and power was 5+ grip and elbow, knee, and ankle flexion and extension bilaterally.  NEUROLOGIC: Alert and oriented x 3. CN 2-12 intact. Sensation to light touch and cold stimuli intact bilaterally. Gait not tested due to safety concern. PSYCHIATRIC: The patient is alert and oriented x 3.  SKIN: No obvious rash, lesion, or  ulcer.   DATA REVIEWED:  LABORATORY PANEL:   CBC Recent Labs  Lab 03/31/19 1304  WBC 6.7  HGB 13.7  HCT 41.8  PLT 235   ------------------------------------------------------------------------------------------------------------------  Chemistries  Recent Labs  Lab 03/31/19 1304  NA 141  K 3.9  CL 108  CO2 23  GLUCOSE 102*  BUN 15  CREATININE 1.00  CALCIUM 9.0   ------------------------------------------------------------------------------------------------------------------  Cardiac Enzymes No results for input(s): TROPONINI in the last 168 hours. ------------------------------------------------------------------------------------------------------------------  RADIOLOGY:  Ct Head Wo Contrast  Result Date: 03/31/2019 CLINICAL DATA:  Dizziness and near syncopal episodes after starting blood pressure medication recently. EXAM: CT HEAD WITHOUT CONTRAST TECHNIQUE: Contiguous axial images were obtained from the base of the skull through the vertex without intravenous contrast. COMPARISON:  03/19/2011. Brain MR dated 03/06/2019 and 03/19/2011. FINDINGS: Brain: Mild-to-moderate enlargement of the ventricles and subarachnoid spaces. Moderate patchy white matter low density in both cerebral hemispheres. No intracranial hemorrhage, mass lesion or CT evidence of acute infarction. Vascular: No hyperdense vessel or unexpected calcification. Skull: Mild bilateral hyperostosis frontalis. Sinuses/Orbits: Status post bilateral cataract extraction. Left maxillary sinus air-fluid level. Right sphenoid sinus air-fluid level without significant change since 03/06/2019 and on 03/19/2011. Other: None. IMPRESSION: 1. No acute abnormality. 2. Mild to moderate diffuse cerebral and cerebellar atrophy. 3. Moderate chronic small vessel white matter ischemic changes in both cerebral hemispheres. 4. Stable left maxillary and right sphenoid sinus chronic effusions. Electronically Signed   By: Claudie Revering  M.D.   On: 03/31/2019 16:41    EKG:  EKG: unchanged from previous tracings, sinus arrhythmia. Vent. rate 79 BPM PR interval 136 ms QRS duration 88 ms QT/QTc 386/442 ms P-R-T axes 40 -34 57 IMPRESSION AND PLAN:   85 y.o. female with history of pulmonary embolism status post IVC filter on Coumadin, hypertension, breast cancer, depression, osteoporosis on Prolia and insomnia presenting to the ED with chief complaints of dizziness and near syncope.  1. Dizziness -likely secondary to transient atrial arrhythmia - Admit to telemetry unit - CT head reviewed and showed no acute intracranial abnormality - We will obtain echocardiogram for further evaluation - Starting metoprolol for rate control - Check TSH - Cardiology consult placed to Dr. Candis Musa  2. Pulmonary embolism status post IVC filter - Continue Coumadin - Monitor PT/INR  3. HTN  + Goal BP <130/80 - Stop amlodipine start metoprolol - Continue hydrochlorothiazide  4. Abnormal UA - Patient is symptomatic - Obtain urine culture - Hold off treatment pending cultures  5. Depression and Anxiety - Continue Remeron and Alprazolam  6. DVT prophylaxis - Therapeutically anti-coagulated with warfarin   All the records are reviewed and case discussed with ED provider. Management plans discussed with the patient, family and they are in agreement.  CODE STATUS: FULL  TOTAL TIME TAKING CARE OF THIS PATIENT: 50 minutes.    on 03/31/2019 at 6:15 PM  Rufina Falco, DNP, FNP-BC [facility] Nurse Practitioner Between 7am to 6pm - Pager [phone]  After 6pm go to [email or web address] - password [email or web address] [facility]  Office  [phone]  CC: Primary care physician; [name], MD

## 2019-03-31 NOTE — ED Triage Notes (Signed)
Reports she recently started BP medications and has been having dizziness and near syncope with standing. Pt has never been on BP medications before, referred by PCP. No syncope. Pt alert and oriented X4, cooperative, RR even and unlabored, color WNL. Pt in NAD.

## 2019-03-31 NOTE — ED Notes (Addendum)
EKG rhythm strip found intermittent vtach. EKG in chart. MD Isaacs aware.

## 2019-03-31 NOTE — Telephone Encounter (Signed)
Called patient and could not get answer left voicemail, but concerned no answer so [place] [facility] [phone] spoke with Langley Gauss have her check on patient and to have patient return call to office.

## 2019-04-01 DIAGNOSIS — R42 Dizziness and giddiness: Secondary | ICD-10-CM

## 2019-04-01 DIAGNOSIS — I498 Other specified cardiac arrhythmias: Secondary | ICD-10-CM

## 2019-04-01 DIAGNOSIS — Z86711 Personal history of pulmonary embolism: Secondary | ICD-10-CM

## 2019-04-01 DIAGNOSIS — I471 Supraventricular tachycardia: Principal | ICD-10-CM

## 2019-04-01 DIAGNOSIS — R55 Syncope and collapse: Secondary | ICD-10-CM

## 2019-04-01 LAB — PROTIME-INR
INR: 1.5 — ABNORMAL HIGH (ref 0.8–1.2)
Prothrombin Time: 17.6 seconds — ABNORMAL HIGH (ref 11.4–15.2)

## 2019-04-01 MED ORDER — METOPROLOL TARTRATE 50 MG PO TABS
50.0000 mg | ORAL_TABLET | Freq: Two times a day (BID) | ORAL | Status: DC
  Administered 2019-04-01 – 2019-04-02 (×2): 50 mg via ORAL
  Filled 2019-04-01 (×2): qty 1

## 2019-04-01 MED ORDER — METOPROLOL TARTRATE 25 MG PO TABS: 25.0000 mg | ORAL_TABLET | Freq: Once | ORAL | Status: DC

## 2019-04-01 MED ORDER — QUETIAPINE FUMARATE 25 MG PO TABS: 25.0000 mg | ORAL_TABLET | Freq: Every evening | ORAL | Status: DC | PRN

## 2019-04-01 MED ORDER — DILTIAZEM HCL 25 MG/5ML IV SOLN
10.0000 mg | Freq: Once | INTRAVENOUS | Status: AC
  Administered 2019-04-01: 10 mg via INTRAVENOUS
  Filled 2019-04-01: qty 5

## 2019-04-01 MED ORDER — WARFARIN SODIUM 4 MG PO TABS
4.0000 mg | ORAL_TABLET | Freq: Once | ORAL | Status: AC
  Administered 2019-04-01: 4 mg via ORAL
  Filled 2019-04-01: qty 1

## 2019-04-01 NOTE — Consult Note (Signed)
Cardiology Consultation:   Patient ID: [name] MRN: [id number]; DOB: 1926-05-26  Admit date: 03/31/2019 Date of Consult: 04/01/2019  Primary Care Provider: [name], MD Primary Cardiologist: new to [facility] Reason for consult: Dizziness, cardiac arrhythmia Physician requesting consult: Rufina Falco   Patient Profile:   [name] is a 85 y.o. female with a hx of hypertension, pulmonary embolism IVC filter on chronic warfarin, breast cancer, depression, presenting to the hospital with near syncope, dizziness, noted to have atrial arrhythmia in the emergency room  History of Present Illness:   [name] reports that she was recently started on blood pressure medication including HCTZ and amlodipine She feels that since the medications were started, she has not felt as well Having dizziness when she stands up Started having tachypalpitations approximately 3 weeks ago She seems to appreciate worsening dizziness near syncope when she stands up also in the setting of these tachypalpitations  In the emergency room placed on telemetry, noticed paroxysmal atrial tachycardia heart rates up to 130 bpm, repetitive episodes  Review of prior notes indicating she did not want to take IVC filter out in the past after discussion with vascular surgery.  Happy to remain on warfarin  At baseline reports that she is independent, has assistance from her son No recent falls  Heart Pathway Score:     Past Medical History:  Diagnosis Date  . Allergy   . Breast cancer ([place]) 07/22/2012   T1c, Nx carcinoma left breast, ER 90%, PR 90%, HER-2/neu not over expressed.  . Hypertension   . Insomnia   . Osteoporosis    Prolia 10/2012  . Pulmonary embolus ([place]) 2009  . S/P IVC filter 2009    Past Surgical History:  Procedure Laterality Date  . BLADDER SURGERY    . BREAST SURGERY Left 2013   mastectomy  . CHOLECYSTECTOMY    . HERNIA REPAIR    . MASTECTOMY Left 2013   BREAST CA   . REPLACEMENT TOTAL KNEE     right  . TONSILLECTOMY       Home Medications:  Prior to Admission medications   Medication Sig Start Date End Date Taking? Authorizing Provider  ALPRAZolam Duanne Moron) 0.5 MG tablet Take 0.5 tablets (0.25 mg total) by mouth at bedtime as needed for anxiety or sleep. 02/07/19  Yes [name] Haven, MD  amLODipine (NORVASC) 2.5 MG tablet Take 1 tablet (2.5 mg total) by mouth daily. 03/20/19  Yes [name] Haven, MD  Cholecalciferol (VITAMIN D PO) Take 2,000 mg by mouth daily.   Yes [provider]  denosumab (PROLIA) 60 MG/ML SOSY injection Prolia 60 mg/mL subcutaneous syringe  Inject 1 mL by subcutaneous route.   Yes [provider]  montelukast (SINGULAIR) 10 MG tablet Take 1 tablet (10 mg total) by mouth at bedtime. 03/20/19  Yes [name] Haven, MD  warfarin (COUMADIN) 1 MG tablet Take 1 mg by mouth on Tuesday and Saturday 02/07/19  Yes [name] Haven, MD  warfarin (COUMADIN) 3 MG tablet Take 1 tablet (3 mg total) by mouth daily. Patient taking differently: Take 3 mg by mouth daily. Monday, Wednesday, Friday 02/07/19  Yes [name] Haven, MD  albuterol (PROVENTIL HFA;VENTOLIN HFA) 108 (90 Base) MCG/ACT inhaler Inhale 2 puffs into the lungs every 6 (six) hours as needed for wheezing or shortness of breath. 09/01/18   [name] Green, FNP  baclofen (LIORESAL) 10 MG tablet Take 1 tablet (10 mg total) by mouth 3 (three) times daily as needed  for muscle spasms. Patient not taking: Reported on 03/31/2019 12/12/18   [name] Haven, MD  Biotin 10 MG TABS Take 10 mg by mouth daily.     [provider]  bisoprolol (ZEBETA) 5 MG tablet Take 1 tablet (5 mg total) by mouth daily. Patient not taking: Reported on 03/31/2019 01/04/19   [name] Haven, MD  diphenoxylate-atropine (LOMOTIL) 2.5-0.025 MG tablet Take 1 tablet by mouth 4 (four) times daily as needed for diarrhea or loose stools. 12/01/17   [name] Haven, MD  Fluocinolone Acetonide  0.01 % OIL  02/03/19   [provider]  fluticasone (FLONASE) 50 MCG/ACT nasal spray Place 2 sprays into both nostrils daily. 03/17/19   [name] Haven, MD  hydrochlorothiazide (HYDRODIURIL) 12.5 MG tablet Take 12.5 mg by mouth daily.  01/08/19   [provider]  letrozole (FEMARA) 2.5 MG tablet Take 2.5 mg by mouth daily.  11/04/18   [provider]  mirtazapine (REMERON) 7.5 MG tablet Take 1 tablet (7.5 mg total) by mouth at bedtime. Patient not taking: Reported on 03/31/2019 11/01/17   [name] Haven, MD  XIIDRA 5 % SOLN Place 1-2 drops into both eyes daily.  12/06/17   [provider]    Inpatient Medications: Scheduled Meds: . fluticasone  2 spray Each Nare Daily  . Lifitegrast  1-2 drop Both Eyes Daily  . metoprolol tartrate  25 mg Oral BID  . metoprolol tartrate  50 mg Oral BID  . montelukast  10 mg Oral QHS  . warfarin  4 mg Oral ONCE-1800  . Warfarin - Pharmacist Dosing Inpatient   Does not apply q1800   Continuous Infusions: . sodium chloride 75 mL/hr at 04/01/19 1150   PRN Meds: albuterol, ALPRAZolam, diphenoxylate-atropine  Allergies:    Allergies  Allergen Reactions  . Erythromycin   . Sulfa Drugs Cross Reactors     rash    Social History:   Social History   Socioeconomic History  . Marital status: Widowed    Spouse name: Not on file  . Number of children: Not on file  . Years of education: Not on file  . Highest education level: Not on file  Occupational History  . Not on file  Social Needs  . Financial resource strain: Not hard at all  . Food insecurity    Worry: Never true    Inability: Never true  . Transportation needs    Medical: No    Non-medical: No  Tobacco Use  . Smoking status: Former Smoker    Quit date: 05/13/1999    Years since quitting: 19.8  . Smokeless tobacco: Never Used  Substance and Sexual Activity  . Alcohol use: No  . Drug use: No  . Sexual activity: Not on file  Lifestyle  . Physical  activity    Days per week: 0 days    Minutes per session: Not on file  . Stress: Not at all  Relationships  . Social Herbalist on phone: Not on file    Gets together: Not on file    Attends religious service: Not on file    Active member of club or organization: Not on file    Attends meetings of clubs or organizations: Not on file    Relationship status: Not on file  . Intimate partner violence    Fear of current or ex partner: Not on file    Emotionally abused: Not on file    Physically abused:  Not on file    Forced sexual activity: Not on file  Other Topics Concern  . Not on file  Social History Narrative   Lives at [facility] at [place]. Has 2 sons. Widow.    Family History:    Family History  Problem Relation Age of Onset  . COPD Mother   . Stroke Father   . Cancer Father        breast  . Breast cancer Maternal Aunt      ROS:  Please see the history of present illness.  Review of Systems  Constitutional: Negative.   HENT: Negative.   Respiratory: Negative.   Cardiovascular: Positive for palpitations.  Gastrointestinal: Negative.   Musculoskeletal: Negative.   Neurological: Positive for dizziness.  Psychiatric/Behavioral: Negative.   All other systems reviewed and are negative.   Physical Exam/Data:   Vitals:   04/01/19 0340 04/01/19 0659 04/01/19 0847 04/01/19 1139  BP: 139/72 (!) 136/91 130/72 127/73  Pulse: 79  75 73  Resp: 20  16   Temp: 97.7 F (36.5 C)  98 F (36.7 C)   TempSrc: Oral  Oral   SpO2: 97%  95% 99%  Weight: 55.9 kg     Height:        Intake/Output Summary (Last 24 hours) at 04/01/2019 1441 Last data filed at 04/01/2019 0900 Gross per 24 hour  Intake 467.74 ml  Output 1100 ml  Net -632.26 ml   Last 3 Weights 04/01/2019 03/31/2019 03/31/2019  Weight (lbs) 123 lb 3.2 oz 122 lb 6.4 oz 123 lb 7.3 oz  Weight (kg) 55.883 kg 55.52 kg 56 kg     Body mass index is 23.28 kg/m.  General:  Well nourished, well developed, in no  acute distress HEENT: normal Lymph: no adenopathy Neck: no JVD Endocrine:  No thryomegaly Vascular: No carotid bruits; FA pulses 2+ bilaterally without bruits  Cardiac:  normal S1, S2; RRR; episodes of tachycardia appreciated,  no murmur  Lungs:  clear to auscultation bilaterally, no wheezing, rhonchi or rales  Abd: soft, nontender, no hepatomegaly  Ext: no edema Musculoskeletal:  No deformities, BUE and BLE strength normal and equal Skin: warm and dry  Neuro:  CNs 2-12 intact, no focal abnormalities noted Psych:  Normal affect   EKG:  The EKG was personally reviewed and demonstrates: Normal sinus rhythm rate 79 bpm no significant ST-T wave changes  Telemetry:  Telemetry was personally reviewed and demonstrates: Normal sinus rhythm with frequent episodes atrial tachycardia 130 bpm  Relevant CV Studies: Echocardiogram pending  Laboratory Data:  High Sensitivity Troponin:   Recent Labs  Lab 03/31/19 1646 03/31/19 1823  TROPONINIHS 6 6     Chemistry Recent Labs  Lab 03/31/19 1304  NA 141  K 3.9  CL 108  CO2 23  GLUCOSE 102*  BUN 15  CREATININE 1.00  CALCIUM 9.0  GFRNONAA 49*  GFRAA 57*  ANIONGAP 10    No results for input(s): PROT, ALBUMIN, AST, ALT, ALKPHOS, BILITOT in the last 168 hours. Hematology Recent Labs  Lab 03/31/19 1304  WBC 6.7  RBC 4.67  HGB 13.7  HCT 41.8  MCV 89.5  MCH 29.3  MCHC 32.8  RDW 13.5  PLT 235   BNPNo results for input(s): BNP, PROBNP in the last 168 hours.  DDimer No results for input(s): DDIMER in the last 168 hours.   Radiology/Studies:  Ct Head Wo Contrast  Result Date: 03/31/2019 CLINICAL DATA:  Dizziness and near syncopal episodes after starting blood  pressure medication recently. EXAM: CT HEAD WITHOUT CONTRAST TECHNIQUE: Contiguous axial images were obtained from the base of the skull through the vertex without intravenous contrast. COMPARISON:  03/19/2011. Brain MR dated 03/06/2019 and 03/19/2011. FINDINGS: Brain:  Mild-to-moderate enlargement of the ventricles and subarachnoid spaces. Moderate patchy white matter low density in both cerebral hemispheres. No intracranial hemorrhage, mass lesion or CT evidence of acute infarction. Vascular: No hyperdense vessel or unexpected calcification. Skull: Mild bilateral hyperostosis frontalis. Sinuses/Orbits: Status post bilateral cataract extraction. Left maxillary sinus air-fluid level. Right sphenoid sinus air-fluid level without significant change since 03/06/2019 and on 03/19/2011. Other: None. IMPRESSION: 1. No acute abnormality. 2. Mild to moderate diffuse cerebral and cerebellar atrophy. 3. Moderate chronic small vessel white matter ischemic changes in both cerebral hemispheres. 4. Stable left maxillary and right sphenoid sinus chronic effusions. Electronically Signed   By: Claudie Revering M.D.   On: 03/31/2019 16:41    Assessment and Plan:   1.  Dizziness/near syncope Does not appear to be orthostatic by emergency room numbers Symptoms possibly secondary to tachyarrhythmia Frequent episodes what appears to be atrial tachycardia heart rate sometimes higher than 130 bpm - she is symptomatic and does appreciate the palpitations She correlates these palpitations with her dizziness -We will hold the amlodipine and HCTZ Start metoprolol 50 mg twice daily She was given dose of 25 mg approximately 2 hours prior to my evaluation and continued to have arrhythmia with stable blood pressure -Recommended watching her overnight on metoprolol 50 twice daily -Echocardiogram pending  2.  Cerebrovascular disease Seen on CT scan, chronic small vessel white matter ischemic changes also with moderate diffuse cerebellar and cerebral atrophy She is on warfarin.  No indication for aspirin  3) atrial tachycardia, paroxysmal We will treat with metoprolol tartrate 50 mg twice daily as above Echocardiogram pending TSH normal Normal electrolytes  Long discussion with patient and son  at the bedside All questions answered Case discussed with hospitalist service  Total encounter time more than 110 minutes  Greater than 50% was spent in counseling and coordination of care with the patient   For questions or updates, please contact [place] Please consult [email or web address] for contact info under     Signed, Ida Rogue, MD  04/01/2019 2:41 PM

## 2019-04-01 NOTE — Progress Notes (Signed)
Notify Dr. Jannifer Franklin about patient's restlessness, impulsiveness and confusion, gave home dose xanax but still restless, asked if she can PRN sleeping aid, Seroquel PRN order given. RN will continue to monitor.

## 2019-04-01 NOTE — Progress Notes (Signed)
Notify Dr. Rockey Situ about patient's scheduled Metoprolol 25 mg tonight plus the metoprolol 50 mg. Order to discontinue metoprolol 25 mg and to only give metoprolol 50 mg tonight. RN will continue to monitor.

## 2019-04-01 NOTE — Progress Notes (Signed)
[facility] [place] NAME: Chavonne Chavana    MR#:  [id number]  DATE OF BIRTH:  [birth year]  SUBJECTIVE:   Patient states she is feeling fine today and would like to go home.  She denies any chest pain.  She does endorse "feeling like a bird is fluttering in her chest" at home, but has not had any episodes like this since being in the hospital.  REVIEW OF SYSTEMS:  Review of Systems  Constitutional: Negative for chills and fever.  HENT: Negative for congestion and sore throat.   Eyes: Negative for blurred vision and double vision.  Respiratory: Negative for cough and shortness of breath.   Cardiovascular: Positive for palpitations. Negative for chest pain.  Gastrointestinal: Negative for nausea and vomiting.  Genitourinary: Negative for dysuria and urgency.  Musculoskeletal: Negative for back pain and neck pain.  Neurological: Negative for dizziness and headaches.  Psychiatric/Behavioral: Negative for depression. The patient is not nervous/anxious.     DRUG ALLERGIES:   Allergies  Allergen Reactions  . Erythromycin   . Sulfa Drugs Cross Reactors     rash   VITALS:  Blood pressure 127/73, pulse 73, temperature 98 F (36.7 C), temperature source Oral, resp. rate 16, height 5\' 1"  (1.549 m), weight 55.9 kg, SpO2 99 %. PHYSICAL EXAMINATION:  Physical Exam  GENERAL:  Laying in the bed with no acute distress.  HEENT: Head atraumatic, normocephalic. Pupils equal, round, reactive to light and accommodation. No scleral icterus. Extraocular muscles intact. Oropharynx and nasopharynx clear.  NECK:  Supple, no jugular venous distention. No thyroid enlargement. LUNGS: Lungs are clear to auscultation bilaterally. No wheezes, crackles, rhonchi. No use of accessory muscles of respiration.  CARDIOVASCULAR: Tachycardic, regular rhythm, S1, S2 normal. No murmurs, rubs, or gallops.  ABDOMEN: Soft, nontender, nondistended. Bowel sounds present.   EXTREMITIES: No pedal edema, cyanosis, or clubbing.  NEUROLOGIC: CN 2-12 intact, no focal deficits. 5/5 muscle strength throughout all extremities. Sensation intact throughout. Gait not checked.  PSYCHIATRIC: The patient is alert and oriented x 3.  SKIN: No obvious rash, lesion, or ulcer.  LABORATORY PANEL:  Female CBC Recent Labs  Lab 03/31/19 1304  WBC 6.7  HGB 13.7  HCT 41.8  PLT 235   ------------------------------------------------------------------------------------------------------------------ Chemistries  Recent Labs  Lab 03/31/19 1304  NA 141  K 3.9  CL 108  CO2 23  GLUCOSE 102*  BUN 15  CREATININE 1.00  CALCIUM 9.0   RADIOLOGY:  Ct Head Wo Contrast  Result Date: 03/31/2019 CLINICAL DATA:  Dizziness and near syncopal episodes after starting blood pressure medication recently. EXAM: CT HEAD WITHOUT CONTRAST TECHNIQUE: Contiguous axial images were obtained from the base of the skull through the vertex without intravenous contrast. COMPARISON:  03/19/2011. Brain MR dated 03/06/2019 and 03/19/2011. FINDINGS: Brain: Mild-to-moderate enlargement of the ventricles and subarachnoid spaces. Moderate patchy white matter low density in both cerebral hemispheres. No intracranial hemorrhage, mass lesion or CT evidence of acute infarction. Vascular: No hyperdense vessel or unexpected calcification. Skull: Mild bilateral hyperostosis frontalis. Sinuses/Orbits: Status post bilateral cataract extraction. Left maxillary sinus air-fluid level. Right sphenoid sinus air-fluid level without significant change since 03/06/2019 and on 03/19/2011. Other: None. IMPRESSION: 1. No acute abnormality. 2. Mild to moderate diffuse cerebral and cerebellar atrophy. 3. Moderate chronic small vessel white matter ischemic changes in both cerebral hemispheres. 4. Stable left maxillary and right sphenoid sinus chronic effusions. Electronically Signed   By: Claudie Revering M.D.   On: 03/31/2019  16:41   ASSESSMENT AND  PLAN:   Paroxysmal atrial tachycardia- patient continuing to have episodes of tachycardia with heart rates up to the 140s. -Increase metoprolol to 50 mg twice daily -Cardiology following -ECHO pending -Cardiac monitoring  Dizziness -likely due to above -Orthostatics were negative -CT head unremarkable -ECHO as above -PT consult  History of pulmonary embolism s/p IVC filter- subtherapeutic on admission -Coumadin per pharmacy -Daily INR  HTN - BP has been well controlled -Stop home Norvasc and HCTZ -Metoprolol 50 mg twice daily  Asymptomatic bacteriuria -Urine culture was ordered on admission -No antibiotics for now  Depression and Anxiety -Continue Remeron  -May need to consider as an outpatient, given her age and presentation of dizziness  DVT prophylaxis- Coumadin  Son was at bedside and was updated.  All the records are reviewed and case discussed with Care Management/Social Worker. Management plans discussed with the patient, family and they are in agreement.  CODE STATUS: Full Code  TOTAL TIME TAKING CARE OF THIS PATIENT: 45 minutes.   More than 50% of the time was spent in counseling/coordination of care: YES  POSSIBLE D/C tomorrow, DEPENDING ON CLINICAL CONDITION.   Berna Spare Yuriy Cui M.D on 04/01/2019 at 3:23 PM  Between 7am to 6pm - Pager [phone]  After 6pm go to [email or web address] - Technical brewer [facility]  Office  [phone]  CC: Primary care physician; [name], MD  Note: This dictation was prepared with Dragon dictation along with smaller phrase technology. Any transcriptional errors that result from this process are unintentional.

## 2019-04-01 NOTE — Consult Note (Signed)
ANTICOAGULATION CONSULT NOTE - Follow Up Consult  Pharmacy Consult for Warfarin Dosing  Indication: VTE Treatment  Allergies  Allergen Reactions  . Erythromycin   . Sulfa Drugs Cross Reactors     rash    Patient Measurements: Height: 5\' 1"  (154.9 cm) Weight: 123 lb 3.2 oz (55.9 kg) IBW/kg (Calculated) : 47.8   Vital Signs: Temp: 97.7 F (36.5 C) (09/05 0340) Temp Source: Oral (09/05 0340) BP: 136/91 (09/05 0659) Pulse Rate: 79 (09/05 0340)  Labs: Recent Labs    03/31/19 1304 03/31/19 1646 03/31/19 1823 04/01/19 0419  HGB 13.7  --   --   --   HCT 41.8  --   --   --   PLT 235  --   --   --   LABPROT  --  15.1  --  17.6*  INR  --  1.2  --  1.5*  CREATININE 1.00  --   --   --   TROPONINIHS  --  6 6  --     Estimated Creatinine Clearance: 27.7 mL/min (by C-G formula based on SCr of 1 mg/dL).   Assessment: Pharmacy consulted for warfarin dosing and monitoring in 85 yo female with PMH of PE (S/P IVC filer). INR subtherapeutic on admission.   Home Regimen: Warfarin 1mg  Tues, Sat                               Warfarin 3mg  Mon, Wed, Fri Date INR Warfarin Dose  9/4 1.2 4 mg  9/5 1.5 4 mg     Goal of Therapy:  INR 2-3 Monitor platelets by anticoagulation protocol: Yes   Plan:  INR subtherapeutic. Will order Warfarin 4mg  po x 1 dose tonight.   INR ordered with AM labs.   Pharmacy will continue to order doses based on levels. CBC at least every 3 days per protocol.   Oswald Hillock, PharmD, BCPS Clinical Pharmacist 04/01/2019 7:40 AM

## 2019-04-02 ENCOUNTER — Inpatient Hospital Stay (HOSPITAL_COMMUNITY)
Admit: 2019-04-02 | Discharge: 2019-04-02 | Disposition: A | Discharge status: Home / Self Care | Payer: Medicare Other | Attending: Cardiovascular Disease | Admitting: Cardiovascular Disease

## 2019-04-02 DIAGNOSIS — I361 Nonrheumatic tricuspid (valve) insufficiency: Secondary | ICD-10-CM

## 2019-04-02 DIAGNOSIS — F05 Delirium due to known physiological condition: Secondary | ICD-10-CM

## 2019-04-02 DIAGNOSIS — I340 Nonrheumatic mitral (valve) insufficiency: Secondary | ICD-10-CM

## 2019-04-02 DIAGNOSIS — F0391 Unspecified dementia with behavioral disturbance: Secondary | ICD-10-CM

## 2019-04-02 DIAGNOSIS — I471 Supraventricular tachycardia: Secondary | ICD-10-CM

## 2019-04-02 LAB — CBC
HCT: 42.4 % (ref 36.0–46.0)
Hemoglobin: 13.8 g/dL (ref 12.0–15.0)
MCH: 29.3 pg (ref 26.0–34.0)
MCHC: 32.5 g/dL (ref 30.0–36.0)
MCV: 90 fL (ref 80.0–100.0)
Platelets: 219 10*3/uL (ref 150–400)
RBC: 4.71 MIL/uL (ref 3.87–5.11)
RDW: 13.3 % (ref 11.5–15.5)
WBC: 6.1 10*3/uL (ref 4.0–10.5)
nRBC: 0 % (ref 0.0–0.2)

## 2019-04-02 LAB — URINE CULTURE: Culture: <10000 — AB

## 2019-04-02 LAB — BASIC METABOLIC PANEL
Anion gap: 9 (ref 5–15)
BUN: 12 mg/dL (ref 8–23)
CO2: 22 mmol/L (ref 22–32)
Calcium: 8.6 mg/dL — ABNORMAL LOW (ref 8.9–10.3)
Chloride: 112 mmol/L — ABNORMAL HIGH (ref 98–111)
Creatinine, Ser: 0.9 mg/dL (ref 0.44–1.00)
GFR calc Af Amer: >60 mL/min (ref >60)
GFR calc non Af Amer: 56 mL/min — ABNORMAL LOW (ref >60)
Glucose, Bld: 93 mg/dL (ref 70–99)
Potassium: 3.7 mmol/L (ref 3.5–5.1)
Sodium: 143 mmol/L (ref 135–145)

## 2019-04-02 LAB — PROTIME-INR
INR: 1.8 — ABNORMAL HIGH (ref 0.8–1.2)
Prothrombin Time: 20.6 seconds — ABNORMAL HIGH (ref 11.4–15.2)

## 2019-04-02 LAB — ECHOCARDIOGRAM COMPLETE
Height: 61 in
Weight: 1963.2 oz

## 2019-04-02 MED ORDER — WARFARIN SODIUM 2 MG PO TABS
2.0000 mg | ORAL_TABLET | Freq: Once | ORAL | Status: DC
  Filled 2019-04-02 (×2): qty 1

## 2019-04-02 MED ORDER — METOPROLOL TARTRATE 50 MG PO TABS: 50.0000 mg | ORAL_TABLET | Freq: Two times a day (BID) | ORAL | 0 refills | Status: DC

## 2019-04-02 NOTE — TOC Transition Note (Signed)
Transition of Care [facility]) - CM/SW Discharge Note   Patient Details  Name: [name] MRN: [id number] Date of Birth: January 03, 1926  [facility] CM/SW Contact:  Latanya Maudlin, RN Phone Number: 04/02/2019, 1:25 PM   Clinical Narrative:  Patient to be discharged per MD order. Orders in place for home health services. Patient is an [place] living resident at [facility]. She has used therapy services through them before and wishes to again. I have faxed Lorelee Cover, the referral info     Final next level of care: [place] Barriers to Discharge: No Barriers Identified   Patient Goals and CMS Choice        Discharge Placement                       Discharge Plan and Services                                     Social Determinants of Health (SDOH) Interventions     Readmission Risk Interventions No flowsheet data found.

## 2019-04-02 NOTE — Progress Notes (Signed)
*  PRELIMINARY RESULTS* Echocardiogram 2D Echocardiogram has been performed.  [name] 04/02/2019, 3:38 PM

## 2019-04-02 NOTE — Progress Notes (Signed)
Patient tele rang out that leads were off. Upon RN and NA entering room patient was awake and moving to side of bed while attempting to pull her IV out. She had already removed her leads. Patient looking around as if unfamiliar with surrounds. Attempted to reorient patient. She was very agitated and non receptive to reorientation attempts. Patient was offered assistance to the bathroom and she refused. After a few minutes of observing patient she just stood up and tried to walk towards the bathroom. Patient was assisted but began pulling away even after she was told we were trying to help her. She was verbally aggressive and refusing help. We continued to observe and help as she would allow until she was safely in the bathroom and sitting on the commode. Patient was still arguing and unable to be reoriented. Dr. Jannifer Franklin notified of patient agitation. Once patient came out of bathroom she stood at the bedside and was unwilling to get back in the bed. After about 10 mins we were able to convince her to get back in the bed. She was lying in bed with her eyes closed when we left. Bed exit alarm engaged.

## 2019-04-02 NOTE — Progress Notes (Signed)
Physical Therapy Evaluation Patient Details Name: [name] MRN: [id number] DOB: July 29, 1925 Today's Date: 04/02/2019   History of Present Illness  85 year old with history of pulmonary embolism status post IVC filter on Coumadin, hypertension, breast cancer, depression, osteoporosis on Prolia and insomnia presenting to the ED with chief complaints of dizziness and near syncope.  Clinical Impression  Patient is 85 y.o. female alert and orientated to person, place and time,  with reports of intermittent instability during ambulation, especially outdoors.She has fair strength BLE.  She is MI for bed mobility, transfers and gait 300 feet with RW. She lives at assisted living. She would benefit from skilled PT to be instructed in gait with rollator for instruction in safety.     Follow Up Recommendations Home health PT    Equipment Recommendations  (rollator)    Recommendations for Other Services       Precautions / Restrictions Precautions Precautions: (Patient reports that sometimes she is unsteady) Restrictions Weight Bearing Restrictions: No      Mobility  Bed Mobility Overal bed mobility: Independent                Transfers Overall transfer level: Modified independent Equipment used: Rolling walker (2 wheeled)             General transfer comment: VC for safety  Ambulation/Gait Ambulation/Gait assistance: Modified independent (Device/Increase time) Gait Distance (Feet): 300 Feet Assistive device: Rolling walker (2 wheeled)          Stairs            Wheelchair Mobility    Modified Rankin (Stroke Patients Only)       Balance Overall balance assessment: Mild deficits observed, not formally tested                                           Pertinent Vitals/Pain Pain Assessment: No/denies pain    Home Living Family/patient expects to be discharged to:: Assisted living Living Arrangements: Alone             Home  Equipment: None Additional Comments: She needs a rollator     Prior Function Level of Independence: Independent         Comments: (She reports some intermittent unsteadiness at home)     Hand Dominance        Extremity/Trunk Assessment   Upper Extremity Assessment Upper Extremity Assessment: Overall WFL for tasks assessed    Lower Extremity Assessment Lower Extremity Assessment: Overall WFL for tasks assessed       Communication   Communication: No difficulties  Cognition Arousal/Alertness: Awake/alert Behavior During Therapy: WFL for tasks assessed/performed Overall Cognitive Status: Within Functional Limits for tasks assessed                                        General Comments      Exercises     Assessment/Plan    PT Assessment Patient needs continued PT services  PT Problem List Decreased strength;Decreased knowledge of use of DME;Decreased safety awareness       PT Treatment Interventions Gait training;Therapeutic activities;Therapeutic exercise;Balance training    PT Goals (Current goals can be found in the Care Plan section)  Acute Rehab PT Goals Patient Stated Goal: to go home PT Goal Formulation: With patient Time  For Goal Achievement: 04/16/19 Potential to Achieve Goals: Good    Frequency Min 2X/week   Barriers to discharge        Co-evaluation               AM-PAC PT "6 Clicks" Mobility  Outcome Measure Help needed turning from your back to your side while in a flat bed without using bedrails?: None Help needed moving from lying on your back to sitting on the side of a flat bed without using bedrails?: None Help needed moving to and from a bed to a chair (including a wheelchair)?: A Little Help needed standing up from a chair using your arms (e.g., wheelchair or bedside chair)?: A Little Help needed to walk in hospital room?: A Little Help needed climbing 3-5 steps with a railing? : A Little 6 Click Score:  20    End of Session Equipment Utilized During Treatment: Gait belt Activity Tolerance: Patient tolerated treatment well Patient left: in chair;with call bell/phone within reach;with chair alarm set   PT Visit Diagnosis: Unsteadiness on feet (R26.81);Other abnormalities of gait and mobility (R26.89);Muscle weakness (generalized) (M62.81);Difficulty in walking, not elsewhere classified (R26.2)    Time: 0930-1000 PT Time Calculation (min) (ACUTE ONLY): 30 min   Charges:   PT Evaluation $PT Eval Low Complexity: 1 Low PT Treatments $Gait Training: 8-22 mins          Alanson Puls, PT DPT 04/02/2019, 10:37 AM

## 2019-04-02 NOTE — Discharge Instructions (Signed)
Dizziness Dizziness is a common problem. It makes you feel unsteady or light-headed. You may feel like you are about to pass out (faint). Dizziness can lead to getting hurt if you stumble or fall. Dizziness can be caused by many things, including:  Medicines.  Not having enough water in your body (dehydration).  Illness. Follow these instructions at home: Eating and drinking   Drink enough fluid to keep your pee (urine) clear or pale yellow. This helps to keep you from getting dehydrated. Try to drink more clear fluids, such as water.  Do not drink alcohol.  Limit how much caffeine you drink or eat, if your doctor tells you to do that.  Limit how much salt (sodium) you drink or eat, if your doctor tells you to do that. Activity   Avoid making quick movements. ? When you stand up from sitting in a chair, steady yourself until you feel okay. ? In the morning, first sit up on the side of the bed. When you feel okay, stand slowly while you hold onto something. Do this until you know that your balance is fine.  If you need to stand in one place for a long time, move your legs often. Tighten and relax the muscles in your legs while you are standing.  Do not drive or use heavy machinery if you feel dizzy.  Avoid bending down if you feel dizzy. Place items in your home so you can reach them easily without leaning over. Lifestyle  Do not use any products that contain nicotine or tobacco, such as cigarettes and e-cigarettes. If you need help quitting, ask your doctor.  Try to lower your stress level. You can do this by using methods such as yoga or meditation. Talk with your doctor if you need help. General instructions  Watch your dizziness for any changes.  Take over-the-counter and prescription medicines only as told by your doctor. Talk with your doctor if you think that you are dizzy because of a medicine that you are taking.  Tell a friend or a family member that you are  feeling dizzy. If he or she notices any changes in your behavior, have this person call your doctor.  Keep all follow-up visits as told by your doctor. This is important. Contact a doctor if:  Your dizziness does not go away.  Your dizziness or light-headedness gets worse.  You feel sick to your stomach (nauseous).  You have trouble hearing.  You have new symptoms.  You are unsteady on your feet.  You feel like the room is spinning. Get help right away if:  You throw up (vomit) or have watery poop (diarrhea), and you cannot eat or drink anything.  You have trouble: ? Talking. ? Walking. ? Swallowing. ? Using your arms, hands, or legs.  You feel generally weak.  You are not thinking clearly, or you have trouble forming sentences. A friend or family member may notice this.  You have: ? Chest pain. ? Pain in your belly (abdomen). ? Shortness of breath. ? Sweating.  Your vision changes.  You are bleeding.  You have a very bad headache.  You have neck pain or a stiff neck.  You have a fever. These symptoms may be an emergency. Do not wait to see if the symptoms will go away. Get medical help right away. Call your local emergency services (911 in the [place] Do not drive yourself to the hospital. Summary  Dizziness makes you feel unsteady or light-headed.  You may feel like you are about to pass out (faint).  Drink enough fluid to keep your pee (urine) clear or pale yellow. Do not drink alcohol.  Avoid making quick movements if you feel dizzy.  Watch your dizziness for any changes. This information is not intended to replace advice given to you by your health care provider. Make sure you discuss any questions you have with your health care provider. Document Released: 07/02/2011 Document Revised: 07/16/2017 Document Reviewed: 07/30/2016 Elsevier Patient Education  [employer]. It was so nice to meet you during this hospitalization!  You came into the  hospital with palpitations and feeling like you were going to faint. We found that your heart was beating very fast. We have started you on a medication called Metoprolol to decrease your heart rate. Please take Metoprolol 50mg  twice a day. You should STOP the amlodipine and the hydrochlorothiazide.   Take care, Dr. Brett Albino

## 2019-04-02 NOTE — Consult Note (Signed)
ANTICOAGULATION CONSULT NOTE - Follow Up Consult  Pharmacy Consult for Warfarin Dosing  Indication: VTE Treatment  Allergies  Allergen Reactions  . Erythromycin   . Sulfa Drugs Cross Reactors     rash    Patient Measurements: Height: 5\' 1"  (154.9 cm) Weight: 122 lb 11.2 oz (55.7 kg) IBW/kg (Calculated) : 47.8   Vital Signs: Temp: 97.7 F (36.5 C) (09/06 0824) Temp Source: Oral (09/06 0824) BP: 163/90 (09/06 0824) Pulse Rate: 83 (09/06 0824)  Labs: Recent Labs    03/31/19 1304 03/31/19 1646 03/31/19 1823 04/01/19 0419 04/02/19 0737 04/02/19 0947  HGB 13.7  --   --   --  13.8  --   HCT 41.8  --   --   --  42.4  --   PLT 235  --   --   --  219  --   LABPROT  --  15.1  --  17.6*  --  20.6*  INR  --  1.2  --  1.5*  --  1.8*  CREATININE 1.00  --   --   --  0.90  --   TROPONINIHS  --  6 6  --   --   --     Estimated Creatinine Clearance: 30.7 mL/min (by C-G formula based on SCr of 0.9 mg/dL).   Assessment: Pharmacy consulted for warfarin dosing and monitoring in 85 yo female with PMH of PE (S/P IVC filer). INR subtherapeutic on admission.   Home Regimen: Warfarin 1mg  Tues, Sat                               Warfarin 3mg  Mon, Wed, Fri Date INR Warfarin Dose  9/4 1.2 4 mg  9/5 1.5 4 mg  9/6 1.8 2 mg     Goal of Therapy:  INR 2-3 Monitor platelets by anticoagulation protocol: Yes   Plan:  INR subtherapeutic. Will order Warfarin 2 mg po x 1 dose tonight.   INR ordered with AM labs.   Pharmacy will continue to order doses based on levels. CBC at least every 3 days per protocol.   Oswald Hillock, PharmD, BCPS Clinical Pharmacist 04/02/2019 11:05 AM

## 2019-04-02 NOTE — Progress Notes (Signed)
Progress Note  Patient Name: [name] Date of Encounter: 04/02/2019  Primary Cardiologist: New to [facility]  Subjective   Agitation overnight, sundowning Required Seroquel Better this morning, she reports that she did not realize her son was with her " the past 2 days in the hospital" Denies any dizziness, near syncope, tachycardia Tolerated metoprolol 50 twice daily, other blood pressure medications held Improvement in her arrhythmia on telemetry, brief episodes of narrow complex tachycardia consistent with atrial tachycardia, short runs  Inpatient Medications    Scheduled Meds: . fluticasone  2 spray Each Nare Daily  . Lifitegrast  1-2 drop Both Eyes Daily  . metoprolol tartrate  50 mg Oral BID  . montelukast  10 mg Oral QHS  . warfarin  2 mg Oral ONCE-1800  . Warfarin - Pharmacist Dosing Inpatient   Does not apply q1800   Continuous Infusions: . sodium chloride 75 mL/hr at 04/01/19 1150   PRN Meds: albuterol, ALPRAZolam, diphenoxylate-atropine, QUEtiapine   Vital Signs    Vitals:   04/02/19 0101 04/02/19 0345 04/02/19 0733 04/02/19 0824  BP:  (!) 138/94 (!) 144/82 (!) 163/90  Pulse:  66 77 83  Resp:  18 18 16   Temp:  98.2 F (36.8 C) 97.6 F (36.4 C) 97.7 F (36.5 C)  TempSrc:  Oral Oral Oral  SpO2:  93% 98% 100%  Weight: 55.7 kg     Height:        Intake/Output Summary (Last 24 hours) at 04/02/2019 1501 Last data filed at 04/02/2019 1300 Gross per 24 hour  Intake 240 ml  Output 2400 ml  Net -2160 ml   Last 3 Weights 04/02/2019 04/01/2019 03/31/2019  Weight (lbs) 122 lb 11.2 oz 123 lb 3.2 oz 122 lb 6.4 oz  Weight (kg) 55.656 kg 55.883 kg 55.52 kg      Telemetry    Normal sinus rhythm with short runs of atrial tachycardia- Personally Reviewed  ECG     - Personally Reviewed  Physical Exam   GEN: No acute distress.   Neck: No JVD Cardiac: RRR, no murmurs, rubs, or gallops.  Respiratory: Clear to auscultation bilaterally. GI: Soft, nontender,  non-distended  MS: No edema; No deformity. Neuro:  Nonfocal  Psych: Normal affect   Labs    High Sensitivity Troponin:   Recent Labs  Lab 03/31/19 1646 03/31/19 1823  TROPONINIHS 6 6      Chemistry Recent Labs  Lab 03/31/19 1304 04/02/19 0737  NA 141 143  K 3.9 3.7  CL 108 112*  CO2 23 22  GLUCOSE 102* 93  BUN 15 12  CREATININE 1.00 0.90  CALCIUM 9.0 8.6*  GFRNONAA 49* 56*  GFRAA 57* >60  ANIONGAP 10 9     Hematology Recent Labs  Lab 03/31/19 1304 04/02/19 0737  WBC 6.7 6.1  RBC 4.67 4.71  HGB 13.7 13.8  HCT 41.8 42.4  MCV 89.5 90.0  MCH 29.3 29.3  MCHC 32.8 32.5  RDW 13.5 13.3  PLT 235 219    BNPNo results for input(s): BNP, PROBNP in the last 168 hours.   DDimer No results for input(s): DDIMER in the last 168 hours.   Radiology    Ct Head Wo Contrast  Result Date: 03/31/2019 CLINICAL DATA:  Dizziness and near syncopal episodes after starting blood pressure medication recently. EXAM: CT HEAD WITHOUT CONTRAST TECHNIQUE: Contiguous axial images were obtained from the base of the skull through the vertex without intravenous contrast. COMPARISON:  03/19/2011. Brain MR dated 03/06/2019  and 03/19/2011. FINDINGS: Brain: Mild-to-moderate enlargement of the ventricles and subarachnoid spaces. Moderate patchy white matter low density in both cerebral hemispheres. No intracranial hemorrhage, mass lesion or CT evidence of acute infarction. Vascular: No hyperdense vessel or unexpected calcification. Skull: Mild bilateral hyperostosis frontalis. Sinuses/Orbits: Status post bilateral cataract extraction. Left maxillary sinus air-fluid level. Right sphenoid sinus air-fluid level without significant change since 03/06/2019 and on 03/19/2011. Other: None. IMPRESSION: 1. No acute abnormality. 2. Mild to moderate diffuse cerebral and cerebellar atrophy. 3. Moderate chronic small vessel white matter ischemic changes in both cerebral hemispheres. 4. Stable left maxillary and right  sphenoid sinus chronic effusions. Electronically Signed   By: Claudie Revering M.D.   On: 03/31/2019 16:41    Cardiac Studies   Echocardiogram pending  Patient Profile     [name] is a 85 y.o. female with a hx of hypertension, pulmonary embolism IVC filter on chronic warfarin, breast cancer, depression, presenting to the hospital with near syncope, dizziness, noted to have atrial arrhythmia in the emergency room   Assessment & Plan   1.  Dizziness/near syncope No evidence of orthostasis Symptoms possibly secondary to tachyarrhythmia, atrial tachycardia Rates were documented up into the 140 range and higher Would continue to hold amlodipine and HCTZ Continue metoprolol 50 mg twice daily -Echocardiogram pending.  If not performed today this could be done as an outpatient  2.  Cerebrovascular disease Seen on CT scan, chronic small vessel white matter ischemic changes also with moderate diffuse cerebellar and cerebral atrophy She is on warfarin.  No indication for aspirin She had sundowning, agitation confusion overnight requiring Seroquel -Long discussion with patient's son, he is very concerned Recommend follow-up with primary care Suspect that she should improve back home but if symptoms persist may need further management.  Not unexpected after sleep deprivation in the hospital in a patient her age  7) atrial tachycardia, paroxysmal Would continue metoprolol tartrate 50 mg twice daily as above Echocardiogram pending TSH normal Normal electrolytes  Long discussion with patient patient son, discussed with hospitalist service.  Discussed memory issues, dementia agitation with patient's son in detail  Total encounter time more than 35 minutes  Greater than 50% was spent in counseling and coordination of care with the patient   For questions or updates, please contact [place] Please consult [email or web address] for contact info under        Signed, Ida Rogue, MD   04/02/2019, 3:01 PM

## 2019-04-02 NOTE — Plan of Care (Signed)
HR is NSR at 73.  Pt irritable and impulsive, but became calm and cooperative once her requests were met and educated regarding the reason for her hospitalization.   Problem: Education: Goal: Knowledge of General Education information will improve Description: Including pain rating scale, medication(s)/side effects and non-pharmacologic comfort measures Outcome: Progressing   Problem: Health Behavior/Discharge Planning: Goal: Ability to manage health-related needs will improve Outcome: Progressing   Problem: Clinical Measurements: Goal: Ability to maintain clinical measurements within normal limits will improve Outcome: Progressing Goal: Will remain free from infection Outcome: Progressing Goal: Diagnostic test results will improve Outcome: Progressing Goal: Respiratory complications will improve Outcome: Progressing Goal: Cardiovascular complication will be avoided Outcome: Progressing   Problem: Activity: Goal: Risk for activity intolerance will decrease Outcome: Progressing   Problem: Nutrition: Goal: Adequate nutrition will be maintained Outcome: Progressing   Problem: Coping: Goal: Level of anxiety will decrease Outcome: Progressing   Problem: Elimination: Goal: Will not experience complications related to bowel motility Outcome: Progressing Goal: Will not experience complications related to urinary retention Outcome: Progressing   Problem: Pain Managment: Goal: General experience of comfort will improve Outcome: Progressing   Problem: Safety: Goal: Ability to remain free from injury will improve Outcome: Progressing   Problem: Skin Integrity: Goal: Risk for impaired skin integrity will decrease Outcome: Progressing   Problem: Education: Goal: Knowledge of disease or condition will improve Outcome: Progressing Goal: Understanding of medication regimen will improve Outcome: Progressing Goal: Individualized Educational Video(s) Outcome: Progressing    Problem: Activity: Goal: Ability to tolerate increased activity will improve Outcome: Progressing   Problem: Cardiac: Goal: Ability to achieve and maintain adequate cardiopulmonary perfusion will improve Outcome: Progressing   Problem: Health Behavior/Discharge Planning: Goal: Ability to safely manage health-related needs after discharge will improve Outcome: Progressing

## 2019-04-02 NOTE — Discharge Summary (Signed)
[place] [facility] [place] NAME: [name]    MR#:  [id number]  DATE OF BIRTH:  1925-09-19  DATE OF ADMISSION:  03/31/2019   ADMITTING PHYSICIAN: Lang Snow, NP  DATE OF DISCHARGE: 04/02/19  PRIMARY CARE PHYSICIAN: [name], MD   ADMISSION DIAGNOSIS:  [name] [I49.8] [name] [R42] [name] [R42] DISCHARGE DIAGNOSIS:  Active Problems:   Postural [name] with near syncope  SECONDARY DIAGNOSIS:   Past Medical History:  Diagnosis Date  . Allergy   . Breast cancer ([place]) 07/22/2012   T1c, Nx carcinoma left breast, ER 90%, PR 90%, HER-2/neu not over expressed.  . Hypertension   . Insomnia   . Osteoporosis    Prolia 10/2012  . Pulmonary embolus ([place]) 2009  . S/P IVC filter 2009   HOSPITAL COURSE:   [name] is a 85 year old female who presented to the ED with [name], palpitations, and near syncope.  In the ED, she was noted to have paroxysmal [name] tachycardia.  She was admitted for further management.  Paroxysmal [name] tachycardia- HR has greatly improved over the last 24 hours. -Started on metoprolol to 50 mg twice daily -ECHO was performed prior to discharge, but the read was still pending at discharge -Patient will follow-up with cardiology in the next 1-2 weeks  [name] -likely due to above -Orthostatics were negative -CT head unremarkable -ECHO as above -PT recommended home health PT  History of pulmonary embolism s/p IVC filter- subtherapeutic on admission -Home Coumadin was continued on discharge  HTN - BP has been well controlled -Home Norvasc and HCTZ were discontinued and metoprolol 50 mg twice daily was started -Patient will need continued tight BP control at home due to her history of cerebral microhemorrhages  Asymptomatic bacteriuria -Urine culture with <10,000 CFU of significant growth  Depression and Anxiety -Continued Remeron -May need to consider stopping  xanax as an outpatient, given her age and presentation of [name]  DISCHARGE CONDITIONS:  Paroxysmal [name] tachycardia [name] History of PE s/p IVC filter Hypertension Asymptomatic bacteriuria Depression Anxiety CONSULTS OBTAINED:  Treatment Team:  Minna Merritts, MD DRUG ALLERGIES:   Allergies  Allergen Reactions  . Erythromycin   . Sulfa Drugs Cross Reactors     rash   DISCHARGE MEDICATIONS:   Allergies as of 04/02/2019      Reactions   Erythromycin    Sulfa Drugs Cross Reactors    rash      Medication List    STOP taking these medications   amLODipine 2.5 MG tablet Commonly known as: NORVASC   baclofen 10 MG tablet Commonly known as: LIORESAL   bisoprolol 5 MG tablet Commonly known as: ZEBETA   hydrochlorothiazide 12.5 MG tablet Commonly known as: HYDRODIURIL   letrozole 2.5 MG tablet Commonly known as: FEMARA   mirtazapine 7.5 MG tablet Commonly known as: REMERON     TAKE these medications   albuterol 108 (90 Base) MCG/ACT inhaler Commonly known as: VENTOLIN HFA Inhale 2 puffs into the lungs every 6 (six) hours as needed for wheezing or shortness of breath.   ALPRAZolam 0.5 MG tablet Commonly known as: XANAX Take 0.5 tablets (0.25 mg total) by mouth at bedtime as needed for anxiety or sleep.   Biotin 10 MG Tabs Take 10 mg by mouth daily.   diphenoxylate-atropine 2.5-0.025 MG tablet Commonly known as: Lomotil Take 1 tablet by mouth 4 (four) times daily as needed for diarrhea or loose stools.   Fluocinolone Acetonide 0.01 %  Oil   fluticasone 50 MCG/ACT nasal spray Commonly known as: FLONASE Place 2 sprays into both nostrils daily.   metoprolol tartrate 50 MG tablet Commonly known as: LOPRESSOR Take 1 tablet (50 mg total) by mouth 2 (two) times daily.   montelukast 10 MG tablet Commonly known as: SINGULAIR Take 1 tablet (10 mg total) by mouth at bedtime.   Prolia 60 MG/ML Sosy injection Generic drug: denosumab Prolia 60 mg/mL  subcutaneous syringe  Inject 1 mL by subcutaneous route.   VITAMIN D PO Take 2,000 mg by mouth daily.   warfarin 3 MG tablet Commonly known as: COUMADIN Take 1 tablet (3 mg total) by mouth daily. What changed: additional instructions   warfarin 1 MG tablet Commonly known as: Coumadin Take 1 mg by mouth on Tuesday and Saturday What changed: Another medication with the same name was changed. Make sure you understand how and when to take each.   Xiidra 5 % Soln Generic drug: Lifitegrast Place 1-2 drops into both eyes daily.        DISCHARGE INSTRUCTIONS:  1.  Follow-up with PCP in 5 days 2.  Follow-up with cardiology in 1 to 2 weeks 3.  Stop Norvasc and HCTZ 4.  Start metoprolol 50 mg twice daily DIET:  Cardiac diet DISCHARGE CONDITION:  Stable ACTIVITY:  Activity as tolerated OXYGEN:  Home Oxygen: No.  Oxygen Delivery: room air DISCHARGE LOCATION:  home   If you experience worsening of your admission symptoms, develop shortness of breath, life threatening emergency, suicidal or homicidal thoughts you must seek medical attention immediately by calling 911 or calling your MD immediately  if symptoms less severe.  You Must read complete instructions/literature along with all the possible adverse reactions/side effects for all the Medicines you take and that have been prescribed to you. Take any new Medicines after you have completely understood and accpet all the possible adverse reactions/side effects.   Please note  You were cared for by a hospitalist during your hospital stay. If you have any questions about your discharge medications or the care you received while you were in the hospital after you are discharged, you can call the unit and asked to speak with the hospitalist on call if the hospitalist that took care of you is not available. Once you are discharged, your primary care physician will handle any further medical issues. Please note that NO REFILLS for any  discharge medications will be authorized once you are discharged, as it is imperative that you return to your primary care physician (or establish a relationship with a primary care physician if you do not have one) for your aftercare needs so that they can reassess your need for medications and monitor your lab values.    On the day of Discharge:  VITAL SIGNS:  Blood pressure (!) 163/90, pulse 83, temperature 97.7 F (36.5 C), temperature source Oral, resp. rate 16, height _0  (1.549 m), weight 55.7 kg, SpO2 100 %. PHYSICAL EXAMINATION:  GENERAL:  85 y.o.-year-old patient lying in the bed with no acute distress. Thin-appearing. EYES: Pupils equal, round, reactive to light and accommodation. No scleral icterus. Extraocular muscles intact.  HEENT: Head atraumatic, normocephalic. Oropharynx and nasopharynx clear.  NECK:  Supple, no jugular venous distention. No thyroid enlargement, no tenderness.  LUNGS: Normal breath sounds bilaterally, no wheezing, rales,rhonchi or crepitation. No use of accessory muscles of respiration.  CARDIOVASCULAR: RRR, S1, S2 normal. No murmurs, rubs, or gallops.  ABDOMEN: Soft, non-tender, non-distended. Bowel sounds present. No  organomegaly or mass.  EXTREMITIES: No pedal edema, cyanosis, or clubbing.  NEUROLOGIC: Cranial nerves II through XII are intact. Muscle strength 5/5 in all extremities. Sensation intact. Gait not checked.  PSYCHIATRIC: The patient is alert and oriented x 3.  SKIN: No obvious rash, lesion, or ulcer.  DATA REVIEW:   CBC Recent Labs  Lab 04/02/19 0737  WBC 6.1  HGB 13.8  HCT 42.4  PLT 219    Chemistries  Recent Labs  Lab 04/02/19 0737  NA 143  K 3.7  CL 112*  CO2 22  GLUCOSE 93  BUN 12  CREATININE 0.90  CALCIUM 8.6*     Microbiology Results  Results for orders placed or performed during the hospital encounter of 03/31/19  Urine Culture     Status: Abnormal   Collection Time: 03/31/19  7:13 PM   Specimen: Urine,  Random  Result Value Ref Range Status   Specimen Description   Final    URINE, RANDOM Performed at [facility], [address], [place], [place] [address]    Special Requests   Final    NONE Performed at [facility], [address], [place], [place] [address]    Culture (A)  Final    <10,000 COLONIES/mL INSIGNIFICANT GROWTH Performed at [facility], [place] [address] [place], [place] [address]    Report Status 04/02/2019 FINAL  Final  SARS Coronavirus 2 [facility] order, Performed in [facility] hospital lab) Nasopharyngeal Nasopharyngeal Swab     Status: None   Collection Time: 03/31/19  9:35 PM   Specimen: Nasopharyngeal Swab  Result Value Ref Range Status   SARS Coronavirus 2 NEGATIVE NEGATIVE Final    Comment: (NOTE) If result is NEGATIVE SARS-CoV-2 target nucleic acids are NOT DETECTED. The SARS-CoV-2 RNA is generally detectable in upper and lower  respiratory specimens during the acute phase of infection. The lowest  concentration of SARS-CoV-2 viral copies this assay can detect is 250  copies / mL. A negative result does not preclude SARS-CoV-2 infection  and should not be used as the sole basis for treatment or other  patient management decisions.  A negative result may occur with  improper specimen collection / handling, submission of specimen other  than nasopharyngeal swab, presence of viral mutation(s) within the  areas targeted by this assay, and inadequate number of viral copies  (<250 copies / mL). A negative result must be combined with clinical  observations, patient history, and epidemiological information. If result is POSITIVE SARS-CoV-2 target nucleic acids are DETECTED. The SARS-CoV-2 RNA is generally detectable in upper and lower  respiratory specimens dur ing the acute phase of infection.  Positive  results are indicative of active infection with SARS-CoV-2.  Clinical  correlation with patient history and other diagnostic  information is  necessary to determine patient infection status.  Positive results do  not rule out bacterial infection or co-infection with other viruses. If result is PRESUMPTIVE POSTIVE SARS-CoV-2 nucleic acids MAY BE PRESENT.   A presumptive positive result was obtained on the submitted specimen  and confirmed on repeat testing.  While 2019 novel coronavirus  (SARS-CoV-2) nucleic acids may be present in the submitted sample  additional confirmatory testing may be necessary for epidemiological  and / or clinical management purposes  to differentiate between  SARS-CoV-2 and other Sarbecovirus currently known to infect humans.  If clinically indicated additional testing with an alternate test  methodology [phone]) is advised. The SARS-CoV-2 RNA is generally  detectable in  upper and lower respiratory sp ecimens during the acute  phase of infection. The expected result is Negative. Fact Sheet for Patients:  StrictlyIdeas.no Fact Sheet for Healthcare Providers: BankingDealers.co.za This test is not yet approved or cleared by the [place] FDA and has been authorized for detection and/or diagnosis of SARS-CoV-2 by FDA under an Emergency Use Authorization (EUA).  This EUA will remain in effect (meaning this test can be used) for the duration of the COVID-19 declaration under Section 564(b)(1) of the Act, 21 U.S.C. section 360bbb-3(b)(1), unless the authorization is terminated or revoked sooner. Performed at [facility], [address] [place], [place] [address]   MRSA PCR Screening     Status: None   Collection Time: 03/31/19  9:35 PM  Result Value Ref Range Status   MRSA by PCR NEGATIVE NEGATIVE Final    Comment:        The GeneXpert MRSA Assay (FDA approved for NASAL specimens only), is one component of a comprehensive MRSA colonization surveillance program. It is not intended to diagnose MRSA infection nor to guide  or monitor treatment for MRSA infections. Performed at [facility], [address] [place], [place] [address]     RADIOLOGY:  No results found.   Management plans discussed with the patient, family and they are in agreement.  CODE STATUS: Full Code   TOTAL TIME TAKING CARE OF THIS PATIENT: 45 minutes.    Berna Spare  M.D on 04/02/2019 at 10:44 AM  Between 7am to 6pm - Pager - [phone]  After 6pm go to [email or web address] - [employer] [facility]  Office  [phone]  CC: Primary care physician; [name], MD   Note: This dictation was prepared with Dragon dictation along with smaller phrase technology. Any transcriptional errors that result from this process are unintentional.

## 2019-04-02 NOTE — Plan of Care (Signed)
Problem: Education: Goal: Knowledge of General Education information will improve Description: Including pain rating scale, medication(s)/side effects and non-pharmacologic comfort measures 04/02/2019 1508 by Aubery Lapping, RN Outcome: Completed/Met 04/02/2019 1056 by Aubery Lapping, RN Outcome: Progressing   Problem: Health Behavior/Discharge Planning: Goal: Ability to manage health-related needs will improve 04/02/2019 1508 by Aubery Lapping, RN Outcome: Completed/Met 04/02/2019 1056 by Aubery Lapping, RN Outcome: Progressing   Problem: Clinical Measurements: Goal: Ability to maintain clinical measurements within normal limits will improve 04/02/2019 1508 by Aubery Lapping, RN Outcome: Completed/Met 04/02/2019 1056 by Aubery Lapping, RN Outcome: Progressing Goal: Will remain free from infection 04/02/2019 1508 by Aubery Lapping, RN Outcome: Completed/Met 04/02/2019 1056 by Aubery Lapping, RN Outcome: Progressing Goal: Diagnostic test results will improve 04/02/2019 1508 by Aubery Lapping, RN Outcome: Completed/Met 04/02/2019 1056 by Aubery Lapping, RN Outcome: Progressing Goal: Respiratory complications will improve 04/02/2019 1508 by Aubery Lapping, RN Outcome: Completed/Met 04/02/2019 1056 by Aubery Lapping, RN Outcome: Progressing Goal: Cardiovascular complication will be avoided 04/02/2019 1508 by Aubery Lapping, RN Outcome: Completed/Met 04/02/2019 1056 by Aubery Lapping, RN Outcome: Progressing   Problem: Activity: Goal: Risk for activity intolerance will decrease 04/02/2019 1508 by Aubery Lapping, RN Outcome: Completed/Met 04/02/2019 1056 by Aubery Lapping, RN Outcome: Progressing   Problem: Nutrition: Goal: Adequate nutrition will be maintained 04/02/2019 1508 by Aubery Lapping, RN Outcome: Completed/Met 04/02/2019 1056 by Aubery Lapping, RN Outcome: Progressing   Problem: Coping: Goal: Level  of anxiety will decrease 04/02/2019 1508 by Aubery Lapping, RN Outcome: Completed/Met 04/02/2019 1056 by Aubery Lapping, RN Outcome: Progressing   Problem: Elimination: Goal: Will not experience complications related to bowel motility 04/02/2019 1508 by Aubery Lapping, RN Outcome: Completed/Met 04/02/2019 1056 by Aubery Lapping, RN Outcome: Progressing Goal: Will not experience complications related to urinary retention 04/02/2019 1508 by Aubery Lapping, RN Outcome: Completed/Met 04/02/2019 1056 by Aubery Lapping, RN Outcome: Progressing   Problem: Pain Managment: Goal: General experience of comfort will improve 04/02/2019 1508 by Aubery Lapping, RN Outcome: Completed/Met 04/02/2019 1056 by Aubery Lapping, RN Outcome: Progressing   Problem: Safety: Goal: Ability to remain free from injury will improve 04/02/2019 1508 by Aubery Lapping, RN Outcome: Completed/Met 04/02/2019 1056 by Aubery Lapping, RN Outcome: Progressing   Problem: Skin Integrity: Goal: Risk for impaired skin integrity will decrease 04/02/2019 1508 by Aubery Lapping, RN Outcome: Completed/Met 04/02/2019 1056 by Aubery Lapping, RN Outcome: Progressing   Problem: Education: Goal: Knowledge of disease or condition will improve 04/02/2019 1508 by Aubery Lapping, RN Outcome: Completed/Met 04/02/2019 1056 by Aubery Lapping, RN Outcome: Progressing Goal: Understanding of medication regimen will improve 04/02/2019 1508 by Aubery Lapping, RN Outcome: Completed/Met 04/02/2019 1056 by Aubery Lapping, RN Outcome: Progressing Goal: Individualized Educational Video(s) 04/02/2019 1508 by Aubery Lapping, RN Outcome: Completed/Met 04/02/2019 1056 by Aubery Lapping, RN Outcome: Progressing   Problem: Activity: Goal: Ability to tolerate increased activity will improve 04/02/2019 1508 by Aubery Lapping, RN Outcome: Completed/Met 04/02/2019 1056 by Aubery Lapping, RN Outcome: Progressing   Problem: Cardiac: Goal: Ability to achieve and maintain adequate cardiopulmonary perfusion will improve 04/02/2019 1508 by Aubery Lapping, RN Outcome: Completed/Met 04/02/2019 1056 by Aubery Lapping, RN Outcome: Progressing   Problem: Health Behavior/Discharge Planning: Goal: Ability to safely manage health-related needs after discharge will improve 04/02/2019 1508 by Aubery Lapping,  RN Outcome: Completed/Met 04/02/2019 1056 by Aubery Lapping, RN Outcome: Progressing   Problem: Acute Rehab PT Goals(only PT should resolve) Goal: Patient Will Transfer Sit To/From Stand Outcome: Completed/Met Goal: Pt Will Ambulate Outcome: Completed/Met

## 2019-04-04 ENCOUNTER — Telehealth: Payer: Self-pay | Admitting: Family Medicine

## 2019-04-04 DIAGNOSIS — I498 Other specified cardiac arrhythmias: Secondary | ICD-10-CM

## 2019-04-04 NOTE — Telephone Encounter (Signed)
Transition Care Management Follow-up Telephone Call How have you been since you were released from the hospital? Patient says she feels some better since being in the hospital per patient she says she has a lot of questions about her medications, patient then put her son Consuello Bossier he phone and he stated he is not capable of helping her fix a pills box and needs help.  Patient is confused about what she is to take. I see patient was recommended Home Health PT no order fopr nursing and patient needs help with medications. Left message at [place] office for return call, patient  Does not know what medications to take. Spoke with referrals and she advised might could get [facility] to go ut without a face to face first to do medication management.   Do you understand why you were in the hospital? yes   Do you understand the discharge instrcutions? yes  Items Reviewed:  Medications reviewed: yes  Allergies reviewed: yes  Dietary changes reviewed: yes  Referrals reviewed: yes   Functional Questionnaire:   Activities of Daily Living (ADLs):   She states they are independent in the following: ambulation, bathing and hygiene, feeding, continence, grooming, toileting and dressing States they require assistance with the following: No assistance needed with ADLs , just medication mangement.   Any transportation issues/concerns?: no   Any patient concerns? no   Confirmed importance and date/time of follow-up visits scheduled: yes   Confirmed with patient if condition begins to worsen call PCP or go to the ER.  Patient was given the Call-a-Nurse line [phone]: yes

## 2019-04-04 NOTE — Telephone Encounter (Signed)
I am fine with home health nursing coming out to help her.  I will place an order.  Discussed possibly doing a phone follow-up with the patient tomorrow.  [name] will reach out to her.

## 2019-04-05 ENCOUNTER — Other Ambulatory Visit: Payer: Self-pay

## 2019-04-05 ENCOUNTER — Encounter: Payer: Self-pay | Admitting: Family Medicine

## 2019-04-05 ENCOUNTER — Ambulatory Visit (INDEPENDENT_AMBULATORY_CARE_PROVIDER_SITE_OTHER): Payer: Medicare Other | Admitting: Family Medicine

## 2019-04-05 DIAGNOSIS — I471 Supraventricular tachycardia: Secondary | ICD-10-CM

## 2019-04-05 DIAGNOSIS — I10 Essential (primary) hypertension: Secondary | ICD-10-CM | POA: Diagnosis not present

## 2019-04-05 DIAGNOSIS — F330 Major depressive disorder, recurrent, mild: Secondary | ICD-10-CM

## 2019-04-05 MED ORDER — METOPROLOL TARTRATE 50 MG PO TABS: 50.0000 mg | ORAL_TABLET | Freq: Two times a day (BID) | ORAL | 2 refills | Status: DC

## 2019-04-05 MED ORDER — MIRTAZAPINE 7.5 MG PO TABS: 7.5000 mg | ORAL_TABLET | Freq: Every day | ORAL | 1 refills | Status: DC

## 2019-04-05 MED ORDER — FLUTICASONE PROPIONATE 50 MCG/ACT NA SUSP: 2.0000 | Freq: Every day | NASAL | 6 refills | Status: DC

## 2019-04-05 NOTE — Progress Notes (Signed)
Virtual Visit via telephone Note  This visit type was conducted due to national recommendations for restrictions regarding the COVID-19 pandemic (e.g. social distancing).  This format is felt to be most appropriate for this patient at this time.  All issues noted in this document were discussed and addressed.  No physical exam was performed (except for noted visual exam findings with Video Visits).   I connected with [name] today at  4:00 PM EDT by telephone and verified that I am speaking with the correct person using two identifiers. Location patient: home Location provider: work or home office Persons participating in the virtual visit: patient, provider  I discussed the limitations, risks, security and privacy concerns of performing an evaluation and management service by telephone and the availability of in person appointments. I also discussed with the patient that there may be a patient responsible charge related to this service. The patient expressed understanding and agreed to proceed.  Interactive audio and video telecommunications were attempted between this provider and patient, however failed, due to patient having technical difficulties OR patient did not have access to video capability.  We continued and completed visit with audio only.  Reason for visit: Hospital follow-up.  HPI: Patient was hospitalized for dizziness, palpitations, and presyncope.  She was diagnosed with atrial arrhythmia.  She was evaluated by cardiology and started on metoprolol 50 mg twice daily.  Heart rate has improved.  She notes rare symptoms since discharge from the hospital with some mild palpitations.  No chest pain or shortness of breath.  No presyncope.  She has follow-up scheduled with cardiology.  She will have home health PT.  Her INR was slightly low and needs to be rechecked.  They continued her on her home dose.  Blood pressure was well controlled in the hospital and they discontinued  Norvasc and hydrochlorothiazide given that they are starting on metoprolol.  Her blood pressure has been slightly elevated at home in the 140s over 80s and 90s.  They recommended tight BP control given her history of cerebral microhemorrhages.  She does note some depression given that she was in the hospital and with the COVID-19 pandemic going on.  She has not been taking Remeron.  She is on Xanax to help her sleep and this does not make her drowsy.  She does not drink alcohol.  She does seem to have some difficulty with her medications and the RN at her living facility is out of work at this time so she does not have anyone to help her with this.   ROS: See pertinent positives and negatives per HPI.  Past Medical History:  Diagnosis Date  . Allergy   . Breast cancer ([place]) 07/22/2012   T1c, Nx carcinoma left breast, ER 90%, PR 90%, HER-2/neu not over expressed.  . Hypertension   . Insomnia   . Osteoporosis    Prolia 10/2012  . Pulmonary embolus ([place]) 2009  . S/P IVC filter 2009    Past Surgical History:  Procedure Laterality Date  . BLADDER SURGERY    . BREAST SURGERY Left 2013   mastectomy  . CHOLECYSTECTOMY    . HERNIA REPAIR    . MASTECTOMY Left 2013   BREAST CA  . REPLACEMENT TOTAL KNEE     right  . TONSILLECTOMY      Family History  Problem Relation Age of Onset  . COPD Mother   . Stroke Father   . Cancer Father  breast  . Breast cancer Maternal Aunt     SOCIAL HX: Former smoker   Current Outpatient Medications:  .  albuterol (PROVENTIL HFA;VENTOLIN HFA) 108 (90 Base) MCG/ACT inhaler, Inhale 2 puffs into the lungs every 6 (six) hours as needed for wheezing or shortness of breath., Disp: 1 Inhaler, Rfl: 1 .  Biotin 10 MG TABS, Take 10 mg by mouth daily. , Disp: , Rfl:  .  Cholecalciferol (VITAMIN D PO), Take 2,000 mg by mouth daily., Disp: , Rfl:  .  denosumab (PROLIA) 60 MG/ML SOSY injection, Prolia 60 mg/mL subcutaneous syringe  Inject 1 mL by subcutaneous  route., Disp: , Rfl:  .  diphenoxylate-atropine (LOMOTIL) 2.5-0.025 MG tablet, Take 1 tablet by mouth 4 (four) times daily as needed for diarrhea or loose stools., Disp: 30 tablet, Rfl: 0 .  Fluocinolone Acetonide 0.01 % OIL, , Disp: , Rfl:  .  fluticasone (FLONASE) 50 MCG/ACT nasal spray, Place 2 sprays into both nostrils daily., Disp: 16 g, Rfl: 6 .  metoprolol tartrate (LOPRESSOR) 50 MG tablet, Take 1 tablet (50 mg total) by mouth 2 (two) times daily., Disp: 180 tablet, Rfl: 2 .  montelukast (SINGULAIR) 10 MG tablet, Take 1 tablet (10 mg total) by mouth at bedtime., Disp: 90 tablet, Rfl: 3 .  warfarin (COUMADIN) 1 MG tablet, Take 1 mg by mouth on Tuesday and Saturday, Disp: 30 tablet, Rfl: 3 .  warfarin (COUMADIN) 3 MG tablet, Take 1 tablet (3 mg total) by mouth daily. (Patient taking differently: Take 3 mg by mouth daily. Monday, Wednesday, Friday), Disp: 90 tablet, Rfl: 3 .  XIIDRA 5 % SOLN, Place 1-2 drops into both eyes daily. , Disp: , Rfl:  .  mirtazapine (REMERON) 7.5 MG tablet, Take 1 tablet (7.5 mg total) by mouth at bedtime., Disp: 90 tablet, Rfl: 1  EXAM: This was a telehealth telephone visit and thus no physical exam was completed.  ASSESSMENT AND PLAN:  Discussed the following assessment and plan:  Atrial tachycardia ([place]) Symptoms have improved quite significantly.  She does note occasional palpitations.  She will continue on metoprolol.  She will see cardiology as planned.  Monitor for worsening of symptoms.  Hypertension Blood pressure has been slightly elevated recently.  We will have her start back on amlodipine 2.5 mg once daily.  She will continue to monitor her blood pressures.  Depression Mild depression.  We will start her on Remeron to see if that will help with the depression and her sleep.  She can continue as needed Xanax for sleep for now though I did discuss the goal of getting her to the point where she does not need this.  We will try to arrange for the  patient to have an INR rechecked early next week.  She will continue with her current Coumadin dose.   Home health orders previously placed for home health RN to help with medication management.  I discussed the assessment and treatment plan with the patient. The patient was provided an opportunity to ask questions and all were answered. The patient agreed with the plan and demonstrated an understanding of the instructions.   The patient was advised to call back or seek an in-person evaluation if the symptoms worsen or if the condition fails to improve as anticipated.  I provided 29 minutes of non-face-to-face time during this encounter.   [name], MD

## 2019-04-05 NOTE — Telephone Encounter (Signed)
Patient is scheduled for telephone visit today at 4 .

## 2019-04-06 ENCOUNTER — Telehealth: Payer: Self-pay

## 2019-04-06 ENCOUNTER — Telehealth: Payer: Self-pay | Admitting: Family Medicine

## 2019-04-06 NOTE — Assessment & Plan Note (Signed)
Mild depression.  We will start her on Remeron to see if that will help with the depression and her sleep.  She can continue as needed Xanax for sleep for now though I did discuss the goal of getting her to the point where she does not need this.

## 2019-04-06 NOTE — Telephone Encounter (Signed)
I called and spoke with the patient's son and he stated she seemed more confused about her medication after her visit, he asked me to e-mail him her after visit summary and I emailed it to him.  He states that [facility] will be getting her medications from and it will be prepackaged for her to take daily.   Donzell Coller,cma

## 2019-04-06 NOTE — Assessment & Plan Note (Signed)
Symptoms have improved quite significantly.  She does note occasional palpitations.  She will continue on metoprolol.  She will see cardiology as planned.  Monitor for worsening of symptoms.

## 2019-04-06 NOTE — Telephone Encounter (Signed)
This patient needs an INR.  Can you contact her living facility and try to get this scheduled for her to be completed there early next week?

## 2019-04-06 NOTE — Telephone Encounter (Signed)
Copied from [place] [id number]. Topic: General - Inquiry >> Apr 06, 2019  8:50 AM Reyne Dumas L wrote: Reason for CRM:   Pt's son called.  States that he is confused on what medication pt needs to be taking.  Pt's son would like a call back states that he removed medication from the house after her hospital visit and now the pt is telling him that she needs the medication back after having a phone visit with PCP.

## 2019-04-06 NOTE — Assessment & Plan Note (Signed)
Blood pressure has been slightly elevated recently.  We will have her start back on amlodipine 2.5 mg once daily.  She will continue to monitor her blood pressures.

## 2019-04-07 NOTE — Telephone Encounter (Signed)
I called and spoke with Iantha Fallen and informed him that the provider wanted a PT/INR on the patient early next week.  He stated it would be done.  Nina,cma

## 2019-04-07 NOTE — Telephone Encounter (Signed)
Noted  

## 2019-04-10 ENCOUNTER — Inpatient Hospital Stay: Payer: Medicare Other | Admitting: Family Medicine

## 2019-04-10 DIAGNOSIS — Z86711 Personal history of pulmonary embolism: Secondary | ICD-10-CM | POA: Diagnosis not present

## 2019-04-10 DIAGNOSIS — I10 Essential (primary) hypertension: Secondary | ICD-10-CM | POA: Diagnosis not present

## 2019-04-10 DIAGNOSIS — Z7901 Long term (current) use of anticoagulants: Secondary | ICD-10-CM | POA: Diagnosis not present

## 2019-04-10 DIAGNOSIS — F418 Other specified anxiety disorders: Secondary | ICD-10-CM | POA: Diagnosis not present

## 2019-04-10 DIAGNOSIS — Z7983 Long term (current) use of bisphosphonates: Secondary | ICD-10-CM | POA: Diagnosis not present

## 2019-04-10 DIAGNOSIS — Z95828 Presence of other vascular implants and grafts: Secondary | ICD-10-CM | POA: Diagnosis not present

## 2019-04-10 DIAGNOSIS — M810 Age-related osteoporosis without current pathological fracture: Secondary | ICD-10-CM | POA: Diagnosis not present

## 2019-04-10 DIAGNOSIS — R42 Dizziness and giddiness: Secondary | ICD-10-CM | POA: Diagnosis not present

## 2019-04-10 DIAGNOSIS — I480 Paroxysmal atrial fibrillation: Secondary | ICD-10-CM | POA: Diagnosis not present

## 2019-04-12 DIAGNOSIS — Z20828 Contact with and (suspected) exposure to other viral communicable diseases: Secondary | ICD-10-CM | POA: Diagnosis not present

## 2019-04-12 DIAGNOSIS — Z1159 Encounter for screening for other viral diseases: Secondary | ICD-10-CM | POA: Diagnosis not present

## 2019-04-13 ENCOUNTER — Encounter: Payer: Self-pay | Admitting: Oncology

## 2019-04-13 ENCOUNTER — Other Ambulatory Visit: Payer: Self-pay

## 2019-04-14 ENCOUNTER — Inpatient Hospital Stay: Payer: Medicare Other | Attending: Oncology | Admitting: Oncology

## 2019-04-14 ENCOUNTER — Other Ambulatory Visit: Payer: Self-pay

## 2019-04-14 VITALS — BP 151/83 | HR 66 | Temp 98.1°F | Ht 60.0 in | Wt 132.0 lb

## 2019-04-14 DIAGNOSIS — R42 Dizziness and giddiness: Secondary | ICD-10-CM | POA: Insufficient documentation

## 2019-04-14 DIAGNOSIS — R55 Syncope and collapse: Secondary | ICD-10-CM | POA: Diagnosis not present

## 2019-04-14 DIAGNOSIS — F17210 Nicotine dependence, cigarettes, uncomplicated: Secondary | ICD-10-CM | POA: Insufficient documentation

## 2019-04-14 DIAGNOSIS — Z7901 Long term (current) use of anticoagulants: Secondary | ICD-10-CM | POA: Insufficient documentation

## 2019-04-14 DIAGNOSIS — Z86711 Personal history of pulmonary embolism: Secondary | ICD-10-CM | POA: Insufficient documentation

## 2019-04-14 DIAGNOSIS — M810 Age-related osteoporosis without current pathological fracture: Secondary | ICD-10-CM | POA: Insufficient documentation

## 2019-04-14 DIAGNOSIS — I6782 Cerebral ischemia: Secondary | ICD-10-CM | POA: Diagnosis not present

## 2019-04-14 DIAGNOSIS — G4700 Insomnia, unspecified: Secondary | ICD-10-CM | POA: Diagnosis not present

## 2019-04-14 DIAGNOSIS — Z803 Family history of malignant neoplasm of breast: Secondary | ICD-10-CM | POA: Insufficient documentation

## 2019-04-14 DIAGNOSIS — Z79899 Other long term (current) drug therapy: Secondary | ICD-10-CM | POA: Insufficient documentation

## 2019-04-14 DIAGNOSIS — Z853 Personal history of malignant neoplasm of breast: Secondary | ICD-10-CM | POA: Diagnosis not present

## 2019-04-14 DIAGNOSIS — Z95828 Presence of other vascular implants and grafts: Secondary | ICD-10-CM

## 2019-04-14 DIAGNOSIS — I10 Essential (primary) hypertension: Secondary | ICD-10-CM | POA: Insufficient documentation

## 2019-04-17 ENCOUNTER — Telehealth: Payer: Self-pay

## 2019-04-17 NOTE — Telephone Encounter (Signed)
Ok, can you please let sonnenberg's office know.  She still does not need f/u here.  Thanks!

## 2019-04-17 NOTE — Telephone Encounter (Signed)
Patient called stating that she is 85 years old and she does not want to be referred to no one. She stated that she did not wanted you to talk to Dr. [name] either and tell him what your recommendations were to be seen by a surgeon since she will not go. Patient stated that she is closing the book.

## 2019-04-18 ENCOUNTER — Other Ambulatory Visit: Payer: Self-pay

## 2019-04-19 ENCOUNTER — Telehealth: Payer: Self-pay | Admitting: Family Medicine

## 2019-04-19 ENCOUNTER — Encounter: Payer: Self-pay | Admitting: Family Medicine

## 2019-04-19 ENCOUNTER — Ambulatory Visit (INDEPENDENT_AMBULATORY_CARE_PROVIDER_SITE_OTHER): Payer: Medicare Other | Admitting: Family Medicine

## 2019-04-19 ENCOUNTER — Other Ambulatory Visit: Payer: Self-pay

## 2019-04-19 DIAGNOSIS — R21 Rash and other nonspecific skin eruption: Secondary | ICD-10-CM

## 2019-04-19 DIAGNOSIS — Z86711 Personal history of pulmonary embolism: Secondary | ICD-10-CM

## 2019-04-19 DIAGNOSIS — I10 Essential (primary) hypertension: Secondary | ICD-10-CM

## 2019-04-19 MED ORDER — LOSARTAN POTASSIUM 25 MG PO TABS: 25.0000 mg | ORAL_TABLET | Freq: Every day | ORAL | 1 refills | Status: DC

## 2019-04-19 MED ORDER — TRIAMCINOLONE ACETONIDE 0.1 % EX CREA: 1.0000 "application " | TOPICAL_CREAM | Freq: Two times a day (BID) | CUTANEOUS | 0 refills | Status: DC

## 2019-04-19 NOTE — Patient Instructions (Signed)
Nice to see you. We are going to add losartan to your medication regimen for your blood pressure.  You will need lab work in 7 days.  We will arrange for this to be completed through your living facility. Please try the triamcinolone on the rash on your neck.

## 2019-04-19 NOTE — Telephone Encounter (Signed)
Dr. [name] was informed about patient's decision of not going to see Dr. Ronalee Belts.

## 2019-04-19 NOTE — Telephone Encounter (Signed)
Pt son would like to speak with Nurse regarding pt care.   Daeja Ogbonna son Call @ work [phone] and cell [phone]. Best time to call 11 am Mon-Fri leave msg if not available please and Thank you!

## 2019-04-19 NOTE — Telephone Encounter (Signed)
I called and spoke with Iantha Fallen the RN at [facility] and informed him that the provider wants him to draw a BMP on the patient in 7 to 10 days after starting Losartan, Gaspar Bidding stated he would do that.  Jolicia Delira,cma

## 2019-04-19 NOTE — Progress Notes (Signed)
[name], MD Phone: [phone]  [name] is a 85 y.o. female who presents today for follow-up.    Hypertension: Typically has been running 129-153/79-95.  Has been above 145/90 about a third of the time.  Taking metoprolol.  She did not start on amlodipine.  She did not start on this because some fluttering sensation in her chest when she took that previously.  She took 1 dose of hydrochlorothiazide though it upset her stomach.  No chest pain or shortness of breath.  Rash: Patient notes for the last several months she has had an itchy red rash around her neck.  She has not tried any medication for it.  She has tried lotion on it.  History of PE: She did see hematology and they recommended she be evaluated to remove her IVC filter.  If this could be removed then she could come off the warfarin though if it remained in place she would need to remain on anticoagulation.  They did mention having her trial a NOAC in place of the warfarin.  The patient declined seeing vascular surgery as she does not want to undergo procedure for this.  Social History   Tobacco Use  Smoking Status Former Smoker  . Types: Cigarettes  . Quit date: 05/13/1999  . Years since quitting: 19.9  Smokeless Tobacco Never Used     ROS see history of present illness  Objective  Physical Exam Vitals:   04/19/19 1409  BP: 140/70  Pulse: 71  Temp: 97.7 F (36.5 C)  SpO2: 99%    BP Readings from Last 3 Encounters:  04/20/19 120/80  04/19/19 140/70  04/14/19 (!) 151/83   Wt Readings from Last 3 Encounters:  04/20/19 122 lb 8 oz (55.6 kg)  04/19/19 124 lb 3.2 oz (56.3 kg)  04/14/19 132 lb (59.9 kg)    Physical Exam Constitutional:      General: She is not in acute distress.    Appearance: She is not diaphoretic.  Cardiovascular:     Rate and Rhythm: Normal rate and regular rhythm.     Heart sounds: Normal heart sounds.  Pulmonary:     Effort: Pulmonary effort is normal.     Breath  sounds: Normal breath sounds.  Skin:    General: Skin is warm and dry.     Comments: Scattered erythematous rough rash around the base of her neck, no significant warmth, no tenderness  Neurological:     Mental Status: She is alert.      Assessment/Plan: Please see individual problem list.  Hypertension Above goal.  Given her intolerance of amlodipine and HCTZ we will trial losartan in addition to the metoprolol.  She will have labs at her living facility in 1 week.  Nurse BP check in 1 month.  History of pulmonary embolus (PE) Patient has declined seeing vascular surgery.  I had a discussion with her regarding the benefit of seeing them.  I also discussed possibly switching to Eliquis or Xarelto though she declines that as well.  She wants to remain on warfarin.  We will continue to discuss this option with her in the future.  Rash and nonspecific skin eruption Undetermined cause.  Potentially could be a contact dermatitis issue.  She will trial triamcinolone cream.  If not improving she will let us know.  Advised not to use this on her face.   No orders of the defined types were placed in this encounter.   Meds ordered this encounter  Medications  .  losartan (COZAAR) 25 MG tablet    Sig: Take 1 tablet (25 mg total) by mouth daily.    Dispense:  90 tablet    Refill:  1  . triamcinolone cream (KENALOG) 0.1 %    Sig: Apply 1 application topically 2 (two) times daily. For up to 7 days. Do not apply to the face.    Dispense:  30 g    Refill:  0     [name], MD [place]

## 2019-04-19 NOTE — Telephone Encounter (Signed)
Please contact Aaron Edelman at the patient's living facility to see if they can draw a BMP in 7 to 10 days from the patient starting the losartan.  Thanks.

## 2019-04-20 ENCOUNTER — Encounter: Payer: Self-pay | Admitting: Nurse Practitioner

## 2019-04-20 ENCOUNTER — Ambulatory Visit (INDEPENDENT_AMBULATORY_CARE_PROVIDER_SITE_OTHER): Payer: Medicare Other | Admitting: Nurse Practitioner

## 2019-04-20 VITALS — BP 120/80 | HR 64 | Ht 60.0 in | Wt 122.5 lb

## 2019-04-20 DIAGNOSIS — I10 Essential (primary) hypertension: Secondary | ICD-10-CM | POA: Diagnosis not present

## 2019-04-20 DIAGNOSIS — Z86711 Personal history of pulmonary embolism: Secondary | ICD-10-CM

## 2019-04-20 DIAGNOSIS — I471 Supraventricular tachycardia: Secondary | ICD-10-CM

## 2019-04-20 NOTE — Progress Notes (Signed)
Office Visit    Patient Name: [name] Date of Encounter: 04/20/2019  Primary Care Provider:  [name], MD Primary Cardiologist:  Ida Rogue, MD  Chief Complaint    85 year old female with a history of hypertension, PE status post IVC filter on chronic Coumadin, breast cancer, and depression, who presents for follow-up after recent hospitalization related to orthostatic lightheadedness and tachypalpitations with finding of atrial tachycardia.  Past Medical History    Past Medical History:  Diagnosis Date  . Allergy   . Breast cancer ([place]) 07/22/2012   T1c, Nx carcinoma left breast, ER 90%, PR 90%, HER-2/neu not over expressed.  . Hypertension   . Insomnia   . Osteoporosis    Prolia 10/2012  . PAT (paroxysmal atrial tachycardia) ([place])    a. Dx 03/2019 - improved w/ beta blocker; b. 03/2019 Echo: EF 55-60%. Impaired relaxation. RVSP 48.56mHg. Mild to mod MR/TR.  [place]  . S/P IVC filter 2009   Past Surgical History:  Procedure Laterality Date  . BLADDER SURGERY    . BREAST SURGERY Left 2013   mastectomy  . CHOLECYSTECTOMY    . HERNIA REPAIR    . MASTECTOMY Left 2013   BREAST CA  . REPLACEMENT TOTAL KNEE     right  . TONSILLECTOMY      Allergies  Allergies  Allergen Reactions  . Erythromycin   . Sulfa Drugs Cross Reactors     rash    History of Present Illness    85year old female with the above past medical history including hypertension, PE status post IVC filter in 2009 on chronic Coumadin, breast cancer, and depression.  She was recently placed on amlodipine and HCTZ in the setting of hypertension and began to note orthostatic lightheadedness.  In mid August, she also noted intermittent tachypalpitations.  Due to both symptoms, she presented to the emergency department on September 4.  She was noted to have runs of atrial tachycardia with rates up into the 130s.  Head CT was negative for any acute changes.  Troponin  was normal.  She was seen by Dr. gColman Caterwith recommendation for discontinuation of amlodipine and HCTZ and addition of metoprolol 50 mg twice daily.  With this, she had less atrial tachycardia burden.  Echocardiogram was performed and showed normal LV function with impaired relaxation.  She was subsequently discharged home on September 6.  At primary care follow-up, she was hypertensive and amlodipine was added back however she never resumed it due to concern for palpitations.  Instead, losartan was added yesterday.  Of note, she is also follow-up with hematology with recommendation for vascular follow-up for IVC removal.  It is felt that she would be able to come off of anticoagulation if IVC could be removed.  She has been reluctant to have it removed in the past as she says she is okay with taking Coumadin and would just prefer to avoid another procedure.  From a cardiac standpoint, since discharge, she has noted only very mild and rare palpitations may be occurring once a day for about 15 seconds and resolving spontaneously.  In all, she is not bothered by this.  She denies chest pain, dyspnea, PND, orthopnea, dizziness, syncope, edema, or early satiety.  Home Medications    Prior to Admission medications   Medication Sig Start Date End Date Taking? Authorizing Provider  albuterol (PROVENTIL HFA;VENTOLIN HFA) 108 (90 Base) MCG/ACT inhaler Inhale 2 puffs into the lungs every 6 (six) hours  as needed for wheezing or shortness of breath. 09/01/18   Jodelle Green, FNP  Biotin 10 MG TABS Take 10 mg by mouth daily.     [provider]  Cholecalciferol (VITAMIN D PO) Take 2,000 mg by mouth daily.    [provider]  denosumab (PROLIA) 60 MG/ML SOSY injection Prolia 60 mg/mL subcutaneous syringe  Inject 1 mL by subcutaneous route.    [provider]  diphenoxylate-atropine (LOMOTIL) 2.5-0.025 MG tablet Take 1 tablet by mouth 4 (four) times daily as needed for diarrhea or loose stools.  12/01/17   [name], MD  fluticasone (FLONASE) 50 MCG/ACT nasal spray Place 2 sprays into both nostrils daily. 04/05/19   [name], MD  losartan (COZAAR) 25 MG tablet Take 1 tablet (25 mg total) by mouth daily. 04/19/19   [name], MD  metoprolol tartrate (LOPRESSOR) 50 MG tablet Take 1 tablet (50 mg total) by mouth 2 (two) times daily. 04/05/19   [name], MD  montelukast (SINGULAIR) 10 MG tablet Take 1 tablet (10 mg total) by mouth at bedtime. 03/20/19   [name], MD  triamcinolone cream (KENALOG) 0.1 % Apply 1 application topically 2 (two) times daily. For up to 7 days. Do not apply to the face. 04/19/19   [name], MD  warfarin (COUMADIN) 1 MG tablet Take 1 mg by mouth on Tuesday and Saturday 02/07/19   [name], MD  warfarin (COUMADIN) 3 MG tablet Take 1 tablet (3 mg total) by mouth daily. Patient taking differently: Take 3 mg by mouth daily. Monday, Wednesday, Friday 02/07/19   [name], MD  XIIDRA 5 % SOLN Place 1-2 drops into both eyes daily.  12/06/17   [provider]    Review of Systems    Very mild and brief tachypalpitations occurring about once a day lasting about 15 seconds, and resolving spontaneously.  Burden is significantly improved after initiation of beta-blocker therapy.  She denies chest pain, PND, dyspnea, orthopnea, dizziness, syncope, edema, or early satiety.  All other systems reviewed and are otherwise negative except as noted above.  Physical Exam    VS:  BP 120/80 (BP Location: Right Arm, Patient Position: Sitting, Cuff Size: Normal)   Pulse 64   Ht 5' (1.524 m)   Wt 122 lb 8 oz (55.6 kg)   BMI 23.92 kg/m  , BMI Body mass index is 23.92 kg/m. GEN: Well nourished, well developed, in no acute distress. HEENT: normal. Neck: Supple, no JVD, carotid bruits, or masses. Cardiac: RRR, no murmurs, rubs, or gallops. No clubbing, cyanosis, edema.  Radials/DP/PT 2+ and equal bilaterally.   Respiratory:  Respirations regular and unlabored, clear to auscultation bilaterally. GI: Soft, nontender, nondistended, BS + x 4. MS: no deformity or atrophy. Skin: warm and dry, no rash. Neuro:  Strength and sensation are intact. Psych: Normal affect.  Accessory Clinical Findings    ECG personally reviewed by me today -regular sinus rhythm, 64- no acute changes.  Lab Results  Component Value Date   WBC 6.1 04/02/2019   HGB 13.8 04/02/2019   HCT 42.4 04/02/2019   MCV 90.0 04/02/2019   PLT 219 04/02/2019   Lab Results  Component Value Date   CREATININE 0.90 04/02/2019   BUN 12 04/02/2019   NA 143 04/02/2019   K 3.7 04/02/2019   CL 112 (H) 04/02/2019   CO2 22 04/02/2019   Lab Results  Component Value Date   ALT 8  03/20/2019   AST 14 03/20/2019   ALKPHOS 36 (L) 03/20/2019   BILITOT 0.6 03/20/2019   Lab Results  Component Value Date   CHOL 191 10/02/2015   HDL 52 10/02/2015   LDLCALC 112 (H) 10/02/2015   TRIG 134 10/02/2015   CHOLHDL 3 08/31/2014     Assessment & Plan    1.  Paroxysmal atrial tachycardia: Noted during recent hospitalization for palpitations and orthostatic lightheadedness.  Significantly improved following initiation of beta-blocker therapy, stating she experiences mild daily palpitations lasting about 15 seconds and resolving spontaneously.  She says it feels like a small butterfly is on her chest and then flies away.  Given significant improvement in burden and stable symptoms, continue current dose of beta-blocker.  2.  Essential hypertension: Blood pressure stable today at 126/80.  She was placed on losartan yesterday and also continues to take beta-blocker.  3.  History of pulmonary embolism: Status post prior IVC filter and she is on chronic Coumadin.  She has been evaluated by hematology with recommendation for vascular surgery evaluation as if IVC were removed, she would no longer require anticoagulation.  She and her son did not fully  understand that messaging previously.  I did recommend that she consider discussing removal with vascular surgery because she does not enjoy taking Coumadin and finds it to be quite a hassle.  At this point, it sounds like she would go forward with vascular surgery consultation.  4.  Disposition: 40 minutes spent with patient and son today answering questions.  Follow-up in clinic in 3 months or sooner if necessary.   Murray Hodgkins, NP 04/20/2019, 6:40 PM

## 2019-04-20 NOTE — Patient Instructions (Signed)
Medication Instructions:  Your physician recommends that you continue on your current medications as directed. Please refer to the Current Medication list given to you today.  If you need a refill on your cardiac medications before your next appointment, please call your pharmacy.   Lab work: None ordered  If you have labs (blood work) drawn today and your tests are completely normal, you will receive your results only by: Marland Kitchen MyChart Message (if you have MyChart) OR . A paper copy in the mail If you have any lab test that is abnormal or we need to change your treatment, we will call you to review the results.  Testing/Procedures: None ordered   Follow-Up: At [facility] ([place] you and your health needs are our priority.  As part of our continuing mission to provide you with exceptional heart care, we have created designated Provider Care Teams.  These Care Teams include your primary Cardiologist (physician) and Advanced Practice Providers (APPs -  Physician Assistants and Nurse Practitioners) who all work together to provide you with the care you need, when you need it. You will need a follow up appointment in 3 months. You may see Dr. Rockey Situ or Murray Hodgkins, NP.

## 2019-04-21 DIAGNOSIS — R42 Dizziness and giddiness: Secondary | ICD-10-CM | POA: Diagnosis not present

## 2019-04-21 DIAGNOSIS — F418 Other specified anxiety disorders: Secondary | ICD-10-CM | POA: Diagnosis not present

## 2019-04-21 DIAGNOSIS — Z7983 Long term (current) use of bisphosphonates: Secondary | ICD-10-CM | POA: Diagnosis not present

## 2019-04-21 DIAGNOSIS — M810 Age-related osteoporosis without current pathological fracture: Secondary | ICD-10-CM | POA: Diagnosis not present

## 2019-04-21 DIAGNOSIS — I480 Paroxysmal atrial fibrillation: Secondary | ICD-10-CM | POA: Diagnosis not present

## 2019-04-21 DIAGNOSIS — I10 Essential (primary) hypertension: Secondary | ICD-10-CM | POA: Diagnosis not present

## 2019-04-22 ENCOUNTER — Telehealth: Payer: Self-pay | Admitting: Family Medicine

## 2019-04-22 NOTE — Assessment & Plan Note (Signed)
Patient has declined seeing vascular surgery.  I had a discussion with her regarding the benefit of seeing them.  I also discussed possibly switching to Eliquis or Xarelto though she declines that as well.  She wants to remain on warfarin.  We will continue to discuss this option with her in the future.

## 2019-04-22 NOTE — Telephone Encounter (Signed)
Opened in error

## 2019-04-22 NOTE — Assessment & Plan Note (Signed)
Undetermined cause.  Potentially could be a contact dermatitis issue.  She will trial triamcinolone cream.  If not improving she will let us know.  Advised not to use this on her face.

## 2019-04-22 NOTE — Assessment & Plan Note (Addendum)
Above goal.  Given her intolerance of amlodipine and HCTZ we will trial losartan in addition to the metoprolol.  She will have labs at her living facility in 1 week.  Nurse BP check in 1 month.

## 2019-04-27 DIAGNOSIS — R279 Unspecified lack of coordination: Secondary | ICD-10-CM | POA: Diagnosis not present

## 2019-04-27 DIAGNOSIS — M6281 Muscle weakness (generalized): Secondary | ICD-10-CM | POA: Diagnosis not present

## 2019-04-27 DIAGNOSIS — R2689 Other abnormalities of gait and mobility: Secondary | ICD-10-CM | POA: Diagnosis not present

## 2019-04-27 DIAGNOSIS — M5412 Radiculopathy, cervical region: Secondary | ICD-10-CM | POA: Diagnosis not present

## 2019-04-27 DIAGNOSIS — R41841 Cognitive communication deficit: Secondary | ICD-10-CM | POA: Diagnosis not present

## 2019-04-28 DIAGNOSIS — R41841 Cognitive communication deficit: Secondary | ICD-10-CM | POA: Diagnosis not present

## 2019-04-28 DIAGNOSIS — R279 Unspecified lack of coordination: Secondary | ICD-10-CM | POA: Diagnosis not present

## 2019-04-28 DIAGNOSIS — R2689 Other abnormalities of gait and mobility: Secondary | ICD-10-CM | POA: Diagnosis not present

## 2019-04-28 DIAGNOSIS — M5412 Radiculopathy, cervical region: Secondary | ICD-10-CM | POA: Diagnosis not present

## 2019-04-28 DIAGNOSIS — M6281 Muscle weakness (generalized): Secondary | ICD-10-CM | POA: Diagnosis not present

## 2019-05-02 DIAGNOSIS — R279 Unspecified lack of coordination: Secondary | ICD-10-CM | POA: Diagnosis not present

## 2019-05-02 DIAGNOSIS — M5412 Radiculopathy, cervical region: Secondary | ICD-10-CM | POA: Diagnosis not present

## 2019-05-02 DIAGNOSIS — R2689 Other abnormalities of gait and mobility: Secondary | ICD-10-CM | POA: Diagnosis not present

## 2019-05-02 DIAGNOSIS — R41841 Cognitive communication deficit: Secondary | ICD-10-CM | POA: Diagnosis not present

## 2019-05-02 DIAGNOSIS — Z7901 Long term (current) use of anticoagulants: Secondary | ICD-10-CM | POA: Diagnosis not present

## 2019-05-02 DIAGNOSIS — M6281 Muscle weakness (generalized): Secondary | ICD-10-CM | POA: Diagnosis not present

## 2019-05-02 LAB — BASIC METABOLIC PANEL
BUN: 12 (ref 4–21)
Creatinine: 0.9 (ref 0.5–1.1)
Glucose: 107
Potassium: 4.2 (ref 3.4–5.3)
Sodium: 142 (ref 137–147)

## 2019-05-03 ENCOUNTER — Other Ambulatory Visit: Payer: Self-pay | Admitting: General Surgery

## 2019-05-03 DIAGNOSIS — M5412 Radiculopathy, cervical region: Secondary | ICD-10-CM | POA: Diagnosis not present

## 2019-05-03 DIAGNOSIS — R41841 Cognitive communication deficit: Secondary | ICD-10-CM | POA: Diagnosis not present

## 2019-05-03 DIAGNOSIS — R279 Unspecified lack of coordination: Secondary | ICD-10-CM | POA: Diagnosis not present

## 2019-05-03 DIAGNOSIS — Z1231 Encounter for screening mammogram for malignant neoplasm of breast: Secondary | ICD-10-CM

## 2019-05-03 DIAGNOSIS — M6281 Muscle weakness (generalized): Secondary | ICD-10-CM | POA: Diagnosis not present

## 2019-05-03 DIAGNOSIS — R2689 Other abnormalities of gait and mobility: Secondary | ICD-10-CM | POA: Diagnosis not present

## 2019-05-03 NOTE — Telephone Encounter (Addendum)
Son returned call please call back today best # [phone]

## 2019-05-03 NOTE — Telephone Encounter (Signed)
LM asking patient's sonto call back if needed. I stated that I could not see where there was any correspondence between him & anyone here in regards to this message.

## 2019-05-04 DIAGNOSIS — R41841 Cognitive communication deficit: Secondary | ICD-10-CM | POA: Diagnosis not present

## 2019-05-04 DIAGNOSIS — M5412 Radiculopathy, cervical region: Secondary | ICD-10-CM | POA: Diagnosis not present

## 2019-05-04 DIAGNOSIS — M6281 Muscle weakness (generalized): Secondary | ICD-10-CM | POA: Diagnosis not present

## 2019-05-04 DIAGNOSIS — R2689 Other abnormalities of gait and mobility: Secondary | ICD-10-CM | POA: Diagnosis not present

## 2019-05-04 DIAGNOSIS — R279 Unspecified lack of coordination: Secondary | ICD-10-CM | POA: Diagnosis not present

## 2019-05-07 NOTE — Telephone Encounter (Signed)
The occupational and speech therapy is to help her with her cognitive difficulties.  She has had some mild memory difficulty in the past when we have evaluated her previously.  We should reevaluate this with her next office visit.  If they desire we can try to get her in sooner than her next scheduled visit in December and discuss this further.

## 2019-05-08 NOTE — Telephone Encounter (Signed)
I called and spoke with the patients son, Maryse Nofsinger and I informed him why she was taking the occupational and speech therapy and I asked if he wanted her appt changed from December to sooner and he stated no keep the appointment, He also stated that he had sent a prior message this morning to change [facility], he stated he changed his mind he is not changing pharmacies at this time.  Nina,cma

## 2019-05-09 DIAGNOSIS — R2689 Other abnormalities of gait and mobility: Secondary | ICD-10-CM | POA: Diagnosis not present

## 2019-05-09 DIAGNOSIS — R41841 Cognitive communication deficit: Secondary | ICD-10-CM | POA: Diagnosis not present

## 2019-05-09 DIAGNOSIS — R279 Unspecified lack of coordination: Secondary | ICD-10-CM | POA: Diagnosis not present

## 2019-05-09 DIAGNOSIS — M5412 Radiculopathy, cervical region: Secondary | ICD-10-CM | POA: Diagnosis not present

## 2019-05-09 DIAGNOSIS — M6281 Muscle weakness (generalized): Secondary | ICD-10-CM | POA: Diagnosis not present

## 2019-05-10 DIAGNOSIS — M5412 Radiculopathy, cervical region: Secondary | ICD-10-CM | POA: Diagnosis not present

## 2019-05-10 DIAGNOSIS — R279 Unspecified lack of coordination: Secondary | ICD-10-CM | POA: Diagnosis not present

## 2019-05-10 DIAGNOSIS — R2689 Other abnormalities of gait and mobility: Secondary | ICD-10-CM | POA: Diagnosis not present

## 2019-05-10 DIAGNOSIS — M6281 Muscle weakness (generalized): Secondary | ICD-10-CM | POA: Diagnosis not present

## 2019-05-10 DIAGNOSIS — R41841 Cognitive communication deficit: Secondary | ICD-10-CM | POA: Diagnosis not present

## 2019-05-11 ENCOUNTER — Ambulatory Visit: Payer: Medicare Other

## 2019-05-11 ENCOUNTER — Telehealth: Payer: Self-pay | Admitting: Family Medicine

## 2019-05-11 NOTE — Telephone Encounter (Signed)
Please call [name] and the patient and let them know that her INR is acceptable. She should continue her current regimen and have this rechecked in 1 month. Her other labs are acceptable. Thanks.

## 2019-05-12 NOTE — Telephone Encounter (Signed)
I called and left a voicemail on Bryam lewis phone informing him the the patient's INR was good and she should continue her current regimen and to recheck in 1 month.  Victorious Kundinger,cma

## 2019-05-16 ENCOUNTER — Other Ambulatory Visit: Payer: Self-pay | Admitting: General Surgery

## 2019-05-16 DIAGNOSIS — M6281 Muscle weakness (generalized): Secondary | ICD-10-CM | POA: Diagnosis not present

## 2019-05-16 DIAGNOSIS — R41841 Cognitive communication deficit: Secondary | ICD-10-CM | POA: Diagnosis not present

## 2019-05-16 DIAGNOSIS — R2689 Other abnormalities of gait and mobility: Secondary | ICD-10-CM | POA: Diagnosis not present

## 2019-05-16 DIAGNOSIS — R279 Unspecified lack of coordination: Secondary | ICD-10-CM | POA: Diagnosis not present

## 2019-05-16 DIAGNOSIS — M5412 Radiculopathy, cervical region: Secondary | ICD-10-CM | POA: Diagnosis not present

## 2019-05-17 ENCOUNTER — Other Ambulatory Visit: Payer: Self-pay | Admitting: General Surgery

## 2019-05-17 DIAGNOSIS — Z1231 Encounter for screening mammogram for malignant neoplasm of breast: Secondary | ICD-10-CM

## 2019-05-18 DIAGNOSIS — M6281 Muscle weakness (generalized): Secondary | ICD-10-CM | POA: Diagnosis not present

## 2019-05-18 DIAGNOSIS — R41841 Cognitive communication deficit: Secondary | ICD-10-CM | POA: Diagnosis not present

## 2019-05-18 DIAGNOSIS — M5412 Radiculopathy, cervical region: Secondary | ICD-10-CM | POA: Diagnosis not present

## 2019-05-18 DIAGNOSIS — R279 Unspecified lack of coordination: Secondary | ICD-10-CM | POA: Diagnosis not present

## 2019-05-18 DIAGNOSIS — R2689 Other abnormalities of gait and mobility: Secondary | ICD-10-CM | POA: Diagnosis not present

## 2019-05-23 ENCOUNTER — Other Ambulatory Visit: Payer: Self-pay

## 2019-05-23 ENCOUNTER — Ambulatory Visit (INDEPENDENT_AMBULATORY_CARE_PROVIDER_SITE_OTHER): Payer: Medicare Other | Admitting: *Deleted

## 2019-05-23 VITALS — BP 140/84 | HR 66 | Resp 16

## 2019-05-23 DIAGNOSIS — M8580 Other specified disorders of bone density and structure, unspecified site: Secondary | ICD-10-CM | POA: Diagnosis not present

## 2019-05-23 MED ORDER — DENOSUMAB 60 MG/ML ~~LOC~~ SOSY
60.0000 mg | PREFILLED_SYRINGE | Freq: Once | SUBCUTANEOUS | Status: AC
  Administered 2019-05-23: 60 mg via SUBCUTANEOUS

## 2019-05-23 MED ORDER — ALPRAZOLAM 0.5 MG PO TABS: 0.2500 mg | ORAL_TABLET | Freq: Every evening | ORAL | 0 refills | Status: DC | PRN

## 2019-05-23 NOTE — Progress Notes (Signed)
Patient presented for Prolia injection to Right arm [place], patient voiced no concerns or complaints during or after injection. Patient also requested refill on alprazolam as requested the last dose I saw ordered was 0.5 take 0.5 tablet for total of 0.25 mg I have pended per our conversation.   Patient here for nurse visit BP check per order from 04/19/19.   Patient reports compliance with prescribed BP medications: yes  Last dose of BP medication: THIS MORNING  BP Readings from Last 3 Encounters:  05/23/19 140/84  04/20/19 120/80  04/19/19 140/70   Pulse Readings from Last 3 Encounters:  05/23/19 66  04/20/19 64  04/19/19 71      Patient verbalized understanding of instructions.   Kerin Salen, RN

## 2019-05-24 DIAGNOSIS — R2689 Other abnormalities of gait and mobility: Secondary | ICD-10-CM | POA: Diagnosis not present

## 2019-05-24 DIAGNOSIS — R279 Unspecified lack of coordination: Secondary | ICD-10-CM | POA: Diagnosis not present

## 2019-05-24 DIAGNOSIS — M6281 Muscle weakness (generalized): Secondary | ICD-10-CM | POA: Diagnosis not present

## 2019-05-24 DIAGNOSIS — R41841 Cognitive communication deficit: Secondary | ICD-10-CM | POA: Diagnosis not present

## 2019-05-24 DIAGNOSIS — M5412 Radiculopathy, cervical region: Secondary | ICD-10-CM | POA: Diagnosis not present

## 2019-05-25 DIAGNOSIS — R2689 Other abnormalities of gait and mobility: Secondary | ICD-10-CM | POA: Diagnosis not present

## 2019-05-25 DIAGNOSIS — M5412 Radiculopathy, cervical region: Secondary | ICD-10-CM | POA: Diagnosis not present

## 2019-05-25 DIAGNOSIS — R41841 Cognitive communication deficit: Secondary | ICD-10-CM | POA: Diagnosis not present

## 2019-05-25 DIAGNOSIS — M6281 Muscle weakness (generalized): Secondary | ICD-10-CM | POA: Diagnosis not present

## 2019-05-25 DIAGNOSIS — R279 Unspecified lack of coordination: Secondary | ICD-10-CM | POA: Diagnosis not present

## 2019-05-31 DIAGNOSIS — R41841 Cognitive communication deficit: Secondary | ICD-10-CM | POA: Diagnosis not present

## 2019-05-31 DIAGNOSIS — M5412 Radiculopathy, cervical region: Secondary | ICD-10-CM | POA: Diagnosis not present

## 2019-05-31 DIAGNOSIS — M6281 Muscle weakness (generalized): Secondary | ICD-10-CM | POA: Diagnosis not present

## 2019-05-31 DIAGNOSIS — R2689 Other abnormalities of gait and mobility: Secondary | ICD-10-CM | POA: Diagnosis not present

## 2019-05-31 DIAGNOSIS — R279 Unspecified lack of coordination: Secondary | ICD-10-CM | POA: Diagnosis not present

## 2019-06-01 ENCOUNTER — Observation Stay
Admission: EM | Admit: 2019-06-01 | Discharge: 2019-06-02 | Disposition: A | Discharge status: Home / Self Care | Payer: Medicare Other | Attending: Internal Medicine | Admitting: Internal Medicine

## 2019-06-01 ENCOUNTER — Ambulatory Visit: Payer: Self-pay | Admitting: *Deleted

## 2019-06-01 ENCOUNTER — Other Ambulatory Visit: Payer: Self-pay

## 2019-06-01 ENCOUNTER — Emergency Department: Payer: Medicare Other

## 2019-06-01 ENCOUNTER — Encounter: Payer: Self-pay | Admitting: Intensive Care

## 2019-06-01 DIAGNOSIS — Z7983 Long term (current) use of bisphosphonates: Secondary | ICD-10-CM | POA: Diagnosis not present

## 2019-06-01 DIAGNOSIS — I4891 Unspecified atrial fibrillation: Secondary | ICD-10-CM | POA: Diagnosis not present

## 2019-06-01 DIAGNOSIS — R791 Abnormal coagulation profile: Secondary | ICD-10-CM | POA: Diagnosis not present

## 2019-06-01 DIAGNOSIS — Z853 Personal history of malignant neoplasm of breast: Secondary | ICD-10-CM | POA: Insufficient documentation

## 2019-06-01 DIAGNOSIS — I10 Essential (primary) hypertension: Secondary | ICD-10-CM | POA: Diagnosis not present

## 2019-06-01 DIAGNOSIS — I6782 Cerebral ischemia: Secondary | ICD-10-CM | POA: Insufficient documentation

## 2019-06-01 DIAGNOSIS — R279 Unspecified lack of coordination: Secondary | ICD-10-CM | POA: Diagnosis not present

## 2019-06-01 DIAGNOSIS — Z87891 Personal history of nicotine dependence: Secondary | ICD-10-CM | POA: Insufficient documentation

## 2019-06-01 DIAGNOSIS — F419 Anxiety disorder, unspecified: Secondary | ICD-10-CM | POA: Diagnosis not present

## 2019-06-01 DIAGNOSIS — M5412 Radiculopathy, cervical region: Secondary | ICD-10-CM | POA: Diagnosis not present

## 2019-06-01 DIAGNOSIS — Z86711 Personal history of pulmonary embolism: Secondary | ICD-10-CM | POA: Diagnosis present

## 2019-06-01 DIAGNOSIS — Z79899 Other long term (current) drug therapy: Secondary | ICD-10-CM | POA: Insufficient documentation

## 2019-06-01 DIAGNOSIS — I471 Supraventricular tachycardia: Secondary | ICD-10-CM | POA: Diagnosis not present

## 2019-06-01 DIAGNOSIS — Z96651 Presence of right artificial knee joint: Secondary | ICD-10-CM | POA: Insufficient documentation

## 2019-06-01 DIAGNOSIS — Z7901 Long term (current) use of anticoagulants: Secondary | ICD-10-CM | POA: Diagnosis not present

## 2019-06-01 DIAGNOSIS — Z882 Allergy status to sulfonamides status: Secondary | ICD-10-CM | POA: Insufficient documentation

## 2019-06-01 DIAGNOSIS — M1712 Unilateral primary osteoarthritis, left knee: Secondary | ICD-10-CM | POA: Insufficient documentation

## 2019-06-01 DIAGNOSIS — I4719 Other supraventricular tachycardia: Secondary | ICD-10-CM | POA: Diagnosis present

## 2019-06-01 DIAGNOSIS — R41841 Cognitive communication deficit: Secondary | ICD-10-CM | POA: Diagnosis not present

## 2019-06-01 DIAGNOSIS — Z8679 Personal history of other diseases of the circulatory system: Secondary | ICD-10-CM | POA: Diagnosis not present

## 2019-06-01 DIAGNOSIS — R4781 Slurred speech: Secondary | ICD-10-CM | POA: Diagnosis not present

## 2019-06-01 DIAGNOSIS — R2689 Other abnormalities of gait and mobility: Secondary | ICD-10-CM | POA: Diagnosis not present

## 2019-06-01 DIAGNOSIS — G459 Transient cerebral ischemic attack, unspecified: Secondary | ICD-10-CM | POA: Diagnosis not present

## 2019-06-01 DIAGNOSIS — M810 Age-related osteoporosis without current pathological fracture: Secondary | ICD-10-CM | POA: Diagnosis not present

## 2019-06-01 DIAGNOSIS — M6281 Muscle weakness (generalized): Secondary | ICD-10-CM | POA: Diagnosis not present

## 2019-06-01 DIAGNOSIS — Z881 Allergy status to other antibiotic agents status: Secondary | ICD-10-CM | POA: Diagnosis not present

## 2019-06-01 HISTORY — DX: Unspecified atrial fibrillation: I48.91

## 2019-06-01 LAB — COMPREHENSIVE METABOLIC PANEL
ALT: 12 U/L (ref 0–44)
AST: 17 U/L (ref 15–41)
Albumin: 3.9 g/dL (ref 3.5–5.0)
Alkaline Phosphatase: 39 U/L (ref 38–126)
Anion gap: 9 (ref 5–15)
BUN: 16 mg/dL (ref 8–23)
CO2: 24 mmol/L (ref 22–32)
Calcium: 9.1 mg/dL (ref 8.9–10.3)
Chloride: 107 mmol/L (ref 98–111)
Creatinine, Ser: 0.85 mg/dL (ref 0.44–1.00)
GFR calc Af Amer: >60 mL/min (ref >60)
GFR calc non Af Amer: 60 mL/min — ABNORMAL LOW (ref >60)
Glucose, Bld: 95 mg/dL (ref 70–99)
Potassium: 4.2 mmol/L (ref 3.5–5.1)
Sodium: 140 mmol/L (ref 135–145)
Total Bilirubin: 1 mg/dL (ref 0.3–1.2)
Total Protein: 6.8 g/dL (ref 6.5–8.1)

## 2019-06-01 LAB — DIFFERENTIAL
Abs Immature Granulocytes: 0.01 10*3/uL (ref 0.00–0.07)
Basophils Absolute: 0.1 10*3/uL (ref 0.0–0.1)
Basophils Relative: 1 %
Eosinophils Absolute: 0.1 10*3/uL (ref 0.0–0.5)
Eosinophils Relative: 2 %
Immature Granulocytes: 0 %
Lymphocytes Relative: 24 %
Lymphs Abs: 1.5 10*3/uL (ref 0.7–4.0)
Monocytes Absolute: 0.6 10*3/uL (ref 0.1–1.0)
Monocytes Relative: 10 %
Neutro Abs: 4.1 10*3/uL (ref 1.7–7.7)
Neutrophils Relative %: 63 %

## 2019-06-01 LAB — PROTIME-INR
INR: 1.8 — ABNORMAL HIGH (ref 0.8–1.2)
Prothrombin Time: 20.4 seconds — ABNORMAL HIGH (ref 11.4–15.2)

## 2019-06-01 LAB — CBC
HCT: 42.4 % (ref 36.0–46.0)
Hemoglobin: 13.9 g/dL (ref 12.0–15.0)
MCH: 29.3 pg (ref 26.0–34.0)
MCHC: 32.8 g/dL (ref 30.0–36.0)
MCV: 89.3 fL (ref 80.0–100.0)
Platelets: 242 10*3/uL (ref 150–400)
RBC: 4.75 MIL/uL (ref 3.87–5.11)
RDW: 12.9 % (ref 11.5–15.5)
WBC: 6.4 10*3/uL (ref 4.0–10.5)
nRBC: 0 % (ref 0.0–0.2)

## 2019-06-01 LAB — APTT: aPTT: 35 seconds (ref 24–36)

## 2019-06-01 MED ORDER — LORAZEPAM 2 MG/ML IJ SOLN
0.5000 mg | Freq: Once | INTRAMUSCULAR | Status: AC
  Administered 2019-06-01: 0.5 mg via INTRAVENOUS
  Filled 2019-06-01: qty 1

## 2019-06-01 MED ORDER — GADOBUTROL 1 MMOL/ML IV SOLN
5.0000 mL | Freq: Once | INTRAVENOUS | Status: AC | PRN
  Administered 2019-06-01: 5 mL via INTRAVENOUS
  Filled 2019-06-01: qty 6

## 2019-06-01 NOTE — Telephone Encounter (Signed)
Noted. Patient is in the ED.

## 2019-06-01 NOTE — ED Notes (Signed)
Pt going to MRI

## 2019-06-01 NOTE — Telephone Encounter (Signed)
Returned [name]'s call per his request.  Spoke with pt's son who said that he was with pt in the ED @ [facility].    Will forward to PCP as FYI.

## 2019-06-01 NOTE — ED Provider Notes (Signed)
[facility] Emergency Department Provider Note   First MD Initiated Contact with Patient 06/01/19 2027     (approximate)  I have reviewed the triage vital signs and the nursing notes.   HISTORY  Chief Complaint No chief complaint on file.   HPI [name] is a 85 y.o. female with below list of previous medical conditions presents to the emergency department secondary to 2 episodes of slurred speech which patient states lasted approximately 45 minutes to 1 hour.  Patient states that first episode occurred yesterday morning and then subsequently this morning.  Patient denies any other symptoms no headache nausea vomiting.  Patient denies any dizziness or lightheadedness.  Patient denies any weakness numbness or gait instability.  Patient denies any visual changes.  Patient denies any chest pain or shortness of breath.        Past Medical History:  Diagnosis Date  . A-fib ([place])   . Allergy   . Breast cancer ([place]) 07/22/2012   T1c, Nx carcinoma left breast, ER 90%, PR 90%, HER-2/neu not over expressed.  . Hypertension   . Insomnia   . Osteoporosis    Prolia 10/2012  . PAT (paroxysmal atrial tachycardia) ([place])    a. Dx 03/2019 - improved w/ beta blocker; b. 03/2019 Echo: EF 55-60%. Impaired relaxation. RVSP 48.23mHg. Mild to mod MR/TR.  .[place]ulmonary [place]  . S/P IVC filter 2009    Patient Active Problem List   Diagnosis Date Noted  . Atrial tachycardia ([place] 04/02/2019  . Postural dizziness with near syncope 03/31/2019  . [place] hemorrhage ([place] 03/21/2019  . Ankle tightness 01/04/2019  . Neck pain 12/13/2018  . Ear pain 12/13/2018  . Subconjunctival hemorrhage of left eye 02/13/2018  . Positional right arm tingling while sleeping 10/01/2017  . Anxiety 12/14/2016  . Nocturia 12/14/2016  . Varicose veins of both lower extremities 09/11/2016  . Rash and nonspecific skin eruption 07/13/2016  . Dry eyes 06/11/2016  . Fall 03/11/2016   . Tremor 01/16/2016  . Seasonal allergic rhinitis 02/28/2015  . Diarrhea 01/15/2015  . Cold intolerance 11/26/2014  . Osteoarthritis of left knee 08/31/2014  . Gait instability 08/31/2014  . Memory loss 08/31/2014  . Seborrheic keratoses 12/28/2012  . Osteopenia 09/22/2012  . History of breast cancer 06/02/2012  . Depression 01/08/2012  . History of pulmonary embolus (PE) 12/03/2011  . Insomnia 05/13/2011  . Hypertension 05/13/2011    Past Surgical History:  Procedure Laterality Date  . BLADDER SURGERY    . BREAST SURGERY Left 2013   mastectomy  . CHOLECYSTECTOMY    . HERNIA REPAIR    . MASTECTOMY Left 2013   BREAST CA  . REPLACEMENT TOTAL KNEE     right  . TONSILLECTOMY      Prior to Admission medications   Medication Sig Start Date End Date Taking? Authorizing Provider  albuterol (PROVENTIL HFA;VENTOLIN HFA) 108 (90 Base) MCG/ACT inhaler Inhale 2 puffs into the lungs every 6 (six) hours as needed for wheezing or shortness of breath. 09/01/18   GJodelle Green FNP  ALPRAZolam (Duanne Moron 0.5 MG tablet Take 0.5 tablets (0.25 mg total) by mouth at bedtime as needed for anxiety or sleep. 05/23/19   SLeone Haven MD  Biotin 10 MG TABS Take 10 mg by mouth daily.     [provider]  Cholecalciferol (VITAMIN D PO) Take 2,000 mg by mouth daily.    [provider]  denosumab (PROLIA) 60 MG/ML SOSY injection Prolia  60 mg/mL subcutaneous syringe  Inject 1 mL by subcutaneous route.    [provider]  diphenoxylate-atropine (LOMOTIL) 2.5-0.025 MG tablet Take 1 tablet by mouth 4 (four) times daily as needed for diarrhea or loose stools. 12/01/17   [name], MD  fluticasone (FLONASE) 50 MCG/ACT nasal spray Place 2 sprays into both nostrils daily. 04/05/19   [name], MD  losartan (COZAAR) 25 MG tablet Take 1 tablet (25 mg total) by mouth daily. 04/19/19   [name], MD  metoprolol tartrate (LOPRESSOR) 50 MG tablet Take 1 tablet (50 mg  total) by mouth 2 (two) times daily. 04/05/19   [name], MD  montelukast (SINGULAIR) 10 MG tablet Take 1 tablet (10 mg total) by mouth at bedtime. 03/20/19   [name], MD  triamcinolone cream (KENALOG) 0.1 % Apply 1 application topically 2 (two) times daily. For up to 7 days. Do not apply to the face. 04/19/19   [name], MD  warfarin (COUMADIN) 1 MG tablet Take 1 mg by mouth on Tuesday and Saturday 02/07/19   [name], MD  warfarin (COUMADIN) 3 MG tablet Take 1 tablet (3 mg total) by mouth daily. Patient taking differently: Take 3 mg by mouth daily. Monday, Wednesday, Friday 02/07/19   [name], MD  XIIDRA 5 % SOLN Place 1-2 drops into both eyes daily.  12/06/17   [provider]    Allergies Erythromycin and Sulfa drugs cross reactors  Family History  Problem Relation Age of Onset  . COPD Mother   . Stroke Father   . Breast cancer Maternal Aunt     Social History Social History   Tobacco Use  . Smoking status: Former Smoker    Types: Cigarettes    Quit date: 05/13/1999    Years since quitting: 20.0  . Smokeless tobacco: Never Used  Substance Use Topics  . Alcohol use: No  . Drug use: No    Review of Systems Constitutional: No fever/chills Eyes: No visual changes. ENT: No sore throat. Cardiovascular: Denies chest pain. Respiratory: Denies shortness of breath. Gastrointestinal: No abdominal pain.  No nausea, no vomiting.  No diarrhea.  No constipation. Genitourinary: Negative for dysuria. Musculoskeletal: Negative for neck pain.  Negative for back pain. Integumentary: Negative for rash. Neurological: Negative for headaches, focal weakness or numbness.  Positive for slurred speech   ____________________________________________   PHYSICAL EXAM:  VITAL SIGNS: ED Triage Vitals  Enc Vitals Group     BP 06/01/19 1649 (!) 164/75     Pulse Rate 06/01/19 1649 70     Resp 06/01/19 1649 16     Temp 06/01/19 1649 97.7  F (36.5 C)     Temp Source 06/01/19 1649 Oral     SpO2 06/01/19 1649 94 %     Weight 06/01/19 1650 56.7 kg (125 lb)     Height 06/01/19 1650 1.524 m (5')     Head Circumference --      Peak Flow --      Pain Score 06/01/19 1650 0     Pain Loc --      Pain Edu? --      Excl. in [place]? --     Constitutional: Alert and oriented.  Eyes: Conjunctivae are normal.  Mouth/Throat: Patient is wearing a mask. Neck: No stridor.  No meningeal signs.   Cardiovascular: Normal rate, regular rhythm. Good peripheral circulation. Grossly normal heart sounds. Respiratory: Normal respiratory effort.  No retractions.  Gastrointestinal: Soft and nontender. No distention.  Musculoskeletal: No lower extremity tenderness nor edema. No gross deformities of extremities. Neurologic:  Normal speech and language. No gross focal neurologic deficits are appreciated.  Skin:  Skin is warm, dry and intact. Psychiatric: Mood and affect are normal. Speech and behavior are normal.  ____________________________________________   LABS (all labs ordered are listed, but only abnormal results are displayed)  Labs Reviewed  PROTIME-INR - Abnormal; Notable for the following components:      Result Value   Prothrombin Time 20.4 (*)    INR 1.8 (*)    All other components within normal limits  COMPREHENSIVE METABOLIC PANEL - Abnormal; Notable for the following components:   GFR calc non Af Amer 60 (*)    All other components within normal limits  APTT  CBC  DIFFERENTIAL   ____________________________________________  EKG  ED ECG REPORT I, [place] N Gentry Pilson, the attending physician, personally viewed and interpreted this ECG.   Date: 06/01/2019  EKG Time: 5:04 PM  Rate: 63  Rhythm: Normal sinus rhythm  Axis: Normal  Intervals: Normal  ST&T Change: None ______________________________________  RADIOLOGY I, Garrett N Arali Somera, personally viewed and evaluated these images (plain radiographs) as part of my medical  decision making, as well as reviewing the written report by the radiologist.  ED MD interpretation: Air-fluid level within the left maxillary and right sphenoid air cells consistent with acute sinusitis.  CT otherwise unremarkable per radiologist.  Official radiology report(s): Ct Head Wo Contrast  Result Date: 06/01/2019 CLINICAL DATA:  Patient c/o slurred speech yesterday morning and this AM. Speech clear. No facial droop. Grips strong and equal. PAtient reports she has been ambulatory with cane at baseline. EXAM: CT HEAD WITHOUT CONTRAST TECHNIQUE: Contiguous axial images were obtained from the base of the skull through the vertex without intravenous contrast. COMPARISON:  03/31/2019 FINDINGS: Brain: There is central and cortical atrophy. Periventricular white matter changes are consistent with small vessel disease. There is no intra or extra-axial fluid collection or mass lesion. The basilar cisterns and ventricles have a normal appearance. There is no CT evidence for acute infarction or hemorrhage. Vascular: There is atherosclerotic calcification of the internal carotid arteries. No hyperdense vessels. Skull: Normal. Negative for fracture or focal lesion. Sinuses/Orbits: Air-fluid level within the LEFT maxillary and RIGHT sphenoid air cells. Mastoid air cells are normally aerated. Other: None. IMPRESSION: 1. Atrophy and small vessel disease. 2. No evidence for acute intracranial abnormality. 3. Air-fluid levels within the LEFT maxillary and RIGHT sphenoid air cells suspicious for acute sinusitis. Electronically Signed   By: Nolon Nations M.D.   On: 06/01/2019 17:37    Procedures   ____________________________________________   INITIAL IMPRESSION / MDM / [place] / ED COURSE  As part of my medical decision making, I reviewed the following data within the electronic MEDICAL RECORD NUMBER  85 year old female presenting with above-stated history and physical exam secondary to symptoms  concerning for possible TIA.  CT revealed no acute evidence of CVA.  MRI ordered.  Given multiple episodes of TIA-like symptoms over the past 24 hours with subtherapeutic INR of 1.8 and known history of atrial fibrillation.  Patient discussed with hospitalist for hospital admission Dr. Trixie Rude.  ____________________________________________  FINAL CLINICAL IMPRESSION(S) / ED DIAGNOSES  Final diagnoses:  TIA (transient ischemic attack)     MEDICATIONS GIVEN DURING THIS VISIT:  Medications  LORazepam (ATIVAN) injection 0.5 mg (has no administration in time range)     ED Discharge Orders  None      *Please note:  [name] was evaluated in Emergency Department on 06/01/2019 for the symptoms described in the history of present illness. She was evaluated in the context of the global COVID-19 pandemic, which necessitated consideration that the patient might be at risk for infection with the SARS-CoV-2 virus that causes COVID-19. Institutional protocols and algorithms that pertain to the evaluation of patients at risk for COVID-19 are in a state of rapid change based on information released by regulatory bodies including the CDC and federal and state organizations. These policies and algorithms were followed during the patient's care in the ED.  Some ED evaluations and interventions may be delayed as a result of limited staffing during the pandemic.*  Note:  This document was prepared using Dragon voice recognition software and may include unintentional dictation errors.   Gregor Hams, MD 06/01/19 2153

## 2019-06-01 NOTE — Telephone Encounter (Signed)
Pt's son, Janat Saunderson, called stating that hes poke with Madie Reno, RN at [facility], and it sounded like the pt was having TIA's since 05/31/2019, they last about 30-45 min (1 on 05/31/2019 at 0900), and (1 on 06/01/2019 at 0900); he says the pt's words "were like mayonnaise in her mouth", and "she couldn't get her thoughts together"; recommendations made per nurse protocol; he verbalized understanding, and will proceed; the pt sees Dr [name], [facility]; her son would like a callback at [phone] (mobile); will route to office for notification.   Reason for Disposition . [1] Loss of speech or garbled speech AND [2] sudden onset AND [3] brief (now gone)  Answer Assessment - Initial Assessment Questions 1. SYMPTOM: "What is the main symptom you are concerned about?" (e.g., weakness, numbness)     Could not get thoughts together; words like mayo in her mouth 2. ONSET: "When did this start?" (minutes, hours, days; while sleeping)    05/31/2019 x 1 episode, and 06/01/2019 x 1 episode 3. LAST NORMAL: "When was the last time you were normal (no symptoms)?"     Pt is now normal 4. PATTERN "Does this come and go, or has it been constant since it started?"  "Is it present now?"    Intermittent; not present now 5. CARDIAC SYMPTOMS: "Have you had any of the following symptoms: chest pain, difficulty breathing, palpitations?"     no 6. NEUROLOGIC SYMPTOMS: "Have you had any of the following symptoms: headache, dizziness, vision loss, double vision, changes in speech, unsteady on your feet?"     no 7. OTHER SYMPTOMS: "Do you have any other symptoms?"     Weakness in both hands at that time 8. PREGNANCY: "Is there any chance you are pregnant?" "When was your last menstrual period?"     no  Protocols used: NEUROLOGIC DEFICIT-A-AH

## 2019-06-01 NOTE — ED Notes (Signed)
Patient transported to CT 

## 2019-06-01 NOTE — ED Triage Notes (Signed)
Patient c/o slurred speech yesterday morning and this AM. Speech clear. No facial droop. Grips strong and equal. PAtient reports she has been ambulatory with cane at baseline. Patient lives in independent living at [place] currently.

## 2019-06-02 DIAGNOSIS — G459 Transient cerebral ischemic attack, unspecified: Secondary | ICD-10-CM | POA: Diagnosis not present

## 2019-06-02 DIAGNOSIS — Z86711 Personal history of pulmonary embolism: Secondary | ICD-10-CM | POA: Diagnosis not present

## 2019-06-02 LAB — LIPID PANEL
Cholesterol: 170 mg/dL (ref 0–200)
HDL: 63 mg/dL (ref >40)
LDL Cholesterol: 89 mg/dL (ref 0–99)
Total CHOL/HDL Ratio: 2.7 RATIO
Triglycerides: 90 mg/dL (ref <150)
VLDL: 18 mg/dL (ref 0–40)

## 2019-06-02 LAB — HEMOGLOBIN A1C
Hgb A1c MFr Bld: 5.5 % (ref 4.8–5.6)
Mean Plasma Glucose: 111.15 mg/dL

## 2019-06-02 LAB — PROTIME-INR
INR: 1.6 — ABNORMAL HIGH (ref 0.8–1.2)
Prothrombin Time: 19.1 seconds — ABNORMAL HIGH (ref 11.4–15.2)

## 2019-06-02 MED ORDER — ALPRAZOLAM 0.5 MG PO TABS
0.2500 mg | ORAL_TABLET | Freq: Every evening | ORAL | Status: DC | PRN
  Administered 2019-06-02: 0.25 mg via ORAL
  Filled 2019-06-02: qty 1

## 2019-06-02 MED ORDER — WARFARIN SODIUM 3 MG PO TABS
3.0000 mg | ORAL_TABLET | ORAL | Status: DC
  Filled 2019-06-02: qty 1

## 2019-06-02 MED ORDER — STROKE: EARLY STAGES OF RECOVERY BOOK: Freq: Once | Status: DC

## 2019-06-02 MED ORDER — ACETAMINOPHEN 650 MG RE SUPP: 650.0000 mg | RECTAL | Status: DC | PRN

## 2019-06-02 MED ORDER — SENNOSIDES-DOCUSATE SODIUM 8.6-50 MG PO TABS: 1.0000 | ORAL_TABLET | Freq: Every evening | ORAL | Status: DC | PRN

## 2019-06-02 MED ORDER — ACETAMINOPHEN 325 MG PO TABS: 650.0000 mg | ORAL_TABLET | ORAL | Status: DC | PRN

## 2019-06-02 MED ORDER — METOPROLOL TARTRATE 50 MG PO TABS
50.0000 mg | ORAL_TABLET | Freq: Two times a day (BID) | ORAL | Status: DC
  Administered 2019-06-02: 50 mg via ORAL
  Filled 2019-06-02: qty 1

## 2019-06-02 MED ORDER — METOPROLOL TARTRATE 50 MG PO TABS: 50.0000 mg | ORAL_TABLET | Freq: Two times a day (BID) | ORAL | Status: DC

## 2019-06-02 MED ORDER — WARFARIN SODIUM 1 MG PO TABS: 1.0000 mg | ORAL_TABLET | ORAL | Status: DC

## 2019-06-02 MED ORDER — ACETAMINOPHEN 160 MG/5ML PO SOLN
650.0000 mg | ORAL | Status: DC | PRN
  Filled 2019-06-02: qty 20.3

## 2019-06-02 NOTE — ED Notes (Signed)
Pt's son verbalized understanding of discharge instructions. NAD at this time. 

## 2019-06-02 NOTE — H&P (Addendum)
History and Physical    [name]:[id number] DOB: 1926-04-27 DOA: 06/01/2019  PCP: [name] Haven, MD  Patient coming from: [name] , son at bedside  I have personally briefly reviewed patient's old medical records in [place]  Chief Complaint: slurred speech  HPI: [name] is a 85 y.o. female with medical history significant of atrial tachycardia, PE s/p IVC filter on Coumadin, history of breast cancer, hypertension, and depression who presents with concerns of slurred speech.  Patient overall is a poor historian and son also was unable to provide much history.  Reportedly from EDP patient yesterday had about 30 minutes to 45 minutes episode of slurred speech and again a similar episode today.  She denies any headache, vision changes.  Denies any chest pain, shortness of breath.  Denies any nausea, vomiting or diarrhea.  Denies any weakness of the extremities. Patient's Coumadin level is subtherapeutic and patient has been reportedly working with hematology to consider removal of IVC filter so that she can be taken off Coumadin but has refused. Also has reportedely refused switching to NOACs in the past.    ED Course: She was afebrile and normotensive on room air.  CBC showed no leukocytosis or anemia.  CMP otherwise unremarkable.  INR of 1.8. CT head showed atrophy and small vessel disease.  There is also air-fluid level within the left maxillary and right sphenoid air cells suspicious for acute sinusitis. MRI brain showed no acute intracranial abnormality.  Review of Systems:  Constitutional: No Weight Change, No Fever ENT/Mouth: No sore throat, No Rhinorrhea Eyes: No Eye Pain, No Vision Changes Cardiovascular: No Chest Pain, no SOB Respiratory: No Cough, No Sputum, No Wheezing, no Dyspnea  Gastrointestinal: No Nausea, No Vomiting, No Diarrhea, No Constipation, No Pain Genitourinary: no Urinary Incontinence, No Urgency, No Flank Pain Musculoskeletal: No  Arthralgias, No Myalgias Skin: No Skin Lesions, No Pruritus, Neuro: no Weakness, No Numbness,  No Loss of Consciousness, No Syncope Psych: No Anxiety/Panic, No Depression, no decrease appetite Heme/Lymph: No Bruising, No Bleeding  Past Medical History:  Diagnosis Date  . A-fib ([place])   . Allergy   . Breast cancer ([place]) 07/22/2012   T1c, Nx carcinoma left breast, ER 90%, PR 90%, HER-2/neu not over expressed.  . Hypertension   . Insomnia   . Osteoporosis    Prolia 10/2012  . PAT (paroxysmal atrial tachycardia) ([place])    a. Dx 03/2019 - improved w/ beta blocker; b. 03/2019 Echo: EF 55-60%. Impaired relaxation. RVSP 48.15mHg. Mild to mod MR/TR.  .[place]  . S/P IVC filter 2009    Past Surgical History:  Procedure Laterality Date  . BLADDER SURGERY    . BREAST SURGERY Left 2013   mastectomy  . CHOLECYSTECTOMY    . HERNIA REPAIR    . MASTECTOMY Left 2013   BREAST CA  . REPLACEMENT TOTAL KNEE     right  . TONSILLECTOMY       reports that she quit smoking about 20 years ago. Her smoking use included cigarettes. She has never used smokeless tobacco. She reports that she does not drink alcohol or use drugs.  Allergies  Allergen Reactions  . Erythromycin   . Sulfa Drugs Cross Reactors     rash    Family History  Problem Relation Age of Onset  . COPD Mother   . Stroke Father   . Breast cancer Maternal Aunt    Family history reviewed and not pertinent   Prior  to Admission medications   Medication Sig Start Date End Date Taking? Authorizing Provider  albuterol (PROVENTIL HFA;VENTOLIN HFA) 108 (90 Base) MCG/ACT inhaler Inhale 2 puffs into the lungs every 6 (six) hours as needed for wheezing or shortness of breath. 09/01/18  Yes Guse, Jacquelynn Cree, FNP  ALPRAZolam Duanne Moron) 0.5 MG tablet Take 0.5 tablets (0.25 mg total) by mouth at bedtime as needed for anxiety or sleep. 05/23/19  Yes [name] Haven, MD  Biotin 10 MG TABS Take 10 mg by mouth daily.    Yes  [provider]  Cholecalciferol (VITAMIN D PO) Take 2,000 mg by mouth daily.   Yes [provider]  denosumab (PROLIA) 60 MG/ML SOSY injection Prolia 60 mg/mL subcutaneous syringe  Inject 1 mL by subcutaneous route.   Yes [provider]  diphenoxylate-atropine (LOMOTIL) 2.5-0.025 MG tablet Take 1 tablet by mouth 4 (four) times daily as needed for diarrhea or loose stools. 12/01/17  Yes [name] Haven, MD  fluticasone (FLONASE) 50 MCG/ACT nasal spray Place 2 sprays into both nostrils daily. 04/05/19  Yes [name] Haven, MD  losartan (COZAAR) 25 MG tablet Take 1 tablet (25 mg total) by mouth daily. 04/19/19  Yes [name] Haven, MD  metoprolol tartrate (LOPRESSOR) 50 MG tablet Take 1 tablet (50 mg total) by mouth 2 (two) times daily. 04/05/19  Yes [name] Haven, MD  montelukast (SINGULAIR) 10 MG tablet Take 1 tablet (10 mg total) by mouth at bedtime. 03/20/19  Yes [name] Haven, MD  warfarin (COUMADIN) 1 MG tablet Take 1 mg by mouth on Tuesday and Saturday 02/07/19  Yes [name] Haven, MD  warfarin (COUMADIN) 3 MG tablet Take 1 tablet (3 mg total) by mouth daily. Patient taking differently: Take 3 mg by mouth daily. Monday, Wednesday, Friday 02/07/19  Yes [name] Haven, MD  XIIDRA 5 % SOLN Place 1-2 drops into both eyes daily.  12/06/17  Yes [provider]  triamcinolone cream (KENALOG) 0.1 % Apply 1 application topically 2 (two) times daily. For up to 7 days. Do not apply to the face. Patient not taking: Reported on 06/01/2019 04/19/19   [name] Haven, MD    Physical Exam: Vitals:   06/01/19 1649 06/01/19 1650 06/02/19 0200  BP: (!) 164/75  130/75  Pulse: 70  77  Resp: 16  18  Temp: 97.7 F (36.5 C)    TempSrc: Oral    SpO2: 94%  93%  Weight:  56.7 kg   Height:  5' (1.524 m)     Constitutional: NAD, calm, comfortable, elderly female laying in bed asleep and appears drowsy Vitals:   06/01/19 1649 06/01/19 1650 06/02/19 0200   BP: (!) 164/75  130/75  Pulse: 70  77  Resp: 16  18  Temp: 97.7 F (36.5 C)    TempSrc: Oral    SpO2: 94%  93%  Weight:  56.7 kg   Height:  5' (1.524 m)    Eyes: PERRL, lids and conjunctivae normal ENMT: Mucous membranes are moist.  Neck: normal, supple, no masses. Respiratory: clear to auscultation bilaterally, no wheezing, no crackles. Normal respiratory effort. No accessory muscle use.  Cardiovascular: Regular rate and rhythm, no murmurs / rubs / gallops. No extremity edema. 2+ pedal pulses. No carotid bruits.  Abdomen: no tenderness, no masses palpated.  Bowel sounds positive.  Musculoskeletal: no clubbing / cyanosis. No joint deformity upper and lower extremities. Good ROM, no contractures. Normal muscle tone.  Skin: no rashes, lesions, ulcers.  No induration Neurologic: CN 2-12 grossly intact. Sensation intact. Strength 5/5 in all 4.  No facial asymmetry.  Intact finger-to-nose. Psychiatric: Normal judgment and insight. Alert and oriented x 3. Normal mood.     Labs on Admission: I have personally reviewed following labs and imaging studies  CBC: Recent Labs  Lab 06/01/19 1706  WBC 6.4  NEUTROABS 4.1  HGB 13.9  HCT 42.4  MCV 89.3  PLT 194   Basic Metabolic Panel: Recent Labs  Lab 06/01/19 1706  NA 140  K 4.2  CL 107  CO2 24  GLUCOSE 95  BUN 16  CREATININE 0.85  CALCIUM 9.1   GFR: Estimated Creatinine Clearance: 34 mL/min (by C-G formula based on SCr of 0.85 mg/dL). Liver Function Tests: Recent Labs  Lab 06/01/19 1706  AST 17  ALT 12  ALKPHOS 39  BILITOT 1.0  PROT 6.8  ALBUMIN 3.9   No results for input(s): LIPASE, AMYLASE in the last 168 hours. No results for input(s): AMMONIA in the last 168 hours. Coagulation Profile: Recent Labs  Lab 06/01/19 1706  INR 1.8*   Cardiac Enzymes: No results for input(s): CKTOTAL, CKMB, CKMBINDEX, TROPONINI in the last 168 hours. BNP (last 3 results) No results for input(s): PROBNP in the last 8760 hours.  HbA1C: No results for input(s): HGBA1C in the last 72 hours. CBG: No results for input(s): GLUCAP in the last 168 hours. Lipid Profile: No results for input(s): CHOL, HDL, LDLCALC, TRIG, CHOLHDL, LDLDIRECT in the last 72 hours. Thyroid Function Tests: No results for input(s): TSH, T4TOTAL, FREET4, T3FREE, THYROIDAB in the last 72 hours. Anemia Panel: No results for input(s): VITAMINB12, FOLATE, FERRITIN, TIBC, IRON, RETICCTPCT in the last 72 hours. Urine analysis:    Component Value Date/Time   COLORURINE YELLOW (A) 03/31/2019 1913   APPEARANCEUR CLEAR (A) 03/31/2019 1913   LABSPEC 1.011 03/31/2019 1913   PHURINE 6.0 03/31/2019 1913   GLUCOSEU NEGATIVE 03/31/2019 1913   HGBUR NEGATIVE 03/31/2019 1913   BILIRUBINUR NEGATIVE 03/31/2019 1913   BILIRUBINUR negative 12/14/2016 [place] 03/31/2019 1913   PROTEINUR NEGATIVE 03/31/2019 1913   UROBILINOGEN 0.2 12/14/2016 1514   NITRITE NEGATIVE 03/31/2019 1913   LEUKOCYTESUR TRACE (A) 03/31/2019 1913    Radiological Exams on Admission: Ct Head Wo Contrast  Result Date: 06/01/2019 CLINICAL DATA:  Patient c/o slurred speech yesterday morning and this AM. Speech clear. No facial droop. Grips strong and equal. PAtient reports she has been ambulatory with cane at baseline. EXAM: CT HEAD WITHOUT CONTRAST TECHNIQUE: Contiguous axial images were obtained from the base of the skull through the vertex without intravenous contrast. COMPARISON:  03/31/2019 FINDINGS: Brain: There is central and cortical atrophy. Periventricular white matter changes are consistent with small vessel disease. There is no intra or extra-axial fluid collection or mass lesion. The basilar cisterns and ventricles have a normal appearance. There is no CT evidence for acute infarction or hemorrhage. Vascular: There is atherosclerotic calcification of the internal carotid arteries. No hyperdense vessels. Skull: Normal. Negative for fracture or focal lesion.  Sinuses/Orbits: Air-fluid level within the LEFT maxillary and RIGHT sphenoid air cells. Mastoid air cells are normally aerated. Other: None. IMPRESSION: 1. Atrophy and small vessel disease. 2. No evidence for acute intracranial abnormality. 3. Air-fluid levels within the LEFT maxillary and RIGHT sphenoid air cells suspicious for acute sinusitis. Electronically Signed   By: Nolon Nations M.D.   On: 06/01/2019 17:37   Mr Jeri Cos And Wo Contrast  Result Date: 06/01/2019  CLINICAL DATA:  Initial evaluation for acute slurred speech. EXAM: MRI HEAD WITHOUT AND WITH CONTRAST TECHNIQUE: Multiplanar, multiecho pulse sequences of the brain and surrounding structures were obtained without and with intravenous contrast. CONTRAST:  78m GADAVIST GADOBUTROL 1 MMOL/ML IV SOLN COMPARISON:  Prior head CT from earlier the same day. FINDINGS: Brain: Examination moderately degraded by motion artifact. Diffuse prominence of the CSF containing spaces compatible with generalized age-related cerebral atrophy. Patchy and confluent T2/FLAIR hyperintensity within the periventricular deep white matter both cerebral hemispheres most compatible chronic microvascular ischemic disease, moderate in nature. Superimposed remote lacunar infarct present within the right pons with associated chronic hemosiderin staining. No abnormal foci of restricted diffusion to suggest acute or subacute ischemia. Gray-white matter differentiation maintained. No encephalomalacia to suggest chronic cortical infarction. Apparent punctate focus of diffusion abnormality involving the left paramedian splenium on axial DWI image 27 not seen on corresponding sequences, and is favored to be artifactual nature. Additional single punctate chronic microhemorrhage noted within the right parieto-occipital region. No evidence for acute intracranial hemorrhage. No mass lesion, midline shift or mass effect no hydrocephalus. No extra-axial fluid collection. Pituitary gland within  normal limits. No abnormal enhancement. Vascular: Major intracranial vascular flow voids are grossly maintained at the skull base. Skull and upper cervical spine: Craniocervical junction within normal limits. Upper cervical spine unremarkable. Bone marrow signal intensity within normal limits. Hyperostosis frontalis interna noted. No scalp soft tissue abnormality. Sinuses/Orbits: Patient status post bilateral ocular lens replacement. Air-fluid level noted within the left maxillary sinus. Paranasal sinuses are otherwise clear. No mastoid effusion. Other: None. IMPRESSION: 1. No acute intracranial abnormality. 2. Age-related cerebral atrophy with moderate chronic microvascular ischemic disease. 3. Acute left maxillary sinusitis. Electronically Signed   By: BJeannine BogaM.D.   On: 06/01/2019 22:38    EKG: Independently reviewed.  Assessment/Plan Dysarthria likely secondary to TIA in the setting of subtherapeutic INR -Negative findings on CT head and MRI brain -Recently had echocardiogram on 9/6 with EF of 55 to 60%.  Will hold off on repeat. -Also had a recent MRA back in August with no significant intracranial findings. - will consult neurology in the morning  -Obtain A1c and lipids -PT/OT/SLT -Frequent neuro checks and keep on telemetry -Allow for permissive hypertension with blood pressure treatment as needed only if systolic goes above 2429 Sinusitis? - CT shows sinusitis but pt is asymptomatic - will hold off on antibiotic  Atrial tachycardia -Normal sinus rhythm on admission - continue metoprolol  History of pulmonary embolism s/p IVC -continue Coumadin per pharmacy   Subtherapeutic INR -Continue Coumadin per pharmacy  DVT prophylaxis:Coumadin Code Status: Full Family Communication: Plan discussed with patient and son at bedside  disposition Plan: Home with observation Consults called: Neurology Admission status: Observation   Fatina Sprankle T Laquashia Mergenthaler DO [facility]   If  7PM-7AM, please contact night-coverage [email or web address] Password [email or web address]  06/02/2019, 2:51 AM

## 2019-06-02 NOTE — ED Notes (Signed)
Admitting provider at bedside.

## 2019-06-02 NOTE — Progress Notes (Signed)
OT Cancellation Note  Patient Details Name: [name] MRN: [id number] DOB: 07-18-1926   Cancelled Treatment:    Reason Eval/Treat Not Completed: OT screened, no needs identified, will sign off. Thank you for the OT consult. Order received and chart reviewed. Upon arrival to pt room, pt with MD in room discussing plans for DC. Per Dr. Pietro Cassis, pt will be discharging home to [facility] where she currently resides and receives therapy on a regular basis. Pt reporting symptoms have resolved, and feels back to her baseline level of function. No acute OT needs identified.Will sign off at this time. Recommend pt continue with her home-based therapy routine upon leaving the ED.   Shara Blazing, M.S., OTR/L Ascom: [phone] 06/02/19, 1:57 PM

## 2019-06-02 NOTE — Discharge Summary (Signed)
Physician Discharge Summary  [name] [id number] DOB: 1926/05/19 DOA: 06/01/2019  PCP: [name], MD  Admit date: 06/01/2019 Discharge date: 06/02/2019  Admitted From: Home Discharge disposition: Home   Code Status: Full Code  Diet Recommendation: Cardiac diet   Recommendations for Outpatient Follow-Up:   1. Continue to follow-up with PCP as an outpatient. 2. Consider switching from Coumadin to NOAC.   Discharge Diagnosis:   Principal Problem:   History of pulmonary embolus (PE) Active Problems:   Atrial tachycardia (HCC)   TIA (transient ischemic attack)    History of Present Illness / Brief narrative:  [name] is a 85 y.o. female with medical history significant of atrial tachycardia, PE s/p IVC filter on Coumadin, history of breast cancer, hypertension, and depression who presented to the ED on 11/5 with concerns of slurred speech.  Patient overall is a poor historian and son at bedside was not able to provide much history.  11/4, patient had about 30 minutes to 45 minutes episode of slurred speech and again a similar episode on 11/5. Patient's Coumadin level is subtherapeutic and patient has been reportedly working with hematology to consider removal of IVC filter so that she can be taken off Coumadin but patient continues to refuse to do so. Also has reportedely refused switching to NOACs in the past.   She was afebrile and normotensive on room air. CBC showed no leukocytosis or anemia.  CMP otherwise unremarkable.  INR of 1.8. CT head showed atrophy and small vessel disease.  There is also air-fluid level within the left maxillary and right sphenoid air cells suspicious for acute sinusitis. MRI brain showed no acute intracranial abnormality.   Hospital Course:  Dysarthria likely secondary to TIA in the setting of subtherapeutic INR -Negative findings on CT head and MRI brain -Recently had echocardiogram on 9/6 with EF of 55 to 60%.  Did not  repeat echocardiogram this admission. -In August 2020, patient had MRA head and neck which did not show any significant intracranial findings.  -Lipid panel normal.  A1c 5.5. -At the time of my evaluation this afternoon, patient feels improved and wants to go home.  Her son at bedside is also willing to take her home.  Patient and son do not think she needs PT evaluation at this time.  Patient already has physical therapist coming at home.  Sinusitis - CT shows sinusitis but pt is asymptomatic - will hold off on antibiotic  Atrial tachycardia -Normal sinus rhythm on admission -continue metoprolol  History of pulmonary embolism s/p IVC -continue Coumadin per pharmacy  -[facility] INR 2-3.  Subtherapeutic on this admission.  Stable for discharge to home today.  Subjective:  Seen and examined this afternoon in the ED.  Not in distress.  No new symptoms.  Discharge Exam:   Vitals:   06/02/19 0200 06/02/19 0530 06/02/19 0857 06/02/19 0940  BP: 130/75 121/60 136/72 (!) 154/91  Pulse: 77 (!) 56 77 90  Resp: 18 16 16 16   Temp:      TempSrc:      SpO2: 93% 98% 97% 94%  Weight:      Height:        Body mass index is 24.41 kg/m.  General exam: Appears calm and comfortable.  Skin: No rashes, lesions or ulcers. HEENT: Atraumatic, normocephalic, supple neck, no obvious bleeding Lungs: Clear to auscultation bilaterally CVS: Regular rate and rhythm, no murmur GI/Abd soft, nontender, nondistended, bowel sound present CNS: Alert, awake, oriented to place and  person.  At baseline per son at bedside.  No dysarthria noted at this time Psychiatry: Mood appropriate Extremities: No pedal edema, no calf tenderness  Discharge Instructions:  Wound care: None Discharge Instructions    Increase activity slowly   Complete by: As directed       Allergies as of 06/02/2019      Reactions   Erythromycin    Sulfa Drugs Cross Reactors    rash      Medication List    TAKE these medications    albuterol 108 (90 Base) MCG/ACT inhaler Commonly known as: VENTOLIN HFA Inhale 2 puffs into the lungs every 6 (six) hours as needed for wheezing or shortness of breath.   ALPRAZolam 0.5 MG tablet Commonly known as: XANAX Take 0.5 tablets (0.25 mg total) by mouth at bedtime as needed for anxiety or sleep.   Biotin 10 MG Tabs Take 10 mg by mouth daily.   diphenoxylate-atropine 2.5-0.025 MG tablet Commonly known as: Lomotil Take 1 tablet by mouth 4 (four) times daily as needed for diarrhea or loose stools.   fluticasone 50 MCG/ACT nasal spray Commonly known as: FLONASE Place 2 sprays into both nostrils daily.   losartan 25 MG tablet Commonly known as: COZAAR Take 1 tablet (25 mg total) by mouth daily.   metoprolol tartrate 50 MG tablet Commonly known as: LOPRESSOR Take 1 tablet (50 mg total) by mouth 2 (two) times daily.   montelukast 10 MG tablet Commonly known as: SINGULAIR Take 1 tablet (10 mg total) by mouth at bedtime.   Prolia 60 MG/ML Sosy injection Generic drug: denosumab Prolia 60 mg/mL subcutaneous syringe  Inject 1 mL by subcutaneous route.   triamcinolone cream 0.1 % Commonly known as: KENALOG Apply 1 application topically 2 (two) times daily. For up to 7 days. Do not apply to the face.   VITAMIN D PO Take 2,000 mg by mouth daily.   warfarin 3 MG tablet Commonly known as: COUMADIN Take 1 tablet (3 mg total) by mouth daily. What changed: additional instructions   warfarin 1 MG tablet Commonly known as: Coumadin Take 1 mg by mouth on Tuesday and Saturday What changed: Another medication with the same name was changed. Make sure you understand how and when to take each.   Xiidra 5 % Soln Generic drug: Lifitegrast Place 1-2 drops into both eyes daily.       Time coordinating discharge: 25 minutes  The results of significant diagnostics from this hospitalization (including imaging, microbiology, ancillary and laboratory) are listed below for  reference.    Procedures and Diagnostic Studies:   Ct Head Wo Contrast  Result Date: 06/01/2019 CLINICAL DATA:  Patient c/o slurred speech yesterday morning and this AM. Speech clear. No facial droop. Grips strong and equal. PAtient reports she has been ambulatory with cane at baseline. EXAM: CT HEAD WITHOUT CONTRAST TECHNIQUE: Contiguous axial images were obtained from the base of the skull through the vertex without intravenous contrast. COMPARISON:  03/31/2019 FINDINGS: Brain: There is central and cortical atrophy. Periventricular white matter changes are consistent with small vessel disease. There is no intra or extra-axial fluid collection or mass lesion. The basilar cisterns and ventricles have a normal appearance. There is no CT evidence for acute infarction or hemorrhage. Vascular: There is atherosclerotic calcification of the internal carotid arteries. No hyperdense vessels. Skull: Normal. Negative for fracture or focal lesion. Sinuses/Orbits: Air-fluid level within the LEFT maxillary and RIGHT sphenoid air cells. Mastoid air cells are normally aerated. Other: None.  IMPRESSION: 1. Atrophy and small vessel disease. 2. No evidence for acute intracranial abnormality. 3. Air-fluid levels within the LEFT maxillary and RIGHT sphenoid air cells suspicious for acute sinusitis. Electronically Signed   By: Nolon Nations M.D.   On: 06/01/2019 17:37   Mr Jeri Cos And Wo Contrast  Result Date: 06/01/2019 CLINICAL DATA:  Initial evaluation for acute slurred speech. EXAM: MRI HEAD WITHOUT AND WITH CONTRAST TECHNIQUE: Multiplanar, multiecho pulse sequences of the brain and surrounding structures were obtained without and with intravenous contrast. CONTRAST:  4mL GADAVIST GADOBUTROL 1 MMOL/ML IV SOLN COMPARISON:  Prior head CT from earlier the same day. FINDINGS: Brain: Examination moderately degraded by motion artifact. Diffuse prominence of the CSF containing spaces compatible with generalized age-related  cerebral atrophy. Patchy and confluent T2/FLAIR hyperintensity within the periventricular deep white matter both cerebral hemispheres most compatible chronic microvascular ischemic disease, moderate in nature. Superimposed remote lacunar infarct present within the right pons with associated chronic hemosiderin staining. No abnormal foci of restricted diffusion to suggest acute or subacute ischemia. Gray-white matter differentiation maintained. No encephalomalacia to suggest chronic cortical infarction. Apparent punctate focus of diffusion abnormality involving the left paramedian splenium on axial DWI image 27 not seen on corresponding sequences, and is favored to be artifactual nature. Additional single punctate chronic microhemorrhage noted within the right parieto-occipital region. No evidence for acute intracranial hemorrhage. No mass lesion, midline shift or mass effect no hydrocephalus. No extra-axial fluid collection. Pituitary gland within normal limits. No abnormal enhancement. Vascular: Major intracranial vascular flow voids are grossly maintained at the skull base. Skull and upper cervical spine: Craniocervical junction within normal limits. Upper cervical spine unremarkable. Bone marrow signal intensity within normal limits. Hyperostosis frontalis interna noted. No scalp soft tissue abnormality. Sinuses/Orbits: Patient status post bilateral ocular lens replacement. Air-fluid level noted within the left maxillary sinus. Paranasal sinuses are otherwise clear. No mastoid effusion. Other: None. IMPRESSION: 1. No acute intracranial abnormality. 2. Age-related cerebral atrophy with moderate chronic microvascular ischemic disease. 3. Acute left maxillary sinusitis. Electronically Signed   By: Jeannine Boga M.D.   On: 06/01/2019 22:38     Labs:   Basic Metabolic Panel: Recent Labs  Lab 06/01/19 1706  NA 140  K 4.2  CL 107  CO2 24  GLUCOSE 95  BUN 16  CREATININE 0.85  CALCIUM 9.1    GFR Estimated Creatinine Clearance: 34 mL/min (by C-G formula based on SCr of 0.85 mg/dL). Liver Function Tests: Recent Labs  Lab 06/01/19 1706  AST 17  ALT 12  ALKPHOS 39  BILITOT 1.0  PROT 6.8  ALBUMIN 3.9   No results for input(s): LIPASE, AMYLASE in the last 168 hours. No results for input(s): AMMONIA in the last 168 hours. Coagulation profile Recent Labs  Lab 06/01/19 1706 06/02/19 0525  INR 1.8* 1.6*    CBC: Recent Labs  Lab 06/01/19 1706  WBC 6.4  NEUTROABS 4.1  HGB 13.9  HCT 42.4  MCV 89.3  PLT 242   Cardiac Enzymes: No results for input(s): CKTOTAL, CKMB, CKMBINDEX, TROPONINI in the last 168 hours. BNP: Invalid input(s): POCBNP CBG: No results for input(s): GLUCAP in the last 168 hours. D-Dimer No results for input(s): DDIMER in the last 72 hours. Hgb A1c Recent Labs    06/02/19 0525  HGBA1C 5.5   Lipid Profile Recent Labs    06/02/19 0525  CHOL 170  HDL 63  LDLCALC 89  TRIG 90  CHOLHDL 2.7   Thyroid function studies No results  for input(s): TSH, T4TOTAL, T3FREE, THYROIDAB in the last 72 hours.  Invalid input(s): FREET3 Anemia work up No results for input(s): VITAMINB12, FOLATE, FERRITIN, TIBC, IRON, RETICCTPCT in the last 72 hours. Microbiology No results found for this or any previous visit (from the past 240 hour(s)).  Please note: You were cared for by a hospitalist during your hospital stay. Once you are discharged, your primary care physician will handle any further medical issues. Please note that NO REFILLS for any discharge medications will be authorized once you are discharged, as it is imperative that you return to your primary care physician (or establish a relationship with a primary care physician if you do not have one) for your post hospital discharge needs so that they can reassess your need for medications and monitor your lab values.  Signed: Terrilee Croak  [facility] 06/02/2019, 1:55 PM

## 2019-06-02 NOTE — Progress Notes (Signed)
ANTICOAGULATION CONSULT NOTE - Initial Consult  Pharmacy Consult for warfarin Indication: VTE prophylaxis h/o of PE w/ IVC filter  Allergies  Allergen Reactions  . Erythromycin   . Sulfa Drugs Cross Reactors     rash    Patient Measurements: Height: 5' (152.4 cm) Weight: 125 lb (56.7 kg) IBW/kg (Calculated) : 45.5  Vital Signs: BP: 121/60 (11/06 0530) Pulse Rate: 56 (11/06 0530)  Labs: Recent Labs    06/01/19 1706 06/02/19 0525  HGB 13.9  --   HCT 42.4  --   PLT 242  --   APTT 35  --   LABPROT 20.4* 19.1*  INR 1.8* 1.6*  CREATININE 0.85  --     Estimated Creatinine Clearance: 34 mL/min (by C-G formula based on SCr of 0.85 mg/dL).   Medical History: Past Medical History:  Diagnosis Date  . A-fib ([place])   . Allergy   . Breast cancer ([place]) 07/22/2012   T1c, Nx carcinoma left breast, ER 90%, PR 90%, HER-2/neu not over expressed.  . Hypertension   . Insomnia   . Osteoporosis    Prolia 10/2012  . PAT (paroxysmal atrial tachycardia) ([place])    a. Dx 03/2019 - improved w/ beta blocker; b. 03/2019 Echo: EF 55-60%. Impaired relaxation. RVSP 48.67mHg. Mild to mod MR/TR.  .[place]  . S/P IVC filter 2009    Medications:  Scheduled:  .  stroke: mapping our early stages of recovery book   Does not apply Once  . metoprolol tartrate  50 mg Oral BID  . [START ON 06/03/2019] warfarin  1 mg Oral Once per day on Tue Sat  . warfarin  3 mg Oral Q M,W,F-1800    Assessment: Patient admitted for slurred speech w/ h/p PE and IVC filter anticoagulated w/ warfarin PTA: Warfarin 3 mg MWF Warfarin 1 mg Tues Saturday  11/05 INR 1.8 11/06 INR 1.6  Goal of Therapy:  INR 2-3 Monitor platelets by anticoagulation protocol: Yes   Plan:  Will continue patient's home dose as above. Will monitor daily CBC's and adjust per INR trends  DTobie Lords PharmD, BCPS Clinical Pharmacist 06/02/2019,7:25 AM

## 2019-06-05 ENCOUNTER — Telehealth: Payer: Self-pay

## 2019-06-05 NOTE — Telephone Encounter (Signed)
Called patient to follow up with transitional care management. Declines hospital follow up at this time. Denies dizziness, chest pain, numbness, weakness, slurred speech. Wears life alert for safety and monitoring her input/output of fluid. Agrees to call if needed office or ED if needed.

## 2019-06-07 DIAGNOSIS — R2689 Other abnormalities of gait and mobility: Secondary | ICD-10-CM | POA: Diagnosis not present

## 2019-06-07 DIAGNOSIS — M5412 Radiculopathy, cervical region: Secondary | ICD-10-CM | POA: Diagnosis not present

## 2019-06-07 DIAGNOSIS — R41841 Cognitive communication deficit: Secondary | ICD-10-CM | POA: Diagnosis not present

## 2019-06-07 DIAGNOSIS — M6281 Muscle weakness (generalized): Secondary | ICD-10-CM | POA: Diagnosis not present

## 2019-06-07 DIAGNOSIS — R279 Unspecified lack of coordination: Secondary | ICD-10-CM | POA: Diagnosis not present

## 2019-06-08 DIAGNOSIS — R2689 Other abnormalities of gait and mobility: Secondary | ICD-10-CM | POA: Diagnosis not present

## 2019-06-08 DIAGNOSIS — M5412 Radiculopathy, cervical region: Secondary | ICD-10-CM | POA: Diagnosis not present

## 2019-06-08 DIAGNOSIS — M6281 Muscle weakness (generalized): Secondary | ICD-10-CM | POA: Diagnosis not present

## 2019-06-08 DIAGNOSIS — R41841 Cognitive communication deficit: Secondary | ICD-10-CM | POA: Diagnosis not present

## 2019-06-08 DIAGNOSIS — R279 Unspecified lack of coordination: Secondary | ICD-10-CM | POA: Diagnosis not present

## 2019-06-08 DIAGNOSIS — H35373 Puckering of macula, bilateral: Secondary | ICD-10-CM | POA: Diagnosis not present

## 2019-06-13 DIAGNOSIS — R279 Unspecified lack of coordination: Secondary | ICD-10-CM | POA: Diagnosis not present

## 2019-06-13 DIAGNOSIS — M6281 Muscle weakness (generalized): Secondary | ICD-10-CM | POA: Diagnosis not present

## 2019-06-13 DIAGNOSIS — R2689 Other abnormalities of gait and mobility: Secondary | ICD-10-CM | POA: Diagnosis not present

## 2019-06-13 DIAGNOSIS — M5412 Radiculopathy, cervical region: Secondary | ICD-10-CM | POA: Diagnosis not present

## 2019-06-13 DIAGNOSIS — R41841 Cognitive communication deficit: Secondary | ICD-10-CM | POA: Diagnosis not present

## 2019-06-14 DIAGNOSIS — R2689 Other abnormalities of gait and mobility: Secondary | ICD-10-CM | POA: Diagnosis not present

## 2019-06-14 DIAGNOSIS — R279 Unspecified lack of coordination: Secondary | ICD-10-CM | POA: Diagnosis not present

## 2019-06-14 DIAGNOSIS — R41841 Cognitive communication deficit: Secondary | ICD-10-CM | POA: Diagnosis not present

## 2019-06-14 DIAGNOSIS — M5412 Radiculopathy, cervical region: Secondary | ICD-10-CM | POA: Diagnosis not present

## 2019-06-14 DIAGNOSIS — M6281 Muscle weakness (generalized): Secondary | ICD-10-CM | POA: Diagnosis not present

## 2019-06-15 DIAGNOSIS — R279 Unspecified lack of coordination: Secondary | ICD-10-CM | POA: Diagnosis not present

## 2019-06-15 DIAGNOSIS — M5412 Radiculopathy, cervical region: Secondary | ICD-10-CM | POA: Diagnosis not present

## 2019-06-15 DIAGNOSIS — R2689 Other abnormalities of gait and mobility: Secondary | ICD-10-CM | POA: Diagnosis not present

## 2019-06-15 DIAGNOSIS — M6281 Muscle weakness (generalized): Secondary | ICD-10-CM | POA: Diagnosis not present

## 2019-06-15 DIAGNOSIS — R41841 Cognitive communication deficit: Secondary | ICD-10-CM | POA: Diagnosis not present

## 2019-06-16 DIAGNOSIS — Z7901 Long term (current) use of anticoagulants: Secondary | ICD-10-CM | POA: Diagnosis not present

## 2019-06-16 LAB — POCT INR: INR: 1.6 — AB (ref 0.9–1.1)

## 2019-06-16 LAB — PROTIME-INR: Protime: 16.5 — AB (ref 10.0–13.8)

## 2019-06-19 ENCOUNTER — Telehealth: Payer: Self-pay

## 2019-06-19 NOTE — Telephone Encounter (Signed)
If this is the correct coumadin dose (3mg  on Monday, Wednesday and Friday) and 1mg  Tuesday and Saturday , then have them change her to 3mg  on Monday, Wednesday and Friday and 1mg  all other days.  Recheck pt/inr in one week to confirm increasing

## 2019-06-19 NOTE — Telephone Encounter (Addendum)
Called the patient and she states she only notice the swelling upon waking up not  throughout the day,  No other symptoms.  she states her medication is in a pill pack. I called [facility] and left  a message that I would call back.  I do not have another way of reaching the nurse.  Hamed Debella,cma

## 2019-06-19 NOTE — Telephone Encounter (Signed)
INR not at goal.  Need to confirm dose. (so is she not taking any coumadin on Thursday and Sunday?).  Also mentions ankles swelling.  Is this worse end of day better in am.  Any other symptoms?  Any redness or unilateral swelling?

## 2019-06-20 DIAGNOSIS — R279 Unspecified lack of coordination: Secondary | ICD-10-CM | POA: Diagnosis not present

## 2019-06-20 DIAGNOSIS — R2689 Other abnormalities of gait and mobility: Secondary | ICD-10-CM | POA: Diagnosis not present

## 2019-06-20 DIAGNOSIS — R41841 Cognitive communication deficit: Secondary | ICD-10-CM | POA: Diagnosis not present

## 2019-06-20 DIAGNOSIS — M5412 Radiculopathy, cervical region: Secondary | ICD-10-CM | POA: Diagnosis not present

## 2019-06-20 DIAGNOSIS — M6281 Muscle weakness (generalized): Secondary | ICD-10-CM | POA: Diagnosis not present

## 2019-06-20 NOTE — Telephone Encounter (Signed)
I called to facility and spoke with Langley Gauss and informed her of the coumadin dose change of 3 mg tablets on other days, I also informed her to check the patients pt/inr in one week.  Gerrianne Aydelott,cma

## 2019-06-20 NOTE — Telephone Encounter (Signed)
I spoke with Iantha Fallen and he confirmed  the patients coumadin pill pack, she takes 3 mg daily and 1 mg on Tuesday and Saturday and the patient recently changed pharmacy to [facility].  Nakayla Rorabaugh,cma

## 2019-06-20 NOTE — Telephone Encounter (Signed)
I spoke with [name] and he confirmed  the patients coumadin pill pack, she takes 3 mg daily and 1 mg on Tuesday and Saturday and the patient recently changed pharmacy to [facility].  Rosalyn Archambault,cma

## 2019-06-20 NOTE — Telephone Encounter (Signed)
See below for coumadin dose change.  I would recommend - coumadin 3mg  on Monday, Wednesday and Friday and 1mg  all other days.  Recheck pt/inr in one week.

## 2019-06-21 DIAGNOSIS — R41841 Cognitive communication deficit: Secondary | ICD-10-CM | POA: Diagnosis not present

## 2019-06-21 DIAGNOSIS — R2689 Other abnormalities of gait and mobility: Secondary | ICD-10-CM | POA: Diagnosis not present

## 2019-06-21 DIAGNOSIS — M6281 Muscle weakness (generalized): Secondary | ICD-10-CM | POA: Diagnosis not present

## 2019-06-21 DIAGNOSIS — M5412 Radiculopathy, cervical region: Secondary | ICD-10-CM | POA: Diagnosis not present

## 2019-06-21 DIAGNOSIS — R279 Unspecified lack of coordination: Secondary | ICD-10-CM | POA: Diagnosis not present

## 2019-06-23 ENCOUNTER — Other Ambulatory Visit: Payer: Self-pay | Admitting: Family Medicine

## 2019-06-26 NOTE — Telephone Encounter (Signed)
Refill is not due until 07/23/19.

## 2019-06-26 NOTE — Telephone Encounter (Signed)
Refilled: 05/23/2019 Last OV: 04/19/2019 Next OV: 07/11/2020

## 2019-06-27 ENCOUNTER — Telehealth: Payer: Self-pay | Admitting: Family Medicine

## 2019-06-27 ENCOUNTER — Telehealth: Payer: Self-pay

## 2019-06-27 DIAGNOSIS — R2689 Other abnormalities of gait and mobility: Secondary | ICD-10-CM | POA: Diagnosis not present

## 2019-06-27 DIAGNOSIS — M6281 Muscle weakness (generalized): Secondary | ICD-10-CM | POA: Diagnosis not present

## 2019-06-27 DIAGNOSIS — R279 Unspecified lack of coordination: Secondary | ICD-10-CM | POA: Diagnosis not present

## 2019-06-27 DIAGNOSIS — M5412 Radiculopathy, cervical region: Secondary | ICD-10-CM | POA: Diagnosis not present

## 2019-06-27 DIAGNOSIS — R41841 Cognitive communication deficit: Secondary | ICD-10-CM | POA: Diagnosis not present

## 2019-06-27 NOTE — Telephone Encounter (Signed)
[facility] requesting refills for losartan, the losartan Rx was sent to [facility] with refills left, I called the RN Madie Reno @ [facility] where the patient resides and he stated they were no longer using [facility] and want to have all her medications sent to [facility].  I called [facility] and spoke with Lattie Haw and she stated she would call to [facility] and transfer all the patients medications to them for delivery.  Jimmy Stipes,cma

## 2019-06-27 NOTE — Telephone Encounter (Signed)
This is a follow-up of the patients previous dose change of coumadin. Please call her facility and find out what her current dosage is. It looks like she was previously on coumadin 1 mg on Tuesday and Saturday and coumadin 3 mg the other days of the week. Please confirm her current dosing and see if they have checked her INR yet. Thanks.

## 2019-06-28 ENCOUNTER — Telehealth: Payer: Self-pay

## 2019-06-28 DIAGNOSIS — R2689 Other abnormalities of gait and mobility: Secondary | ICD-10-CM | POA: Diagnosis not present

## 2019-06-28 DIAGNOSIS — R41841 Cognitive communication deficit: Secondary | ICD-10-CM | POA: Diagnosis not present

## 2019-06-28 DIAGNOSIS — M6281 Muscle weakness (generalized): Secondary | ICD-10-CM | POA: Diagnosis not present

## 2019-06-28 DIAGNOSIS — R279 Unspecified lack of coordination: Secondary | ICD-10-CM | POA: Diagnosis not present

## 2019-06-28 DIAGNOSIS — M5412 Radiculopathy, cervical region: Secondary | ICD-10-CM | POA: Diagnosis not present

## 2019-06-28 NOTE — Telephone Encounter (Signed)
See other phone note

## 2019-06-28 NOTE — Telephone Encounter (Signed)
I am waiting on a call fro [name] to confirm but this is the dosage Dr. Nicki Reaper changed it to last week:   See below for coumadin dose change.  I would recommend - coumadin 3mg  on Monday, Wednesday and Friday and 1mg  all other days.  Recheck pt/inr in one week.  This is what I called and gave to [place] last week.  Nina,cma .

## 2019-06-28 NOTE — Telephone Encounter (Signed)
Noted.  This is correct.  Please make sure they are going to check an INR this week.  Thanks.

## 2019-06-29 DIAGNOSIS — R279 Unspecified lack of coordination: Secondary | ICD-10-CM | POA: Diagnosis not present

## 2019-06-29 DIAGNOSIS — R41841 Cognitive communication deficit: Secondary | ICD-10-CM | POA: Diagnosis not present

## 2019-06-29 DIAGNOSIS — R2689 Other abnormalities of gait and mobility: Secondary | ICD-10-CM | POA: Diagnosis not present

## 2019-06-29 DIAGNOSIS — M6281 Muscle weakness (generalized): Secondary | ICD-10-CM | POA: Diagnosis not present

## 2019-06-29 DIAGNOSIS — M5412 Radiculopathy, cervical region: Secondary | ICD-10-CM | POA: Diagnosis not present

## 2019-07-03 ENCOUNTER — Other Ambulatory Visit: Payer: Self-pay

## 2019-07-03 ENCOUNTER — Telehealth: Payer: Self-pay | Admitting: Family Medicine

## 2019-07-03 MED ORDER — METOPROLOL TARTRATE 50 MG PO TABS: 50.0000 mg | ORAL_TABLET | Freq: Two times a day (BID) | ORAL | 2 refills | Status: DC

## 2019-07-03 MED ORDER — WARFARIN SODIUM 1 MG PO TABS: ORAL_TABLET | ORAL | 3 refills | Status: DC

## 2019-07-03 MED ORDER — WARFARIN SODIUM 3 MG PO TABS: 3.0000 mg | ORAL_TABLET | Freq: Every day | ORAL | 3 refills | Status: DC

## 2019-07-03 NOTE — Telephone Encounter (Signed)
Son requesting 90 day supply metoprolol tartrate (LOPRESSOR) 50 MG tablet , warfarin (COUMADIN) 1 MG tablet ,warfarin (COUMADIN) 3 MG tablet , informed please allow 48 to 72 hour turn around time. son requesting to speak with nurse to receive clarity regarding double ordering medication thru mail order and PCP  [facility] [place], [place]

## 2019-07-03 NOTE — Telephone Encounter (Signed)
I also have not seen the form you are asking about.

## 2019-07-03 NOTE — Telephone Encounter (Signed)
We have completed a virtual phone visit previously. I would prefer that it be virtual, though if she would like it in office she would need to pass the screening questions.

## 2019-07-03 NOTE — Telephone Encounter (Signed)
Sent to pharmacy 

## 2019-07-04 DIAGNOSIS — M6281 Muscle weakness (generalized): Secondary | ICD-10-CM | POA: Diagnosis not present

## 2019-07-04 DIAGNOSIS — R2689 Other abnormalities of gait and mobility: Secondary | ICD-10-CM | POA: Diagnosis not present

## 2019-07-04 DIAGNOSIS — R279 Unspecified lack of coordination: Secondary | ICD-10-CM | POA: Diagnosis not present

## 2019-07-04 DIAGNOSIS — M5412 Radiculopathy, cervical region: Secondary | ICD-10-CM | POA: Diagnosis not present

## 2019-07-04 DIAGNOSIS — R41841 Cognitive communication deficit: Secondary | ICD-10-CM | POA: Diagnosis not present

## 2019-07-05 NOTE — Telephone Encounter (Signed)
Lm of patient's son cell that virtual visit is preferred since this has been done before. I advised that if it had to be in person that his mom as well as him if he came with her needed to be screened heavily before coming in.

## 2019-07-10 ENCOUNTER — Other Ambulatory Visit: Payer: Self-pay

## 2019-07-10 MED ORDER — LOSARTAN POTASSIUM 25 MG PO TABS: 25.0000 mg | ORAL_TABLET | Freq: Every day | ORAL | 0 refills | Status: DC

## 2019-07-11 DIAGNOSIS — M6281 Muscle weakness (generalized): Secondary | ICD-10-CM | POA: Diagnosis not present

## 2019-07-11 DIAGNOSIS — R279 Unspecified lack of coordination: Secondary | ICD-10-CM | POA: Diagnosis not present

## 2019-07-11 DIAGNOSIS — R2689 Other abnormalities of gait and mobility: Secondary | ICD-10-CM | POA: Diagnosis not present

## 2019-07-11 DIAGNOSIS — R41841 Cognitive communication deficit: Secondary | ICD-10-CM | POA: Diagnosis not present

## 2019-07-11 DIAGNOSIS — M5412 Radiculopathy, cervical region: Secondary | ICD-10-CM | POA: Diagnosis not present

## 2019-07-12 ENCOUNTER — Other Ambulatory Visit: Payer: Self-pay

## 2019-07-12 ENCOUNTER — Ambulatory Visit (INDEPENDENT_AMBULATORY_CARE_PROVIDER_SITE_OTHER): Payer: Medicare Other | Admitting: Family Medicine

## 2019-07-12 ENCOUNTER — Telehealth: Payer: Self-pay | Admitting: Family Medicine

## 2019-07-12 ENCOUNTER — Encounter: Payer: Self-pay | Admitting: Family Medicine

## 2019-07-12 VITALS — Ht 61.0 in | Wt 122.0 lb

## 2019-07-12 DIAGNOSIS — Z86711 Personal history of pulmonary embolism: Secondary | ICD-10-CM | POA: Diagnosis not present

## 2019-07-12 DIAGNOSIS — M25472 Effusion, left ankle: Secondary | ICD-10-CM

## 2019-07-12 DIAGNOSIS — M25471 Effusion, right ankle: Secondary | ICD-10-CM

## 2019-07-12 DIAGNOSIS — I10 Essential (primary) hypertension: Secondary | ICD-10-CM

## 2019-07-12 DIAGNOSIS — G4700 Insomnia, unspecified: Secondary | ICD-10-CM

## 2019-07-12 MED ORDER — TRAZODONE HCL 50 MG PO TABS: 50.0000 mg | ORAL_TABLET | Freq: Every evening | ORAL | 0 refills | Status: DC | PRN

## 2019-07-12 MED ORDER — WARFARIN SODIUM 1 MG PO TABS: ORAL_TABLET | ORAL | 3 refills | Status: DC

## 2019-07-12 NOTE — Telephone Encounter (Signed)
Please call [facility] and let them know we are adding a dose of Coumadin 1 mg to Thursdays.  She will now be taking Coumadin 1 mg on Tuesday Thursday and Saturday.  She will be taking Coumadin 3 mg every day.  They need to know this so that they can alter her medication packaging.  Please also fax the paper prescription for labs to [facility] at the [facility].

## 2019-07-12 NOTE — Assessment & Plan Note (Signed)
We are going to try her on trazodone.  If this is not beneficial then we will continue with the 0.25 mg of Xanax.  I did discuss the risk of side effects and falls with the Xanax and my preference would be that she not take this.

## 2019-07-12 NOTE — Assessment & Plan Note (Addendum)
Bilateral ankle edema right slightly greater than left.  Could be related to varicose veins or venous insufficiency.  Bilateral nature would argue against VTE.  Does not seem to be related to heart failure.  We will obtain lab work early next week to help determine the next step in management.

## 2019-07-12 NOTE — Assessment & Plan Note (Signed)
INR slightly subtherapeutic on prior testing.  She is due to have this rechecked early next week.  We are going to have her start on Coumadin 4 mg Tuesday, Thursday, and Saturday and remain on 3 mg the other days of the week.

## 2019-07-12 NOTE — Assessment & Plan Note (Signed)
I encouraged her to start checking her blood pressure again.  She will continue her current regimen.

## 2019-07-12 NOTE — Progress Notes (Signed)
Virtual Visit via video Note  This visit type was conducted due to national recommendations for restrictions regarding the COVID-19 pandemic (e.g. social distancing).  This format is felt to be most appropriate for this patient at this time.  All issues noted in this document were discussed and addressed.  No physical exam was performed (except for noted visual exam findings with Video Visits).   I connected with [name] today at  1:45 PM EST by a video enabled telemedicine application or telephone and verified that I am speaking with the correct person using two identifiers. Location patient: home Location provider: work  Persons participating in the virtual visit: patient, provider, Iantha Fallen (RN), Duanne Limerick (son)  I discussed the limitations, risks, security and privacy concerns of performing an evaluation and management service by telephone and the availability of in person appointments. I also discussed with the patient that there may be a patient responsible charge related to this service. The patient expressed understanding and agreed to proceed.   Reason for visit: follow-up  HPI: Ankle edema: This has been going on for about 2 months.  It is isolated to her ankles.  Right greater than left.  Notes they are puffy and she looks like the muffin man.  She notes they do not go down overnight.  She does have varicose veins.  She notes no pain related to this.  She notes no shortness of breath, orthopnea, or PND.  The RN that is with her notes no pitting.  Hypertension: Not checking blood pressures.  Taking metoprolol and losartan.  No chest pain, shortness of breath.  Edema as outlined above.  History of PE: No bleeding issues.  She is on warfarin.  She has been on warfarin 4 mg on Tuesday and Saturday and 3 mg the other days of the week.  Sleep difficulty: Patient has been taking 0.25 mg of Xanax nightly to help with sleep.  She will only get 2 to 2-1/2 hours of sleep if she  does not take this.  It does not make her drowsy the next day.  Only mild anxiety with being concerned about the pandemic.  No depression.  She has intermittently been on Xanax for quite some time.  She has tried trazodone the does not remember how she did with this.   ROS: See pertinent positives and negatives per HPI.  Past Medical History:  Diagnosis Date  . A-fib ([place])   . Allergy   . Breast cancer ([place]) 07/22/2012   T1c, Nx carcinoma left breast, ER 90%, PR 90%, HER-2/neu not over expressed.  . Hypertension   . Insomnia   . Osteoporosis    Prolia 10/2012  . PAT (paroxysmal atrial tachycardia) ([place])    a. Dx 03/2019 - improved w/ beta blocker; b. 03/2019 Echo: EF 55-60%. Impaired relaxation. RVSP 48.30mHg. Mild to mod MR/TR.  .[place]  . S/P IVC filter 2009    Past Surgical History:  Procedure Laterality Date  . BLADDER SURGERY    . BREAST SURGERY Left 2013   mastectomy  . CHOLECYSTECTOMY    . HERNIA REPAIR    . MASTECTOMY Left 2013   BREAST CA  . REPLACEMENT TOTAL KNEE     right  . TONSILLECTOMY      Family History  Problem Relation Age of Onset  . COPD Mother   . Stroke Father   . Breast cancer Maternal Aunt     SOCIAL HX: Former smoker  Current Outpatient Medications:  .  albuterol (PROVENTIL HFA;VENTOLIN HFA) 108 (90 Base) MCG/ACT inhaler, Inhale 2 puffs into the lungs every 6 (six) hours as needed for wheezing or shortness of breath., Disp: 1 Inhaler, Rfl: 1 .  ALPRAZolam (XANAX) 0.5 MG tablet, Take 0.5 tablets (0.25 mg total) by mouth at bedtime as needed for anxiety or sleep., Disp: 30 tablet, Rfl: 0 .  Biotin 10 MG TABS, Take 10 mg by mouth daily. , Disp: , Rfl:  .  Cholecalciferol (VITAMIN D PO), Take 2,000 mg by mouth daily., Disp: , Rfl:  .  denosumab (PROLIA) 60 MG/ML SOSY injection, Prolia 60 mg/mL subcutaneous syringe  Inject 1 mL by subcutaneous route., Disp: , Rfl:  .  diphenoxylate-atropine (LOMOTIL) 2.5-0.025 MG tablet, Take  1 tablet by mouth 4 (four) times daily as needed for diarrhea or loose stools., Disp: 30 tablet, Rfl: 0 .  fluticasone (FLONASE) 50 MCG/ACT nasal spray, Place 2 sprays into both nostrils daily., Disp: 16 g, Rfl: 6 .  losartan (COZAAR) 25 MG tablet, Take 1 tablet (25 mg total) by mouth daily., Disp: 90 tablet, Rfl: 0 .  metoprolol tartrate (LOPRESSOR) 50 MG tablet, Take 1 tablet (50 mg total) by mouth 2 (two) times daily., Disp: 180 tablet, Rfl: 2 .  montelukast (SINGULAIR) 10 MG tablet, Take 1 tablet (10 mg total) by mouth at bedtime., Disp: 90 tablet, Rfl: 3 .  triamcinolone cream (KENALOG) 0.1 %, Apply 1 application topically 2 (two) times daily. For up to 7 days. Do not apply to the face., Disp: 30 g, Rfl: 0 .  warfarin (COUMADIN) 1 MG tablet, Take 1 mg by mouth on Tuesday, Thursday, and Saturday, Disp: 30 tablet, Rfl: 3 .  warfarin (COUMADIN) 3 MG tablet, Take 1 tablet (3 mg total) by mouth daily., Disp: 90 tablet, Rfl: 3 .  XIIDRA 5 % SOLN, Place 1-2 drops into both eyes daily. , Disp: , Rfl:  .  traZODone (DESYREL) 50 MG tablet, Take 1 tablet (50 mg total) by mouth at bedtime as needed for sleep., Disp: 30 tablet, Rfl: 0  EXAM:  VITALS per patient if applicable:  GENERAL: alert, oriented, appears well and in no acute distress  HEENT: atraumatic, conjunttiva clear, no obvious abnormalities on inspection of external nose and ears  NECK: normal movements of the head and neck  LUNGS: on inspection no signs of respiratory distress, breathing rate appears normal, no obvious gross SOB, gasping or wheezing  CV: no obvious cyanosis  MS: moves all visible extremities without noticeable abnormality  PSYCH/NEURO: pleasant and cooperative, no obvious depression or anxiety, speech and thought processing grossly intact  ASSESSMENT AND PLAN:  Discussed the following assessment and plan:  Ankle edema, bilateral Bilateral ankle edema right slightly greater than left.  Could be related to  varicose veins or venous insufficiency.  Bilateral nature would argue against VTE.  Does not seem to be related to heart failure.  We will obtain lab work early next week to help determine the next step in management.  History of pulmonary embolus (PE) INR slightly subtherapeutic on prior testing.  She is due to have this rechecked early next week.  We are going to have her start on Coumadin 4 mg Tuesday, Thursday, and Saturday and remain on 3 mg the other days of the week.  Insomnia We are going to try her on trazodone.  If this is not beneficial then we will continue with the 0.25 mg of Xanax.  I did discuss the  risk of side effects and falls with the Xanax and my preference would be that she not take this.  Hypertension I encouraged her to start checking her blood pressure again.  She will continue her current regimen.    I discussed the assessment and treatment plan with the patient. The patient was provided an opportunity to ask questions and all were answered. The patient agreed with the plan and demonstrated an understanding of the instructions.   The patient was advised to call back or seek an in-person evaluation if the symptoms worsen or if the condition fails to improve as anticipated.   [name], MD

## 2019-07-12 NOTE — Telephone Encounter (Signed)
I called and left a voicemail for [facility] about adding to the patient's medication package and I faxed the Rx for labs to Madie Reno at the [place] [facility] [place].  Jimmylee Ratterree,cma

## 2019-07-13 DIAGNOSIS — M5412 Radiculopathy, cervical region: Secondary | ICD-10-CM | POA: Diagnosis not present

## 2019-07-13 DIAGNOSIS — R41841 Cognitive communication deficit: Secondary | ICD-10-CM | POA: Diagnosis not present

## 2019-07-13 DIAGNOSIS — R2689 Other abnormalities of gait and mobility: Secondary | ICD-10-CM | POA: Diagnosis not present

## 2019-07-13 DIAGNOSIS — R279 Unspecified lack of coordination: Secondary | ICD-10-CM | POA: Diagnosis not present

## 2019-07-13 DIAGNOSIS — M6281 Muscle weakness (generalized): Secondary | ICD-10-CM | POA: Diagnosis not present

## 2019-07-16 ENCOUNTER — Telehealth: Payer: Self-pay | Admitting: Family Medicine

## 2019-07-16 NOTE — Telephone Encounter (Signed)
Please let the patient know that I received a form from [facility] requesting additional information on her cognitive ability regarding benefit eligibility for long-term care benefit reimbursement.  If this is something she is looking for we will need to do another visit so I can assess her cognitive function so I can appropriately answer the questions on the form.  Please see if she is willing to complete another visit to discuss this further.  Thanks

## 2019-07-17 NOTE — Telephone Encounter (Signed)
I called the patient and she does need this from fillout and is willing to do a telephone or virtual visit, please advise on where to schedule.  Caidin Heidenreich,cma

## 2019-07-18 ENCOUNTER — Ambulatory Visit: Payer: Medicare Other | Admitting: Cardiovascular Disease

## 2019-07-18 DIAGNOSIS — R2689 Other abnormalities of gait and mobility: Secondary | ICD-10-CM | POA: Diagnosis not present

## 2019-07-18 DIAGNOSIS — M25471 Effusion, right ankle: Secondary | ICD-10-CM | POA: Diagnosis not present

## 2019-07-18 DIAGNOSIS — F0390 Unspecified dementia without behavioral disturbance: Secondary | ICD-10-CM | POA: Diagnosis not present

## 2019-07-18 DIAGNOSIS — M5412 Radiculopathy, cervical region: Secondary | ICD-10-CM | POA: Diagnosis not present

## 2019-07-18 DIAGNOSIS — R41841 Cognitive communication deficit: Secondary | ICD-10-CM | POA: Diagnosis not present

## 2019-07-18 DIAGNOSIS — M25472 Effusion, left ankle: Secondary | ICD-10-CM | POA: Diagnosis not present

## 2019-07-18 DIAGNOSIS — R279 Unspecified lack of coordination: Secondary | ICD-10-CM | POA: Diagnosis not present

## 2019-07-18 DIAGNOSIS — M6281 Muscle weakness (generalized): Secondary | ICD-10-CM | POA: Diagnosis not present

## 2019-07-18 LAB — HEPATIC FUNCTION PANEL
ALT: 10 (ref 7–35)
AST: 16 (ref 13–35)
Alkaline Phosphatase: 50 (ref 25–125)

## 2019-07-18 LAB — BASIC METABOLIC PANEL
BUN: 10 (ref 4–21)
CO2: 20 (ref 13–22)
Chloride: 105 (ref 99–108)
Creatinine: 0.9 (ref <=1.1)
Glucose: 178
Potassium: 4.1 (ref 3.4–5.3)
Sodium: 140 (ref 137–147)

## 2019-07-18 LAB — COMPREHENSIVE METABOLIC PANEL
Albumin: 4.1 (ref 3.5–5.0)
Calcium: 9.2 (ref 8.7–10.7)
GFR calc Af Amer: 62
GFR calc non Af Amer: 54
Globulin: 2.7

## 2019-07-18 LAB — CBC: RBC: 4.96 (ref 3.87–5.11)

## 2019-07-18 LAB — CBC AND DIFFERENTIAL
HCT: 44 (ref 36–46)
Hemoglobin: 14.5 (ref 12.0–16.0)
Platelets: 271 (ref 150–399)
WBC: 6.7

## 2019-07-18 NOTE — Telephone Encounter (Signed)
11 am on 08/03/18 is the first available.

## 2019-07-19 NOTE — Telephone Encounter (Signed)
No appts available will check for cancellations as the holidays go by to schedule.  Naarah Borgerding,cma

## 2019-07-22 ENCOUNTER — Other Ambulatory Visit: Payer: Self-pay | Admitting: Family Medicine

## 2019-07-25 ENCOUNTER — Telehealth: Payer: Self-pay | Admitting: Family Medicine

## 2019-07-25 DIAGNOSIS — R279 Unspecified lack of coordination: Secondary | ICD-10-CM | POA: Diagnosis not present

## 2019-07-25 DIAGNOSIS — R2689 Other abnormalities of gait and mobility: Secondary | ICD-10-CM | POA: Diagnosis not present

## 2019-07-25 DIAGNOSIS — R41841 Cognitive communication deficit: Secondary | ICD-10-CM | POA: Diagnosis not present

## 2019-07-25 DIAGNOSIS — M5412 Radiculopathy, cervical region: Secondary | ICD-10-CM | POA: Diagnosis not present

## 2019-07-25 DIAGNOSIS — M6281 Muscle weakness (generalized): Secondary | ICD-10-CM | POA: Diagnosis not present

## 2019-07-25 NOTE — Telephone Encounter (Signed)
I called to [employer] and informed them on their voicemail that we did received the form from the insurance company.  Johnmark Geiger,cma

## 2019-07-25 NOTE — Telephone Encounter (Signed)
Patient's son Shemiah Deery brought in a letter from patient's insurance on what is needed from Dr. [name]. Any questions please call patient's son Gershon Mussel, [phone]. Letter is up front in color folder.

## 2019-07-25 NOTE — Telephone Encounter (Signed)
Theo called from [employer] [phone] regarding cognitive history form she wants to know if it was received it was faxed yesterday. Please advise and Thank you!

## 2019-07-25 NOTE — Telephone Encounter (Signed)
This patient was to try trazodone for her sleep. Please see if she tried the trazodone and if it was helpful. Thanks.

## 2019-07-25 NOTE — Telephone Encounter (Signed)
Pt son called stating the form that was spoken about in this previous note, does need to be filled out per son. Do we still have the form? If so it needs to be filled out and faxed to insurance company. Please advise and Thank you!  FYI per son please do not call pt call son. Thank you!  Call Thomas @ [phone].

## 2019-07-26 DIAGNOSIS — R2689 Other abnormalities of gait and mobility: Secondary | ICD-10-CM | POA: Diagnosis not present

## 2019-07-26 DIAGNOSIS — R279 Unspecified lack of coordination: Secondary | ICD-10-CM | POA: Diagnosis not present

## 2019-07-26 DIAGNOSIS — M6281 Muscle weakness (generalized): Secondary | ICD-10-CM | POA: Diagnosis not present

## 2019-07-26 DIAGNOSIS — R41841 Cognitive communication deficit: Secondary | ICD-10-CM | POA: Diagnosis not present

## 2019-07-26 DIAGNOSIS — M5412 Radiculopathy, cervical region: Secondary | ICD-10-CM | POA: Diagnosis not present

## 2019-07-26 LAB — POCT INR: INR: 1.6 — AB (ref <=1.1)

## 2019-07-26 LAB — PROTIME-INR: INR: 1.6 — AB (ref 0.9–1.1)

## 2019-07-26 NOTE — Telephone Encounter (Signed)
Letter was put with the form in the signed basket.  Shinichi Anguiano,cma

## 2019-07-27 ENCOUNTER — Telehealth: Payer: Self-pay

## 2019-07-27 DIAGNOSIS — R2689 Other abnormalities of gait and mobility: Secondary | ICD-10-CM | POA: Diagnosis not present

## 2019-07-27 DIAGNOSIS — M6281 Muscle weakness (generalized): Secondary | ICD-10-CM | POA: Diagnosis not present

## 2019-07-27 DIAGNOSIS — R279 Unspecified lack of coordination: Secondary | ICD-10-CM | POA: Diagnosis not present

## 2019-07-27 DIAGNOSIS — M5412 Radiculopathy, cervical region: Secondary | ICD-10-CM | POA: Diagnosis not present

## 2019-07-27 DIAGNOSIS — R41841 Cognitive communication deficit: Secondary | ICD-10-CM | POA: Diagnosis not present

## 2019-07-27 MED ORDER — WARFARIN SODIUM 1 MG PO TABS: ORAL_TABLET | ORAL | 3 refills | Status: DC

## 2019-07-27 NOTE — Telephone Encounter (Signed)
Noted.  I will complete this when I see her in the office.

## 2019-07-27 NOTE — Telephone Encounter (Signed)
I called and spoke with [name]  the RN at the facility the patient is living and informed him of her PT INR results 1.6 and I gave him her new coumadin regimen and I also informed the patient of her results and the change.  Jackson Coffield,cma

## 2019-07-27 NOTE — Telephone Encounter (Signed)
Noted.  The patient needs to be on Coumadin 4 mg Tuesday, Wednesday, Thursday, Saturday, and Sunday.  She will take Coumadin 3 mg on Monday and Friday.  She should have her INR rechecked in 1 week.  Thanks.

## 2019-07-30 ENCOUNTER — Telehealth: Payer: Self-pay | Admitting: Family Medicine

## 2019-07-30 NOTE — Telephone Encounter (Signed)
Please let the patient know that her glucose is elevated on her most recent labs. This level does not match up with her prior A1c from November. She needs to have an A1c and glucose checked on or after February 7th. Please see if he facility can do this. Thanks.

## 2019-07-31 NOTE — Telephone Encounter (Signed)
I called the patient and explained to her that her glucose was elevated  and it would need to be checked  on or after February 7th. I also called and spoke to the RN Madie Reno and informed him that the patient would need a lab recheck drawn on or after February 7, he also informed me that the patient had started the new coumadin regimen this week. Niaya Hickok,cma

## 2019-08-01 DIAGNOSIS — M6281 Muscle weakness (generalized): Secondary | ICD-10-CM | POA: Diagnosis not present

## 2019-08-01 DIAGNOSIS — R2689 Other abnormalities of gait and mobility: Secondary | ICD-10-CM | POA: Diagnosis not present

## 2019-08-01 DIAGNOSIS — M5412 Radiculopathy, cervical region: Secondary | ICD-10-CM | POA: Diagnosis not present

## 2019-08-01 DIAGNOSIS — R279 Unspecified lack of coordination: Secondary | ICD-10-CM | POA: Diagnosis not present

## 2019-08-01 DIAGNOSIS — R41841 Cognitive communication deficit: Secondary | ICD-10-CM | POA: Diagnosis not present

## 2019-08-02 ENCOUNTER — Ambulatory Visit: Payer: Medicare Other | Admitting: Family Medicine

## 2019-08-03 DIAGNOSIS — M5412 Radiculopathy, cervical region: Secondary | ICD-10-CM | POA: Diagnosis not present

## 2019-08-03 DIAGNOSIS — R41841 Cognitive communication deficit: Secondary | ICD-10-CM | POA: Diagnosis not present

## 2019-08-03 DIAGNOSIS — M6281 Muscle weakness (generalized): Secondary | ICD-10-CM | POA: Diagnosis not present

## 2019-08-03 DIAGNOSIS — R2689 Other abnormalities of gait and mobility: Secondary | ICD-10-CM | POA: Diagnosis not present

## 2019-08-03 DIAGNOSIS — R279 Unspecified lack of coordination: Secondary | ICD-10-CM | POA: Diagnosis not present

## 2019-08-04 NOTE — Telephone Encounter (Signed)
Please see my prior message regarding this.  She was supposed to try trazodone.  Please see if you try that to help her sleep.

## 2019-08-08 DIAGNOSIS — R2689 Other abnormalities of gait and mobility: Secondary | ICD-10-CM | POA: Diagnosis not present

## 2019-08-08 DIAGNOSIS — M6281 Muscle weakness (generalized): Secondary | ICD-10-CM | POA: Diagnosis not present

## 2019-08-08 DIAGNOSIS — R41841 Cognitive communication deficit: Secondary | ICD-10-CM | POA: Diagnosis not present

## 2019-08-08 DIAGNOSIS — M5412 Radiculopathy, cervical region: Secondary | ICD-10-CM | POA: Diagnosis not present

## 2019-08-08 DIAGNOSIS — R279 Unspecified lack of coordination: Secondary | ICD-10-CM | POA: Diagnosis not present

## 2019-08-09 DIAGNOSIS — M5412 Radiculopathy, cervical region: Secondary | ICD-10-CM | POA: Diagnosis not present

## 2019-08-09 DIAGNOSIS — R279 Unspecified lack of coordination: Secondary | ICD-10-CM | POA: Diagnosis not present

## 2019-08-09 DIAGNOSIS — R2689 Other abnormalities of gait and mobility: Secondary | ICD-10-CM | POA: Diagnosis not present

## 2019-08-09 DIAGNOSIS — R41841 Cognitive communication deficit: Secondary | ICD-10-CM | POA: Diagnosis not present

## 2019-08-09 DIAGNOSIS — M6281 Muscle weakness (generalized): Secondary | ICD-10-CM | POA: Diagnosis not present

## 2019-08-10 ENCOUNTER — Ambulatory Visit
Admission: RE | Admit: 2019-08-10 | Discharge: 2019-08-10 | Disposition: A | Discharge status: Home / Self Care | Payer: Medicare Other | Source: Ambulatory Visit | Attending: General Surgery | Admitting: General Surgery

## 2019-08-10 DIAGNOSIS — Z1231 Encounter for screening mammogram for malignant neoplasm of breast: Secondary | ICD-10-CM | POA: Diagnosis not present

## 2019-08-11 ENCOUNTER — Other Ambulatory Visit: Payer: Self-pay | Admitting: Family Medicine

## 2019-08-15 ENCOUNTER — Other Ambulatory Visit: Payer: Self-pay

## 2019-08-15 ENCOUNTER — Ambulatory Visit (INDEPENDENT_AMBULATORY_CARE_PROVIDER_SITE_OTHER): Payer: Medicare Other | Admitting: Family Medicine

## 2019-08-15 ENCOUNTER — Encounter: Payer: Self-pay | Admitting: Family Medicine

## 2019-08-15 VITALS — BP 110/80 | HR 79 | Temp 96.7°F | Ht 61.0 in | Wt 119.8 lb

## 2019-08-15 DIAGNOSIS — T148XXA Other injury of unspecified body region, initial encounter: Secondary | ICD-10-CM

## 2019-08-15 DIAGNOSIS — R2681 Unsteadiness on feet: Secondary | ICD-10-CM

## 2019-08-15 DIAGNOSIS — G4700 Insomnia, unspecified: Secondary | ICD-10-CM | POA: Diagnosis not present

## 2019-08-15 DIAGNOSIS — Z86711 Personal history of pulmonary embolism: Secondary | ICD-10-CM | POA: Diagnosis not present

## 2019-08-15 DIAGNOSIS — F0390 Unspecified dementia without behavioral disturbance: Secondary | ICD-10-CM

## 2019-08-15 DIAGNOSIS — I83893 Varicose veins of bilateral lower extremities with other complications: Secondary | ICD-10-CM | POA: Diagnosis not present

## 2019-08-15 LAB — PROTIME-INR
INR: 3.1 ratio — ABNORMAL HIGH (ref 0.8–1.0)
Prothrombin Time: 35.4 s — ABNORMAL HIGH (ref 9.6–13.1)

## 2019-08-15 MED ORDER — DIPHENOXYLATE-ATROPINE 2.5-0.025 MG PO TABS: 1.0000 | ORAL_TABLET | Freq: Four times a day (QID) | ORAL | 0 refills | Status: DC | PRN

## 2019-08-15 NOTE — Assessment & Plan Note (Signed)
Due for INR to be checked.

## 2019-08-15 NOTE — Progress Notes (Signed)
[name], MD Phone: [phone]  [name] is a 86 y.o. female who presents today for f/u.  COVID-19 vaccine: Patient notes that the site of her vaccine turned yellow shortly after getting the vaccine.  She notes there was some discomfort in that area around that time.  She notes this was given 2 weeks ago.  She has no more discomfort.  There is still a yellow bruise.  Mobility difficulty: Patient would like rolling walker.  She has a cane and worries about it getting tangled up in her legs and her falling.  She does have a history of fall several years ago.  She does note some balance issues when walking around that are chronic.  She wants to start going for walks and thinks a walker will be beneficial.  Cognitive difficulty: Patient has had some mild cognitive difficulties.  She presents to have paperwork filled out for her insurance.  She and her son provide history regarding this.  She has no ADL issues.  She does have difficulty keeping up with her medications and incorporating feedback to help provide a safer environment for herself.  She cannot remember when her appointments are.  She has a alert button that she does not wear despite encouragement and feedback on wearing this.  She repeats lots of things.  No impaired orientation to person or place.  She is oriented to month and year though not day.  Most recent cognitive evaluation prior to today was 03/19/2017.  Patient's son notes her cognitive issues seem to have become more of an issue towards the start of Covid around March 2020.  Edema: Bilateral edema.  This continues to be an issue.  Does not resolve with propping her legs up.  She does have varicose veins.  She does have compression stockings though she has not been wearing them as they are difficult to get on.  No PND or orthopnea.  Sleep difficulty: She has not tried the trazodone.  This needs to be sent to her pharmacy again.  Social History   Tobacco Use  Smoking  Status Former Smoker   Types: Cigarettes   Quit date: 05/13/1999   Years since quitting: 20.2  Smokeless Tobacco Never Used     ROS see history of present illness  Objective  Physical Exam Vitals:   08/15/19 1125  BP: 110/80  Pulse: 79  Temp: (!) 96.7 F (35.9 C)  SpO2: 95%    BP Readings from Last 3 Encounters:  08/15/19 110/80  06/02/19 (!) 163/85  05/23/19 140/84   Wt Readings from Last 3 Encounters:  08/15/19 119 lb 12.8 oz (54.3 kg)  07/12/19 122 lb (55.3 kg)  06/01/19 125 lb (56.7 kg)    Physical Exam Constitutional:      General: She is not in acute distress.    Appearance: She is not diaphoretic.  Pulmonary:     Effort: Pulmonary effort is normal.  Musculoskeletal:     Comments: Bruise over the right deltoid, nontender, no underlying palpable abnormalities, minimal nonpitting edema in her ankles, varicose veins noted throughout her lower extremities, 2+ DP pulses bilaterally  Skin:    General: Skin is warm and dry.  Neurological:     Mental Status: She is alert.     Comments: Slow steady gait with use of cane    MMSE - Mini Mental State Exam 08/15/2019 04/22/2018 03/19/2017  Not completed: - Refused -  Orientation to time 3 - 4  Orientation to Place 5 - 5  Registration 3 - 3  Attention/ Calculation 1 - 1  Recall 3 - 1  Language- name 2 objects 2 - 2  Language- repeat 1 - 1  Language- follow 3 step command 3 - 3  Language- read & follow direction 1 - 1  Write a sentence 1 - 1  Copy design 0 - 1  Total score 23 - 23     Assessment/Plan: Please see individual problem list.  Varicose veins of both lower extremities This is the likely cause of her swelling.  Discussed that treatment for swelling related to this and venous insufficiency would be wearing her compression stockings.  I encouraged her to wear these to treat this issue.  Discussed that there is not a medication that would decrease her swelling.  Gait instability Prescription for  rolling walker created.  She will get this from a DME store.  History of pulmonary embolus (PE) Due for INR to be checked.  Dementia [facility]) Patient does have mild dementia.  MMSE appears to be stable.  I do think she would benefit from having a caregiver with her to a certain degree.  We will complete her paperwork.  I did discuss that this does not necessarily mean that she will get coverage for this as the insurance company will have to review my information and then make the determination on their own.  She does appear to be at risk to her health and safety given her issues incorporating feedback regarding safety.  Bruise Related to recent vaccine.  Discussed if it does not improve over the next week or 2 she should let us know.  Insomnia She will trial trazodone.  This was sent to her pharmacy again today.  Orders Placed This Encounter  Procedures   For home use only DME 4 wheeled rolling walker with seat [id number]    R26.81 - patient request 6 inch wheels    Order Specific Question:   Patient needs a walker to treat with the following condition    Answer:   Gait instability [290393]   Protime-INR    Meds ordered this encounter  Medications   diphenoxylate-atropine (LOMOTIL) 2.5-0.025 MG tablet    Sig: Take 1 tablet by mouth 4 (four) times daily as needed for diarrhea or loose stools.    Dispense:  30 tablet    Refill:  0    This visit occurred during the SARS-CoV-2 public health emergency.  Safety protocols were in place, including screening questions prior to the visit, additional usage of staff PPE, and extensive cleaning of exam room while observing appropriate contact time as indicated for disinfecting solutions.   I have spent 52 minutes in the care of this patient regarding chart review, history taking from the patient and her son, completion of her form, documentation, and placing orders.    [name], MD [place]

## 2019-08-15 NOTE — Assessment & Plan Note (Signed)
She will trial trazodone.  This was sent to her pharmacy again today.

## 2019-08-15 NOTE — Assessment & Plan Note (Signed)
Patient does have mild dementia.  MMSE appears to be stable.  I do think she would benefit from having a caregiver with her to a certain degree.  We will complete her paperwork.  I did discuss that this does not necessarily mean that she will get coverage for this as the insurance company will have to review my information and then make the determination on their own.  She does appear to be at risk to her health and safety given her issues incorporating feedback regarding safety.

## 2019-08-15 NOTE — Patient Instructions (Signed)
Nice to see you. We will get your INR today.  Please take the prescription for the rolling walker to the medical supply company. Please wear your compression stockings. Please try the trazodone for your sleep.

## 2019-08-15 NOTE — Assessment & Plan Note (Signed)
Related to recent vaccine.  Discussed if it does not improve over the next week or 2 she should let us know.

## 2019-08-15 NOTE — Assessment & Plan Note (Signed)
Prescription for rolling walker created.  She will get this from a DME store.

## 2019-08-15 NOTE — Assessment & Plan Note (Signed)
This is the likely cause of her swelling.  Discussed that treatment for swelling related to this and venous insufficiency would be wearing her compression stockings.  I encouraged her to wear these to treat this issue.  Discussed that there is not a medication that would decrease her swelling.

## 2019-08-16 DIAGNOSIS — R279 Unspecified lack of coordination: Secondary | ICD-10-CM | POA: Diagnosis not present

## 2019-08-16 DIAGNOSIS — M5412 Radiculopathy, cervical region: Secondary | ICD-10-CM | POA: Diagnosis not present

## 2019-08-16 DIAGNOSIS — R2689 Other abnormalities of gait and mobility: Secondary | ICD-10-CM | POA: Diagnosis not present

## 2019-08-16 DIAGNOSIS — M6281 Muscle weakness (generalized): Secondary | ICD-10-CM | POA: Diagnosis not present

## 2019-08-16 DIAGNOSIS — R41841 Cognitive communication deficit: Secondary | ICD-10-CM | POA: Diagnosis not present

## 2019-08-17 DIAGNOSIS — Z853 Personal history of malignant neoplasm of breast: Secondary | ICD-10-CM | POA: Diagnosis not present

## 2019-08-20 NOTE — Progress Notes (Signed)
Cardiology Office Note  Date:  08/22/2019   ID:  [name], DOB Dec 06, 1925, MRN [id number]  PCP:  [name] Haven, MD   Chief Complaint  Patient presents with  . OTher    3 month follow up. Patient c.o swelling in both ankles. Meds reviewed verbally with patient.     HPI:  86 year old female with a history of  hypertension,  PE status post IVC filter on chronic Coumadin,  breast cancer,  depression,  hospitalization related to orthostatic lightheadedness and tachypalpitations with finding of atrial tachycardia Who presents for atrial tachycardia  In follow-up today [name] presents with [name] son Son reports [name] is had a poor year, leg weakness, some cognitive decline He attributes this to social isolation from Covid and inactivity  [name] reports legs are weak, uses a cane No falls [name] denies any tachy palpitations  Tolerating metoprolol, losartan warfarin  EKG personally reviewed by myself on todays visit Shows normal sinus rhythm rate 63 bpm unable to exclude old inferior MI  Other past medical history reviewed August 2020, [name]  noted intermittent tachypalpitations, orthostasis.  emergency department on March 31 2019  runs of atrial tachycardia with rates up into the 130s.   Head CT was negative for any acute changes.    recommendation for discontinuation of amlodipine and HCTZ and addition of metoprolol 50 mg twice daily.   Echocardiogram showed normal LV function with impaired relaxation.     At primary care follow-up, [name] was hypertensive and amlodipine was added back however [name] never resumed it due to concern for palpitations.  Instead, losartan was added   Of note, [name] is also follow-up with hematology with recommendation for vascular follow-up for IVC removal.  It is felt that [name] would be able to come off of anticoagulation if IVC could be removed.  [name] has been reluctant to have it removed in the past as [name] says [name] is okay with taking Coumadin and would just  prefer to avoid another procedure.    PMH:   has a past medical history of A-fib ([place]), Allergy, Breast cancer ([place]) (07/22/2012), Hypertension, Insomnia, Osteoporosis, PAT (paroxysmal atrial tachycardia) ([place]), Pulmonary embolus ([place]) (2009), and S/P IVC filter (2009).  PSH:    Past Surgical History:  Procedure Laterality Date  . BLADDER SURGERY    . BREAST SURGERY Left 2013   mastectomy  . CHOLECYSTECTOMY    . HERNIA REPAIR    . MASTECTOMY Left 2013   BREAST CA  . REPLACEMENT TOTAL KNEE     right  . TONSILLECTOMY      Current Outpatient Medications  Medication Sig Dispense Refill  . albuterol (PROVENTIL HFA;VENTOLIN HFA) 108 (90 Base) MCG/ACT inhaler Inhale 2 puffs into the lungs every 6 (six) hours as needed for wheezing or shortness of breath. 1 Inhaler 1  . ALPRAZolam (XANAX) 0.5 MG tablet Take 0.5 tablets (0.25 mg total) by mouth at bedtime as needed for anxiety or sleep. 30 tablet 0  . Biotin 10 MG TABS Take 10 mg by mouth daily.     . Cholecalciferol (VITAMIN D PO) Take 2,000 mg by mouth daily.    Marland Kitchen denosumab (PROLIA) 60 MG/ML SOSY injection Prolia 60 mg/mL subcutaneous syringe  Inject 1 mL by subcutaneous route.    . diphenoxylate-atropine (LOMOTIL) 2.5-0.025 MG tablet Take 1 tablet by mouth 4 (four) times daily as needed for diarrhea or loose stools. 30 tablet 0  . fluticasone (FLONASE) 50 MCG/ACT nasal spray Place 2 sprays into both nostrils  daily. 16 g 6  . losartan (COZAAR) 25 MG tablet Take 1 tablet (25 mg total) by mouth daily. 90 tablet 0  . metoprolol tartrate (LOPRESSOR) 50 MG tablet Take 1 tablet (50 mg total) by mouth 2 (two) times daily. 180 tablet 2  . montelukast (SINGULAIR) 10 MG tablet Take 1 tablet (10 mg total) by mouth at bedtime. 90 tablet 3  . traZODone (DESYREL) 50 MG tablet TAKE 1 TABLET AT BEDTIME AS NEEDED FOR SLEEP 30 tablet 0  . triamcinolone cream (KENALOG) 0.1 % Apply 1 application topically 2 (two) times daily. For up to 7 days. Do not apply to  the face. 30 g 0  . warfarin (COUMADIN) 1 MG tablet Take 1 mg by mouth on Tuesday, Wednesday, Thursday, Saturday, and Sunday 30 tablet 3  . warfarin (COUMADIN) 3 MG tablet Take 1 tablet (3 mg total) by mouth daily. 90 tablet 3  . XIIDRA 5 % SOLN Place 1-2 drops into both eyes daily.      No current facility-administered medications for this visit.     Allergies:   Erythromycin and Sulfa drugs cross reactors   Social History:  The patient  reports that [name] quit smoking about 20 years ago. [name] smoking use included cigarettes. [name] has never used smokeless tobacco. [name] reports that [name] does not drink alcohol or use drugs.   Family History:   family history includes Breast cancer in [name] maternal aunt; COPD in [name] mother; Stroke in [name] father.    Review of Systems: Review of Systems  Constitutional: Negative.   HENT: Negative.   Respiratory: Negative.   Cardiovascular: Negative.   Gastrointestinal: Negative.   Musculoskeletal: Negative.   Neurological: Negative.   Psychiatric/Behavioral: Negative.   All other systems reviewed and are negative.   PHYSICAL EXAM: VS:  BP (!) 152/80 (BP Location: Left Arm, Patient Position: Sitting, Cuff Size: Normal)   Pulse 63   Ht 5' (1.524 m)   Wt 120 lb (54.4 kg)   BMI 23.44 kg/m  , BMI Body mass index is 23.44 kg/m. Constitutional:  oriented to person, place, and time. No distress.  HENT:  Head: Grossly normal Eyes:  no discharge. No scleral icterus.  Neck: No JVD, no carotid bruits  Cardiovascular: Regular rate and rhythm, no murmurs appreciated Pulmonary/Chest: Clear to auscultation bilaterally, no wheezes or rails Abdominal: Soft.  no distension.  no tenderness.  Musculoskeletal: Normal range of motion Neurological:  normal muscle tone. Coordination normal. No atrophy Skin: Skin warm and dry Psychiatric: normal affect, pleasant   Recent Labs: 03/31/2019: TSH 2.635 07/18/2019: ALT 10; BUN 10; Creatinine 0.9; Hemoglobin 14.5; Platelets  271; Potassium 4.1; Sodium 140    Lipid Panel Lab Results  Component Value Date   CHOL 170 06/02/2019   HDL 63 06/02/2019   LDLCALC 89 06/02/2019   TRIG 90 06/02/2019      Wt Readings from Last 3 Encounters:  08/22/19 120 lb (54.4 kg)  08/15/19 119 lb 12.8 oz (54.3 kg)  07/12/19 122 lb (55.3 kg)     ASSESSMENT AND PLAN:  Problem List Items Addressed This Visit      Cardiology Problems   Atrial tachycardia ([place]) - Primary   Relevant Orders   EKG 12-Lead   TIA (transient ischemic attack)   Postural dizziness with near syncope     Other   History of pulmonary embolus (PE)     Atrial tachycardia Rhythm well controlled, continue metoprolol  History of PE Stable on warfarin, no  falls  Essential hypertension Blood pressure stable [name] denies any orthostasis symptoms  Disposition:   F/U  12 months  Long discussion with patient's son, [name] has had decline is here cognitively and physically Requiring more help at home  Total encounter time more than 25 minutes  Greater than 50% was spent in counseling and coordination of care with the patient    Signed, Esmond Plants, M.D., Ph.D. [place], [place]

## 2019-08-22 ENCOUNTER — Ambulatory Visit (INDEPENDENT_AMBULATORY_CARE_PROVIDER_SITE_OTHER): Payer: Medicare Other | Admitting: Cardiovascular Disease

## 2019-08-22 ENCOUNTER — Other Ambulatory Visit: Payer: Self-pay

## 2019-08-22 ENCOUNTER — Encounter: Payer: Self-pay | Admitting: Cardiovascular Disease

## 2019-08-22 VITALS — BP 152/80 | HR 63 | Ht 60.0 in | Wt 120.0 lb

## 2019-08-22 DIAGNOSIS — I471 Supraventricular tachycardia: Secondary | ICD-10-CM | POA: Diagnosis not present

## 2019-08-22 DIAGNOSIS — R55 Syncope and collapse: Secondary | ICD-10-CM | POA: Diagnosis not present

## 2019-08-22 DIAGNOSIS — G459 Transient cerebral ischemic attack, unspecified: Secondary | ICD-10-CM

## 2019-08-22 DIAGNOSIS — Z86711 Personal history of pulmonary embolism: Secondary | ICD-10-CM | POA: Diagnosis not present

## 2019-08-22 DIAGNOSIS — R42 Dizziness and giddiness: Secondary | ICD-10-CM | POA: Diagnosis not present

## 2019-08-22 NOTE — Patient Instructions (Signed)

## 2019-08-23 DIAGNOSIS — Z7901 Long term (current) use of anticoagulants: Secondary | ICD-10-CM | POA: Diagnosis not present

## 2019-08-23 LAB — POCT INR: INR: 1.4 — AB (ref 0.9–1.1)

## 2019-08-24 ENCOUNTER — Telehealth: Payer: Self-pay

## 2019-08-25 ENCOUNTER — Other Ambulatory Visit: Payer: Self-pay | Admitting: Internal Medicine

## 2019-08-25 ENCOUNTER — Encounter: Payer: Self-pay | Admitting: Internal Medicine

## 2019-08-25 ENCOUNTER — Telehealth: Payer: Self-pay | Admitting: Internal Medicine

## 2019-08-25 DIAGNOSIS — Z86711 Personal history of pulmonary embolism: Secondary | ICD-10-CM

## 2019-08-25 MED ORDER — WARFARIN SODIUM 1 MG PO TABS: ORAL_TABLET | ORAL | 3 refills | Status: DC

## 2019-08-25 MED ORDER — WARFARIN SODIUM 3 MG PO TABS: ORAL_TABLET | ORAL | 3 refills | Status: DC

## 2019-08-25 NOTE — Telephone Encounter (Signed)
Left message for patient to return call back.  

## 2019-08-25 NOTE — Telephone Encounter (Signed)
Has she missed any coumadin doses and what dose is she taking exactly    [place]

## 2019-08-25 NOTE — Telephone Encounter (Signed)
Pt was taking coumadin 3 mg qd and 4 mg Tues, sat with last INR 08/15/19 3.1 per pharmacist total care [name]   INR now 1.4 will increase dose to 3 mg daily and 4 mg Tues, Thurs (added back), Saturday    Pt frustrated INR not at goal and fluctuating  Will have her disc with PCP  She wants to consider changing to non coumadin agent I.e eliquis vs other  If PCP decides to continue coumadin not sure if she has home PT/INR machine but this could be useful    Wants to disc with PCP will schedule f/u and INR check upcoming 1 week   [name] call [facility] and see if they can check her INR 09/04/19 or schedule appt to come in and see PCP for this week of 09/04/19 and schedule INR lab if needed here week of 09/04/19 and appt with PCP to disc other options for atrial tachycardia and h/o PE chronic anticoagulation     [name]

## 2019-08-27 NOTE — Telephone Encounter (Signed)
Please follow-up on this message from Dr McLean-Scocuzza. I have discussed the other types of anticoagulation with her previously though she was hesitant to change. I can complete a visit with her to discuss further.

## 2019-08-28 NOTE — Telephone Encounter (Signed)
That slot is fine.

## 2019-08-28 NOTE — Telephone Encounter (Signed)
Scheduled patient.  Reha Martinovich,cma

## 2019-08-28 NOTE — Telephone Encounter (Signed)
Called the patients son and schedules an appt to discuss medication options for PT/INR.  Cherry Wittwer,cma

## 2019-08-28 NOTE — Telephone Encounter (Signed)
Can I use a 15 min same day slot for her on 09/06/2019 or do you have another slot in mind. Esiah Bazinet cma.

## 2019-08-29 DIAGNOSIS — M6281 Muscle weakness (generalized): Secondary | ICD-10-CM | POA: Diagnosis not present

## 2019-08-29 DIAGNOSIS — R2689 Other abnormalities of gait and mobility: Secondary | ICD-10-CM | POA: Diagnosis not present

## 2019-08-29 DIAGNOSIS — M5412 Radiculopathy, cervical region: Secondary | ICD-10-CM | POA: Diagnosis not present

## 2019-08-29 DIAGNOSIS — R41841 Cognitive communication deficit: Secondary | ICD-10-CM | POA: Diagnosis not present

## 2019-08-29 DIAGNOSIS — R279 Unspecified lack of coordination: Secondary | ICD-10-CM | POA: Diagnosis not present

## 2019-08-31 ENCOUNTER — Telehealth: Payer: Self-pay | Admitting: Family Medicine

## 2019-08-31 DIAGNOSIS — R279 Unspecified lack of coordination: Secondary | ICD-10-CM | POA: Diagnosis not present

## 2019-08-31 DIAGNOSIS — M5412 Radiculopathy, cervical region: Secondary | ICD-10-CM | POA: Diagnosis not present

## 2019-08-31 DIAGNOSIS — M6281 Muscle weakness (generalized): Secondary | ICD-10-CM | POA: Diagnosis not present

## 2019-08-31 DIAGNOSIS — R2689 Other abnormalities of gait and mobility: Secondary | ICD-10-CM | POA: Diagnosis not present

## 2019-08-31 DIAGNOSIS — R41841 Cognitive communication deficit: Secondary | ICD-10-CM | POA: Diagnosis not present

## 2019-08-31 NOTE — Telephone Encounter (Signed)
Pt's son called and said he is working with [employer] and they need for an appeal her cognitive or dementia diagnosis.  He is also requesting a copy of what Dr. [name] sent in to [employer]. He said he would like to pick it up tomorrow by lunch.   He would like for you to call him. [phone]

## 2019-08-31 NOTE — Telephone Encounter (Signed)
I called the patient's son and he requested a copy of the form from [employer] for his records to do an appeal and I informed him I would place a copy upfront for him to pick up tomorrow.   Paytan Recine,cma

## 2019-09-04 ENCOUNTER — Telehealth: Payer: Self-pay | Admitting: Family Medicine

## 2019-09-04 ENCOUNTER — Other Ambulatory Visit: Payer: Self-pay | Admitting: Family Medicine

## 2019-09-04 DIAGNOSIS — I10 Essential (primary) hypertension: Secondary | ICD-10-CM

## 2019-09-04 NOTE — Telephone Encounter (Signed)
Pt's son has concerns about pt and home healthcare. Dropped off list of concerns about two weeks ago and states that he never heard back about it. Eather Chaires,cma

## 2019-09-04 NOTE — Telephone Encounter (Signed)
I believe I did not receive this until late last week. I have reviewed it. I am happy to discuss during her visit later this week and to look at any other forms they have for an appeal.

## 2019-09-04 NOTE — Telephone Encounter (Signed)
Pt's son has concerns about pt and home healthcare. Dropped off list of concerns about two weeks ago and states that he never heard back about it. He would like a call back today.

## 2019-09-05 DIAGNOSIS — R279 Unspecified lack of coordination: Secondary | ICD-10-CM | POA: Diagnosis not present

## 2019-09-05 DIAGNOSIS — R41841 Cognitive communication deficit: Secondary | ICD-10-CM | POA: Diagnosis not present

## 2019-09-05 DIAGNOSIS — M6281 Muscle weakness (generalized): Secondary | ICD-10-CM | POA: Diagnosis not present

## 2019-09-05 DIAGNOSIS — R2689 Other abnormalities of gait and mobility: Secondary | ICD-10-CM | POA: Diagnosis not present

## 2019-09-05 DIAGNOSIS — M5412 Radiculopathy, cervical region: Secondary | ICD-10-CM | POA: Diagnosis not present

## 2019-09-05 NOTE — Telephone Encounter (Signed)
She has an appointment with me tomorrow

## 2019-09-05 NOTE — Telephone Encounter (Signed)
Would I need to schedule something with you or do you want to just call them one day this week . Deaunte Dente,cma

## 2019-09-06 ENCOUNTER — Other Ambulatory Visit: Payer: Self-pay

## 2019-09-06 ENCOUNTER — Encounter: Payer: Self-pay | Admitting: Family Medicine

## 2019-09-06 ENCOUNTER — Ambulatory Visit (INDEPENDENT_AMBULATORY_CARE_PROVIDER_SITE_OTHER): Payer: Medicare Other | Admitting: Family Medicine

## 2019-09-06 DIAGNOSIS — Z86711 Personal history of pulmonary embolism: Secondary | ICD-10-CM

## 2019-09-06 DIAGNOSIS — R2681 Unsteadiness on feet: Secondary | ICD-10-CM | POA: Diagnosis not present

## 2019-09-06 DIAGNOSIS — F0390 Unspecified dementia without behavioral disturbance: Secondary | ICD-10-CM | POA: Diagnosis not present

## 2019-09-06 NOTE — Patient Instructions (Addendum)
Apixaban oral tablets What is this medicine? APIXABAN (a PIX a ban) is an anticoagulant (blood thinner). It is used to lower the chance of stroke in people with a medical condition called atrial fibrillation. It is also used to treat or prevent blood clots in the lungs or in the veins. This medicine may be used for other purposes; ask your health care provider or pharmacist if you have questions. COMMON BRAND NAME(S): Eliquis What should I tell my health care provider before I take this medicine? They need to know if you have any of these conditions:  antiphospholipid antibody syndrome  bleeding disorders  bleeding in the brain  blood in your stools (black or tarry stools) or if you have blood in your vomit  history of blood clots  history of stomach bleeding  kidney disease  liver disease  mechanical heart valve  an unusual or allergic reaction to apixaban, other medicines, foods, dyes, or preservatives  pregnant or trying to get pregnant  breast-feeding How should I use this medicine? Take this medicine by mouth with a glass of water. Follow the directions on the prescription label. You can take it with or without food. If it upsets your stomach, take it with food. Take your medicine at regular intervals. Do not take it more often than directed. Do not stop taking except on your doctor's advice. Stopping this medicine may increase your risk of a blood clot. Be sure to refill your prescription before you run out of medicine. Talk to your pediatrician regarding the use of this medicine in children. Special care may be needed. Overdosage: If you think you have taken too much of this medicine contact a poison control center or emergency room at once. NOTE: This medicine is only for you. Do not share this medicine with others. What if I miss a dose? If you miss a dose, take it as soon as you can. If it is almost time for your next dose, take only that dose. Do not take double or  extra doses. What may interact with this medicine? This medicine may interact with the following:  aspirin and aspirin-like medicines  certain medicines for fungal infections like ketoconazole and itraconazole  certain medicines for seizures like carbamazepine and phenytoin  certain medicines that treat or prevent blood clots like warfarin, enoxaparin, and dalteparin  clarithromycin  NSAIDs, medicines for pain and inflammation, like ibuprofen or naproxen  rifampin  ritonavir  St. John's wort This list may not describe all possible interactions. Give your health care provider a list of all the medicines, herbs, non-prescription drugs, or dietary supplements you use. Also tell them if you smoke, drink alcohol, or use illegal drugs. Some items may interact with your medicine. What should I watch for while using this medicine? Visit your healthcare professional for regular checks on your progress. You may need blood work done while you are taking this medicine. Your condition will be monitored carefully while you are receiving this medicine. It is important not to miss any appointments. Avoid sports and activities that might cause injury while you are using this medicine. Severe falls or injuries can cause unseen bleeding. Be careful when using sharp tools or knives. Consider using an electric razor. Take special care brushing or flossing your teeth. Report any injuries, bruising, or red spots on the skin to your healthcare professional. If you are going to need surgery or other procedure, tell your healthcare professional that you are taking this medicine. Wear a medical ID bracelet   or chain. Carry a card that describes your disease and details of your medicine and dosage times. What side effects may I notice from receiving this medicine? Side effects that you should report to your doctor or health care professional as soon as possible:  allergic reactions like skin rash, itching or hives,  swelling of the face, lips, or tongue  signs and symptoms of bleeding such as bloody or black, tarry stools; red or dark-brown urine; spitting up blood or brown material that looks like coffee grounds; red spots on the skin; unusual bruising or bleeding from the eye, gums, or nose  signs and symptoms of a blood clot such as chest pain; shortness of breath; pain, swelling, or warmth in the leg  signs and symptoms of a stroke such as changes in vision; confusion; trouble speaking or understanding; severe headaches; sudden numbness or weakness of the face, arm or leg; trouble walking; dizziness; loss of coordination This list may not describe all possible side effects. Call your doctor for medical advice about side effects. You may report side effects to FDA at 1-800-FDA-1088. Where should I keep my medicine? Keep out of the reach of children. Store at room temperature between 20 and 25 degrees C (68 and 77 degrees F). Throw away any unused medicine after the expiration date. NOTE: This sheet is a summary. It may not cover all possible information. If you have questions about this medicine, talk to your doctor, pharmacist, or health care provider.  2020 Elsevier/Gold Standard (2018-03-23 17:39:34)   Rivaroxaban oral tablets What is this medicine? RIVAROXABAN (ri va ROX a ban) is an anticoagulant (blood thinner). It is used to treat blood clots in the lungs or in the veins. It is also used to prevent blood clots in the lungs or in the veins. It is also used to lower the chance of stroke in people with a medical condition called atrial fibrillation. This medicine may be used for other purposes; ask your health care provider or pharmacist if you have questions. COMMON BRAND NAME(S): Xarelto, Xarelto Starter Pack What should I tell my health care provider before I take this medicine? They need to know if you have any of these conditions:  antiphospholipid antibody syndrome  artificial heart  valve  bleeding disorders  bleeding in the brain  blood in your stools (black or tarry stools) or if you have blood in your vomit  history of blood clots  history of stomach bleeding  kidney disease  liver disease  low blood counts, like low white cell, platelet, or red cell counts  recent or planned spinal or epidural procedure  take medicines that treat or prevent blood clots  an unusual or allergic reaction to rivaroxaban, other medicines, foods, dyes, or preservatives  pregnant or trying to get pregnant  breast-feeding How should I use this medicine? Take this medicine by mouth with a glass of water. Follow the directions on the prescription label. Take your medicine at regular intervals. Do not take it more often than directed. Do not stop taking except on your doctor's advice. Stopping this medicine may increase your risk of a blood clot. Be sure to refill your prescription before you run out of medicine. If you are taking this medicine after hip or knee replacement surgery, take it with or without food. If you are taking this medicine for atrial fibrillation, take it with your evening meal. If you are taking this medicine to treat blood clots, take it with food at the same  time each day. If you are unable to swallow your tablet, you may crush the tablet and mix it in applesauce. Then, immediately eat the applesauce. You should eat more food right after you eat the applesauce containing the crushed tablet. Talk to your pediatrician regarding the use of this medicine in children. Special care may be needed. Overdosage: If you think you have taken too much of this medicine contact a poison control center or emergency room at once. NOTE: This medicine is only for you. Do not share this medicine with others. What if I miss a dose? If you take your medicine once a day and miss a dose, take the missed dose as soon as you remember. If it is almost time for your next dose, take only  that dose. Do not take double or extra doses. If you take your medicine twice a day and miss a dose, take the missed dose immediately. In this instance, 2 tablets may be taken at the same time. The next day you should take 1 tablet twice a day as directed. What may interact with this medicine? Do not take this medicine with any of the following medications:  defibrotide This medicine may also interact with the following medications:  aspirin and aspirin-like medicines  certain antibiotics like erythromycin, azithromycin, and clarithromycin  certain medicines for fungal infections like ketoconazole and itraconazole  certain medicines for irregular heart beat like amiodarone, quinidine, dronedarone  certain medicines for seizures like carbamazepine, phenytoin  certain medicines that treat or prevent blood clots like warfarin, enoxaparin, and dalteparin  conivaptan  felodipine  indinavir  lopinavir; ritonavir  NSAIDS, medicines for pain and inflammation, like ibuprofen or naproxen  ranolazine  rifampin  ritonavir  SNRIs, medicines for depression, like desvenlafaxine, duloxetine, levomilnacipran, venlafaxine  SSRIs, medicines for depression, like citalopram, escitalopram, fluoxetine, fluvoxamine, paroxetine, sertraline  St. John's wort  verapamil This list may not describe all possible interactions. Give your health care provider a list of all the medicines, herbs, non-prescription drugs, or dietary supplements you use. Also tell them if you smoke, drink alcohol, or use illegal drugs. Some items may interact with your medicine. What should I watch for while using this medicine? Visit your healthcare professional for regular checks on your progress. You may need blood work done while you are taking this medicine. Your condition will be monitored carefully while you are receiving this medicine. It is important not to miss any appointments. Avoid sports and activities that  might cause injury while you are using this medicine. Severe falls or injuries can cause unseen bleeding. Be careful when using sharp tools or knives. Consider using an Copy. Take special care brushing or flossing your teeth. Report any injuries, bruising, or red spots on the skin to your healthcare professional. If you are going to need surgery or other procedure, tell your healthcare professional that you are taking this medicine. Wear a medical ID bracelet or chain. Carry a card that describes your disease and details of your medicine and dosage times. What side effects may I notice from receiving this medicine? Side effects that you should report to your doctor or health care professional as soon as possible:  allergic reactions like skin rash, itching or hives, swelling of the face, lips, or tongue  back pain  redness, blistering, peeling or loosening of the skin, including inside the mouth  signs and symptoms of bleeding such as bloody or black, tarry stools; red or dark-brown urine; spitting up blood or brown  material that looks like coffee grounds; red spots on the skin; unusual bruising or bleeding from the eye, gums, or nose  signs and symptoms of a blood clot such as chest pain; shortness of breath; pain, swelling, or warmth in the leg  signs and symptoms of a stroke such as changes in vision; confusion; trouble speaking or understanding; severe headaches; sudden numbness or weakness of the face, arm or leg; trouble walking; dizziness; loss of coordination Side effects that usually do not require medical attention (report to your doctor or health care professional if they continue or are bothersome):  dizziness  muscle pain This list may not describe all possible side effects. Call your doctor for medical advice about side effects. You may report side effects to FDA at 1-800-FDA-1088. Where should I keep my medicine? Keep out of the reach of children. Store at room  temperature between 15 and 30 degrees C (59 and 86 degrees F). Throw away any unused medicine after the expiration date. NOTE: This sheet is a summary. It may not cover all possible information. If you have questions about this medicine, talk to your doctor, pharmacist, or health care provider.  2020 Elsevier/Gold Standard (2018-10-10 09:45:59)

## 2019-09-07 ENCOUNTER — Telehealth: Payer: Self-pay | Admitting: Family Medicine

## 2019-09-07 DIAGNOSIS — R2689 Other abnormalities of gait and mobility: Secondary | ICD-10-CM | POA: Diagnosis not present

## 2019-09-07 DIAGNOSIS — R279 Unspecified lack of coordination: Secondary | ICD-10-CM | POA: Diagnosis not present

## 2019-09-07 DIAGNOSIS — M5412 Radiculopathy, cervical region: Secondary | ICD-10-CM | POA: Diagnosis not present

## 2019-09-07 DIAGNOSIS — M6281 Muscle weakness (generalized): Secondary | ICD-10-CM | POA: Diagnosis not present

## 2019-09-07 DIAGNOSIS — R41841 Cognitive communication deficit: Secondary | ICD-10-CM | POA: Diagnosis not present

## 2019-09-07 NOTE — Assessment & Plan Note (Addendum)
I had a long discussion with the patient and her son.  I discussed Eliquis as an alternative to Coumadin.  I also discussed Xarelto as a potential alternative.  I advised that Xarelto and Eliquis work in a different way from Coumadin and that there is no testing needed to check for thinning of the blood.  I advised that there is lower risk of bleeding with Eliquis and Xarelto.  I also discussed that if she were to have significant bleeding there is no reversal agent for those 2 medications.  I advised that if she continues on Coumadin we would have to continue periodically checking her INR and that could be anywhere from once every week to every 4 weeks.  Discussed that there is a reversal agent for Coumadin though there is slightly higher risk of bleeding with Coumadin.  They were given information on Eliquis and Xarelto to review.  She will currently continue her Coumadin therapy.  They will let us know how she would like to proceed in the next several weeks.

## 2019-09-07 NOTE — Assessment & Plan Note (Signed)
Stable.  The patient's son will give me a copy of their denial letter.  If possible he could resubmit an appeal.

## 2019-09-07 NOTE — Assessment & Plan Note (Signed)
Form to be completed

## 2019-09-07 NOTE — Telephone Encounter (Signed)
Pt son dropped off Long term form to be filled out.  Placed in Dr. Ellen Henri color folder upfront

## 2019-09-07 NOTE — Telephone Encounter (Signed)
Picked up form dropped off by son and put in sign basket.  Kymberli Wiegand,cma

## 2019-09-07 NOTE — Progress Notes (Signed)
[name], MD Phone: [phone]  [name] is a 86 y.o. female who presents today for follow-up  History of PE: Patient has been on Coumadin for many years.  We have had fluctuating INRs recently and difficulty keeping her in range.  She does have an IVC filter in place.  She met with hematology last year to discuss potential treatment options.  They noted as long as she had the IVC filter in place she needed to be on anticoagulation.  They did recommend Eliquis as an option.  The patient was previously hesitant to consider this.  The patient would not feel comfortable having away to not check her blood to ensure that it is thin enough.  Unsteadiness: Patient has had chronic issues with unsteadiness.  She uses a cane to walk and feels as though she has poor balance.  She has had falls in the past.  She notes no pressure sores.  She notes she does not hold onto furniture or walls for mobility.  She feels as though a walker would help her be able to complete her ADLs more efficiently.  Dementia: Patient does have memory difficulties.  They applied for long-term care benefit reimbursement through her insurance though report this was denied.  The patient's son wrote a letter outlining all of the things that the patient has trouble doing.  This will be scanned into the chart.  Social History   Tobacco Use  Smoking Status Former Smoker  . Types: Cigarettes  . Quit date: 05/13/1999  . Years since quitting: 20.3  Smokeless Tobacco Never Used     ROS see history of present illness  Objective  Physical Exam Vitals:   09/06/19 1558  BP: 130/80  Pulse: 83  Temp: (!) 96.9 F (36.1 C)  SpO2: 95%    BP Readings from Last 3 Encounters:  09/06/19 130/80  08/22/19 (!) 152/80  08/15/19 110/80   Wt Readings from Last 3 Encounters:  09/06/19 120 lb (54.4 kg)  08/22/19 120 lb (54.4 kg)  08/15/19 119 lb 12.8 oz (54.3 kg)    Physical Exam Constitutional:      General: She is  not in acute distress.    Appearance: She is not diaphoretic.  Cardiovascular:     Rate and Rhythm: Normal rate and regular rhythm.     Heart sounds: Normal heart sounds.  Pulmonary:     Effort: Pulmonary effort is normal.     Breath sounds: Normal breath sounds.  Musculoskeletal:     Comments: Gait is slow and steady with cane, posture is mild to moderately stooped  Skin:    General: Skin is warm and dry.  Neurological:     Mental Status: She is alert.      Assessment/Plan: Please see individual problem list.  Dementia ([place]) Stable.  The patient's son will give me a copy of their denial letter.  If possible he could resubmit an appeal.  Gait instability Form to be completed.  History of pulmonary embolus (PE) I had a long discussion with the patient and her son.  I discussed Eliquis as an alternative to Coumadin.  I also discussed Xarelto as a potential alternative.  I advised that Xarelto and Eliquis work in a different way from Coumadin and that there is no testing needed to check for thinning of the blood.  I advised that there is lower risk of bleeding with Eliquis and Xarelto.  I also discussed that if she were to have significant bleeding there is  no reversal agent for those 2 medications.  I advised that if she continues on Coumadin we would have to continue periodically checking her INR and that could be anywhere from once every week to every 4 weeks.  Discussed that there is a reversal agent for Coumadin though there is slightly higher risk of bleeding with Coumadin.  They were given information on Eliquis and Xarelto to review.  She will currently continue her Coumadin therapy.  They will let us know how she would like to proceed in the next several weeks. I will have the CMA contact the patient's living facility to see if they can draw an INR.  No orders of the defined types were placed in this encounter.   No orders of the defined types were placed in this  encounter.   This visit occurred during the SARS-CoV-2 public health emergency.  Safety protocols were in place, including screening questions prior to the visit, additional usage of staff PPE, and extensive cleaning of exam room while observing appropriate contact time as indicated for disinfecting solutions.    [name], MD [place]

## 2019-09-08 NOTE — Progress Notes (Signed)
Called and spoke to [name] and [name] stated that her coumadin regimen [name] did not received it was from [facility] and I read the Rx to him and [name] stated [name] changed the blister pack and [name] is drawing her INR on Monday and her A1C.  Napoleon Monacelli,cma

## 2019-09-12 DIAGNOSIS — R279 Unspecified lack of coordination: Secondary | ICD-10-CM | POA: Diagnosis not present

## 2019-09-12 DIAGNOSIS — R41841 Cognitive communication deficit: Secondary | ICD-10-CM | POA: Diagnosis not present

## 2019-09-12 DIAGNOSIS — M5412 Radiculopathy, cervical region: Secondary | ICD-10-CM | POA: Diagnosis not present

## 2019-09-12 DIAGNOSIS — M6281 Muscle weakness (generalized): Secondary | ICD-10-CM | POA: Diagnosis not present

## 2019-09-12 DIAGNOSIS — R2689 Other abnormalities of gait and mobility: Secondary | ICD-10-CM | POA: Diagnosis not present

## 2019-09-13 DIAGNOSIS — M6281 Muscle weakness (generalized): Secondary | ICD-10-CM | POA: Diagnosis not present

## 2019-09-13 DIAGNOSIS — R2689 Other abnormalities of gait and mobility: Secondary | ICD-10-CM | POA: Diagnosis not present

## 2019-09-13 DIAGNOSIS — R41841 Cognitive communication deficit: Secondary | ICD-10-CM | POA: Diagnosis not present

## 2019-09-13 DIAGNOSIS — M5412 Radiculopathy, cervical region: Secondary | ICD-10-CM | POA: Diagnosis not present

## 2019-09-13 DIAGNOSIS — R279 Unspecified lack of coordination: Secondary | ICD-10-CM | POA: Diagnosis not present

## 2019-09-14 ENCOUNTER — Other Ambulatory Visit: Payer: Self-pay | Admitting: Family Medicine

## 2019-09-14 NOTE — Telephone Encounter (Signed)
Medication was discontinued by another office. Okay to refill?

## 2019-09-14 NOTE — Telephone Encounter (Signed)
It appears that the form might need to be completed by the son. I am happy to create a letter outlining all the things she can't do as outlined in his letter to me so it could be attached.

## 2019-09-14 NOTE — Telephone Encounter (Signed)
I called and spoke with the patent's son and let him know about the form, I informed him when it is ready I will call him

## 2019-09-14 NOTE — Telephone Encounter (Signed)
Please contact her living facility and see if she has been getting this from the pharmacy. Thanks.

## 2019-09-19 DIAGNOSIS — M5412 Radiculopathy, cervical region: Secondary | ICD-10-CM | POA: Diagnosis not present

## 2019-09-19 DIAGNOSIS — R2689 Other abnormalities of gait and mobility: Secondary | ICD-10-CM | POA: Diagnosis not present

## 2019-09-19 DIAGNOSIS — R41841 Cognitive communication deficit: Secondary | ICD-10-CM | POA: Diagnosis not present

## 2019-09-19 DIAGNOSIS — M6281 Muscle weakness (generalized): Secondary | ICD-10-CM | POA: Diagnosis not present

## 2019-09-19 DIAGNOSIS — R279 Unspecified lack of coordination: Secondary | ICD-10-CM | POA: Diagnosis not present

## 2019-09-20 ENCOUNTER — Other Ambulatory Visit: Payer: Self-pay | Admitting: Family Medicine

## 2019-09-26 DIAGNOSIS — M6281 Muscle weakness (generalized): Secondary | ICD-10-CM | POA: Diagnosis not present

## 2019-09-26 DIAGNOSIS — R279 Unspecified lack of coordination: Secondary | ICD-10-CM | POA: Diagnosis not present

## 2019-09-26 DIAGNOSIS — R2689 Other abnormalities of gait and mobility: Secondary | ICD-10-CM | POA: Diagnosis not present

## 2019-09-26 DIAGNOSIS — R41841 Cognitive communication deficit: Secondary | ICD-10-CM | POA: Diagnosis not present

## 2019-09-26 DIAGNOSIS — M5412 Radiculopathy, cervical region: Secondary | ICD-10-CM | POA: Diagnosis not present

## 2019-09-27 DIAGNOSIS — M6281 Muscle weakness (generalized): Secondary | ICD-10-CM | POA: Diagnosis not present

## 2019-09-27 DIAGNOSIS — R279 Unspecified lack of coordination: Secondary | ICD-10-CM | POA: Diagnosis not present

## 2019-09-27 DIAGNOSIS — R2689 Other abnormalities of gait and mobility: Secondary | ICD-10-CM | POA: Diagnosis not present

## 2019-09-27 DIAGNOSIS — R41841 Cognitive communication deficit: Secondary | ICD-10-CM | POA: Diagnosis not present

## 2019-09-27 DIAGNOSIS — M5412 Radiculopathy, cervical region: Secondary | ICD-10-CM | POA: Diagnosis not present

## 2019-09-28 DIAGNOSIS — M6281 Muscle weakness (generalized): Secondary | ICD-10-CM | POA: Diagnosis not present

## 2019-09-28 DIAGNOSIS — M5412 Radiculopathy, cervical region: Secondary | ICD-10-CM | POA: Diagnosis not present

## 2019-09-28 DIAGNOSIS — R41841 Cognitive communication deficit: Secondary | ICD-10-CM | POA: Diagnosis not present

## 2019-09-28 DIAGNOSIS — R279 Unspecified lack of coordination: Secondary | ICD-10-CM | POA: Diagnosis not present

## 2019-09-28 DIAGNOSIS — R2689 Other abnormalities of gait and mobility: Secondary | ICD-10-CM | POA: Diagnosis not present

## 2019-10-01 ENCOUNTER — Other Ambulatory Visit: Payer: Self-pay | Admitting: Family Medicine

## 2019-10-01 DIAGNOSIS — I10 Essential (primary) hypertension: Secondary | ICD-10-CM

## 2019-10-02 DIAGNOSIS — Z7901 Long term (current) use of anticoagulants: Secondary | ICD-10-CM | POA: Diagnosis not present

## 2019-10-02 DIAGNOSIS — R7309 Other abnormal glucose: Secondary | ICD-10-CM | POA: Diagnosis not present

## 2019-10-02 DIAGNOSIS — Z131 Encounter for screening for diabetes mellitus: Secondary | ICD-10-CM | POA: Diagnosis not present

## 2019-10-02 DIAGNOSIS — R739 Hyperglycemia, unspecified: Secondary | ICD-10-CM | POA: Diagnosis not present

## 2019-10-02 LAB — POCT INR: INR: 2.1 — AB (ref <=1.1)

## 2019-10-02 LAB — PROTIME-INR: Protime: 22.2 — AB (ref 10.0–13.8)

## 2019-10-02 LAB — HEMOGLOBIN A1C: Hemoglobin A1C: 5.3

## 2019-10-03 ENCOUNTER — Telehealth: Payer: Self-pay

## 2019-10-03 NOTE — Telephone Encounter (Signed)
Her INR is acceptable. She needs to continue with her current regimen and have this rechecked in 4 weeks.

## 2019-10-03 NOTE — Telephone Encounter (Signed)
I called and spoke with Madie Reno the RN at [facility] and informed him that the patient can continue her current regimen and to recheck in 4 weeks.  He understood.  Nina,cma

## 2019-10-04 ENCOUNTER — Telehealth: Payer: Self-pay | Admitting: Family Medicine

## 2019-10-04 NOTE — Telephone Encounter (Signed)
Pt son called he would like to talk to Gae Bon about his mothers medication

## 2019-10-05 DIAGNOSIS — R41841 Cognitive communication deficit: Secondary | ICD-10-CM | POA: Diagnosis not present

## 2019-10-05 DIAGNOSIS — R2689 Other abnormalities of gait and mobility: Secondary | ICD-10-CM | POA: Diagnosis not present

## 2019-10-05 DIAGNOSIS — M6281 Muscle weakness (generalized): Secondary | ICD-10-CM | POA: Diagnosis not present

## 2019-10-05 DIAGNOSIS — R279 Unspecified lack of coordination: Secondary | ICD-10-CM | POA: Diagnosis not present

## 2019-10-05 DIAGNOSIS — M5412 Radiculopathy, cervical region: Secondary | ICD-10-CM | POA: Diagnosis not present

## 2019-10-06 NOTE — Telephone Encounter (Signed)
The son of the patient called and he wanted to remind the provider that the patient is to start on April 1st taking eloquis instead of warfarin and he prefers the generic brand. He states she has a pill pack and this needs to be sent in prior to April 1 in order to be put in the pill pack and he is to stop the warfarin.  Patient states she want a RX for melatonin to help her sleep, she is having a hard time going to sleep.  The prescriptions need to be sent to [facility].  Nina,cma

## 2019-10-10 DIAGNOSIS — R2689 Other abnormalities of gait and mobility: Secondary | ICD-10-CM | POA: Diagnosis not present

## 2019-10-10 DIAGNOSIS — R279 Unspecified lack of coordination: Secondary | ICD-10-CM | POA: Diagnosis not present

## 2019-10-10 DIAGNOSIS — R41841 Cognitive communication deficit: Secondary | ICD-10-CM | POA: Diagnosis not present

## 2019-10-10 DIAGNOSIS — M5412 Radiculopathy, cervical region: Secondary | ICD-10-CM | POA: Diagnosis not present

## 2019-10-10 DIAGNOSIS — M6281 Muscle weakness (generalized): Secondary | ICD-10-CM | POA: Diagnosis not present

## 2019-10-11 DIAGNOSIS — M5412 Radiculopathy, cervical region: Secondary | ICD-10-CM | POA: Diagnosis not present

## 2019-10-11 DIAGNOSIS — R279 Unspecified lack of coordination: Secondary | ICD-10-CM | POA: Diagnosis not present

## 2019-10-11 DIAGNOSIS — R2689 Other abnormalities of gait and mobility: Secondary | ICD-10-CM | POA: Diagnosis not present

## 2019-10-11 DIAGNOSIS — R41841 Cognitive communication deficit: Secondary | ICD-10-CM | POA: Diagnosis not present

## 2019-10-11 DIAGNOSIS — M6281 Muscle weakness (generalized): Secondary | ICD-10-CM | POA: Diagnosis not present

## 2019-10-11 MED ORDER — APIXABAN 2.5 MG PO TABS: 2.5000 mg | ORAL_TABLET | Freq: Two times a day (BID) | ORAL | 11 refills | Status: DC

## 2019-10-11 NOTE — Telephone Encounter (Signed)
Pt's son was following up on below request.

## 2019-10-11 NOTE — Telephone Encounter (Signed)
I've sent the eliquis to the pharmacy. Please let the patients son know this. Please also contact the pharmacy and make sure they know that we are stopping the warfarin on the last day of March and the patient will be starting the eliquis on the first day of April.   Dosage was based on recommendations of Dr Grayland Ormond from the patients visit with him in 2020.

## 2019-10-11 NOTE — Addendum Note (Signed)
Addended by: [name] on: 10/11/2019 07:17 PM   Modules accepted: Orders

## 2019-10-12 NOTE — Telephone Encounter (Signed)
Patient's son returned call from office. Please call him at [phone].

## 2019-10-12 NOTE — Telephone Encounter (Signed)
I called and spoke with the patient's son and informed him that the prescription for Eloquis was sent to the pharmacy, I called and left a VM for [facility] informing them that she will stop the warfarin on march 31 and start the eliquis on April 1.  Paitlyn Mcclatchey,cma

## 2019-10-16 ENCOUNTER — Other Ambulatory Visit: Payer: Self-pay | Admitting: Family Medicine

## 2019-10-20 ENCOUNTER — Telehealth: Payer: Self-pay

## 2019-10-20 NOTE — Telephone Encounter (Signed)
A Occupational Therapy OT recerification/ monthly summary was faxed on 3:25 pm @ 3:53 pm to [facility]. Confirmation was given.  Analaura Messler,cma

## 2019-10-25 DIAGNOSIS — R279 Unspecified lack of coordination: Secondary | ICD-10-CM | POA: Diagnosis not present

## 2019-10-25 DIAGNOSIS — R41841 Cognitive communication deficit: Secondary | ICD-10-CM | POA: Diagnosis not present

## 2019-10-25 DIAGNOSIS — M5412 Radiculopathy, cervical region: Secondary | ICD-10-CM | POA: Diagnosis not present

## 2019-10-25 DIAGNOSIS — M6281 Muscle weakness (generalized): Secondary | ICD-10-CM | POA: Diagnosis not present

## 2019-10-25 DIAGNOSIS — R2689 Other abnormalities of gait and mobility: Secondary | ICD-10-CM | POA: Diagnosis not present

## 2019-10-26 ENCOUNTER — Telehealth: Payer: Self-pay | Admitting: Family Medicine

## 2019-10-26 NOTE — Telephone Encounter (Signed)
Left detailed message on son phone informing them of what was stated.

## 2019-10-26 NOTE — Telephone Encounter (Signed)
Pt called back and I let her know what Dr. [name] suggested she do. She said that makes sense and hung up.

## 2019-10-26 NOTE — Telephone Encounter (Signed)
She could be dehydrated given the weakness. With the diarrhea and weakness I would advise that she get looked at in person at an urgent care.

## 2019-10-26 NOTE — Telephone Encounter (Signed)
Patient has diarrhea for a week. Health care worker has given her some over the counter medication, but it doesn't seem to be working. She needs something stronger. Other symptom is weakness.

## 2019-11-01 ENCOUNTER — Telehealth: Payer: Self-pay | Admitting: Family Medicine

## 2019-11-01 NOTE — Telephone Encounter (Signed)
Pt son called back and said that she is still having diarrhea and wanted to do a culture.  Pt is scheduled with Marcellus Scott on Friday 4/9.   Mae Denunzio,cma

## 2019-11-01 NOTE — Telephone Encounter (Signed)
Pt's son called back and said that he would take her to Urgent Care tomorrow

## 2019-11-01 NOTE — Telephone Encounter (Signed)
Did she go to urgent care last week to get looked at? What other symptoms is she having now?

## 2019-11-01 NOTE — Telephone Encounter (Signed)
I called and spoke with the patient to ask if she had other symptoms, she is having H/a, diarrhea, and fatigue I informed the patient that she needed to go to urgent care because she needed to be tested for covid and the provider stated she needed to go to urgent care, the patient stated she was not going to urgent care and kept insisting that I tell her not to come to her appointment with Marcellus Scott on Friday I informed her that she could keep the appointment but it needed to be a virtual or a telephone visit she would not be allowed to enter the building on Friday.  She asked what time was her appointment and she got off the phone.  Omarian Jaquith,cma

## 2019-11-01 NOTE — Telephone Encounter (Signed)
Pt's son Gershon Mussel called and asked if he could pick up a list of her medications tomorrow afternoon. He also wants a list of all the Drs that she sees? Please call back

## 2019-11-01 NOTE — Telephone Encounter (Signed)
Pt son called back and said that she is still having diarrhea and wanted to do a culture.  Pt is scheduled with Marcellus Scott on Friday 4/9

## 2019-11-02 DIAGNOSIS — R197 Diarrhea, unspecified: Secondary | ICD-10-CM | POA: Diagnosis not present

## 2019-11-03 ENCOUNTER — Telehealth: Payer: Medicare Other | Admitting: Nurse Practitioner

## 2019-11-03 ENCOUNTER — Encounter: Payer: Self-pay | Admitting: Nurse Practitioner

## 2019-11-03 ENCOUNTER — Other Ambulatory Visit: Payer: Self-pay

## 2019-11-03 VITALS — Ht 59.0 in | Wt 120.0 lb

## 2019-11-03 DIAGNOSIS — K591 Functional diarrhea: Secondary | ICD-10-CM | POA: Diagnosis not present

## 2019-11-03 MED ORDER — METAMUCIL PO WAFR: 1.0000 | WAFER | ORAL | 0 refills | Status: AC

## 2019-11-03 NOTE — Patient Instructions (Addendum)
For your variable bowel movements that are mostly formed to loose and formed again- try adding a fiber like Metamucil to see if it can stabilize your stool consistency .   I ordered a cookie wafer that is fiber- Metamucil -  to take it at lunch. This will be at least 3 hours away from your morning medications so it should not interfere with your important morning medications.   This will be just a small dose of fiber and sometimes that helps to bulk up and firm up the stool a little bit more. If is causes loose stool or worse diarrhea- you will have to stop it. It is a harmless fiber supplement.  Try to avoid too many fruits as those set off your diarrhea. Look out for dairy, too many veggies, caffeine, fruit juices as these have been know to cause diarrhea.   You also have the Imodium ordered and active in your med list to take as needed on a really bad loose stool day. However, if you take Imodium too often, it can cause serious constipation.   If your diarrhea gets worse or you develop other concerning symptoms, we can do stool studies.  Follow-up with [name] as planned at the end of the month for now.  I have sent a message to the office administrator about your request to change providers.

## 2019-11-03 NOTE — Progress Notes (Signed)
Virtual Visit via Video Note  I connected with [name] on 11/03/19 at 11:00 AM EDT by a video enabled telemedicine application and verified that I am speaking with the correct person using two identifiers. This visit type was conducted due to national recommendations for restrictions regarding the COVID-19 Pandemic (e.g. social distancing).  This format is felt to be most appropriate for this patient at this time.   I discussed the limitations of evaluation and management by telemedicine and the availability of in person appointments. The patient expressed understanding and agreed to proceed.  The patient and her son [name] and myself were on today's video visit. The patient was at home at [facility] and I was in my office at the time of today's visit.   History of Present Illness:  [name] presents for chief complaint of intermittent spells of diarrhea and she needs to have a medicine to use as needed. This has been a chronic problem and previously evaluated by GI in 2019. The majority of the time she has formed stool.   Her bowel habits are mostly formed with episodes- a few times a week of loose movements x2 per day. Sometimes she has 3 BMs. Less often watery.  Some days she feels like she goes back and forth in the bathroom x3. She cannot identify a pattern, except that fruit will cause this. She eats a lot of bananas as she thinks that helps. She has noted no blood or melena, abdominal pain or cramps, unexplained weight loss. No nocturnal diarrhea.She does have Imodium ordered at [facility] to take as needed.  She says when she takes it does work. She reports she has lost some wt since she moved to independent living [place]. Her appetite is not as good as it used to be. She has yogurt and enjoys that. She enjoys fruit.   Meds ordered this encounter  Medications  . Psyllium (METAMUCIL) WAFER    Sig: Take 1 Wafer by mouth PC lunch. If stools become  too loose, hold it. This is to try to bulk up the stool so it has  more of a consistent form.    Dispense:  30 Wafer    Refill:  0    Order Specific Question:   Supervising Provider    Answer:   Einar Pheasant [id number]    Observations/Objective: Gen: Awake, alert, no acute distress Resp: Breathing is even and non-labored Psych: Angry at times talking about dissatisfaction with her medical provider/ calms and is more relaxed and conversational. Overall impression is irritable. Neuro: Alert and Oriented x 3, + facial symmetry, speech is clear.  Assessment and Plan:  Functional diarrhea She gets a blister pack of her medications delivered to her.  I will check with the RN there, [name] as to how they handle the as needed medications. Does she have to give herself the Imodium?  She tells me that she is not getting as needed anti-diarrheal.   I had talked to [name] and her son, [name] today (he was in the video with the patient) about trying Metamucil as a fiber/bulking agent  to see if we can help her from having stool extremes. Sometimes, it does work well for both extremes, diarrhea -if functional- and constipation. If it causes more loose stool, then will have to stop. She was advised to eat her yogurt that she has in her room.  Avoid known fruit triggers. Banana is a binder and that should be  OK. If Metamucil does not work, Doctor, general practice for a 30 day trial may help.   She does not give any alarm signs.  Monitor weight.Metamucil wafers were sent to her [facility] and advised to take one wafer at lunch as to not bind with any of her morning meds.   She was also advised to avoid too many fruits as those set off  diarrhea. Look out for dairy, too many veggies, caffeine, fruit juices as these have been know to cause diarrhea.   You also have the Imodium ordered and active in your med list to take on a really bad loose stool day.  She has a f/up with Dr. [name] in a few  weeks. The patient verbalized adamantly that she is not happy with her relationship with Dr. [name] because he will not give her medicine to help her diarrhea.  The patient stated she would like to seek another primary provider and asks how she can do that in this clinic. I will forward her request to the office administrator, [name] for her recommendation.   Follow Up Instructions: Follow-up with Dr. [name] as planned at the end of the month for now.  I have sent a message to the office administrator about your request to change providers.   I discussed the assessment and treatment plan with the patient. The patient was provided an opportunity to ask questions and all were answered. The patient agreed with the plan and demonstrated an understanding of the instructions.   The patient was advised to call back or seek an in-person evaluation if the symptoms worsen or if the condition fails to improve as anticipated.   [name], NP

## 2019-11-04 ENCOUNTER — Encounter: Payer: Self-pay | Admitting: Nurse Practitioner

## 2019-11-07 DIAGNOSIS — M6281 Muscle weakness (generalized): Secondary | ICD-10-CM | POA: Diagnosis not present

## 2019-11-07 DIAGNOSIS — R2689 Other abnormalities of gait and mobility: Secondary | ICD-10-CM | POA: Diagnosis not present

## 2019-11-07 DIAGNOSIS — M5412 Radiculopathy, cervical region: Secondary | ICD-10-CM | POA: Diagnosis not present

## 2019-11-10 NOTE — Telephone Encounter (Signed)
Patient had a virtual visit with [name].  Shantae Vantol,cma  

## 2019-11-13 ENCOUNTER — Telehealth: Payer: Self-pay | Admitting: Family Medicine

## 2019-11-13 NOTE — Telephone Encounter (Signed)
Pt called and said that she is still having diarrhea this has been going on for the last 2 weeks and she is worn out. Pt wanted something else called in to help.  She also has an appt scheduled for tomorrow and wants to come in. I told her I would have to get that approved since she has diarrhea.  Loris Winrow,cma

## 2019-11-13 NOTE — Telephone Encounter (Signed)
[name] has an appointment tomorrow 11/14/2019 and she stated her Prolia is due in 3 days and she wants to know if you would allow her to get it at her appt on 11/14/2019 @ 12 pm so she would not have to come back.  Please advise.  Saoirse Legere,cma

## 2019-11-13 NOTE — Telephone Encounter (Signed)
Please see if the patient has any other symptoms such as respiratory complaints? If she does not have any of the other screening symptoms then I would be ok with her coming in to the office given the duration of her symptoms.

## 2019-11-13 NOTE — Telephone Encounter (Signed)
Pt called and said that she is still having diarrhea this has been going on for the last 2 weeks and she is worn out. Pt wanted something else called in to help.  She also has an appt scheduled for tomorrow and wants to come in. I told her I would have to get that approved since she has diarrhea

## 2019-11-14 ENCOUNTER — Ambulatory Visit (INDEPENDENT_AMBULATORY_CARE_PROVIDER_SITE_OTHER): Payer: Medicare Other | Admitting: Family Medicine

## 2019-11-14 ENCOUNTER — Other Ambulatory Visit: Payer: Self-pay

## 2019-11-14 ENCOUNTER — Encounter: Payer: Self-pay | Admitting: Family Medicine

## 2019-11-14 DIAGNOSIS — Z86711 Personal history of pulmonary embolism: Secondary | ICD-10-CM | POA: Diagnosis not present

## 2019-11-14 DIAGNOSIS — F419 Anxiety disorder, unspecified: Secondary | ICD-10-CM | POA: Diagnosis not present

## 2019-11-14 DIAGNOSIS — K591 Functional diarrhea: Secondary | ICD-10-CM

## 2019-11-14 MED ORDER — MIRTAZAPINE 7.5 MG PO TABS: 7.5000 mg | ORAL_TABLET | Freq: Every day | ORAL | 3 refills | Status: DC

## 2019-11-14 MED ORDER — APIXABAN 2.5 MG PO TABS: 2.5000 mg | ORAL_TABLET | Freq: Two times a day (BID) | ORAL | 3 refills | Status: DC

## 2019-11-14 NOTE — Patient Instructions (Signed)
Nice to see you. Please try to avoid the foods on the list given to you if you eat them frequently.  We will see if this helps with your diarrhea. You can take the Imodium if needed.

## 2019-11-14 NOTE — Telephone Encounter (Signed)
Insurance may not pay if given early she would need to clarify with insurance.

## 2019-11-14 NOTE — Assessment & Plan Note (Signed)
Symptoms seem most consistent with IBS diarrhea predominant type versus food intolerance.  She alternates normal bowel movements with loose stools.  Discussed FODMAP diet with the patient.  Discussed progressively cutting foods helped so that she can identify what is bothering her.  I suggested she see if she can order her food at the facility without the sauces.  Advised she could take the Imodium as needed for diarrhea.  Encouraged her to contact us if this is not improving.

## 2019-11-14 NOTE — Telephone Encounter (Signed)
I called the patient and LVM for the patient to call her insurance because they will not pay for prolia if it is more than a day early.  [name],cma

## 2019-11-14 NOTE — Assessment & Plan Note (Addendum)
We will trial Remeron.  Discussed this could make her drowsy so she will take it at night.

## 2019-11-14 NOTE — Assessment & Plan Note (Signed)
Continue on Eliquis 

## 2019-11-14 NOTE — Telephone Encounter (Signed)
I spoke to the patient at her visit today and she stated that her Prolia was not due til next week and she can wait.  Heru Montz,cma

## 2019-11-14 NOTE — Progress Notes (Signed)
[name], MD Phone: [phone]  [name] is a 86 y.o. female who presents today for follow-up.  Functional diarrhea: Patient notes this has been an ongoing intermittent issue.  She saw one of our nurse practitioners several weeks ago and they started her on Metamucil though she notes that made things worse.  She notes no abdominal pain.  No blood in her stool.  She has loose stools in the morning though notes more formed later in the day.  Typically depends on what she eats.  Spicy food and sauces seem to set it off.  She has tried cutting down on her sugars.  She is off chocolate now.  She had a GI evaluation for this in 2019 and had stool studies and CT abdomen and pelvis which did not reveal a specific cause.  It was felt at that time to be related to dietary issues.  History of PE: Recently switched over to Eliquis.  She notes some slight increase in bruising though nothing significant.  No significant bleeding.  Anxiety: Patient son reports her anxiety has gotten worse.  She does note some depression related to the pandemic.  No SI.  She has been taking melatonin reports sleeping okay.  Social History   Tobacco Use  Smoking Status Former Smoker  . Types: Cigarettes  . Quit date: 05/13/1999  . Years since quitting: 20.5  Smokeless Tobacco Never Used     ROS see history of present illness  Objective  Physical Exam Vitals:   11/14/19 1202  BP: 140/80  Pulse: 82  Temp: (!) 97.3 F (36.3 C)  SpO2: 95%    BP Readings from Last 3 Encounters:  11/14/19 140/80  09/06/19 130/80  08/22/19 (!) 152/80   Wt Readings from Last 3 Encounters:  11/14/19 117 lb (53.1 kg)  11/03/19 120 lb (54.4 kg)  09/06/19 120 lb (54.4 kg)    Physical Exam Constitutional:      General: She is not in acute distress.    Appearance: She is not diaphoretic.  Cardiovascular:     Rate and Rhythm: Normal rate and regular rhythm.     Heart sounds: Normal heart sounds.  Pulmonary:   Effort: Pulmonary effort is normal.     Breath sounds: Normal breath sounds.  Abdominal:     General: Bowel sounds are normal. There is no distension.     Palpations: Abdomen is soft.     Tenderness: There is no abdominal tenderness. There is no guarding or rebound.  Musculoskeletal:     Right lower leg: No edema.     Left lower leg: No edema.  Skin:    General: Skin is warm and dry.  Neurological:     Mental Status: She is alert.      Assessment/Plan: Please see individual problem list.  Diarrhea Symptoms seem most consistent with IBS diarrhea predominant type versus food intolerance.  She alternates normal bowel movements with loose stools.  Discussed FODMAP diet with the patient.  Discussed progressively cutting foods helped so that she can identify what is bothering her.  I suggested she see if she can order her food at the facility without the sauces.  Advised she could take the Imodium as needed for diarrhea.  Encouraged her to contact us if this is not improving.  Anxiety We will trial Remeron.  Discussed this could make her drowsy so she will take it at night.  History of pulmonary embolus (PE) Continue on Eliquis.   No orders of the  defined types were placed in this encounter.   Meds ordered this encounter  Medications  . mirtazapine (REMERON) 7.5 MG tablet    Sig: Take 1 tablet (7.5 mg total) by mouth at bedtime.    Dispense:  30 tablet    Refill:  3    Please start on May 1st 2021  . apixaban (ELIQUIS) 2.5 MG TABS tablet    Sig: Take 1 tablet (2.5 mg total) by mouth 2 (two) times daily.    Dispense:  180 tablet    Refill:  3    Discontinue warfarin    This visit occurred during the SARS-CoV-2 public health emergency.  Safety protocols were in place, including screening questions prior to the visit, additional usage of staff PPE, and extensive cleaning of exam room while observing appropriate contact time as indicated for disinfecting solutions.    [name], MD [place]

## 2019-11-15 DIAGNOSIS — R2689 Other abnormalities of gait and mobility: Secondary | ICD-10-CM | POA: Diagnosis not present

## 2019-11-15 DIAGNOSIS — M6281 Muscle weakness (generalized): Secondary | ICD-10-CM | POA: Diagnosis not present

## 2019-11-15 DIAGNOSIS — M5412 Radiculopathy, cervical region: Secondary | ICD-10-CM | POA: Diagnosis not present

## 2019-11-17 ENCOUNTER — Other Ambulatory Visit: Payer: Self-pay | Admitting: Family Medicine

## 2019-11-17 ENCOUNTER — Telehealth: Payer: Self-pay | Admitting: Family Medicine

## 2019-11-17 DIAGNOSIS — K591 Functional diarrhea: Secondary | ICD-10-CM

## 2019-11-17 NOTE — Telephone Encounter (Signed)
She can take imodium 2 mg up to 4 times per days for the diarrhea. I would like for her to complete stool studies. We completed these for her diarrhea a couple years ago and I have a low suspicion that there is anything infectious going on, though we could check these again to make sure. I have placed orders for this. Please see if they can pick up the collection materials and then return them to the lab at the hospital. If she develops abdominal pain, fevers, or blood in her stool she needs to be seen in person. If the imodium is not helpful we could try lomotil. Thanks.

## 2019-11-17 NOTE — Telephone Encounter (Signed)
I called and spoke to the patient's son and informed him that the provider wanted to do stool studies on the patient and he would need to come to the office and pick up stool study kits for the patient to do a stool study and he needed to once finished drop the study off at the [facility] lab in the medical mall at the hospital.  I then also spoke with the patient and informed her how to take the immodium for her diarrhea and that she would have to do a stool study but if she has any stomach pains, fever or blood in her stool she needs to make [place] aware. If the immodium does not help to call us back and the provider stated he would try Lomotil.  Gae Bon, cma

## 2019-11-17 NOTE — Telephone Encounter (Signed)
Pt states that she is still having diarrhea. She states that she has been awake all night. She also says that she took an immodium several days ago and wants to know what else she can do.

## 2019-11-21 ENCOUNTER — Ambulatory Visit (INDEPENDENT_AMBULATORY_CARE_PROVIDER_SITE_OTHER): Payer: Medicare Other | Admitting: *Deleted

## 2019-11-21 ENCOUNTER — Telehealth: Payer: Self-pay | Admitting: Family Medicine

## 2019-11-21 ENCOUNTER — Other Ambulatory Visit: Payer: Self-pay

## 2019-11-21 DIAGNOSIS — M8580 Other specified disorders of bone density and structure, unspecified site: Secondary | ICD-10-CM | POA: Diagnosis not present

## 2019-11-21 MED ORDER — DENOSUMAB 60 MG/ML ~~LOC~~ SOSY
60.0000 mg | PREFILLED_SYRINGE | Freq: Once | SUBCUTANEOUS | Status: AC
  Administered 2019-11-21: 60 mg via SUBCUTANEOUS

## 2019-11-21 NOTE — Telephone Encounter (Signed)
Son brought in pt's meal diary. Placed in folder up front

## 2019-11-21 NOTE — Progress Notes (Signed)
Patient presented for Prolia injection to right arm [place], patient voiced no concerns or complaints during or after injection.

## 2019-11-21 NOTE — Telephone Encounter (Signed)
I put the patient's food diary for your review in the lab basket.  Anasofia Micallef,cma

## 2019-11-23 ENCOUNTER — Other Ambulatory Visit
Admission: RE | Admit: 2019-11-23 | Discharge: 2019-11-23 | Disposition: A | Discharge status: Home / Self Care | Payer: Medicare Other | Source: Ambulatory Visit | Attending: Family Medicine | Admitting: Family Medicine

## 2019-11-23 DIAGNOSIS — K591 Functional diarrhea: Secondary | ICD-10-CM | POA: Insufficient documentation

## 2019-11-23 LAB — C DIFFICILE QUICK SCREEN W PCR REFLEX
C Diff antigen: NEGATIVE
C Diff interpretation: NOT DETECTED
C Diff toxin: NEGATIVE

## 2019-11-23 NOTE — Telephone Encounter (Signed)
Noted. Is she still having diarrhea? Has she been able to cut out foods that seem to cause her diarrhea? If she is still having difficulty with this we could try lomotil, though we would need to make sure someone was helping with her as needed medications. Thanks.

## 2019-11-23 NOTE — Telephone Encounter (Signed)
Noted  

## 2019-11-23 NOTE — Telephone Encounter (Signed)
I called and spoke with the patient and she stated she had only one this morning and they did take that to be testd from the test you ordered, but it is slowing up she stated she wanted to see what the results were before she tries any other medication or change her foods.  [name],cma

## 2019-11-27 LAB — GI PATHOGEN PANEL BY PCR, STOOL

## 2019-11-28 ENCOUNTER — Other Ambulatory Visit: Payer: Self-pay | Admitting: Family Medicine

## 2019-11-28 MED ORDER — DIPHENOXYLATE-ATROPINE 2.5-0.025 MG PO TABS: 1.0000 | ORAL_TABLET | Freq: Four times a day (QID) | ORAL | 0 refills | Status: DC | PRN

## 2019-12-08 ENCOUNTER — Other Ambulatory Visit: Payer: Self-pay | Admitting: Family Medicine

## 2019-12-13 DIAGNOSIS — H16223 Keratoconjunctivitis sicca, not specified as Sjogren's, bilateral: Secondary | ICD-10-CM | POA: Diagnosis not present

## 2020-01-01 ENCOUNTER — Telehealth: Payer: Self-pay

## 2020-01-01 ENCOUNTER — Telehealth: Payer: Self-pay | Admitting: Family Medicine

## 2020-01-01 NOTE — Telephone Encounter (Signed)
FYI---Caller reports she has had diarrhea for the past month. She is asking for information sheet on each of her medications. She wants to be able to tell them apart.   [place] RECORD [facility] Patient Name: [name] Gender: Female DOB: Nov 10, 1925 Age: 86 Y 2 M 20 D Return Phone Number: [phone] (Primary) Address: City/State/Zip: [place] Client [place] Client Site [place] - Day Physician [name] - MD Contact Type Call Who Is Calling Patient / Member / Family / Caregiver Call Type Triage / Clinical Relationship To Patient Self Return Phone Number [phone] (Primary) Chief Complaint Diarrhea Reason for Call Symptomatic / Request for [place] states she's having diarrhea, has had it over a month. Has questions about the eight medications she's on. Translation No Nurse Assessment Nurse: Durene Cal, RN, Brandi Date/Time (Eastern Time): 12/30/2019 10:17:40 AM Confirm and document reason for call. If symptomatic, describe symptoms. ---Caller reports she has had diarrhea for the past month. She is asking for information sheet on each of her medications. She wants to be able to tell them apart. Has the patient had close contact with a person known or suspected to have the novel coronavirus illness OR traveled / lives in area with major community spread (including international travel) in the last 14 days from the onset of symptoms? * If Asymptomatic, screen for exposure and travel within the last 14 days. ---No Does the patient have any new or worsening symptoms? ---Yes Will a triage be completed? ---Yes Related visit to physician within the last 2 weeks? ---N/A Does the PT have any chronic conditions? (i.e. diabetes, asthma, this includes High risk factors for pregnancy, etc.) ---Unknown Is this  a behavioral health or substance abuse call? ---No Guidelines Guideline Title Affirmed Question Affirmed Notes Nurse Date/Time (Eastern Time) Diarrhea [1] MILD diarrhea (e.g., 1-3 or more stools than normal in past 24 hours) without known cause AND [2] present > 7 days Durene Cal, RN, Velna Hatchet 12/30/2019 10:23:41 AM Disp. Time (Eastern Time) Disposition Final UserPLEASE NOTE: All timestamps contained within this report are represented as [place] Standard Time. CONFIDENTIALTY NOTICE: This fax transmission is intended only for the addressee. It contains information that is legally privileged, confidential or otherwise protected from use or disclosure. If you are not the intended recipient, you are strictly prohibited from reviewing, disclosing, copying using or disseminating any of this information or taking any action in reliance on or regarding this information. If you have received this fax in error, please notify us immediately by telephone so that we can arrange for its return to [place]. Phone: [phone], Toll-Free: [phone], Fax: [phone] Page: 2 of 2 Call Id: [id number] 12/30/2019 10:40:51 AM SEE PCP WITHIN 3 DAYS Yes Durene Cal, RN, Velna Hatchet Caller Disagree/Comply Comply Caller Understands Yes PreDisposition Home Care Care Advice Given Per Guideline SEE PCP WITHIN 3 DAYS: * You need to be seen within 2 or 3 days. * Wash your hands before fixing or eating food. * Wash your hands after using the bathroom. * Don't swim for 2 weeks after diarrhea is gone. * Bloody stools * Constant or severe abdominal pain Comments User: Ermalene Postin, RN Date/Time Eilene Ghazi Time): 12/30/2019 10:21:21 AM Reports the information sheets in her medication boxes are too small for her to see. REports she has taken Imodium without relief. User: Ermalene Postin, RN Date/Time Eilene Ghazi Time): 12/30/2019 10:22:07 AM Reports there is a new medication  that she would like to try starts with ST for irritable bowel. User:  Ermalene Postin, RN Date/Time Eilene Ghazi Time): 12/30/2019 10:24:22 AM Weight: 114 User: Ermalene Postin, RN Date/Time Eilene Ghazi Time): 12/30/2019 10:29:49 AM REprots she is taking multiple vitamins User: Ermalene Postin, RN Date/Time Eilene Ghazi Time): 12/30/2019 10:31:09 AM Reports she did see a provider a month ago and reports she was encouraged to see a GI md. Reports she would like to have a Referral from her pcp asap. User: Ermalene Postin, RN Date/Time Eilene Ghazi Time): 12/30/2019 10:36:41 AM Current medication losartan vitamin d3 2000units biotin 500mg  eliquis 2.5mg  daily every am metoprolol tart 50mg  montelukast 10mg  User: Ermalene Postin, RN Date/Time Eilene Ghazi Time): 12/30/2019 10:37:48 AM Caller would like to pick up medication information sheets by tuesday afternoon. if possible. User: Ermalene Postin, RN Date/Time Eilene Ghazi Time): 12/30/2019 10:41:57 AM Caller son states he will call on Monday to seek an appt for her as well as med information sheets. Referrals REFERRED TO PCP OFFICE

## 2020-01-01 NOTE — Telephone Encounter (Signed)
Please call patient's son. He wants to talk about his mother's medication and the request from when she called Saturday.

## 2020-01-01 NOTE — Telephone Encounter (Signed)
She should be able to get this from the pharmacy. Please have her contact the pharmacy regarding this.

## 2020-01-02 NOTE — Telephone Encounter (Signed)
I called and informed the patient's son that he could get the medication information sheets from the pharmacy I left this on his VM.  Arieana Somoza,cma

## 2020-01-05 ENCOUNTER — Telehealth: Payer: Self-pay | Admitting: Family Medicine

## 2020-01-05 NOTE — Telephone Encounter (Signed)
Pt said recently stopped coumadin medication to start taking new medication. She wants someone at [facility] to do some blood work. She wants to know if new medication is working at keeping her blood pressure stable. Ovella Manygoats,cma

## 2020-01-05 NOTE — Telephone Encounter (Signed)
Pt said recently stopped coumadin medication to start taking new medication. She wants someone at [facility] to do some blood work. She wants to know if new medication is working at keeping her blood pressure stable.

## 2020-01-05 NOTE — Telephone Encounter (Signed)
She should be on eliquis at this time. There is no blood test to check with this type of blood thinner. As long as she is taking it daily as prescribed then her blood will be thinned by the medication.

## 2020-01-05 NOTE — Telephone Encounter (Signed)
Called and LVM for the patient to call back for answer to questions asked this morning.  Jahmar Mckelvy,cma

## 2020-01-06 ENCOUNTER — Other Ambulatory Visit: Payer: Self-pay | Admitting: Family Medicine

## 2020-01-25 NOTE — Telephone Encounter (Signed)
I called and left a message with [name] informing her that the [facility] was [facility] and he needed to call them.  I tried calling [facility] and got a automated system no live person.  Sameera Betton,cma

## 2020-01-25 NOTE — Telephone Encounter (Signed)
Pt's son called and would like a call back in about 2 hours if possible. He states that she needed a refill on apixaban (ELIQUIS) 2.5 MG TABS tablet and she has not received it? Asked to speak to you. Please advise

## 2020-02-09 ENCOUNTER — Telehealth: Payer: Self-pay | Admitting: Family Medicine

## 2020-02-09 NOTE — Telephone Encounter (Signed)
Signed.

## 2020-02-09 NOTE — Telephone Encounter (Signed)
Patient's son is requesting a handicap sticker for the patients I put it in the sign basket.  Rajiv Parlato,cma

## 2020-02-09 NOTE — Telephone Encounter (Signed)
I could not reach the patient's son but LVM, I called the patient and she states she does walk with a can and she can walk 200 feet without stopping with the cane, she does not believe she could do that without er cane.   Caeleigh Prohaska,cma

## 2020-02-09 NOTE — Telephone Encounter (Signed)
Took signed handicap form to front for patient to pick up.  Reannon Candella,cma

## 2020-02-09 NOTE — Telephone Encounter (Signed)
Please confirm if she walks with a cane or walker most of the time. Please also see if she can walk more than 200 feet with out stopping. Thanks.

## 2020-02-09 NOTE — Telephone Encounter (Signed)
Pt called in about his wife need a handicapp sticker for her need paperwork filled out will pick it up at appointment wednesday

## 2020-02-13 ENCOUNTER — Ambulatory Visit: Payer: Medicare Other | Admitting: Family Medicine

## 2020-02-13 DIAGNOSIS — Z85828 Personal history of other malignant neoplasm of skin: Secondary | ICD-10-CM | POA: Diagnosis not present

## 2020-02-13 DIAGNOSIS — L853 Xerosis cutis: Secondary | ICD-10-CM | POA: Diagnosis not present

## 2020-02-13 DIAGNOSIS — L570 Actinic keratosis: Secondary | ICD-10-CM | POA: Diagnosis not present

## 2020-02-13 DIAGNOSIS — Z08 Encounter for follow-up examination after completed treatment for malignant neoplasm: Secondary | ICD-10-CM | POA: Diagnosis not present

## 2020-02-13 DIAGNOSIS — L821 Other seborrheic keratosis: Secondary | ICD-10-CM | POA: Diagnosis not present

## 2020-02-14 ENCOUNTER — Other Ambulatory Visit: Payer: Self-pay

## 2020-02-14 ENCOUNTER — Ambulatory Visit (INDEPENDENT_AMBULATORY_CARE_PROVIDER_SITE_OTHER): Payer: Medicare Other | Admitting: Family Medicine

## 2020-02-14 DIAGNOSIS — F330 Major depressive disorder, recurrent, mild: Secondary | ICD-10-CM | POA: Diagnosis not present

## 2020-02-14 DIAGNOSIS — I10 Essential (primary) hypertension: Secondary | ICD-10-CM | POA: Diagnosis not present

## 2020-02-14 DIAGNOSIS — M25471 Effusion, right ankle: Secondary | ICD-10-CM

## 2020-02-14 DIAGNOSIS — M25472 Effusion, left ankle: Secondary | ICD-10-CM

## 2020-02-14 DIAGNOSIS — K591 Functional diarrhea: Secondary | ICD-10-CM

## 2020-02-14 DIAGNOSIS — Z86711 Personal history of pulmonary embolism: Secondary | ICD-10-CM | POA: Diagnosis not present

## 2020-02-14 LAB — BASIC METABOLIC PANEL
BUN: 12 mg/dL (ref 6–23)
CO2: 26 mEq/L (ref 19–32)
Calcium: 9.4 mg/dL (ref 8.4–10.5)
Chloride: 105 mEq/L (ref 96–112)
Creatinine, Ser: 0.96 mg/dL (ref 0.40–1.20)
GFR: 54.31 mL/min — ABNORMAL LOW (ref >60.00)
Glucose, Bld: 87 mg/dL (ref 70–99)
Potassium: 4.4 mEq/L (ref 3.5–5.1)
Sodium: 140 mEq/L (ref 135–145)

## 2020-02-14 NOTE — Patient Instructions (Signed)
Nice to see you. Please discontinue the Singulair, biotin, and trazodone. We will see if her diarrhea improves with coming off of the biotin. We will check labs today and contact you with the results. It is okay to occasionally take the Lomotil for your diarrhea.

## 2020-02-14 NOTE — Progress Notes (Signed)
[name] Rumps, MD Phone: [phone]  Lusine Corlett is a 86 y.o. female who presents today for f/u.  History of PE: No chest pain or shortness of breath.  Taking Eliquis.  No bleeding issues.  No missed doses.  Chronic diarrhea: She intermittently has issues with this.  She will occasionally take Lomotil with good benefit.  She typically will take it every other day if her son will get it for her.  She notes she read something about biotin contributing to diarrhea.  Hypertension: No chest pain or shortness of breath.  She is taking losartan metoprolol.  She does note some bilateral ankle puffiness that has been a chronic ongoing issue.  No orthopnea or PND.  Depression: Patient does note some depression.  She wonders if the Singulair is playing a role.  She notes on several occasions she has woken up in the middle of night and found herself standing up busy filling her cart up to go somewhere.  She denies SI.  She is on Remeron.  She has not been taking trazodone as melatonin helps her sleep.  Social History   Tobacco Use  Smoking Status Former Smoker  . Types: Cigarettes  . Quit date: 05/13/1999  . Years since quitting: 20.7  Smokeless Tobacco Never Used     ROS see history of present illness  Objective  Physical Exam Vitals:   02/14/20 1117  BP: 130/80  Pulse: 83  Temp: 97.6 F (36.4 C)  SpO2: 95%    BP Readings from Last 3 Encounters:  02/14/20 130/80  11/14/19 140/80  09/06/19 130/80   Wt Readings from Last 3 Encounters:  02/14/20 115 lb 12.8 oz (52.5 kg)  11/14/19 117 lb (53.1 kg)  11/03/19 120 lb (54.4 kg)    Physical Exam Constitutional:      General: She is not in acute distress.    Appearance: She is not diaphoretic.  Cardiovascular:     Rate and Rhythm: Normal rate and regular rhythm.     Heart sounds: Normal heart sounds.  Pulmonary:     Effort: Pulmonary effort is normal.     Breath sounds: Normal breath sounds.  Abdominal:     General:  Bowel sounds are normal. There is no distension.     Palpations: Abdomen is soft.     Tenderness: There is no abdominal tenderness. There is no guarding or rebound.  Musculoskeletal:     Right lower leg: No edema.     Left lower leg: No edema.  Skin:    General: Skin is warm and dry.  Neurological:     Mental Status: She is alert.      Assessment/Plan: Please see individual problem list.  History of pulmonary embolus (PE) Stable.  Continue Eliquis.  Discussed with her that there is no lab test to test whether or not the Eliquis is working.  As long as she takes it twice daily and does not miss any doses we know that it should be thinning her blood.  Diarrhea It is okay for her to occasionally take Lomotil given benefit to her quality of life.  She will stop the biotin and will see if that makes a difference.  Depression Does note some depression.  Singulair may be playing a role.  No SI.  We will stop the Singulair and see if this improves.  Continue Remeron.  Ankle edema, bilateral Discussed this is likely related to venous insufficiency.  Discussed elevating her legs and wearing compression hose which she  has several pairs at home.  She will try to walk a little bit more as well.  Hypertension Adequately controlled.  Check BMP.   Orders Placed This Encounter  Procedures  . Basic Metabolic Panel (BMET)    No orders of the defined types were placed in this encounter.   This visit occurred during the SARS-CoV-2 public health emergency.  Safety protocols were in place, including screening questions prior to the visit, additional usage of staff PPE, and extensive cleaning of exam room while observing appropriate contact time as indicated for disinfecting solutions.    [name] Rumps, MD [place]

## 2020-02-14 NOTE — Assessment & Plan Note (Signed)
Stable.  Continue Eliquis.  Discussed with her that there is no lab test to test whether or not the Eliquis is working.  As long as she takes it twice daily and does not miss any doses we know that it should be thinning her blood.

## 2020-02-14 NOTE — Assessment & Plan Note (Signed)
Does note some depression.  Singulair may be playing a role.  No SI.  We will stop the Singulair and see if this improves.  Continue Remeron.

## 2020-02-14 NOTE — Assessment & Plan Note (Signed)
It is okay for her to occasionally take Lomotil given benefit to her quality of life.  She will stop the biotin and will see if that makes a difference.

## 2020-02-14 NOTE — Assessment & Plan Note (Signed)
Discussed this is likely related to venous insufficiency.  Discussed elevating her legs and wearing compression hose which she has several pairs at home.  She will try to walk a little bit more as well.

## 2020-02-14 NOTE — Assessment & Plan Note (Signed)
Adequately controlled.  Check BMP.

## 2020-02-15 ENCOUNTER — Telehealth: Payer: Self-pay | Admitting: Family Medicine

## 2020-02-15 NOTE — Telephone Encounter (Signed)
Pt wanted a call back about what medication were discounted from her last appt

## 2020-02-15 NOTE — Telephone Encounter (Signed)
I called the patient and she wanted to know which pills she was to discontinue and per the visit note it was Singulair, biotin and trazodone.  Patient understood.  Lessie Manigo,cma

## 2020-02-19 ENCOUNTER — Telehealth: Payer: Self-pay | Admitting: Family Medicine

## 2020-02-19 NOTE — Telephone Encounter (Signed)
Pt called wanted her results from labs on Thursday.

## 2020-02-19 NOTE — Telephone Encounter (Signed)
I called the patient and inforemd her that her kidney function is stable and the other labs were good, she understood.  Gera Inboden,cma

## 2020-02-20 ENCOUNTER — Ambulatory Visit: Payer: Medicare Other | Admitting: Family Medicine

## 2020-02-25 ENCOUNTER — Other Ambulatory Visit: Payer: Self-pay | Admitting: Family Medicine

## 2020-03-19 DIAGNOSIS — M542 Cervicalgia: Secondary | ICD-10-CM | POA: Diagnosis not present

## 2020-03-19 DIAGNOSIS — I613 Nontraumatic intracerebral hemorrhage in brain stem: Secondary | ICD-10-CM | POA: Diagnosis not present

## 2020-03-19 DIAGNOSIS — G250 Essential tremor: Secondary | ICD-10-CM | POA: Diagnosis not present

## 2020-03-27 ENCOUNTER — Other Ambulatory Visit: Payer: Self-pay | Admitting: Family Medicine

## 2020-04-09 DIAGNOSIS — L308 Other specified dermatitis: Secondary | ICD-10-CM | POA: Diagnosis not present

## 2020-04-09 DIAGNOSIS — L821 Other seborrheic keratosis: Secondary | ICD-10-CM | POA: Diagnosis not present

## 2020-04-19 ENCOUNTER — Telehealth: Payer: Self-pay | Admitting: Family Medicine

## 2020-04-19 NOTE — Telephone Encounter (Signed)
Patient called in wanted to know if there is any reason she could not get a flu shot

## 2020-04-19 NOTE — Telephone Encounter (Signed)
Patient called in wanted to know if there is any reason she could not get a flu shot.  [name],cma

## 2020-04-20 NOTE — Telephone Encounter (Signed)
She can get the flu vaccine.

## 2020-04-23 NOTE — Telephone Encounter (Signed)
Lvm for the patient to call back about the labs tests she requested.  Pernell Dikes,cma

## 2020-04-23 NOTE — Telephone Encounter (Signed)
I called the patient and informed her that the provider stated it was okay for her to get a flu vaccine.  Patient also states she has black spots on her skin and she wants the provider to order labs at her facility to check on her liver and see what the coumadin is doing to her body.  She stated Aaron Edelman the nurse is leaving soon to come and work for [employer] and she wants  hi to draw her labs before he leaves.

## 2020-04-23 NOTE — Telephone Encounter (Signed)
She really needs to complete a visit to evaluate exactly what the black spots are.  She is no longer on Coumadin and is on Eliquis.  There is no lab test to check on Eliquis though we could check her liver function if she wants.  We have discussed this previously with her.  Please see if she is willing to do visit for this.

## 2020-04-24 NOTE — Telephone Encounter (Signed)
Please give verbal orders for a hepatic function panel.

## 2020-04-24 NOTE — Telephone Encounter (Signed)
Patient would like an order for the nurse to check her liver functions at the facility.  Briaunna Grindstaff,cma

## 2020-04-25 NOTE — Telephone Encounter (Signed)
I called and spoke with Madie Reno of the [facility] [place] and gave a verbal order for the hepatic function test for the patient and he understood and will get the labs.  Quana Chamberlain,cma

## 2020-05-01 DIAGNOSIS — Z23 Encounter for immunization: Secondary | ICD-10-CM | POA: Diagnosis not present

## 2020-05-03 ENCOUNTER — Telehealth: Payer: Self-pay | Admitting: *Deleted

## 2020-05-03 DIAGNOSIS — Z5181 Encounter for therapeutic drug level monitoring: Secondary | ICD-10-CM | POA: Diagnosis not present

## 2020-05-03 DIAGNOSIS — I10 Essential (primary) hypertension: Secondary | ICD-10-CM | POA: Diagnosis not present

## 2020-05-03 LAB — HEPATIC FUNCTION PANEL
ALT: 10 (ref 7–35)
AST: 19 (ref 13–35)
Alkaline Phosphatase: 47 (ref 25–125)
Bilirubin, Direct: 0.2 (ref 0.01–0.4)
Bilirubin, Total: 0.7

## 2020-05-03 LAB — COMPREHENSIVE METABOLIC PANEL: Albumin: 3.9 (ref 3.5–5.0)

## 2020-05-03 NOTE — Telephone Encounter (Signed)
[place] called for [employer] code for verbal hepatic function order.  Order printed signed and faxed to [phone]

## 2020-05-06 ENCOUNTER — Telehealth: Payer: Self-pay | Admitting: Family Medicine

## 2020-05-06 NOTE — Telephone Encounter (Signed)
Patient called in for results.

## 2020-05-06 NOTE — Telephone Encounter (Signed)
Patient is ready to schedule Prolia injection left message to call office, please schedule on or after 05/22/20.

## 2020-05-07 NOTE — Telephone Encounter (Signed)
Patient stated she is still having the diarrhea.  Can't do the imodium anymore it is not helping at all she wants to know if you can send something to the pharamcy that would be better than the imodium.  Kyrin Garn,cma

## 2020-05-07 NOTE — Telephone Encounter (Signed)
Lab results is in lab basket and has been abstracted. Patient wants results of these labs.  Patient also wants to know if she should continue taking the Singulair.  Evo Aderman,cma

## 2020-05-07 NOTE — Telephone Encounter (Signed)
Your liver function testing was acceptable.  Has she been having any allergy issues? Is she currently taking singulair? If she is not having any allergy issues and she does not need to take Singulair.

## 2020-05-07 NOTE — Telephone Encounter (Signed)
Prolia injection scheduled. °

## 2020-05-07 NOTE — Telephone Encounter (Signed)
LM for son to call back

## 2020-05-07 NOTE — Telephone Encounter (Signed)
Is she willing to go see GI for further help in managing this? At this point with the persistent diarrhea and lack of response to imodium she really should see them to get their advice on the next step in management.

## 2020-05-07 NOTE — Telephone Encounter (Signed)
Pt son called to get lab results and to know if she still needed to be taking the montelukast (SINGULAIR) tablet 10 mg

## 2020-05-08 NOTE — Telephone Encounter (Signed)
I called and spoke with the patient and informed  her that the provider suggested she see someone in GI about the Diarrhea and the patient stated she would think about it and let us know about the referral.  Mayerly Kaman,cma

## 2020-05-14 ENCOUNTER — Encounter: Payer: Self-pay | Admitting: Family Medicine

## 2020-05-14 ENCOUNTER — Ambulatory Visit (INDEPENDENT_AMBULATORY_CARE_PROVIDER_SITE_OTHER): Payer: Medicare Other | Admitting: Family Medicine

## 2020-05-14 ENCOUNTER — Other Ambulatory Visit: Payer: Self-pay

## 2020-05-14 DIAGNOSIS — I83893 Varicose veins of bilateral lower extremities with other complications: Secondary | ICD-10-CM | POA: Diagnosis not present

## 2020-05-14 DIAGNOSIS — R634 Abnormal weight loss: Secondary | ICD-10-CM | POA: Diagnosis not present

## 2020-05-14 DIAGNOSIS — K591 Functional diarrhea: Secondary | ICD-10-CM

## 2020-05-14 DIAGNOSIS — I10 Essential (primary) hypertension: Secondary | ICD-10-CM

## 2020-05-14 DIAGNOSIS — Z86711 Personal history of pulmonary embolism: Secondary | ICD-10-CM

## 2020-05-14 MED ORDER — CHOLESTYRAMINE 4 G PO PACK: 4.0000 g | PACK | Freq: Every day | ORAL | 1 refills | Status: DC

## 2020-05-14 NOTE — Assessment & Plan Note (Signed)
Ongoing issues with this.  She will monitor given lack of significant symptoms.

## 2020-05-14 NOTE — Assessment & Plan Note (Signed)
Chronic issue.  We will give her a trial of cholestyramine to see if that is beneficial.  I advised if this is not beneficial then she needs to see GI.

## 2020-05-14 NOTE — Assessment & Plan Note (Signed)
Adequate control for age.  Continue losartan 25 mg once daily and metoprolol 50 mg once daily.

## 2020-05-14 NOTE — Progress Notes (Signed)
[name] Rumps, MD Phone: [phone]  [name] is a 86 y.o. female who presents today for follow-up.  Diarrhea: This is an ongoing issue.  It is slightly better recently.  She watches what she eats though feels that that does not help.  The Lomotil helped minimally.  She wonders about other medicines to help with this.  No blood in her stool.  No abdominal pain.  History of PE: Taking Eliquis.  She does not miss any doses.  No bleeding issues.  No chest pain or shortness of breath.  Hypertension: Taking losartan and metoprolol.  No chest pain or shortness of breath.  Occasional lightheadedness only when she gets up in the morning out of bed.  Varicose veins: She is noted these more recently bilaterally.  They ache slightly if she has been standing for really long time though otherwise do not bother her.  Weight loss: Patient has had a steady decline in her weight over several years.  Prior work-up with CT abdomen, pelvis, and chest in 2019 was negative.  Up-to-date on cancer screening.  She reports she is not been eating nearly as much since she moved to her current facility where she has lived for a number of years now as the food is not good and is too expensive.  Social History   Tobacco Use  Smoking Status Former Smoker  . Types: Cigarettes  . Quit date: 05/13/1999  . Years since quitting: 21.0  Smokeless Tobacco Never Used     ROS see history of present illness  Objective  Physical Exam Vitals:   05/14/20 1214  BP: 140/80  Pulse: 96  Temp: 98.7 F (37.1 C)  SpO2: 96%    BP Readings from Last 3 Encounters:  05/14/20 140/80  02/14/20 130/80  11/14/19 140/80   Wt Readings from Last 3 Encounters:  05/14/20 113 lb 9.6 oz (51.5 kg)  02/14/20 115 lb 12.8 oz (52.5 kg)  11/14/19 117 lb (53.1 kg)    Physical Exam Constitutional:      General: She is not in acute distress.    Appearance: She is not diaphoretic.  Cardiovascular:     Rate and Rhythm: Normal  rate and regular rhythm.     Heart sounds: Normal heart sounds.  Pulmonary:     Effort: Pulmonary effort is normal.     Breath sounds: Normal breath sounds.  Musculoskeletal:     Right lower leg: No edema.     Left lower leg: No edema.     Comments: Varicose veins noted bilateral lower extremities with no edema  Skin:    General: Skin is warm and dry.  Neurological:     Mental Status: She is alert.      Assessment/Plan: Please see individual problem list.  Problem List Items Addressed This Visit    Diarrhea    Chronic issue.  We will give her a trial of cholestyramine to see if that is beneficial.  I advised if this is not beneficial then she needs to see GI.      Relevant Medications   cholestyramine (QUESTRAN) 4 g packet   History of pulmonary embolus (PE)    Continue Eliquis 2.5 mg twice daily.      Hypertension    Adequate control for age.  Continue losartan 25 mg once daily and metoprolol 50 mg once daily.      Relevant Medications   cholestyramine (QUESTRAN) 4 g packet   Varicose veins of both lower extremities  Ongoing issues with this.  She will monitor given lack of significant symptoms.      Relevant Medications   cholestyramine (QUESTRAN) 4 g packet   Weight loss    Appears to have had a steady decline in her weight.  She had a work-up with CT chest, abdomen, and pelvis in 2019 for her weight loss that was unremarkable for cause.  Thyroid function has been normal.  Breast cancer screening is up-to-date.  I suspect that her weight loss is related to her chronic issues with diarrhea and decreased calorie intake.  We will treat the diarrhea as above and if not improving consider follow-up with GI.          This visit occurred during the SARS-CoV-2 public health emergency.  Safety protocols were in place, including screening questions prior to the visit, additional usage of staff PPE, and extensive cleaning of exam room while observing appropriate contact  time as indicated for disinfecting solutions.    [name] Rumps, MD [place]

## 2020-05-14 NOTE — Assessment & Plan Note (Addendum)
Appears to have had a steady decline in her weight.  She had a work-up with CT chest, abdomen, and pelvis in 2019 for her weight loss that was unremarkable for cause.  Thyroid function has been normal.  Breast cancer screening is up-to-date.  I suspect that her weight loss is related to her chronic issues with diarrhea and decreased calorie intake.  We will treat the diarrhea as above and if not improving consider follow-up with GI.

## 2020-05-14 NOTE — Patient Instructions (Signed)
Nice to see you. We will see if cholestyramine is helpful for your diarrhea. I will have Gae Bon contact your pharmacy to see what medicines they have been putting in your pill pack.

## 2020-05-14 NOTE — Assessment & Plan Note (Signed)
Continue Eliquis 2.5 mg twice daily.  

## 2020-05-17 NOTE — Progress Notes (Signed)
I had a list of the medications that the pharmacy is packing in the patients pill pack, faxed to me and I put it in your review basket.  Margurite Duffy,cma

## 2020-05-23 ENCOUNTER — Ambulatory Visit (INDEPENDENT_AMBULATORY_CARE_PROVIDER_SITE_OTHER): Payer: Medicare Other

## 2020-05-23 ENCOUNTER — Other Ambulatory Visit: Payer: Self-pay

## 2020-05-23 DIAGNOSIS — M8580 Other specified disorders of bone density and structure, unspecified site: Secondary | ICD-10-CM

## 2020-05-23 MED ORDER — DENOSUMAB 60 MG/ML ~~LOC~~ SOSY
60.0000 mg | PREFILLED_SYRINGE | Freq: Once | SUBCUTANEOUS | Status: AC
  Administered 2020-05-23: 60 mg via SUBCUTANEOUS

## 2020-05-23 NOTE — Progress Notes (Signed)
Patient presented for 6-month Prolia injection SQ to right arm. Patient tolerated well. 

## 2020-05-27 ENCOUNTER — Telehealth: Payer: Self-pay

## 2020-05-27 ENCOUNTER — Other Ambulatory Visit: Payer: Self-pay

## 2020-05-27 DIAGNOSIS — K591 Functional diarrhea: Secondary | ICD-10-CM

## 2020-05-27 NOTE — Telephone Encounter (Signed)
I sent the provider the message after I spoke with the patient taht the patient is wanting the GI referral.  Jeury Mcnab,cma

## 2020-05-27 NOTE — Telephone Encounter (Signed)
Referral placed to GI 

## 2020-05-27 NOTE — Telephone Encounter (Signed)
Patient called in wanted to know what prevalite it suppose to do.

## 2020-06-05 DIAGNOSIS — Z961 Presence of intraocular lens: Secondary | ICD-10-CM | POA: Diagnosis not present

## 2020-06-11 ENCOUNTER — Ambulatory Visit (INDEPENDENT_AMBULATORY_CARE_PROVIDER_SITE_OTHER): Payer: Medicare Other

## 2020-06-11 ENCOUNTER — Encounter: Payer: Self-pay | Admitting: Family Medicine

## 2020-06-11 ENCOUNTER — Other Ambulatory Visit: Payer: Self-pay

## 2020-06-11 ENCOUNTER — Ambulatory Visit (INDEPENDENT_AMBULATORY_CARE_PROVIDER_SITE_OTHER): Payer: Medicare Other | Admitting: Family Medicine

## 2020-06-11 DIAGNOSIS — K591 Functional diarrhea: Secondary | ICD-10-CM

## 2020-06-11 DIAGNOSIS — Z9049 Acquired absence of other specified parts of digestive tract: Secondary | ICD-10-CM | POA: Diagnosis not present

## 2020-06-11 DIAGNOSIS — K529 Noninfective gastroenteritis and colitis, unspecified: Secondary | ICD-10-CM | POA: Diagnosis not present

## 2020-06-11 NOTE — Progress Notes (Signed)
  [name], MD Phone: [phone]  [name] is a 86 y.o. female who presents today for f/u.  Chronic diarrhea: This is an ongoing issue.  We tried cholestyramine with her last visit though it did not help.  She has been on Lomotil previously with little benefit.  Imodium was not beneficial.  She has tried dietary alterations with no benefit as well.  She has been referred to GI though notes they did not schedule her until February.  She wonders about getting an abdominal x-ray today.  She had a CT abdomen and pelvis in 2019 at the start of her issues with diarrhea and weight loss that did not reveal a specific cause for the symptoms.  Social History   Tobacco Use  Smoking Status Former Smoker  . Types: Cigarettes  . Quit date: 05/13/1999  . Years since quitting: 21.0  Smokeless Tobacco Never Used     ROS see history of present illness  Objective  Physical Exam Vitals:   06/11/20 1135  BP: 120/70  Pulse: 81  Temp: 97.9 F (36.6 C)  SpO2: 97%    BP Readings from Last 3 Encounters:  06/11/20 120/70  05/14/20 140/80  02/14/20 130/80   Wt Readings from Last 3 Encounters:  06/11/20 112 lb 12.8 oz (51.2 kg)  05/14/20 113 lb 9.6 oz (51.5 kg)  02/14/20 115 lb 12.8 oz (52.5 kg)    Physical Exam Abdominal:     General: Bowel sounds are normal. There is no distension.     Palpations: Abdomen is soft.     Tenderness: There is no abdominal tenderness. There is no guarding or rebound.      Assessment/Plan: Please see individual problem list.  Problem List Items Addressed This Visit    Diarrhea    Chronic issue.  Cholestyramine was not beneficial.  Other medications have not been beneficial.  She has been referred to GI though apparently they did not schedule her until February.  I will have somebody reach out to the GI office to see if there are any sooner appointments.  We will get an abdominal x-ray today at the patient's request.  This will help rule out  constipation with overflow diarrhea.      Relevant Orders   DG Abd 1 View       Health Maintenance: The patient wondered about the Shingrix vaccine.  I discussed given that she has Medicare she has to get this at the pharmacy.  I encouraged her to get this vaccine.   This visit occurred during the SARS-CoV-2 public health emergency.  Safety protocols were in place, including screening questions prior to the visit, additional usage of staff PPE, and extensive cleaning of exam room while observing appropriate contact time as indicated for disinfecting solutions.    [name], MD [place]

## 2020-06-11 NOTE — Progress Notes (Signed)
I called [name] and they put her on a list for cancellation and will call her when an opening comes available.  [name],cma

## 2020-06-11 NOTE — Assessment & Plan Note (Signed)
Chronic issue.  Cholestyramine was not beneficial.  Other medications have not been beneficial.  She has been referred to GI though apparently they did not schedule her until February.  I will have somebody reach out to the GI office to see if there are any sooner appointments.  We will get an abdominal x-ray today at the patient's request.  This will help rule out constipation with overflow diarrhea.

## 2020-06-11 NOTE — Patient Instructions (Signed)
Nice to see you. We will reach out to GI to see if they have a sooner appointment. We will get an x-ray today.

## 2020-06-26 ENCOUNTER — Other Ambulatory Visit: Payer: Self-pay | Admitting: General Surgery

## 2020-06-26 ENCOUNTER — Other Ambulatory Visit: Payer: Self-pay | Admitting: Family Medicine

## 2020-06-26 DIAGNOSIS — Z1231 Encounter for screening mammogram for malignant neoplasm of breast: Secondary | ICD-10-CM

## 2020-07-02 ENCOUNTER — Other Ambulatory Visit: Payer: Self-pay | Admitting: Family Medicine

## 2020-07-02 DIAGNOSIS — R634 Abnormal weight loss: Secondary | ICD-10-CM | POA: Diagnosis not present

## 2020-07-02 DIAGNOSIS — K591 Functional diarrhea: Secondary | ICD-10-CM | POA: Diagnosis not present

## 2020-07-12 ENCOUNTER — Other Ambulatory Visit: Payer: Self-pay | Admitting: Family Medicine

## 2020-08-13 ENCOUNTER — Ambulatory Visit
Admission: RE | Admit: 2020-08-13 | Discharge: 2020-08-13 | Disposition: A | Discharge status: Home / Self Care | Payer: Medicare Other | Source: Ambulatory Visit | Attending: Family Medicine | Admitting: Family Medicine

## 2020-08-13 ENCOUNTER — Other Ambulatory Visit: Payer: Self-pay

## 2020-08-13 DIAGNOSIS — Z1231 Encounter for screening mammogram for malignant neoplasm of breast: Secondary | ICD-10-CM | POA: Insufficient documentation

## 2020-08-20 DIAGNOSIS — Z853 Personal history of malignant neoplasm of breast: Secondary | ICD-10-CM | POA: Diagnosis not present

## 2020-08-23 ENCOUNTER — Telehealth: Payer: Self-pay | Admitting: Family Medicine

## 2020-08-23 NOTE — Telephone Encounter (Signed)
Patient 's son called in about appointment for mother  it was a referral that was moved up to 09-17-20 at 11:30 he received a call and it is on the same day as her  appointment with Dr.Sonnenberg at 11:00

## 2020-08-23 NOTE — Telephone Encounter (Signed)
Ok the appt was with [facility] gastro. I spoke with son. Thank you!

## 2020-09-10 ENCOUNTER — Other Ambulatory Visit: Payer: Self-pay | Admitting: Family Medicine

## 2020-09-13 ENCOUNTER — Other Ambulatory Visit: Payer: Self-pay

## 2020-09-16 DIAGNOSIS — Z20828 Contact with and (suspected) exposure to other viral communicable diseases: Secondary | ICD-10-CM | POA: Diagnosis not present

## 2020-09-17 ENCOUNTER — Ambulatory Visit: Payer: Medicare Other | Admitting: Family Medicine

## 2020-09-30 ENCOUNTER — Other Ambulatory Visit: Payer: Self-pay | Admitting: Family Medicine

## 2020-09-30 NOTE — Progress Notes (Signed)
Cardiology Office Note  Date:  10/01/2020   ID:  [name], DOB March 18, 1926, MRN [id number]  PCP:  [name] Haven, MD   Chief Complaint  Patient presents with  . 12 month follow up     "doing well." Medications reviewed by the patient's son [name]). [name] (son) who is the POA would like to know why the patient needs to continue her care here.     HPI:  87 year old female with a history of  hypertension,  PE status post IVC filter on chronic Coumadin,  breast cancer,  depression,  hospitalization related to orthostatic lightheadedness and tachypalpitations with finding of atrial tachycardia Who presents for atrial tachycardia  In follow-up today she presents with her son Lives at [place], Son has help to coordinate her medications but reports [name] does not take her medications in the consistent fashion Often has 8 pill packs or so left over at the end of the month that she has not taken  She reports that she feels well, denies any orthostasis or dizziness, no tachycardia or palpitations Legs weak, uses a walker all the time, no falls Only complaint is rare sharp pain right neck Son reports that she falls asleep with her head down  Takes melatonin at night  EKG personally reviewed by myself on todays visit Shows normal sinus rhythm rate 75 bpm unable to exclude old inferior MI  Other past medical history reviewed August 2020, she  noted intermittent tachypalpitations, orthostasis.  emergency department on March 31 2019  runs of atrial tachycardia with rates up into the 130s.   Head CT was negative for any acute changes.    recommendation for discontinuation of amlodipine and HCTZ and addition of metoprolol 50 mg twice daily.   Echocardiogram showed normal LV function with impaired relaxation.     At primary care follow-up, she was hypertensive and amlodipine was added back however she never resumed it due to concern for palpitations.  Instead, losartan was  added   Of note, she is also follow-up with hematology with recommendation for vascular follow-up for IVC removal.  It is felt that she would be able to come off of anticoagulation if IVC could be removed.  She has been reluctant to have it removed in the past as she says she is okay with taking Coumadin and would just prefer to avoid another procedure.    PMH:   has a past medical history of A-fib ([place]), Allergy, Breast cancer ([place]) (07/22/2012), Hypertension, Insomnia, Osteoporosis, PAT (paroxysmal atrial tachycardia) ([place]), Pulmonary embolus ([place]) (2009), and S/P IVC filter (2009).  PSH:    Past Surgical History:  Procedure Laterality Date  . BLADDER SURGERY    . BREAST SURGERY Left 2013   mastectomy  . CHOLECYSTECTOMY    . HERNIA REPAIR    . MASTECTOMY Left 2013   BREAST CA  . REPLACEMENT TOTAL KNEE     right  . TONSILLECTOMY      Current Outpatient Medications  Medication Sig Dispense Refill  . albuterol (PROVENTIL HFA;VENTOLIN HFA) 108 (90 Base) MCG/ACT inhaler Inhale 2 puffs into the lungs every 6 (six) hours as needed for wheezing or shortness of breath. 1 Inhaler 1  . Cholecalciferol (VITAMIN D PO) Take 2,000 mg by mouth daily.    . cholestyramine light (PREVALITE) 4 g packet     . denosumab (PROLIA) 60 MG/ML SOSY injection Prolia 60 mg/mL subcutaneous syringe  Inject 1 mL by subcutaneous route.    . diphenoxylate-atropine (LOMOTIL) 2.5-0.025  MG tablet Take 1 tablet by mouth 4 (four) times daily as needed for diarrhea or loose stools. 20 tablet 0  . ELIQUIS 2.5 MG TABS tablet TAKE 1 TABLET TWICE A DAY (DISCONTINUE WARFARIN) 180 tablet 3  . losartan (COZAAR) 25 MG tablet TAKE 1 TABLET DAILY 90 tablet 3  . metoprolol tartrate (LOPRESSOR) 50 MG tablet TAKE 1 TABLET TWICE A DAY 180 tablet 3  . triamcinolone cream (KENALOG) 0.1 % Apply 1 application topically 2 (two) times daily. For up to 7 days. Do not apply to the face. 30 g 0  . XIIDRA 5 % SOLN Place 1-2 drops into both eyes  daily.      No current facility-administered medications for this visit.     Allergies:   Erythromycin and Sulfa drugs cross reactors   Social History:  The patient  reports that she quit smoking about 21 years ago. Her smoking use included cigarettes. She has never used smokeless tobacco. She reports that she does not drink alcohol and does not use drugs.   Family History:   family history includes Breast cancer in her maternal aunt; COPD in her mother; Stroke in her father.    Review of Systems: Review of Systems  Constitutional: Negative.   HENT: Negative.   Respiratory: Negative.   Cardiovascular: Negative.   Gastrointestinal: Negative.   Musculoskeletal: Negative.        Neck pain  Neurological: Negative.   Psychiatric/Behavioral: Negative.   All other systems reviewed and are negative.   PHYSICAL EXAM: VS:  BP 126/66 (BP Location: Left Arm, Patient Position: Sitting, Cuff Size: Normal)   Pulse 75   Ht 5' (1.524 m)   Wt 109 lb (49.4 kg)   SpO2 97%   BMI 21.29 kg/m  , BMI Body mass index is 21.29 kg/m. Constitutional:  oriented to person, place, and time. No distress.  HENT:  Head: Grossly normal Eyes:  no discharge. No scleral icterus.  Neck: No JVD, no carotid bruits  Cardiovascular: Regular rate and rhythm, no murmurs appreciated Pulmonary/Chest: Clear to auscultation bilaterally, no wheezes or rails Abdominal: Soft.  no distension.  no tenderness.  Musculoskeletal: Normal range of motion Neurological:  normal muscle tone. Coordination normal. No atrophy Skin: Skin warm and dry Psychiatric: normal affect, pleasant   Recent Labs: 02/14/2020: BUN 12; Creatinine, Ser 0.96; Potassium 4.4; Sodium 140 05/03/2020: ALT 10    Lipid Panel Lab Results  Component Value Date   CHOL 170 06/02/2019   HDL 63 06/02/2019   LDLCALC 89 06/02/2019   TRIG 90 06/02/2019      Wt Readings from Last 3 Encounters:  10/01/20 109 lb (49.4 kg)  06/11/20 112 lb 12.8 oz (51.2  kg)  05/14/20 113 lb 9.6 oz (51.5 kg)     ASSESSMENT AND PLAN:  Problem List Items Addressed This Visit      Cardiology Problems   Hypertension   Relevant Medications   cholestyramine light (PREVALITE) 4 g packet   Atrial tachycardia (HCC) - Primary   Relevant Medications   cholestyramine light (PREVALITE) 4 g packet   Other Relevant Orders   EKG 12-Lead   TIA (transient ischemic attack)   Relevant Medications   cholestyramine light (PREVALITE) 4 g packet   Postural dizziness with near syncope   Relevant Medications   cholestyramine light (PREVALITE) 4 g packet     Other   History of pulmonary embolus (PE)    Other Visit Diagnoses    PAT (paroxysmal atrial tachycardia) (  [place])       Relevant Medications   cholestyramine light (PREVALITE) 4 g packet   Essential hypertension       Relevant Medications   cholestyramine light (PREVALITE) 4 g packet   Other Relevant Orders   EKG 12-Lead     Atrial tachycardia Denies any tachypalpitations, Continue metoprolol twice daily, unclear if she is taking this on a regular basis but currently with minimal symptoms She declined changing to metoprolol succinate Son does not want to change her medications, reports that he "got them just right"  History of PE On Eliquis, denies falls Some medication compliance issues  Essential hypertension No orthostasis or dizziness Blood pressure is well controlled on today's visit. No changes made to the medications.  Patient reports that she would like to be seen once a year, son wondering why she needs to come back Will defer to Dr. [name] and the patient In general she is stable and can be watched from a distance   Total encounter time more than 25 minutes  Greater than 50% was spent in counseling and coordination of care with the patient    Signed, Esmond Plants, M.D., Ph.D. [place], [place]

## 2020-10-01 ENCOUNTER — Encounter: Payer: Self-pay | Admitting: Cardiovascular Disease

## 2020-10-01 ENCOUNTER — Ambulatory Visit (INDEPENDENT_AMBULATORY_CARE_PROVIDER_SITE_OTHER): Payer: Medicare Other | Admitting: Cardiovascular Disease

## 2020-10-01 ENCOUNTER — Other Ambulatory Visit: Payer: Self-pay

## 2020-10-01 VITALS — BP 126/66 | HR 75 | Ht 60.0 in | Wt 109.0 lb

## 2020-10-01 DIAGNOSIS — R42 Dizziness and giddiness: Secondary | ICD-10-CM | POA: Diagnosis not present

## 2020-10-01 DIAGNOSIS — Z86711 Personal history of pulmonary embolism: Secondary | ICD-10-CM | POA: Diagnosis not present

## 2020-10-01 DIAGNOSIS — I471 Supraventricular tachycardia: Secondary | ICD-10-CM

## 2020-10-01 DIAGNOSIS — I10 Essential (primary) hypertension: Secondary | ICD-10-CM | POA: Diagnosis not present

## 2020-10-01 DIAGNOSIS — R55 Syncope and collapse: Secondary | ICD-10-CM

## 2020-10-01 DIAGNOSIS — I4719 Other supraventricular tachycardia: Secondary | ICD-10-CM

## 2020-10-01 DIAGNOSIS — G459 Transient cerebral ischemic attack, unspecified: Secondary | ICD-10-CM

## 2020-10-01 NOTE — Patient Instructions (Signed)
Medication Instructions:  No changes  If you need a refill on your cardiac medications before your next appointment, please call your pharmacy.    Lab work: No new labs needed   If you have labs (blood work) drawn today and your tests are completely normal, you will receive your results only by: . MyChart Message (if you have MyChart) OR . A paper copy in the mail If you have any lab test that is abnormal or we need to change your treatment, we will call you to review the results.   Testing/Procedures: No new testing needed   Follow-Up: At [facility], you and your health needs are our priority.  As part of our continuing mission to provide you with exceptional heart care, we have created designated Provider Care Teams.  These Care Teams include your primary Cardiologist (physician) and Advanced Practice Providers (APPs -  Physician Assistants and Nurse Practitioners) who all work together to provide you with the care you need, when you need it.  . You will need a follow up appointment as needed  . Providers on your designated Care Team:   . Christopher Berge, NP . Ryan Dunn, PA-C . Jacquelyn Visser, PA-C  Any Other Special Instructions Will Be Listed Below (If Applicable).  COVID-19 Vaccine Information can be found at: [email or web address] For questions related to vaccine distribution or appointments, please email [email or web address] or call [phone].     

## 2020-10-03 DIAGNOSIS — K591 Functional diarrhea: Secondary | ICD-10-CM | POA: Diagnosis not present

## 2020-10-03 DIAGNOSIS — R634 Abnormal weight loss: Secondary | ICD-10-CM | POA: Diagnosis not present

## 2020-10-24 ENCOUNTER — Telehealth: Payer: Self-pay | Admitting: Family Medicine

## 2020-10-24 NOTE — Telephone Encounter (Signed)
Called to schedule Prolia injection left message to call office, please schedule on or after 11/14/20.

## 2020-10-29 ENCOUNTER — Ambulatory Visit (INDEPENDENT_AMBULATORY_CARE_PROVIDER_SITE_OTHER): Payer: Medicare Other | Admitting: Family Medicine

## 2020-10-29 ENCOUNTER — Encounter: Payer: Self-pay | Admitting: Family Medicine

## 2020-10-29 ENCOUNTER — Other Ambulatory Visit: Payer: Self-pay

## 2020-10-29 VITALS — BP 118/70 | HR 83 | Temp 98.0°F | Ht 60.0 in | Wt 110.0 lb

## 2020-10-29 DIAGNOSIS — I10 Essential (primary) hypertension: Secondary | ICD-10-CM | POA: Diagnosis not present

## 2020-10-29 DIAGNOSIS — Z86711 Personal history of pulmonary embolism: Secondary | ICD-10-CM | POA: Diagnosis not present

## 2020-10-29 DIAGNOSIS — R634 Abnormal weight loss: Secondary | ICD-10-CM

## 2020-10-29 DIAGNOSIS — K591 Functional diarrhea: Secondary | ICD-10-CM

## 2020-10-29 DIAGNOSIS — E041 Nontoxic single thyroid nodule: Secondary | ICD-10-CM | POA: Diagnosis not present

## 2020-10-29 LAB — CBC WITH DIFFERENTIAL/PLATELET
Basophils Absolute: 0 10*3/uL (ref 0.0–0.1)
Basophils Relative: 0.4 % (ref 0.0–3.0)
Eosinophils Absolute: 0.1 10*3/uL (ref 0.0–0.7)
Eosinophils Relative: 0.6 % (ref 0.0–5.0)
HCT: 38 % (ref 36.0–46.0)
Hemoglobin: 12.8 g/dL (ref 12.0–15.0)
Lymphocytes Relative: 11.4 % — ABNORMAL LOW (ref 12.0–46.0)
Lymphs Abs: 1.1 10*3/uL (ref 0.7–4.0)
MCHC: 33.8 g/dL (ref 30.0–36.0)
MCV: 87.7 fl (ref 78.0–100.0)
Monocytes Absolute: 0.5 10*3/uL (ref 0.1–1.0)
Monocytes Relative: 5 % (ref 3.0–12.0)
Neutro Abs: 7.6 10*3/uL (ref 1.4–7.7)
Neutrophils Relative %: 82.6 % — ABNORMAL HIGH (ref 43.0–77.0)
Platelets: 237 10*3/uL (ref 150.0–400.0)
RBC: 4.33 Mil/uL (ref 3.87–5.11)
RDW: 13.9 % (ref 11.5–15.5)
WBC: 9.2 10*3/uL (ref 4.0–10.5)

## 2020-10-29 LAB — COMPREHENSIVE METABOLIC PANEL
ALT: 9 U/L (ref 0–35)
AST: 14 U/L (ref 0–37)
Albumin: 3.9 g/dL (ref 3.5–5.2)
Alkaline Phosphatase: 36 U/L — ABNORMAL LOW (ref 39–117)
BUN: 16 mg/dL (ref 6–23)
CO2: 29 mEq/L (ref 19–32)
Calcium: 9.6 mg/dL (ref 8.4–10.5)
Chloride: 104 mEq/L (ref 96–112)
Creatinine, Ser: 0.96 mg/dL (ref 0.40–1.20)
GFR: 51.17 mL/min — ABNORMAL LOW (ref >60.00)
Glucose, Bld: 91 mg/dL (ref 70–99)
Potassium: 4.1 mEq/L (ref 3.5–5.1)
Sodium: 140 mEq/L (ref 135–145)
Total Bilirubin: 0.6 mg/dL (ref 0.2–1.2)
Total Protein: 6.5 g/dL (ref 6.0–8.3)

## 2020-10-29 LAB — TSH: TSH: 2.55 u[IU]/mL (ref 0.35–4.50)

## 2020-10-29 LAB — SEDIMENTATION RATE: Sed Rate: 6 mm/hr (ref 0–30)

## 2020-10-29 NOTE — Assessment & Plan Note (Signed)
Adequately controlled.  We will continue losartan 25 mg daily and Lopressor 50 mg twice daily.  Check labs.

## 2020-10-29 NOTE — Progress Notes (Signed)
[name], MD Phone: [phone]  [name] is a 87 y.o. female who presents today for f/u.  HYPERTENSION  Disease Monitoring  Home BP Monitoring typically 103-159 systolically Chest pain- no    Dyspnea- no Medications  Compliance-  Taking losartan, metoprolol.  Edema- mild L>R chronic  History of pulmonary embolus: Taking Eliquis twice daily.  No chest pain, shortness breath, or changes to her chronic edema.  No missed doses.  No bleeding issues.  Chronic diarrhea: She saw GI.  They felt her symptoms were related to her food intake.  She does note certain foods at her facility seem to irritate her bowels more than others.  There are lots of salads and lots of meats that are labeled differently though appear to be the same kind of meat.  There are lots of spices used.  She has been using Imodium when she does get diarrhea notes its been helpful.  Last diarrhea was a week ago.  Weight loss: Weight has been relatively stable over the last 6 months though she did have a significant downtrend over several years.  She had a prior work-up that did not reveal a significant cause for her weight loss and this was felt to be related to decreased food intake related to different food options at her facility than she is used to and to her diarrhea.  Mammogram is up-to-date.  The patient has aged out of colon cancer screening and cervical cancer screening.    Social History   Tobacco Use  Smoking Status Former Smoker  . Types: Cigarettes  . Quit date: 05/13/1999  . Years since quitting: 21.4  Smokeless Tobacco Never Used    Current Outpatient Medications on File Prior to Visit  Medication Sig Dispense Refill  . albuterol (PROVENTIL HFA;VENTOLIN HFA) 108 (90 Base) MCG/ACT inhaler Inhale 2 puffs into the lungs every 6 (six) hours as needed for wheezing or shortness of breath. 1 Inhaler 1  . ALPRAZolam (XANAX) 0.5 MG tablet Take 0.5 mg by mouth at bedtime as needed for anxiety.    Marland Kitchen  apixaban (ELIQUIS) 2.5 MG TABS tablet Take 2.5 mg by mouth 2 (two) times daily.    . Cholecalciferol (VITAMIN D PO) Take 2,000 mg by mouth daily.    . cholestyramine light (PREVALITE) 4 g packet     . denosumab (PROLIA) 60 MG/ML SOSY injection Prolia 60 mg/mL subcutaneous syringe  Inject 1 mL by subcutaneous route.    . diphenoxylate-atropine (LOMOTIL) 2.5-0.025 MG tablet Take 1 tablet by mouth 4 (four) times daily as needed for diarrhea or loose stools. 20 tablet 0  . fluticasone (FLONASE) 50 MCG/ACT nasal spray Place 1 spray into both nostrils daily.    Marland Kitchen loperamide (IMODIUM) 2 MG capsule Take 2 mg by mouth as needed for diarrhea or loose stools.    Marland Kitchen losartan (COZAAR) 25 MG tablet TAKE 1 TABLET DAILY 90 tablet 3  . metoprolol tartrate (LOPRESSOR) 50 MG tablet TAKE 1 TABLET TWICE A DAY 180 tablet 3  . montelukast (SINGULAIR) 10 MG tablet     . triamcinolone cream (KENALOG) 0.1 % Apply 1 application topically 2 (two) times daily. For up to 7 days. Do not apply to the face. 30 g 0  . XIIDRA 5 % SOLN Place 1-2 drops into both eyes daily.     . baclofen (LIORESAL) 10 MG tablet Take 10 mg by mouth 3 (three) times daily. (Patient not taking: Reported on 10/29/2020)     No current facility-administered medications  on file prior to visit.     ROS see history of present illness  Objective  Physical Exam Vitals:   10/29/20 1109  BP: 118/70  Pulse: 83  Temp: 98 F (36.7 C)  SpO2: 97%    BP Readings from Last 3 Encounters:  10/29/20 118/70  10/01/20 126/66  06/11/20 120/70   Wt Readings from Last 3 Encounters:  10/29/20 110 lb (49.9 kg)  10/01/20 109 lb (49.4 kg)  06/11/20 112 lb 12.8 oz (51.2 kg)    Physical Exam Constitutional:      General: She is not in acute distress.    Appearance: She is not diaphoretic.  HENT:     Head: Normocephalic and atraumatic.  Neck:     Comments: Possible small thyroid nodule right thyroid lobe Cardiovascular:     Rate and Rhythm: Normal rate  and regular rhythm.     Heart sounds: Normal heart sounds.  Pulmonary:     Effort: Pulmonary effort is normal.     Breath sounds: Normal breath sounds.  Chest:  Breasts:     Right: No axillary adenopathy.     Left: No axillary adenopathy.    Abdominal:     General: Bowel sounds are normal. There is no distension.     Palpations: Abdomen is soft.     Tenderness: There is no abdominal tenderness.  Musculoskeletal:     Right lower leg: No edema.     Left lower leg: No edema.  Lymphadenopathy:     Cervical: No cervical adenopathy.     Upper Body:     Right upper body: No axillary adenopathy.     Left upper body: No axillary adenopathy.  Skin:    General: Skin is warm and dry.  Neurological:     Mental Status: She is alert.      Assessment/Plan: Please see individual problem list.  Problem List Items Addressed This Visit    Diarrhea    Chronic issue.  Likely food related.  She can continue as needed Imodium.      History of pulmonary embolus (PE)    Asymptomatic.  She will continue Eliquis 2.5 mg twice daily.  She will monitor for bleeding issues.      Relevant Orders   CBC w/Diff   Hypertension    Adequately controlled.  We will continue losartan 25 mg daily and Lopressor 50 mg twice daily.  Check labs.      Relevant Medications   apixaban (ELIQUIS) 2.5 MG TABS tablet   Other Relevant Orders   Comp Met (CMET)   Weight loss    Her weight appears to be relatively stable over the last 6 months.  Potential thyroid nodule noted on exam today.  It has been several years since we have checked lab work for this and thus we will repeat some labs to evaluate for potential cause of weight loss.  Breast cancer screening is up-to-date.  I do suspect her weight loss is most likely related to decreased food intake given different food options than she is used to at her facility.  Her diarrhea is also likely playing a role and she has seen GI for that.      Relevant Orders   TSH    Comp Met (CMET)   CBC w/Diff   Sedimentation rate    Other Visit Diagnoses    Thyroid nodule    -  Primary   Relevant Orders   US THYROID  Health Maintenance: The patient reports having received 3 Covid vaccines.  I encouraged her to get a fourth vaccine once she is at least 4 months after her third vaccine.  I also encouraged her to get the Shingrix vaccine at the pharmacy.     This visit occurred during the SARS-CoV-2 public health emergency.  Safety protocols were in place, including screening questions prior to the visit, additional usage of staff PPE, and extensive cleaning of exam room while observing appropriate contact time as indicated for disinfecting solutions.    [name], MD [place]

## 2020-10-29 NOTE — Assessment & Plan Note (Signed)
Asymptomatic.  She will continue Eliquis 2.5 mg twice daily.  She will monitor for bleeding issues.

## 2020-10-29 NOTE — Assessment & Plan Note (Signed)
Chronic issue.  Likely food related.  She can continue as needed Imodium.

## 2020-10-29 NOTE — Patient Instructions (Signed)
Nice to see you. You can continue the Imodium as needed for your diarrhea. Please try to avoid foods that trigger your diarrhea. We will get an ultrasound of your thyroid to evaluate the potential nodule I felt today. We will get lab work and let you know what the results are.

## 2020-10-29 NOTE — Assessment & Plan Note (Signed)
Her weight appears to be relatively stable over the last 6 months.  Potential thyroid nodule noted on exam today.  It has been several years since we have checked lab work for this and thus we will repeat some labs to evaluate for potential cause of weight loss.  Breast cancer screening is up-to-date.  I do suspect her weight loss is most likely related to decreased food intake given different food options than she is used to at her facility.  Her diarrhea is also likely playing a role and she has seen GI for that.

## 2020-11-07 IMAGING — MG DIGITAL SCREENING UNILATERAL RIGHT MAMMOGRAM WITH CAD AND TOMO
4 series · 4 of 12 positions shown · non-contrast
Comparison: Previous exam(s).

CLINICAL DATA: Screening.

EXAM:
DIGITAL SCREENING UNILATERAL RIGHT MAMMOGRAM WITH CAD AND TOMO

[R CC synth-2D]
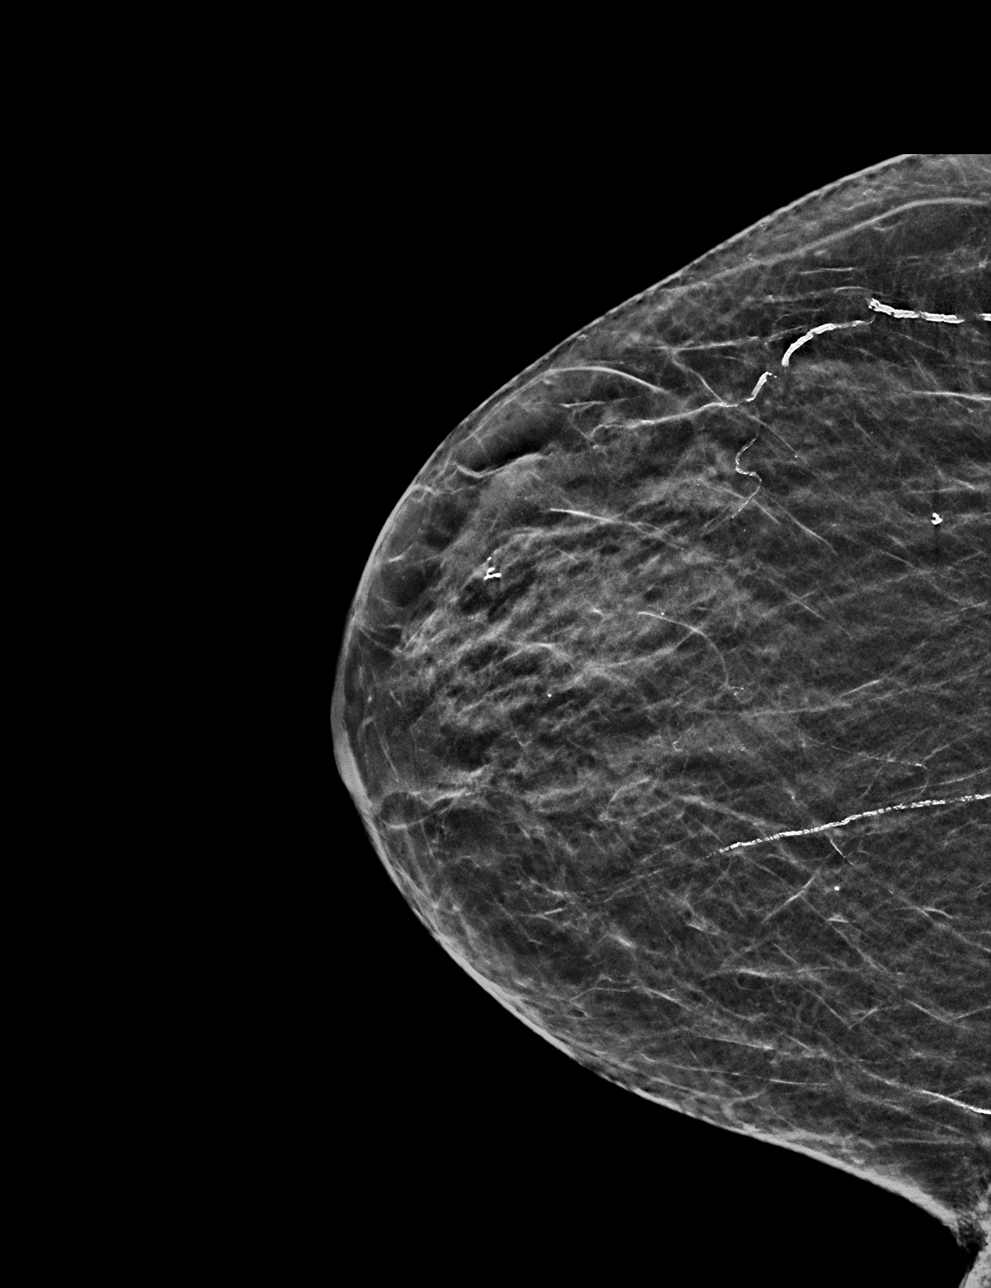

[R MLO synth-2D]
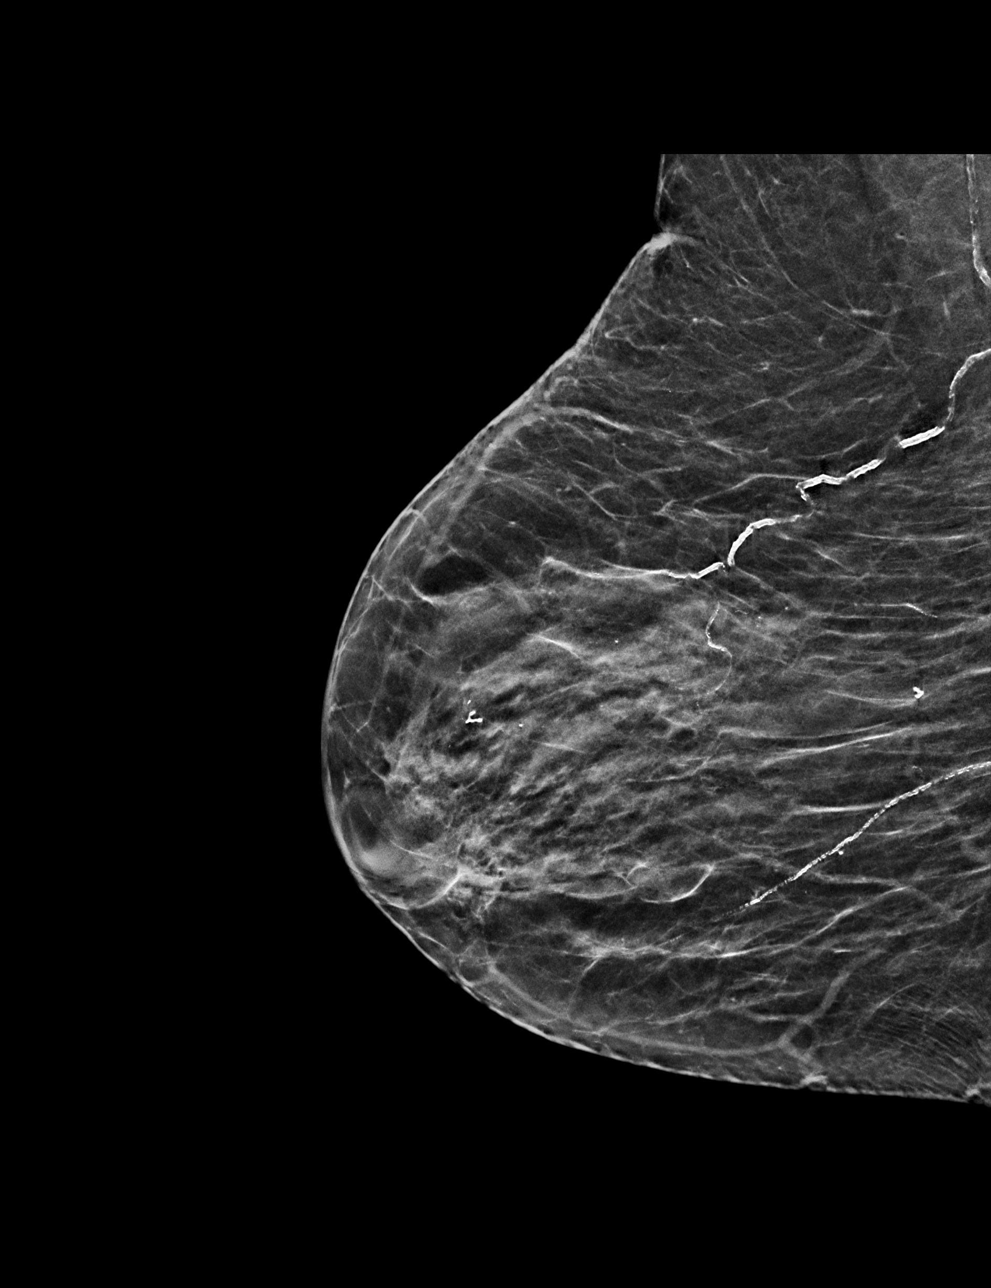

[R CC tomo · tomo slice 21/40.0]
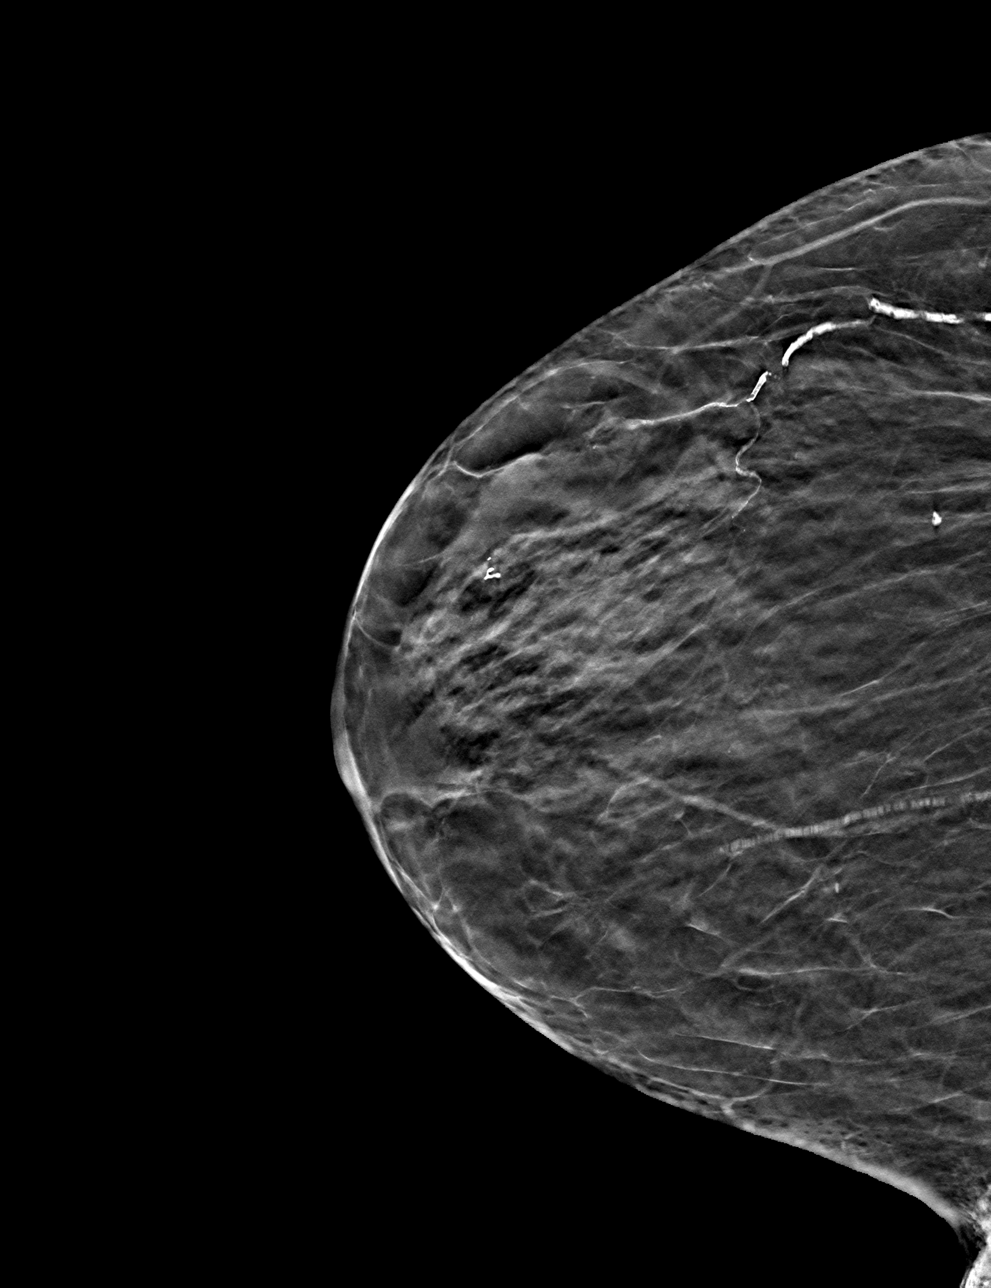

[R MLO tomo · tomo slice 21/42.0]
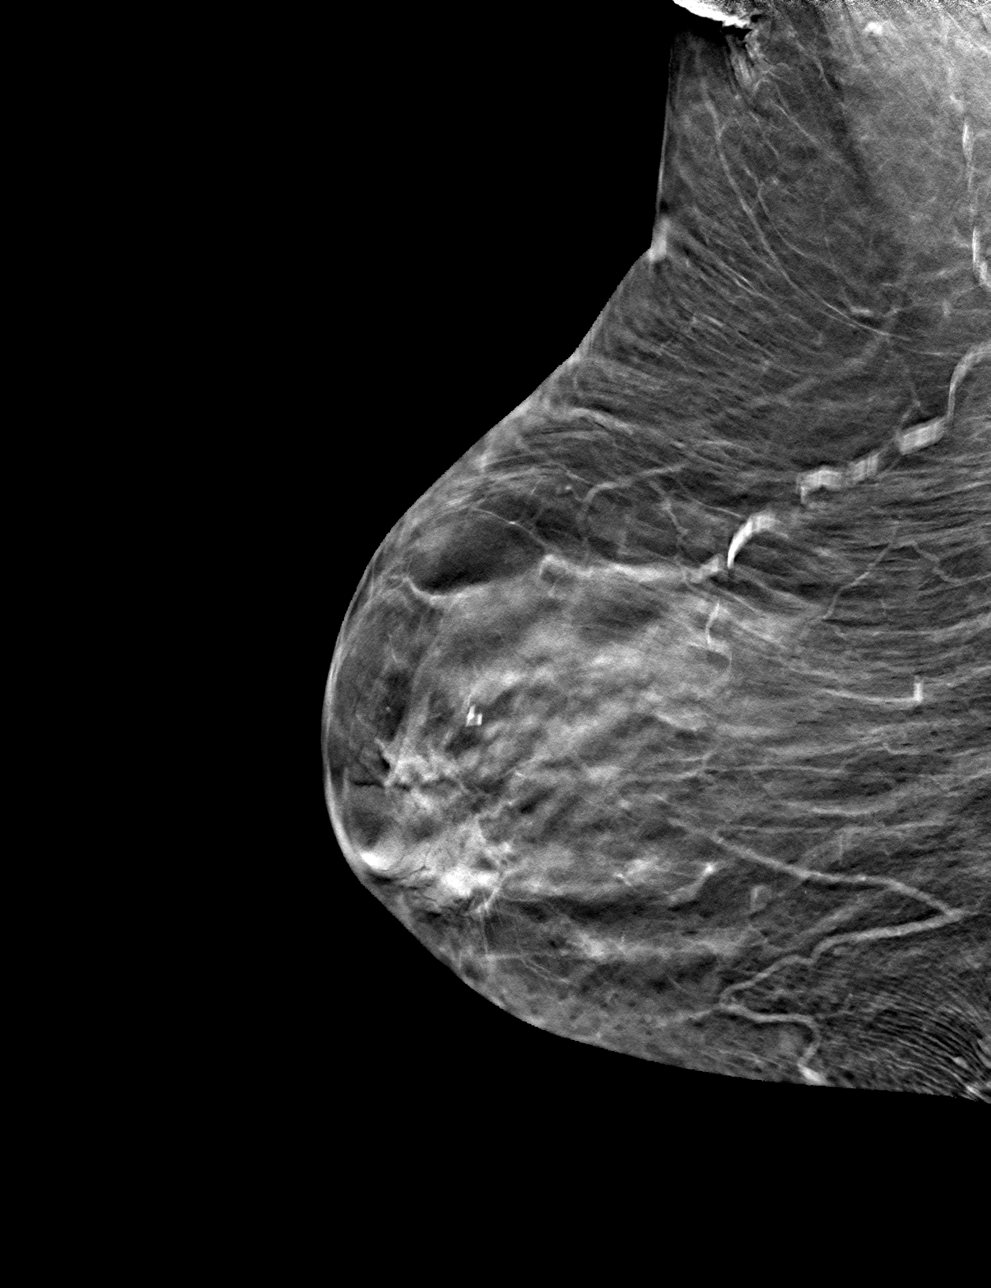

[4 of 12 positions shown; findings below may reference images not displayed]

ACR Breast Density Category b: There are scattered areas of
fibroglandular density.
FINDINGS: The patient has had a left mastectomy. There are no findings
suspicious for malignancy. Images were processed with CAD.
IMPRESSION: No mammographic evidence of malignancy. A result letter of this
screening mammogram will be mailed directly to the patient.

RECOMMENDATION:
Screening mammogram in one year.  (Code:H3-9-9K3)

BI-RADS CATEGORY  1: Negative.

## 2020-11-14 ENCOUNTER — Other Ambulatory Visit: Payer: Self-pay

## 2020-11-14 ENCOUNTER — Ambulatory Visit (INDEPENDENT_AMBULATORY_CARE_PROVIDER_SITE_OTHER): Payer: Medicare Other

## 2020-11-14 DIAGNOSIS — M8580 Other specified disorders of bone density and structure, unspecified site: Secondary | ICD-10-CM

## 2020-11-14 MED ORDER — DENOSUMAB 60 MG/ML ~~LOC~~ SOSY
60.0000 mg | PREFILLED_SYRINGE | Freq: Once | SUBCUTANEOUS | Status: AC
  Administered 2020-11-14: 60 mg via SUBCUTANEOUS

## 2020-11-14 NOTE — Progress Notes (Signed)
Patient presented for 6-month Prolia injection SQ to left arm. Patient tolerated well. 

## 2020-12-05 ENCOUNTER — Other Ambulatory Visit: Payer: Self-pay

## 2020-12-05 ENCOUNTER — Ambulatory Visit
Admission: RE | Admit: 2020-12-05 | Discharge: 2020-12-05 | Disposition: A | Discharge status: Home / Self Care | Payer: Medicare Other | Source: Ambulatory Visit | Attending: Family Medicine | Admitting: Family Medicine

## 2020-12-05 DIAGNOSIS — E041 Nontoxic single thyroid nodule: Secondary | ICD-10-CM | POA: Insufficient documentation

## 2020-12-18 DIAGNOSIS — L853 Xerosis cutis: Secondary | ICD-10-CM | POA: Diagnosis not present

## 2020-12-18 DIAGNOSIS — L821 Other seborrheic keratosis: Secondary | ICD-10-CM | POA: Diagnosis not present

## 2020-12-18 DIAGNOSIS — L570 Actinic keratosis: Secondary | ICD-10-CM | POA: Diagnosis not present

## 2020-12-18 DIAGNOSIS — L814 Other melanin hyperpigmentation: Secondary | ICD-10-CM | POA: Diagnosis not present

## 2020-12-26 ENCOUNTER — Telehealth: Payer: Self-pay

## 2020-12-26 NOTE — Telephone Encounter (Signed)
Pt's son called and asked if he can come pick up a urine cup for his mother to bring in Tuesday for her appt? Please advise

## 2020-12-26 NOTE — Telephone Encounter (Signed)
I called the patients son and informed him that his mother can do the urine test the day of the visit, because we can run the urine test in office and he understood. Leeon Makar,cma

## 2020-12-31 ENCOUNTER — Other Ambulatory Visit: Payer: Self-pay

## 2020-12-31 ENCOUNTER — Encounter: Payer: Self-pay | Admitting: Family Medicine

## 2020-12-31 ENCOUNTER — Ambulatory Visit (INDEPENDENT_AMBULATORY_CARE_PROVIDER_SITE_OTHER): Payer: Medicare Other | Admitting: Family Medicine

## 2020-12-31 DIAGNOSIS — R350 Frequency of micturition: Secondary | ICD-10-CM | POA: Diagnosis not present

## 2020-12-31 DIAGNOSIS — Z86711 Personal history of pulmonary embolism: Secondary | ICD-10-CM

## 2020-12-31 DIAGNOSIS — R634 Abnormal weight loss: Secondary | ICD-10-CM | POA: Diagnosis not present

## 2020-12-31 DIAGNOSIS — M62838 Other muscle spasm: Secondary | ICD-10-CM | POA: Diagnosis not present

## 2020-12-31 DIAGNOSIS — K591 Functional diarrhea: Secondary | ICD-10-CM | POA: Diagnosis not present

## 2020-12-31 LAB — POCT URINALYSIS DIPSTICK
Bilirubin, UA: NEGATIVE
Glucose, UA: NEGATIVE
Ketones, UA: NEGATIVE
Nitrite, UA: NEGATIVE
Protein, UA: NEGATIVE
Spec Grav, UA: >=1.03 — AB (ref 1.010–1.025)
Urobilinogen, UA: 0.2 E.U./dL
pH, UA: 5 (ref 5.0–8.0)

## 2020-12-31 NOTE — Assessment & Plan Note (Signed)
This has been stable since our last visit.  Her thyroid ultrasound did not reveal any issue that needed follow-up on moving forward.  Her weight loss previously was likely related to decreased food intake and her chronic diarrhea.

## 2020-12-31 NOTE — Assessment & Plan Note (Signed)
Possibly related to UTI.  We will check a urinalysis today and let her know what the results reveal.

## 2020-12-31 NOTE — Patient Instructions (Signed)
Nice to see you. We will get a urinalysis today.  We will contact you with the results. You can try the baclofen for your neck discomfort.  This may make you drowsy so please be careful with this.  If you do develop drowsiness or your neck discomfort does not improve please let us know.

## 2020-12-31 NOTE — Assessment & Plan Note (Signed)
Spasm and slight tenderness of the right trapezius muscle noted on exam.  I encouraged her to try the baclofen to see if that would be beneficial.  Discussed that this could make her drowsy so she would need to monitor for that.  If not improving she will let us know.

## 2020-12-31 NOTE — Progress Notes (Signed)
[name] Rumps, MD Phone: [phone]  [name] is a 87 y.o. female who presents today for f/u.  History of pulmonary embolus: She continues on Eliquis.  No chest pain or shortness of breath.  No swelling.  Urinary frequency: She notes this has been going on a couple of weeks.  There is also urgency.  No dysuria.  No vaginal itching.  No discharge.  She notes her urine is bright yellow in color.  Chronic diarrhea: Patient notes this is an intermittent issue.  She saw GI previously and they felt as though this was related to a dietary issue.  The patient had been avoiding food triggers such as sweets and fruits though it is difficult to tell if she is still doing that as she notes the diarrhea comes and goes.  Right neck discomfort: This has been going on a couple times a week just recently.  Notes it is a soreness in the muscle.  She has a history of this in the past and took baclofen previously for this with some benefit.  There is no radiation.  She does sleep on that side.  Social History   Tobacco Use  Smoking Status Former Smoker  . Types: Cigarettes  . Quit date: 05/13/1999  . Years since quitting: 21.6  Smokeless Tobacco Never Used    Current Outpatient Medications on File Prior to Visit  Medication Sig Dispense Refill  . albuterol (PROVENTIL HFA;VENTOLIN HFA) 108 (90 Base) MCG/ACT inhaler Inhale 2 puffs into the lungs every 6 (six) hours as needed for wheezing or shortness of breath. 1 Inhaler 1  . ALPRAZolam (XANAX) 0.5 MG tablet Take 0.5 mg by mouth at bedtime as needed for anxiety.    Marland Kitchen apixaban (ELIQUIS) 2.5 MG TABS tablet Take 2.5 mg by mouth 2 (two) times daily.    . baclofen (LIORESAL) 10 MG tablet Take 10 mg by mouth 3 (three) times daily.    . Cholecalciferol (VITAMIN D PO) Take 2,000 mg by mouth daily.    . cholestyramine light (PREVALITE) 4 g packet     . denosumab (PROLIA) 60 MG/ML SOSY injection Prolia 60 mg/mL subcutaneous syringe  Inject 1 mL by  subcutaneous route.    . diphenoxylate-atropine (LOMOTIL) 2.5-0.025 MG tablet Take 1 tablet by mouth 4 (four) times daily as needed for diarrhea or loose stools. 20 tablet 0  . fluticasone (FLONASE) 50 MCG/ACT nasal spray Place 1 spray into both nostrils daily.    Marland Kitchen loperamide (IMODIUM) 2 MG capsule Take 2 mg by mouth as needed for diarrhea or loose stools.    Marland Kitchen losartan (COZAAR) 25 MG tablet TAKE 1 TABLET DAILY 90 tablet 3  . metoprolol tartrate (LOPRESSOR) 50 MG tablet TAKE 1 TABLET TWICE A DAY 180 tablet 3  . montelukast (SINGULAIR) 10 MG tablet     . triamcinolone cream (KENALOG) 0.1 % Apply 1 application topically 2 (two) times daily. For up to 7 days. Do not apply to the face. 30 g 0  . XIIDRA 5 % SOLN Place 1-2 drops into both eyes daily.      No current facility-administered medications on file prior to visit.     ROS see history of present illness  Objective  Physical Exam Vitals:   12/31/20 1054  BP: 118/80  Pulse: 79  Temp: (!) 97.5 F (36.4 C)  SpO2: 97%    BP Readings from Last 3 Encounters:  12/31/20 118/80  10/29/20 118/70  10/01/20 126/66   Wt Readings from Last 3  Encounters:  12/31/20 109 lb 12.8 oz (49.8 kg)  10/29/20 110 lb (49.9 kg)  10/01/20 109 lb (49.4 kg)    Physical Exam Constitutional:      General: She is not in acute distress.    Appearance: She is not diaphoretic.  Cardiovascular:     Rate and Rhythm: Normal rate and regular rhythm.     Heart sounds: Normal heart sounds.  Pulmonary:     Effort: Pulmonary effort is normal.     Breath sounds: Normal breath sounds.  Musculoskeletal:       Arms:  Skin:    General: Skin is warm and dry.  Neurological:     Mental Status: She is alert.     Comments: 5/5 strength bilateral biceps, triceps, handgrip, sensation light touch intact bilateral upper extremities      Assessment/Plan: Please see individual problem list.  Problem List Items Addressed This Visit    History of pulmonary  embolus (PE)    Asymptomatic.  She will continue Eliquis 2.5 mg twice daily.      Diarrhea    This is a chronic intermittent issue.  I encouraged her to pay attention to her food triggers and to avoid them.  She can continue Imodium on an as-needed basis.      Weight loss    This has been stable since our last visit.  Her thyroid ultrasound did not reveal any issue that needed follow-up on moving forward.  Her weight loss previously was likely related to decreased food intake and her chronic diarrhea.      Urinary frequency    Possibly related to UTI.  We will check a urinalysis today and let her know what the results reveal.      Relevant Orders   POCT Urinalysis Dipstick (Completed)   Urine Culture   Trapezius muscle spasm    Spasm and slight tenderness of the right trapezius muscle noted on exam.  I encouraged her to try the baclofen to see if that would be beneficial.  Discussed that this could make her drowsy so she would need to monitor for that.  If not improving she will let us know.         Return in about 3 months (around 04/02/2021).  This visit occurred during the SARS-CoV-2 public health emergency.  Safety protocols were in place, including screening questions prior to the visit, additional usage of staff PPE, and extensive cleaning of exam room while observing appropriate contact time as indicated for disinfecting solutions.    [name] Rumps, MD [place]

## 2020-12-31 NOTE — Assessment & Plan Note (Signed)
This is a chronic intermittent issue.  I encouraged her to pay attention to her food triggers and to avoid them.  She can continue Imodium on an as-needed basis.

## 2020-12-31 NOTE — Assessment & Plan Note (Signed)
Asymptomatic.  She will continue Eliquis 2.5 mg twice daily.

## 2021-01-01 LAB — URINE CULTURE
MICRO NUMBER:: 11978359
SPECIMEN QUALITY:: ADEQUATE

## 2021-01-06 NOTE — Progress Notes (Signed)
Left message to call office back

## 2021-02-11 DIAGNOSIS — Z85828 Personal history of other malignant neoplasm of skin: Secondary | ICD-10-CM | POA: Diagnosis not present

## 2021-02-11 DIAGNOSIS — L570 Actinic keratosis: Secondary | ICD-10-CM | POA: Diagnosis not present

## 2021-02-11 DIAGNOSIS — L298 Other pruritus: Secondary | ICD-10-CM | POA: Diagnosis not present

## 2021-02-11 DIAGNOSIS — Z08 Encounter for follow-up examination after completed treatment for malignant neoplasm: Secondary | ICD-10-CM | POA: Diagnosis not present

## 2021-02-11 DIAGNOSIS — X32XXXA Exposure to sunlight, initial encounter: Secondary | ICD-10-CM | POA: Diagnosis not present

## 2021-02-11 DIAGNOSIS — L821 Other seborrheic keratosis: Secondary | ICD-10-CM | POA: Diagnosis not present

## 2021-03-17 DIAGNOSIS — Z85828 Personal history of other malignant neoplasm of skin: Secondary | ICD-10-CM | POA: Diagnosis not present

## 2021-03-17 DIAGNOSIS — L821 Other seborrheic keratosis: Secondary | ICD-10-CM | POA: Diagnosis not present

## 2021-03-17 DIAGNOSIS — L570 Actinic keratosis: Secondary | ICD-10-CM | POA: Diagnosis not present

## 2021-03-17 DIAGNOSIS — L820 Inflamed seborrheic keratosis: Secondary | ICD-10-CM | POA: Diagnosis not present

## 2021-04-08 ENCOUNTER — Ambulatory Visit (INDEPENDENT_AMBULATORY_CARE_PROVIDER_SITE_OTHER): Payer: Medicare Other | Admitting: Family Medicine

## 2021-04-08 ENCOUNTER — Other Ambulatory Visit: Payer: Self-pay | Admitting: Family Medicine

## 2021-04-08 ENCOUNTER — Encounter: Payer: Self-pay | Admitting: Family Medicine

## 2021-04-08 ENCOUNTER — Other Ambulatory Visit: Payer: Self-pay

## 2021-04-08 DIAGNOSIS — R8290 Unspecified abnormal findings in urine: Secondary | ICD-10-CM

## 2021-04-08 DIAGNOSIS — R350 Frequency of micturition: Secondary | ICD-10-CM | POA: Diagnosis not present

## 2021-04-08 DIAGNOSIS — L814 Other melanin hyperpigmentation: Secondary | ICD-10-CM

## 2021-04-08 DIAGNOSIS — F330 Major depressive disorder, recurrent, mild: Secondary | ICD-10-CM | POA: Diagnosis not present

## 2021-04-08 DIAGNOSIS — I10 Essential (primary) hypertension: Secondary | ICD-10-CM

## 2021-04-08 DIAGNOSIS — K591 Functional diarrhea: Secondary | ICD-10-CM

## 2021-04-08 DIAGNOSIS — R634 Abnormal weight loss: Secondary | ICD-10-CM

## 2021-04-08 LAB — POCT URINALYSIS DIPSTICK
Bilirubin, UA: NEGATIVE
Glucose, UA: NEGATIVE
Ketones, UA: NEGATIVE
Leukocytes, UA: NEGATIVE
Nitrite, UA: NEGATIVE
Protein, UA: NEGATIVE
Spec Grav, UA: 1.025 (ref 1.010–1.025)
Urobilinogen, UA: 0.2 E.U./dL
pH, UA: 5.5 (ref 5.0–8.0)

## 2021-04-08 LAB — COMPREHENSIVE METABOLIC PANEL
ALT: 9 U/L (ref 0–35)
AST: 15 U/L (ref 0–37)
Albumin: 4 g/dL (ref 3.5–5.2)
Alkaline Phosphatase: 33 U/L — ABNORMAL LOW (ref 39–117)
BUN: 15 mg/dL (ref 6–23)
CO2: 26 mEq/L (ref 19–32)
Calcium: 9.8 mg/dL (ref 8.4–10.5)
Chloride: 103 mEq/L (ref 96–112)
Creatinine, Ser: 1.08 mg/dL (ref 0.40–1.20)
GFR: 44.28 mL/min — ABNORMAL LOW (ref >60.00)
Glucose, Bld: 85 mg/dL (ref 70–99)
Potassium: 3.7 mEq/L (ref 3.5–5.1)
Sodium: 139 mEq/L (ref 135–145)
Total Bilirubin: 1.1 mg/dL (ref 0.2–1.2)
Total Protein: 6.8 g/dL (ref 6.0–8.3)

## 2021-04-08 MED ORDER — MIRTAZAPINE 7.5 MG PO TABS: 7.5000 mg | ORAL_TABLET | Freq: Every day | ORAL | 3 refills | Status: DC

## 2021-04-08 NOTE — Patient Instructions (Signed)
Nice to see you. Please try to avoid foods that trigger your diarrhea. We will get lab work today and a urine check.  If your urine does not reveal infection will consider treatment for overactive bladder. I am starting you on Remeron again to see if that will help with your depression as well as her appetite.  If this makes you excessively drowsy please let us know.  Please do not take your melatonin when you start the Remeron so that they do not cause excessive drowsiness together.

## 2021-04-08 NOTE — Assessment & Plan Note (Signed)
Check urinalysis.  Discussed the potential for trialing medication to treat overactive bladder.

## 2021-04-08 NOTE — Assessment & Plan Note (Signed)
Weight has been stable since her last visit.  She has had a very significant diet change since moving here from [place].  I suspect that is playing the biggest role in her weight loss.  Her chronic diarrhea also is likely contributing.  Her depression could be playing a role as well.  We will treat that with mirtazapine to see if that provides some benefit for her appetite as well.

## 2021-04-08 NOTE — Addendum Note (Signed)
Addended by: Neta Ehlers on: 04/08/2021 04:12 PM   Modules accepted: Orders

## 2021-04-08 NOTE — Assessment & Plan Note (Signed)
Adequate control for age.  She will continue her current doses of losartan and metoprolol.

## 2021-04-08 NOTE — Progress Notes (Signed)
[name], MD Phone: [phone]  [name] is a 87 y.o. female who presents today for f/u.  Hypertension: She is taking losartan and metoprolol.  No chest pain or shortness of breath.  She has chronic lower extremity edema that is unchanged.  Skin lesions: Patient notes scattered skin lesions on her arms.  She saw dermatology and was advised to get a lotion to help with her skin though notes no treatments on her arms.  Frequent urination: Patient notes this has been going on intermittently for 1 year.  No dysuria.  She does have urgency.  Feels that her urine is not clear.  No blood in her urine.  Chronic diarrhea: This is an ongoing intermittent issue.  Notes it has improved.  On occasion she still gets some diarrhea.  She uses the Imodium but feels that it does not help much.  No abdominal pain.  She can go a week without having any diarrhea though sometimes has it twice a week.  Typically only occurs twice a day when it does occur.  Weight loss/depression: Weight has been stable over the last 6 months.  She had a CT abdomen and pelvis as well as CT chest back in 2019 at the onset of her weight loss issues that was negative.  Her mammogram is up-to-date.  She has aged out of colonoscopy.  She does note some depression and notes she wants to go back to [place].  Notes her diet has changed significantly since she moved here from [place].  She tries to eat very healthfully.  Tries to eat lots of vegetables.  She notes no SI.  Social History   Tobacco Use  Smoking Status Former   Types: Cigarettes   Quit date: 05/13/1999   Years since quitting: 21.9  Smokeless Tobacco Never    Current Outpatient Medications on File Prior to Visit  Medication Sig Dispense Refill   albuterol (PROVENTIL HFA;VENTOLIN HFA) 108 (90 Base) MCG/ACT inhaler Inhale 2 puffs into the lungs every 6 (six) hours as needed for wheezing or shortness of breath. 1 Inhaler 1   ALPRAZolam (XANAX) 0.5 MG  tablet Take 0.5 mg by mouth at bedtime as needed for anxiety.     apixaban (ELIQUIS) 2.5 MG TABS tablet Take 2.5 mg by mouth 2 (two) times daily.     baclofen (LIORESAL) 10 MG tablet Take 10 mg by mouth 3 (three) times daily.     Cholecalciferol (VITAMIN D PO) Take 2,000 mg by mouth daily.     cholestyramine light (PREVALITE) 4 g packet      denosumab (PROLIA) 60 MG/ML SOSY injection Prolia 60 mg/mL subcutaneous syringe  Inject 1 mL by subcutaneous route.     diphenoxylate-atropine (LOMOTIL) 2.5-0.025 MG tablet Take 1 tablet by mouth 4 (four) times daily as needed for diarrhea or loose stools. 20 tablet 0   fluticasone (FLONASE) 50 MCG/ACT nasal spray Place 1 spray into both nostrils daily.     loperamide (IMODIUM) 2 MG capsule Take 2 mg by mouth as needed for diarrhea or loose stools.     losartan (COZAAR) 25 MG tablet TAKE 1 TABLET DAILY 90 tablet 3   metoprolol tartrate (LOPRESSOR) 50 MG tablet TAKE 1 TABLET TWICE A DAY 180 tablet 3   montelukast (SINGULAIR) 10 MG tablet      triamcinolone cream (KENALOG) 0.1 % Apply 1 application topically 2 (two) times daily. For up to 7 days. Do not apply to the face. 30 g 0   XIIDRA 5 %  SOLN Place 1-2 drops into both eyes daily.      No current facility-administered medications on file prior to visit.     ROS see history of present illness  Objective  Physical Exam Vitals:   04/08/21 1058  BP: 130/88  Pulse: 80  SpO2: 94%    BP Readings from Last 3 Encounters:  04/08/21 130/88  12/31/20 118/80  10/29/20 118/70   Wt Readings from Last 3 Encounters:  04/08/21 109 lb 12.8 oz (49.8 kg)  12/31/20 109 lb 12.8 oz (49.8 kg)  10/29/20 110 lb (49.9 kg)    Physical Exam Constitutional:      General: She is not in acute distress.    Appearance: She is not diaphoretic.  Cardiovascular:     Rate and Rhythm: Normal rate and regular rhythm.     Heart sounds: Normal heart sounds.  Pulmonary:     Effort: Pulmonary effort is normal.     Breath  sounds: Normal breath sounds.  Abdominal:     General: Bowel sounds are normal. There is no distension.     Palpations: Abdomen is soft.     Tenderness: There is no abdominal tenderness. There is no guarding or rebound.  Musculoskeletal:     Right lower leg: No edema.     Left lower leg: No edema.  Skin:    General: Skin is warm and dry.  Neurological:     Mental Status: She is alert.     Assessment/Plan: Please see individual problem list.  Problem List Items Addressed This Visit     Age spots    The lesions on her arm seem consistent with age spots or seborrheic keratoses.  Discussed monitoring.  She can continue to see the dermatologist.      Depression    This is continuing to be an issue.  No SI.  We will trial Remeron again to see how she does.  She will monitor for drowsiness with this.      Relevant Medications   mirtazapine (REMERON) 7.5 MG tablet   Diarrhea    Chronic intermittent issue that has improved.  Suspect this is diet mediated.  She can continue as needed Imodium.  She will try to avoid foods that trigger this.      Hypertension    Adequate control for age.  She will continue her current doses of losartan and metoprolol.      Relevant Orders   Comp Met (CMET)   Urinary frequency    Check urinalysis.  Discussed the potential for trialing medication to treat overactive bladder.      Relevant Orders   POCT Urinalysis Dipstick (Completed)   Weight loss    Weight has been stable since her last visit.  She has had a very significant diet change since moving here from [place].  I suspect that is playing the biggest role in her weight loss.  Her chronic diarrhea also is likely contributing.  Her depression could be playing a role as well.  We will treat that with mirtazapine to see if that provides some benefit for her appetite as well.       Return in about 3 months (around 07/08/2021).  This visit occurred during the SARS-CoV-2 public health  emergency.  Safety protocols were in place, including screening questions prior to the visit, additional usage of staff PPE, and extensive cleaning of exam room while observing appropriate contact time as indicated for disinfecting solutions.    [name], MD [place]  [place]

## 2021-04-08 NOTE — Assessment & Plan Note (Signed)
This is continuing to be an issue.  No SI.  We will trial Remeron again to see how she does.  She will monitor for drowsiness with this.

## 2021-04-08 NOTE — Assessment & Plan Note (Signed)
Chronic intermittent issue that has improved.  Suspect this is diet mediated.  She can continue as needed Imodium.  She will try to avoid foods that trigger this.

## 2021-04-08 NOTE — Assessment & Plan Note (Signed)
The lesions on her arm seem consistent with age spots or seborrheic keratoses.  Discussed monitoring.  She can continue to see the dermatologist.

## 2021-04-09 ENCOUNTER — Encounter: Payer: Self-pay | Admitting: Family Medicine

## 2021-04-09 LAB — URINE CULTURE
MICRO NUMBER:: 12366997
SPECIMEN QUALITY:: ADEQUATE

## 2021-04-09 LAB — URINALYSIS, MICROSCOPIC ONLY: RBC / HPF: NONE SEEN (ref 0–?)

## 2021-04-10 NOTE — Telephone Encounter (Signed)
Patient's son calling back in and states that he does not want this prescribed due to the possible side effects.   States that his mother was very out of sorts this morning so he took her off the medication prescribed yesterday. States the Patient did not know where she was, what she was doing, and could not communicate well.   Patient's son states they are considering assisted living.   Patient states further calls need to come to him and not his mother. He is her health POA.

## 2021-04-11 ENCOUNTER — Telehealth: Payer: Self-pay

## 2021-04-11 DIAGNOSIS — F330 Major depressive disorder, recurrent, mild: Secondary | ICD-10-CM

## 2021-04-14 MED ORDER — MIRTAZAPINE 7.5 MG PO TABS: 7.5000 mg | ORAL_TABLET | Freq: Every day | ORAL | 3 refills | Status: DC

## 2021-04-14 NOTE — Telephone Encounter (Signed)
After speaking with the nurse the son wanted you to send the Remeron to her pharmacy because they will put it in her pill pack on the first of the month so she will be taking it with her other messages, they took the melatonin away.  Edmundo Tedesco,cma

## 2021-04-14 NOTE — Telephone Encounter (Signed)
Noted.  Can you follow-up with them to see how she is feeling now?  I believe there was another message that advised that she was not going to take the Remeron anymore.

## 2021-04-14 NOTE — Telephone Encounter (Signed)
I called and spoke with the son to verify and he did take the melatonin away from the patient and he just suggested to let him in on her care after she finishes with the visit.  I informed him that the medication was sent to the pharmacy.  Keimari ,cma

## 2021-04-14 NOTE — Telephone Encounter (Signed)
Noted. Please see the phone note from the nurse at her facility.

## 2021-04-14 NOTE — Telephone Encounter (Signed)
I [facility] to [facility].

## 2021-05-07 DIAGNOSIS — Z23 Encounter for immunization: Secondary | ICD-10-CM | POA: Diagnosis not present

## 2021-05-15 DIAGNOSIS — L814 Other melanin hyperpigmentation: Secondary | ICD-10-CM | POA: Diagnosis not present

## 2021-05-15 DIAGNOSIS — L905 Scar conditions and fibrosis of skin: Secondary | ICD-10-CM | POA: Diagnosis not present

## 2021-05-31 ENCOUNTER — Emergency Department: Payer: Medicare Other

## 2021-05-31 ENCOUNTER — Emergency Department
Admission: EM | Admit: 2021-05-31 | Discharge: 2021-05-31 | Disposition: A | Discharge status: Home / Self Care | Payer: Medicare Other | Attending: Emergency Medicine | Admitting: Emergency Medicine

## 2021-05-31 ENCOUNTER — Other Ambulatory Visit: Payer: Self-pay

## 2021-05-31 DIAGNOSIS — W01198A Fall on same level from slipping, tripping and stumbling with subsequent striking against other object, initial encounter: Secondary | ICD-10-CM | POA: Insufficient documentation

## 2021-05-31 DIAGNOSIS — Z043 Encounter for examination and observation following other accident: Secondary | ICD-10-CM | POA: Diagnosis not present

## 2021-05-31 DIAGNOSIS — S0101XA Laceration without foreign body of scalp, initial encounter: Secondary | ICD-10-CM | POA: Diagnosis not present

## 2021-05-31 DIAGNOSIS — F0390 Unspecified dementia without behavioral disturbance: Secondary | ICD-10-CM | POA: Insufficient documentation

## 2021-05-31 DIAGNOSIS — I4891 Unspecified atrial fibrillation: Secondary | ICD-10-CM | POA: Diagnosis not present

## 2021-05-31 DIAGNOSIS — Z79899 Other long term (current) drug therapy: Secondary | ICD-10-CM | POA: Insufficient documentation

## 2021-05-31 DIAGNOSIS — M7989 Other specified soft tissue disorders: Secondary | ICD-10-CM | POA: Diagnosis not present

## 2021-05-31 DIAGNOSIS — I6782 Cerebral ischemia: Secondary | ICD-10-CM | POA: Diagnosis not present

## 2021-05-31 DIAGNOSIS — J323 Chronic sphenoidal sinusitis: Secondary | ICD-10-CM | POA: Diagnosis not present

## 2021-05-31 DIAGNOSIS — W19XXXA Unspecified fall, initial encounter: Secondary | ICD-10-CM

## 2021-05-31 DIAGNOSIS — Z87891 Personal history of nicotine dependence: Secondary | ICD-10-CM | POA: Insufficient documentation

## 2021-05-31 DIAGNOSIS — Z853 Personal history of malignant neoplasm of breast: Secondary | ICD-10-CM | POA: Diagnosis not present

## 2021-05-31 DIAGNOSIS — I6523 Occlusion and stenosis of bilateral carotid arteries: Secondary | ICD-10-CM | POA: Diagnosis not present

## 2021-05-31 DIAGNOSIS — S199XXA Unspecified injury of neck, initial encounter: Secondary | ICD-10-CM | POA: Diagnosis not present

## 2021-05-31 DIAGNOSIS — Z86711 Personal history of pulmonary embolism: Secondary | ICD-10-CM | POA: Diagnosis not present

## 2021-05-31 DIAGNOSIS — S63111A Subluxation of metacarpophalangeal joint of right thumb, initial encounter: Secondary | ICD-10-CM | POA: Diagnosis not present

## 2021-05-31 DIAGNOSIS — Z96651 Presence of right artificial knee joint: Secondary | ICD-10-CM | POA: Insufficient documentation

## 2021-05-31 DIAGNOSIS — S0990XA Unspecified injury of head, initial encounter: Secondary | ICD-10-CM | POA: Diagnosis not present

## 2021-05-31 DIAGNOSIS — M47812 Spondylosis without myelopathy or radiculopathy, cervical region: Secondary | ICD-10-CM | POA: Diagnosis not present

## 2021-05-31 DIAGNOSIS — I10 Essential (primary) hypertension: Secondary | ICD-10-CM | POA: Insufficient documentation

## 2021-05-31 DIAGNOSIS — Z7901 Long term (current) use of anticoagulants: Secondary | ICD-10-CM | POA: Insufficient documentation

## 2021-05-31 DIAGNOSIS — M4319 Spondylolisthesis, multiple sites in spine: Secondary | ICD-10-CM | POA: Diagnosis not present

## 2021-05-31 DIAGNOSIS — J320 Chronic maxillary sinusitis: Secondary | ICD-10-CM | POA: Diagnosis not present

## 2021-05-31 NOTE — ED Triage Notes (Signed)
Pt arrived via to ed via [place]ov [place]. Pt fell this morning hitting head. Small lac or abrasion on back of head, no active bleeding. Pt takes eliquis. Pt c/o rt thumb pain. Pt denies loc, vision changes, headache. Pt alert in triage and oriented to situation

## 2021-05-31 NOTE — ED Provider Notes (Signed)
[facility] Emergency Department Provider Note  ____________________________________________   Event Date/Time   First MD Initiated Contact with Patient 05/31/21 1005     (approximate)  I have reviewed the triage vital signs and the nursing notes.   HISTORY  Chief Complaint Fall   HPI [name] is a 87 y.o. female with past medical history of A. fib and history of PE on Eliquis, osteoporosis, HTN, left breast cancer who presents accompanied by family for assessment after patient reportedly fell hitting the back of her head yesterday evening.  Patient states she is coming to get checked out because staff wanted her to get checked out.  She notes she is typically supposed to use a walker to get around but she was not using when she fell and think she lost her balance falling backwards.  She denies any associated lightheadedness, dizziness, chest pain, cough, shortness of breath, abdominal pain, back pain or any current pain including in the head, neck, extremities or torso.  No other recent falls.  She denies any recent urinary symptoms.  She states she has no other concerns at this time and is ready to go back home.         Past Medical History:  Diagnosis Date   A-fib [facility])    Allergy    Breast cancer ([place]) 07/22/2012   T1c, Nx carcinoma left breast, ER 90%, PR 90%, HER-2/neu not over expressed.   Hypertension    Insomnia    Osteoporosis    Prolia 10/2012   PAT (paroxysmal atrial tachycardia) ([place])    a. Dx 03/2019 - improved w/ beta blocker; b. 03/2019 Echo: EF 55-60%. Impaired relaxation. RVSP 48.12mHg. Mild to mod MR/TR.   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   Ankle tightness 01/04/2019   Neck pain 12/13/2018   Ear pain 12/13/2018   Subconjunctival hemorrhage of left eye 02/13/2018   Positional right arm tingling while sleeping 10/01/2017   Anxiety 12/14/2016   Nocturia 12/14/2016   Varicose veins of both lower extremities 09/11/2016   Rash and nonspecific skin eruption 07/13/2016   Dry eyes 06/11/2016   Fall 03/11/2016   Tremor 01/16/2016   Seasonal allergic rhinitis 02/28/2015   Diarrhea 01/15/2015   Cold intolerance 11/26/2014   Osteoarthritis of left knee 08/31/2014   Gait instability 08/31/2014   Memory loss 08/31/2014   Seborrheic keratoses 12/28/2012   Osteopenia 09/22/2012   History of breast cancer 06/02/2012   Depression 01/08/2012   History of pulmonary embolus (PE) 12/03/2011   Insomnia 05/13/2011   Hypertension 05/13/2011    Past Surgical History:  Procedure Laterality Date   BLADDER SURGERY     BREAST SURGERY Left 2013   mastectomy   CHOLECYSTECTOMY     HERNIA REPAIR     MASTECTOMY Left 2013   BREAST CA   REPLACEMENT TOTAL KNEE     right   TONSILLECTOMY      Prior to Admission medications   Medication Sig Start Date End Date Taking? Authorizing Provider  albuterol (  PROVENTIL HFA;VENTOLIN HFA) 108 (90 Base) MCG/ACT inhaler Inhale 2 puffs into the lungs every 6 (six) hours as needed for wheezing or shortness of breath. 09/01/18   Jodelle Green, FNP  ALPRAZolam Duanne Moron) 0.5 MG tablet Take 0.5 mg by mouth at bedtime as needed for anxiety.    [provider]  apixaban (ELIQUIS) 2.5 MG TABS tablet Take 2.5 mg by mouth 2 (two) times daily.    [provider]  baclofen (LIORESAL) 10 MG tablet Take 10 mg by mouth 3 (three) times daily.    [provider]  Cholecalciferol (VITAMIN D PO) Take 2,000 mg by mouth daily.    [provider]  cholestyramine light (PREVALITE) 4 g packet  05/14/20   [provider]  denosumab (PROLIA) 60 MG/ML SOSY injection Prolia 60 mg/mL subcutaneous syringe  Inject 1 mL by subcutaneous route.    [provider]  diphenoxylate-atropine (LOMOTIL) 2.5-0.025 MG tablet Take 1 tablet by mouth 4 (four) times daily as needed for diarrhea or loose stools. 11/28/19   [name], MD  fluticasone (FLONASE) 50 MCG/ACT nasal spray Place 1 spray into both nostrils daily.    [provider]  loperamide (IMODIUM) 2 MG capsule Take 2 mg by mouth as needed for diarrhea or loose stools.    [provider]  losartan (COZAAR) 25 MG tablet TAKE 1 TABLET DAILY 09/30/20   [name], MD  metoprolol tartrate (LOPRESSOR) 50 MG tablet TAKE 1 TABLET TWICE A DAY 07/12/20   [name], MD  mirtazapine (REMERON) 7.5 MG tablet Take 1 tablet (7.5 mg total) by mouth at bedtime. 04/14/21   [name], MD  montelukast (SINGULAIR) 10 MG tablet  07/22/19   [provider]  triamcinolone cream (KENALOG) 0.1 % Apply 1 application topically 2 (two) times daily. For up to 7 days. Do not apply to the face. 04/19/19   [name], MD  XIIDRA 5 % SOLN Place 1-2 drops into both eyes daily.  12/06/17   [provider]    Allergies Erythromycin and Sulfa drugs cross reactors  Family History  Problem Relation Age of Onset   COPD Mother    Stroke Father    Breast cancer Maternal Aunt     Social History Social History   Tobacco Use   Smoking status: Former    Types: Cigarettes    Quit date: 05/13/1999    Years since quitting: 22.0   Smokeless tobacco: Never  Vaping Use   Vaping Use: Never used  Substance Use Topics   Alcohol use: No   Drug use: No    Review of Systems  Review of Systems  Constitutional:  Negative for chills and fever.  HENT:  Negative for sore throat.   Eyes:  Negative for pain.  Respiratory:  Negative for cough and  stridor.   Cardiovascular:  Negative for chest pain.  Gastrointestinal:  Negative for vomiting.  Genitourinary:  Negative for dysuria.  Musculoskeletal:  Negative for myalgias.  Skin:  Negative for rash.  Neurological:  Negative for seizures, loss of consciousness and headaches.  Psychiatric/Behavioral:  Negative for suicidal ideas.   All other systems reviewed and are negative.    ____________________________________________   PHYSICAL EXAM:  VITAL SIGNS: ED Triage Vitals  Enc Vitals Group     BP 05/31/21 0843 132/64     Pulse Rate 05/31/21 0843 87     Resp 05/31/21 0843 20     Temp 05/31/21  0843 98.1 F (36.7 C)     Temp Source 05/31/21 0843 Oral     SpO2 05/31/21 0843 97 %     Weight 05/31/21 0843 111 lb (50.3 kg)     Height 05/31/21 0843 5' 2"  (1.575 m)     Head Circumference --      Peak Flow --      Pain Score 05/31/21 0853 7     Pain Loc --      Pain Edu? --      Excl. in [place]? --    Vitals:   05/31/21 0843 05/31/21 1142  BP: 132/64 136/73  Pulse: 87 83  Resp: 20 18  Temp: 98.1 F (36.7 C)   SpO2: 97% 96%   Physical Exam Vitals and nursing note reviewed.  Constitutional:      General: She is not in acute distress.    Appearance: She is well-developed.  HENT:     Head: Normocephalic.     Right Ear: External ear normal.     Left Ear: External ear normal.     Nose: Nose normal.  Eyes:     Conjunctiva/sclera: Conjunctivae normal.  Cardiovascular:     Rate and Rhythm: Normal rate and regular rhythm.     Heart sounds: No murmur heard. Pulmonary:     Effort: Pulmonary effort is normal. No respiratory distress.     Breath sounds: Normal breath sounds.  Abdominal:     Palpations: Abdomen is soft.     Tenderness: There is no abdominal tenderness.  Musculoskeletal:     Cervical back: Neck supple.  Skin:    General: Skin is warm and dry.  Neurological:     Mental Status: She is alert. Mental status is at baseline.     Cranial Nerves: No cranial nerve  deficit.  Psychiatric:        Mood and Affect: Mood normal.    Patient has full strength and range of motion in the bilateral upper and lower extremities with exception of slightly decreased range of motion at the right thumb at the distal interphalangeal joint.  She is able to flex and extend this although with some discomfort.  2+ radial pulses.  Sensation is intact to light touch in all extremities.  Cranial nerves II to XII are grossly intact.  There is some mild C-spine tenderness but no step-offs or deformities and no tenderness over the T or L-spine.  No trauma evident on exam of the chest or abdomen.  Oropharynx and face is unremarkable.  There is a 0.5 cm linear hemostatic laceration of the posterior scalp without any underlying significant hematoma or palpable skull fracture. ____________________________________________   LABS (all labs ordered are listed, but only abnormal results are displayed)  Labs Reviewed - No data to display ____________________________________________  EKG  Chest x-ray remarkable for sinus rhythm with a ventricular rate of 83, normal axis, PR normal at 106 with otherwise unremarkable intervals and no evidence of acute ischemia or significant arrhythmia. ____________________________________________  RADIOLOGY  ED MD interpretation: CT head without evidence of skull fracture or intracranial hemorrhage or other acute cranial process.  There is some generalized cerebral volume loss and moderate chronic small vessel ischemic changes noted.  There is also some evidence of some chronic sinusitis.  CT C-spine shows some multilevel cervical spondylosis without evidence of fracture or other clear acute injury.  Chest x-ray shows mild cardiomegaly without pulmonary edema, focal consolidation, pneumothorax, rib fracture or any other clear acute process.  Plain, the right thumb is unremarkable for any fracture with some very mild radial subluxation of the distal  phalanx.  Official radiology report(s): DG Chest 2 View  Result Date: 05/31/2021 CLINICAL DATA:  Fall. EXAM: CHEST - 2 VIEW COMPARISON:  Chest radiograph 07/04/2012 FINDINGS: Of note, patient is mildly rotated for this portable examination. Mildly enlarged cardiac silhouette, which may be exaggerated by portable technique and patient positioning. Tortuous thoracic aorta. Both lungs are clear. No pleural effusion or pneumothorax. Diffuse osteopenia. Multilevel degenerative changes in the mid to distal thoracic and upper lumbar spine. No acute osseous abnormality. Partially visualized IVC filter in the upper abdomen. Vertical lucencies overlying the right lower hemithorax likely represent skin folds. IMPRESSION: Mildly enlarged cardiac silhouette which may be exaggerated by portable technique and patient positioning. Lungs are clear. Electronically Signed   By: Ileana Roup M.D.   On: 05/31/2021 11:25   CT Head Wo Contrast  Result Date: 05/31/2021 CLINICAL DATA:  Head trauma, minor (Age >= 65y) fall hitting head on blood thinners EXAM: CT HEAD WITHOUT CONTRAST TECHNIQUE: Contiguous axial images were obtained from the base of the skull through the vertex without intravenous contrast. COMPARISON:  06/01/2019 head CT. FINDINGS: Brain: Generalized cerebral volume loss. No evidence of parenchymal hemorrhage or extra-axial fluid collection. No mass lesion, mass effect, or midline shift. No CT evidence of acute infarction. Nonspecific moderate subcortical and periventricular white matter hypodensity, most in keeping with chronic small vessel ischemic change. Cerebral ventricle sizes are stable and concordant with the degree of cerebral volume loss. Vascular: No acute abnormality. Skull: No evidence of calvarial fracture. Sinuses/Orbits: Mucoperiosteal thickening in the left maxillary sinus. Near complete opacification of the sphenoid sinus. Other:  The mastoid air cells are unopacified. IMPRESSION: 1. No evidence of  acute intracranial abnormality. No evidence of calvarial fracture. 2. Generalized cerebral volume loss and moderate chronic small vessel ischemic changes in the cerebral white matter. 3. Left maxillary and sphenoid sinusitis, probably chronic. Electronically Signed   By: Ilona Sorrel M.D.   On: 05/31/2021 10:09   CT Cervical Spine Wo Contrast  Result Date: 05/31/2021 CLINICAL DATA:  Neck trauma EXAM: CT CERVICAL SPINE WITHOUT CONTRAST TECHNIQUE: Multidetector CT imaging of the cervical spine was performed without intravenous contrast. Multiplanar CT image reconstructions were also generated. COMPARISON:  None. FINDINGS: Alignment: Anterolisthesis C3-4, C4-5, C7-T1 which appears degenerative Skull base and vertebrae: Negative for fracture Soft tissues and spinal canal: Negative for mass or soft tissue edema. Atherosclerotic calcification in the carotid artery bilaterally. Disc levels: Multilevel disc and facet degeneration throughout the cervical spine. No significant spinal stenosis. Foraminal narrowing bilaterally at C4-5 due to spurring. Upper chest: Lung apices clear bilaterally Other: None IMPRESSION: Multilevel cervical spondylosis.  Negative for cervical fracture. Electronically Signed   By: Franchot Gallo M.D.   On: 05/31/2021 11:50   DG Finger Thumb Right  Result Date: 05/31/2021 DISTAL ASPECT OF THE THUMB.  IP JOINT CLINICAL DATA: Right thumb pain status post fall last night. Pain is at medial aspect of IP joint. No previous injuries to hand. EXAM: RIGHT THUMB 2+V COMPARISON:  None. FINDINGS: There is no evidence of fracture. Mild subluxation of the thumb distal phalanx in the radial direction. Severe osteoarthritis of the thumb IP, CMP, and CMC joints as well as the STT joint. Soft tissue swelling at the level of the thumb IP joint. IMPRESSION: No fracture.  Mild radial subluxation of the thumb distal phalanx. Electronically Signed   By: Nadeen Landau.D.  On: 05/31/2021 10:08     ____________________________________________   PROCEDURES  Procedure(s) performed (including Critical Care):  Procedures   ____________________________________________   INITIAL IMPRESSION / ASSESSMENT AND PLAN / ED COURSE      Patient presents with above-stated history exam for assessment after a fall that occurred last night with patient stating she was not using her walker and lost her balance falling backward hitting her head.  She states she is she is not currently in any pain at all.  On arrival she is afebrile hemodynamically stable.  She has a very small linear hemostatic lack on her posterior scalp that is already scabbed over and not requiring closure at this time.  There is also slight amount of erythema around distal interphalangeal joint of the right thumb which patient has some mild discomfort on flexion extension on.  Otherwise no evidence of trauma on exam patient is at her neurological baseline with no focal deficits.  She is denying any other recent sick symptoms or clear precipitating events.  Given her age and the fact she is on Eliquis a CT head and C-spine were obtained as well as a chest x-ray and plain film of the right thumb which she is a little bit of pain when flexing extending.  These are unremarkable for acute fracture or intracranial hemorrhage or other clear acute injury other than some persistent mild radial subluxation of the right distal phalanx which is not as evident on exam and given patient is able to range think he requires emergent reduction at this time.  Impression is patient to the thumb.  EKG not suggestive of significant arrhythmia or ischemia.  At this time I have a low suspicion for preceding presyncope and I suspect likely mechanical in the setting some baseline balance issues and patient not using her walker.  Wound cleaned with warm soapy water per patient by nursing staff at facility prior to arrival. Discussed with patient and son importance  of using walker at all times avoiding losing her balance.  Discharged stable condition.  Strict and precautions advised and discussed.  Recommended to send the patient to use warm soapy water to keep the wound clean over the next couple days.   ____________________________________________   FINAL CLINICAL IMPRESSION(S) / ED DIAGNOSES  Final diagnoses:  Fall, initial encounter  Laceration of scalp, initial encounter    Medications - No data to display   ED Discharge Orders     None        Note:  This document was prepared using Dragon voice recognition software and may include unintentional dictation errors.    Lucrezia Starch, MD 05/31/21 1210

## 2021-06-05 ENCOUNTER — Ambulatory Visit: Payer: Medicare Other

## 2021-06-05 DIAGNOSIS — Z23 Encounter for immunization: Secondary | ICD-10-CM | POA: Diagnosis not present

## 2021-06-10 DIAGNOSIS — Z961 Presence of intraocular lens: Secondary | ICD-10-CM | POA: Diagnosis not present

## 2021-06-12 ENCOUNTER — Ambulatory Visit (INDEPENDENT_AMBULATORY_CARE_PROVIDER_SITE_OTHER): Payer: Medicare Other

## 2021-06-12 ENCOUNTER — Other Ambulatory Visit: Payer: Self-pay

## 2021-06-12 DIAGNOSIS — M8580 Other specified disorders of bone density and structure, unspecified site: Secondary | ICD-10-CM

## 2021-06-12 MED ORDER — DENOSUMAB 60 MG/ML ~~LOC~~ SOSY
60.0000 mg | PREFILLED_SYRINGE | Freq: Once | SUBCUTANEOUS | Status: AC
Start: 2021-06-12 — End: 2021-06-12
  Administered 2021-06-12: 11:00:00 60 mg via SUBCUTANEOUS

## 2021-06-12 NOTE — Progress Notes (Signed)
Pt came in today for Prolia injection given in left arm SQ. Patient tolerated well with no signs of distress.

## 2021-06-30 ENCOUNTER — Other Ambulatory Visit: Payer: Self-pay | Admitting: General Surgery

## 2021-06-30 ENCOUNTER — Other Ambulatory Visit: Payer: Self-pay | Admitting: Family Medicine

## 2021-06-30 DIAGNOSIS — Z1231 Encounter for screening mammogram for malignant neoplasm of breast: Secondary | ICD-10-CM

## 2021-07-01 ENCOUNTER — Other Ambulatory Visit: Payer: Self-pay | Admitting: General Surgery

## 2021-07-01 DIAGNOSIS — Z853 Personal history of malignant neoplasm of breast: Secondary | ICD-10-CM

## 2021-07-07 ENCOUNTER — Other Ambulatory Visit: Payer: Self-pay | Admitting: Family Medicine

## 2021-07-08 ENCOUNTER — Other Ambulatory Visit: Payer: Self-pay

## 2021-07-08 ENCOUNTER — Ambulatory Visit (INDEPENDENT_AMBULATORY_CARE_PROVIDER_SITE_OTHER): Payer: Medicare Other | Admitting: Family Medicine

## 2021-07-08 ENCOUNTER — Encounter: Payer: Self-pay | Admitting: Family Medicine

## 2021-07-08 VITALS — BP 138/80 | HR 89 | Temp 97.5°F | Ht 60.0 in | Wt 108.0 lb

## 2021-07-08 DIAGNOSIS — R350 Frequency of micturition: Secondary | ICD-10-CM

## 2021-07-08 DIAGNOSIS — R413 Other amnesia: Secondary | ICD-10-CM | POA: Diagnosis not present

## 2021-07-08 DIAGNOSIS — W19XXXA Unspecified fall, initial encounter: Secondary | ICD-10-CM

## 2021-07-08 DIAGNOSIS — Z86711 Personal history of pulmonary embolism: Secondary | ICD-10-CM | POA: Diagnosis not present

## 2021-07-08 DIAGNOSIS — F03A0 Unspecified dementia, mild, without behavioral disturbance, psychotic disturbance, mood disturbance, and anxiety: Secondary | ICD-10-CM | POA: Diagnosis not present

## 2021-07-08 MED ORDER — GEMTESA 75 MG PO TABS: 75.0000 mg | ORAL_TABLET | Freq: Every day | ORAL | 2 refills | Status: DC

## 2021-07-08 NOTE — Assessment & Plan Note (Signed)
History of PE in the past.  I discussed the importance of her taking her Eliquis twice daily.  Discussed the risk of developing a clot if she does not take it appropriately.  She was advised to take her Eliquis 2.5 mg twice daily.

## 2021-07-08 NOTE — Assessment & Plan Note (Signed)
The patient continues to have some memory difficulty.  She failed her mini cog testing today.  We will check a TSH and B12.  She may need to see neurology moving forward.

## 2021-07-08 NOTE — Progress Notes (Signed)
[name] Rumps, MD Phone: [phone]  [name] is a 87 y.o. female who presents today for follow-up.  History of pulmonary embolism: Patient has an IVC filter in place.  She notes no chest pain or shortness of breath.  No swelling.  She is only been taking her Eliquis about once daily.  Urinary frequency: Patient notes she goes frequently and only pees a few drops.  She notes no dysuria.  She notes some urgency.  No hematuria.  Her son notes that she is not clearly peeing any more frequently than usual.  She wonders about medication for this.  Fall: Patient notes she was bent over a table around midnight and stood up and started to walk away from the table.  She then fell.  She had an injury to her scalp which has healed up well.  She was evaluated in the emergency room and had negative CT imaging.  Memory difficulty: Patient and her son report trouble with her memory.  She notes she has issues with numbers.  Her son reports she has trouble remembering to take her medication.  This has been an ongoing issue.  Social History   Tobacco Use  Smoking Status Former   Types: Cigarettes   Quit date: 05/13/1999   Years since quitting: 22.1  Smokeless Tobacco Never    Current Outpatient Medications on File Prior to Visit  Medication Sig Dispense Refill   albuterol (PROVENTIL HFA;VENTOLIN HFA) 108 (90 Base) MCG/ACT inhaler Inhale 2 puffs into the lungs every 6 (six) hours as needed for wheezing or shortness of breath. 1 Inhaler 1   ALPRAZolam (XANAX) 0.5 MG tablet Take 0.5 mg by mouth at bedtime as needed for anxiety.     apixaban (ELIQUIS) 2.5 MG TABS tablet Take 2.5 mg by mouth 2 (two) times daily.     baclofen (LIORESAL) 10 MG tablet Take 10 mg by mouth 3 (three) times daily.     Cholecalciferol (VITAMIN D PO) Take 2,000 mg by mouth daily.     cholestyramine light (PREVALITE) 4 g packet      denosumab (PROLIA) 60 MG/ML SOSY injection Prolia 60 mg/mL subcutaneous syringe  Inject 1  mL by subcutaneous route.     diphenoxylate-atropine (LOMOTIL) 2.5-0.025 MG tablet Take 1 tablet by mouth 4 (four) times daily as needed for diarrhea or loose stools. 20 tablet 0   fluticasone (FLONASE) 50 MCG/ACT nasal spray Place 1 spray into both nostrils daily.     loperamide (IMODIUM) 2 MG capsule Take 2 mg by mouth as needed for diarrhea or loose stools.     losartan (COZAAR) 25 MG tablet TAKE 1 TABLET DAILY 90 tablet 3   metoprolol tartrate (LOPRESSOR) 50 MG tablet TAKE 1 TABLET TWICE A DAY 180 tablet 3   mirtazapine (REMERON) 7.5 MG tablet Take 1 tablet (7.5 mg total) by mouth at bedtime. 30 tablet 3   montelukast (SINGULAIR) 10 MG tablet      triamcinolone cream (KENALOG) 0.1 % Apply 1 application topically 2 (two) times daily. For up to 7 days. Do not apply to the face. 30 g 0   XIIDRA 5 % SOLN Place 1-2 drops into both eyes daily.      No current facility-administered medications on file prior to visit.     ROS see history of present illness  Objective  Physical Exam Vitals:   07/08/21 1204  BP: 138/80  Pulse: 89  Temp: (!) 97.5 F (36.4 C)  SpO2: 98%    BP Readings  from Last 3 Encounters:  07/08/21 138/80  05/31/21 136/73  04/08/21 130/88   Wt Readings from Last 3 Encounters:  07/08/21 108 lb (49 kg)  05/31/21 111 lb (50.3 kg)  04/08/21 109 lb 12.8 oz (49.8 kg)    Physical Exam Constitutional:      General: She is not in acute distress.    Appearance: She is not diaphoretic.  HENT:     Head: Normocephalic and atraumatic.  Cardiovascular:     Rate and Rhythm: Normal rate and regular rhythm.     Heart sounds: Normal heart sounds.  Pulmonary:     Effort: Pulmonary effort is normal.     Breath sounds: Normal breath sounds.  Musculoskeletal:     Right lower leg: No edema.     Left lower leg: No edema.  Skin:    General: Skin is warm and dry.  Neurological:     Mental Status: She is alert.   2/5 mini cog testing  Assessment/Plan: Please see  individual problem list.  Problem List Items Addressed This Visit     Dementia [facility])    The patient continues to have some memory difficulty.  She failed her mini cog testing today.  We will check a TSH and B12.  She may need to see neurology moving forward.      Fall    Patient with a recent fall.  It is certainly possible that she got off balance or got lightheaded though she does not remember the exact circumstances of what led her to fall.  I advised that she needs to use her rolling walker at all times.  Her scalp appears to have healed up quite well.      History of pulmonary embolus (PE)    History of PE in the past.  I discussed the importance of her taking her Eliquis twice daily.  Discussed the risk of developing a clot if she does not take it appropriately.  She was advised to take her Eliquis 2.5 mg twice daily.      Urinary frequency    This is a chronic intermittent issue.  We will check her urinalysis.  I sent in [place].  Discussed that this would be the safest option to start with.  If it is excessively expensive they will let us know.      Other Visit Diagnoses     Urine frequency    -  Primary   Relevant Medications   Vibegron (GEMTESA) 75 MG TABS (Start on 07/27/2021)   Other Relevant Orders   POCT Urinalysis Dipstick   Memory difficulty       Relevant Orders   B12   TSH       Return in about 3 months (around 10/06/2021).  This visit occurred during the SARS-CoV-2 public health emergency.  Safety protocols were in place, including screening questions prior to the visit, additional usage of staff PPE, and extensive cleaning of exam room while observing appropriate contact time as indicated for disinfecting solutions.    [name] Rumps, MD [place]

## 2021-07-08 NOTE — Patient Instructions (Addendum)
Nice to see you. We will check your urine today. I sent Gemtesa in for you to start on for your overactive bladder.  Please have your son check to see how expensive this is going to be.  If it is excessively expensive please let me know. We will also check labs to help determine if you have a b12 deficiency or thyroid issue that is contributing to your memory issues.

## 2021-07-08 NOTE — Assessment & Plan Note (Signed)
This is a chronic intermittent issue.  We will check her urinalysis.  I sent in [place].  Discussed that this would be the safest option to start with.  If it is excessively expensive they will let us know.

## 2021-07-08 NOTE — Assessment & Plan Note (Signed)
Patient with a recent fall.  It is certainly possible that she got off balance or got lightheaded though she does not remember the exact circumstances of what led her to fall.  I advised that she needs to use her rolling walker at all times.  Her scalp appears to have healed up quite well.

## 2021-07-10 ENCOUNTER — Telehealth: Payer: Self-pay | Admitting: Family Medicine

## 2021-07-10 LAB — POCT URINALYSIS DIPSTICK
Bilirubin, UA: NEGATIVE
Blood, UA: NEGATIVE
Glucose, UA: NEGATIVE
Ketones, UA: NEGATIVE
Leukocytes, UA: NEGATIVE
Nitrite, UA: NEGATIVE
Protein, UA: NEGATIVE
Spec Grav, UA: 1.025 (ref 1.010–1.025)
Urobilinogen, UA: 0.2 E.U./dL
pH, UA: 5.5 (ref 5.0–8.0)

## 2021-07-10 NOTE — Telephone Encounter (Signed)
Pt son called in request for you to call Pt about  her lab results. Pt son stated he still need a price quote for new prescription for Pt. Pt son stated to not fill the prescription until someone hear back from him stating its ok. Pt/Pt son is requesting callback

## 2021-07-14 ENCOUNTER — Telehealth: Payer: Self-pay

## 2021-07-14 NOTE — Telephone Encounter (Signed)
Noted.  Can you contact the patient and see if she would be willing to do pelvic floor physical therapy?

## 2021-07-15 NOTE — Telephone Encounter (Signed)
Noted  

## 2021-07-30 DIAGNOSIS — R2681 Unsteadiness on feet: Secondary | ICD-10-CM | POA: Diagnosis not present

## 2021-07-30 DIAGNOSIS — N39498 Other specified urinary incontinence: Secondary | ICD-10-CM | POA: Diagnosis not present

## 2021-07-30 DIAGNOSIS — Z9181 History of falling: Secondary | ICD-10-CM | POA: Diagnosis not present

## 2021-07-30 DIAGNOSIS — R2689 Other abnormalities of gait and mobility: Secondary | ICD-10-CM | POA: Diagnosis not present

## 2021-07-30 DIAGNOSIS — M6281 Muscle weakness (generalized): Secondary | ICD-10-CM | POA: Diagnosis not present

## 2021-08-01 DIAGNOSIS — R2689 Other abnormalities of gait and mobility: Secondary | ICD-10-CM | POA: Diagnosis not present

## 2021-08-01 DIAGNOSIS — R2681 Unsteadiness on feet: Secondary | ICD-10-CM | POA: Diagnosis not present

## 2021-08-01 DIAGNOSIS — N39498 Other specified urinary incontinence: Secondary | ICD-10-CM | POA: Diagnosis not present

## 2021-08-01 DIAGNOSIS — M6281 Muscle weakness (generalized): Secondary | ICD-10-CM | POA: Diagnosis not present

## 2021-08-01 DIAGNOSIS — Z9181 History of falling: Secondary | ICD-10-CM | POA: Diagnosis not present

## 2021-08-05 DIAGNOSIS — N39498 Other specified urinary incontinence: Secondary | ICD-10-CM | POA: Diagnosis not present

## 2021-08-05 DIAGNOSIS — R2689 Other abnormalities of gait and mobility: Secondary | ICD-10-CM | POA: Diagnosis not present

## 2021-08-05 DIAGNOSIS — R2681 Unsteadiness on feet: Secondary | ICD-10-CM | POA: Diagnosis not present

## 2021-08-05 DIAGNOSIS — M6281 Muscle weakness (generalized): Secondary | ICD-10-CM | POA: Diagnosis not present

## 2021-08-05 DIAGNOSIS — Z9181 History of falling: Secondary | ICD-10-CM | POA: Diagnosis not present

## 2021-08-07 DIAGNOSIS — R2689 Other abnormalities of gait and mobility: Secondary | ICD-10-CM | POA: Diagnosis not present

## 2021-08-07 DIAGNOSIS — R2681 Unsteadiness on feet: Secondary | ICD-10-CM | POA: Diagnosis not present

## 2021-08-07 DIAGNOSIS — M6281 Muscle weakness (generalized): Secondary | ICD-10-CM | POA: Diagnosis not present

## 2021-08-07 DIAGNOSIS — Z9181 History of falling: Secondary | ICD-10-CM | POA: Diagnosis not present

## 2021-08-07 DIAGNOSIS — N39498 Other specified urinary incontinence: Secondary | ICD-10-CM | POA: Diagnosis not present

## 2021-08-12 DIAGNOSIS — Z9181 History of falling: Secondary | ICD-10-CM | POA: Diagnosis not present

## 2021-08-12 DIAGNOSIS — R2681 Unsteadiness on feet: Secondary | ICD-10-CM | POA: Diagnosis not present

## 2021-08-12 DIAGNOSIS — N39498 Other specified urinary incontinence: Secondary | ICD-10-CM | POA: Diagnosis not present

## 2021-08-12 DIAGNOSIS — M6281 Muscle weakness (generalized): Secondary | ICD-10-CM | POA: Diagnosis not present

## 2021-08-12 DIAGNOSIS — R2689 Other abnormalities of gait and mobility: Secondary | ICD-10-CM | POA: Diagnosis not present

## 2021-08-14 ENCOUNTER — Ambulatory Visit
Admission: RE | Admit: 2021-08-14 | Discharge: 2021-08-14 | Disposition: A | Discharge status: Home / Self Care | Payer: Medicare Other | Source: Ambulatory Visit | Attending: General Surgery | Admitting: General Surgery

## 2021-08-14 ENCOUNTER — Other Ambulatory Visit: Payer: Self-pay

## 2021-08-14 DIAGNOSIS — Z1231 Encounter for screening mammogram for malignant neoplasm of breast: Secondary | ICD-10-CM | POA: Insufficient documentation

## 2021-08-15 ENCOUNTER — Telehealth: Payer: Self-pay | Admitting: Family Medicine

## 2021-08-15 NOTE — Telephone Encounter (Signed)
Pts son called in regards to paperwork that was faxed over. Forms are for [facility]. Forms are currently in PCPs box. Pts son would like to get this started for pt. Pt is aware PCP is out of office as well as CMA. Pt gave understanding. Reymond Maynez,cma

## 2021-08-15 NOTE — Telephone Encounter (Signed)
Pts son called in regards to paperwork that was faxed over. Forms are for [facility]. Forms are currently in PCPs box. Pts son would like to get this started for pt. Pt is aware PCP is out of office as well as CMA. Pt gave understanding.

## 2021-08-19 DIAGNOSIS — R2689 Other abnormalities of gait and mobility: Secondary | ICD-10-CM | POA: Diagnosis not present

## 2021-08-19 DIAGNOSIS — Z9181 History of falling: Secondary | ICD-10-CM | POA: Diagnosis not present

## 2021-08-19 DIAGNOSIS — R2681 Unsteadiness on feet: Secondary | ICD-10-CM | POA: Diagnosis not present

## 2021-08-19 DIAGNOSIS — M6281 Muscle weakness (generalized): Secondary | ICD-10-CM | POA: Diagnosis not present

## 2021-08-19 DIAGNOSIS — N39498 Other specified urinary incontinence: Secondary | ICD-10-CM | POA: Diagnosis not present

## 2021-08-19 NOTE — Telephone Encounter (Signed)
Tabitha from [facility] called in stating that she is waiting on [facility] paperwork to be signed. Spoke with Gae Bon. Gae Bon advise that NP Dutch Quint will be in this afternoon. Advise Lawerance Bach of the situation. Lawerance Bach stated that she really need the paperwork signed by today. Lawerance Bach stated that Pt will be moving out of her apartment into the facility tomorrow.

## 2021-08-20 DIAGNOSIS — F325 Major depressive disorder, single episode, in full remission: Secondary | ICD-10-CM | POA: Diagnosis not present

## 2021-08-20 DIAGNOSIS — I4891 Unspecified atrial fibrillation: Secondary | ICD-10-CM | POA: Diagnosis not present

## 2021-08-20 DIAGNOSIS — F0390 Unspecified dementia without behavioral disturbance: Secondary | ICD-10-CM | POA: Diagnosis not present

## 2021-08-20 DIAGNOSIS — Z86711 Personal history of pulmonary embolism: Secondary | ICD-10-CM | POA: Diagnosis not present

## 2021-08-20 DIAGNOSIS — Z593 Problems related to living in residential institution: Secondary | ICD-10-CM | POA: Diagnosis not present

## 2021-08-20 NOTE — Telephone Encounter (Signed)
FL-2 was  signed by M. Arnette and faxed to [facility] attention to [place].  Confirmation given.  Garielle Mroz,cma

## 2021-08-21 DIAGNOSIS — F0390 Unspecified dementia without behavioral disturbance: Secondary | ICD-10-CM | POA: Diagnosis not present

## 2021-08-21 DIAGNOSIS — M5412 Radiculopathy, cervical region: Secondary | ICD-10-CM | POA: Diagnosis not present

## 2021-08-21 DIAGNOSIS — Z86711 Personal history of pulmonary embolism: Secondary | ICD-10-CM | POA: Diagnosis not present

## 2021-08-21 DIAGNOSIS — Z9181 History of falling: Secondary | ICD-10-CM | POA: Diagnosis not present

## 2021-08-21 DIAGNOSIS — J302 Other seasonal allergic rhinitis: Secondary | ICD-10-CM | POA: Diagnosis not present

## 2021-08-21 DIAGNOSIS — Z853 Personal history of malignant neoplasm of breast: Secondary | ICD-10-CM | POA: Diagnosis not present

## 2021-08-21 DIAGNOSIS — M6281 Muscle weakness (generalized): Secondary | ICD-10-CM | POA: Diagnosis not present

## 2021-08-21 DIAGNOSIS — G250 Essential tremor: Secondary | ICD-10-CM | POA: Diagnosis not present

## 2021-08-21 DIAGNOSIS — R350 Frequency of micturition: Secondary | ICD-10-CM | POA: Diagnosis not present

## 2021-08-21 DIAGNOSIS — F325 Major depressive disorder, single episode, in full remission: Secondary | ICD-10-CM | POA: Diagnosis not present

## 2021-08-21 DIAGNOSIS — R2681 Unsteadiness on feet: Secondary | ICD-10-CM | POA: Diagnosis not present

## 2021-08-21 DIAGNOSIS — Z593 Problems related to living in residential institution: Secondary | ICD-10-CM | POA: Diagnosis not present

## 2021-08-21 DIAGNOSIS — I4891 Unspecified atrial fibrillation: Secondary | ICD-10-CM | POA: Diagnosis not present

## 2021-08-21 DIAGNOSIS — I10 Essential (primary) hypertension: Secondary | ICD-10-CM | POA: Diagnosis not present

## 2021-08-21 DIAGNOSIS — R2689 Other abnormalities of gait and mobility: Secondary | ICD-10-CM | POA: Diagnosis not present

## 2021-08-22 DIAGNOSIS — R2681 Unsteadiness on feet: Secondary | ICD-10-CM | POA: Diagnosis not present

## 2021-08-22 DIAGNOSIS — M6281 Muscle weakness (generalized): Secondary | ICD-10-CM | POA: Diagnosis not present

## 2021-08-22 DIAGNOSIS — I10 Essential (primary) hypertension: Secondary | ICD-10-CM | POA: Diagnosis not present

## 2021-08-22 DIAGNOSIS — F325 Major depressive disorder, single episode, in full remission: Secondary | ICD-10-CM | POA: Diagnosis not present

## 2021-08-22 DIAGNOSIS — F0390 Unspecified dementia without behavioral disturbance: Secondary | ICD-10-CM | POA: Diagnosis not present

## 2021-08-22 DIAGNOSIS — I4891 Unspecified atrial fibrillation: Secondary | ICD-10-CM | POA: Diagnosis not present

## 2021-08-25 DIAGNOSIS — I10 Essential (primary) hypertension: Secondary | ICD-10-CM | POA: Diagnosis not present

## 2021-08-25 DIAGNOSIS — R2681 Unsteadiness on feet: Secondary | ICD-10-CM | POA: Diagnosis not present

## 2021-08-25 DIAGNOSIS — F325 Major depressive disorder, single episode, in full remission: Secondary | ICD-10-CM | POA: Diagnosis not present

## 2021-08-25 DIAGNOSIS — I4891 Unspecified atrial fibrillation: Secondary | ICD-10-CM | POA: Diagnosis not present

## 2021-08-25 DIAGNOSIS — M6281 Muscle weakness (generalized): Secondary | ICD-10-CM | POA: Diagnosis not present

## 2021-08-25 DIAGNOSIS — F0390 Unspecified dementia without behavioral disturbance: Secondary | ICD-10-CM | POA: Diagnosis not present

## 2021-08-26 DIAGNOSIS — F325 Major depressive disorder, single episode, in full remission: Secondary | ICD-10-CM | POA: Diagnosis not present

## 2021-08-26 DIAGNOSIS — I10 Essential (primary) hypertension: Secondary | ICD-10-CM | POA: Diagnosis not present

## 2021-08-26 DIAGNOSIS — F0390 Unspecified dementia without behavioral disturbance: Secondary | ICD-10-CM | POA: Diagnosis not present

## 2021-08-26 DIAGNOSIS — M6281 Muscle weakness (generalized): Secondary | ICD-10-CM | POA: Diagnosis not present

## 2021-08-26 DIAGNOSIS — I4891 Unspecified atrial fibrillation: Secondary | ICD-10-CM | POA: Diagnosis not present

## 2021-08-26 DIAGNOSIS — R2681 Unsteadiness on feet: Secondary | ICD-10-CM | POA: Diagnosis not present

## 2021-08-27 DIAGNOSIS — I4891 Unspecified atrial fibrillation: Secondary | ICD-10-CM | POA: Diagnosis not present

## 2021-08-27 DIAGNOSIS — R2689 Other abnormalities of gait and mobility: Secondary | ICD-10-CM | POA: Diagnosis not present

## 2021-08-27 DIAGNOSIS — Z853 Personal history of malignant neoplasm of breast: Secondary | ICD-10-CM | POA: Diagnosis not present

## 2021-08-27 DIAGNOSIS — M6281 Muscle weakness (generalized): Secondary | ICD-10-CM | POA: Diagnosis not present

## 2021-08-27 DIAGNOSIS — M5412 Radiculopathy, cervical region: Secondary | ICD-10-CM | POA: Diagnosis not present

## 2021-08-27 DIAGNOSIS — Z593 Problems related to living in residential institution: Secondary | ICD-10-CM | POA: Diagnosis not present

## 2021-08-27 DIAGNOSIS — Z9181 History of falling: Secondary | ICD-10-CM | POA: Diagnosis not present

## 2021-08-27 DIAGNOSIS — R350 Frequency of micturition: Secondary | ICD-10-CM | POA: Diagnosis not present

## 2021-08-27 DIAGNOSIS — I10 Essential (primary) hypertension: Secondary | ICD-10-CM | POA: Diagnosis not present

## 2021-08-27 DIAGNOSIS — R2681 Unsteadiness on feet: Secondary | ICD-10-CM | POA: Diagnosis not present

## 2021-08-27 DIAGNOSIS — F325 Major depressive disorder, single episode, in full remission: Secondary | ICD-10-CM | POA: Diagnosis not present

## 2021-08-27 DIAGNOSIS — J302 Other seasonal allergic rhinitis: Secondary | ICD-10-CM | POA: Diagnosis not present

## 2021-08-27 DIAGNOSIS — F0390 Unspecified dementia without behavioral disturbance: Secondary | ICD-10-CM | POA: Diagnosis not present

## 2021-08-27 DIAGNOSIS — Z86711 Personal history of pulmonary embolism: Secondary | ICD-10-CM | POA: Diagnosis not present

## 2021-08-27 DIAGNOSIS — R41841 Cognitive communication deficit: Secondary | ICD-10-CM | POA: Diagnosis not present

## 2021-08-27 DIAGNOSIS — G250 Essential tremor: Secondary | ICD-10-CM | POA: Diagnosis not present

## 2021-08-28 DIAGNOSIS — Z9181 History of falling: Secondary | ICD-10-CM | POA: Diagnosis not present

## 2021-08-28 DIAGNOSIS — M6281 Muscle weakness (generalized): Secondary | ICD-10-CM | POA: Diagnosis not present

## 2021-08-28 DIAGNOSIS — I4891 Unspecified atrial fibrillation: Secondary | ICD-10-CM | POA: Diagnosis not present

## 2021-08-28 DIAGNOSIS — F0390 Unspecified dementia without behavioral disturbance: Secondary | ICD-10-CM | POA: Diagnosis not present

## 2021-08-28 DIAGNOSIS — R2681 Unsteadiness on feet: Secondary | ICD-10-CM | POA: Diagnosis not present

## 2021-08-28 DIAGNOSIS — F325 Major depressive disorder, single episode, in full remission: Secondary | ICD-10-CM | POA: Diagnosis not present

## 2021-08-29 DIAGNOSIS — R2681 Unsteadiness on feet: Secondary | ICD-10-CM | POA: Diagnosis not present

## 2021-08-29 DIAGNOSIS — F0390 Unspecified dementia without behavioral disturbance: Secondary | ICD-10-CM | POA: Diagnosis not present

## 2021-08-29 DIAGNOSIS — I4891 Unspecified atrial fibrillation: Secondary | ICD-10-CM | POA: Diagnosis not present

## 2021-08-29 DIAGNOSIS — F325 Major depressive disorder, single episode, in full remission: Secondary | ICD-10-CM | POA: Diagnosis not present

## 2021-08-29 DIAGNOSIS — M6281 Muscle weakness (generalized): Secondary | ICD-10-CM | POA: Diagnosis not present

## 2021-08-29 DIAGNOSIS — Z9181 History of falling: Secondary | ICD-10-CM | POA: Diagnosis not present

## 2021-09-01 DIAGNOSIS — Z9181 History of falling: Secondary | ICD-10-CM | POA: Diagnosis not present

## 2021-09-01 DIAGNOSIS — F0390 Unspecified dementia without behavioral disturbance: Secondary | ICD-10-CM | POA: Diagnosis not present

## 2021-09-01 DIAGNOSIS — M6281 Muscle weakness (generalized): Secondary | ICD-10-CM | POA: Diagnosis not present

## 2021-09-01 DIAGNOSIS — F325 Major depressive disorder, single episode, in full remission: Secondary | ICD-10-CM | POA: Diagnosis not present

## 2021-09-01 DIAGNOSIS — I4891 Unspecified atrial fibrillation: Secondary | ICD-10-CM | POA: Diagnosis not present

## 2021-09-01 DIAGNOSIS — R2681 Unsteadiness on feet: Secondary | ICD-10-CM | POA: Diagnosis not present

## 2021-09-02 DIAGNOSIS — M6281 Muscle weakness (generalized): Secondary | ICD-10-CM | POA: Diagnosis not present

## 2021-09-02 DIAGNOSIS — Z9181 History of falling: Secondary | ICD-10-CM | POA: Diagnosis not present

## 2021-09-02 DIAGNOSIS — R2681 Unsteadiness on feet: Secondary | ICD-10-CM | POA: Diagnosis not present

## 2021-09-02 DIAGNOSIS — F325 Major depressive disorder, single episode, in full remission: Secondary | ICD-10-CM | POA: Diagnosis not present

## 2021-09-02 DIAGNOSIS — F0390 Unspecified dementia without behavioral disturbance: Secondary | ICD-10-CM | POA: Diagnosis not present

## 2021-09-02 DIAGNOSIS — I4891 Unspecified atrial fibrillation: Secondary | ICD-10-CM | POA: Diagnosis not present

## 2021-09-03 DIAGNOSIS — I4891 Unspecified atrial fibrillation: Secondary | ICD-10-CM | POA: Diagnosis not present

## 2021-09-03 DIAGNOSIS — B351 Tinea unguium: Secondary | ICD-10-CM | POA: Diagnosis not present

## 2021-09-03 DIAGNOSIS — F325 Major depressive disorder, single episode, in full remission: Secondary | ICD-10-CM | POA: Diagnosis not present

## 2021-09-03 DIAGNOSIS — M6281 Muscle weakness (generalized): Secondary | ICD-10-CM | POA: Diagnosis not present

## 2021-09-03 DIAGNOSIS — I7091 Generalized atherosclerosis: Secondary | ICD-10-CM | POA: Diagnosis not present

## 2021-09-03 DIAGNOSIS — Z9181 History of falling: Secondary | ICD-10-CM | POA: Diagnosis not present

## 2021-09-03 DIAGNOSIS — L603 Nail dystrophy: Secondary | ICD-10-CM | POA: Diagnosis not present

## 2021-09-03 DIAGNOSIS — M2041 Other hammer toe(s) (acquired), right foot: Secondary | ICD-10-CM | POA: Diagnosis not present

## 2021-09-03 DIAGNOSIS — F0390 Unspecified dementia without behavioral disturbance: Secondary | ICD-10-CM | POA: Diagnosis not present

## 2021-09-03 DIAGNOSIS — M2042 Other hammer toe(s) (acquired), left foot: Secondary | ICD-10-CM | POA: Diagnosis not present

## 2021-09-03 DIAGNOSIS — R2681 Unsteadiness on feet: Secondary | ICD-10-CM | POA: Diagnosis not present

## 2021-09-04 DIAGNOSIS — F325 Major depressive disorder, single episode, in full remission: Secondary | ICD-10-CM | POA: Diagnosis not present

## 2021-09-04 DIAGNOSIS — M6281 Muscle weakness (generalized): Secondary | ICD-10-CM | POA: Diagnosis not present

## 2021-09-04 DIAGNOSIS — Z9181 History of falling: Secondary | ICD-10-CM | POA: Diagnosis not present

## 2021-09-04 DIAGNOSIS — R2681 Unsteadiness on feet: Secondary | ICD-10-CM | POA: Diagnosis not present

## 2021-09-04 DIAGNOSIS — I4891 Unspecified atrial fibrillation: Secondary | ICD-10-CM | POA: Diagnosis not present

## 2021-09-04 DIAGNOSIS — F0390 Unspecified dementia without behavioral disturbance: Secondary | ICD-10-CM | POA: Diagnosis not present

## 2021-09-05 DIAGNOSIS — I4891 Unspecified atrial fibrillation: Secondary | ICD-10-CM | POA: Diagnosis not present

## 2021-09-05 DIAGNOSIS — F325 Major depressive disorder, single episode, in full remission: Secondary | ICD-10-CM | POA: Diagnosis not present

## 2021-09-05 DIAGNOSIS — M6281 Muscle weakness (generalized): Secondary | ICD-10-CM | POA: Diagnosis not present

## 2021-09-05 DIAGNOSIS — Z9181 History of falling: Secondary | ICD-10-CM | POA: Diagnosis not present

## 2021-09-05 DIAGNOSIS — F0390 Unspecified dementia without behavioral disturbance: Secondary | ICD-10-CM | POA: Diagnosis not present

## 2021-09-05 DIAGNOSIS — R2681 Unsteadiness on feet: Secondary | ICD-10-CM | POA: Diagnosis not present

## 2021-09-08 DIAGNOSIS — F325 Major depressive disorder, single episode, in full remission: Secondary | ICD-10-CM | POA: Diagnosis not present

## 2021-09-08 DIAGNOSIS — R2681 Unsteadiness on feet: Secondary | ICD-10-CM | POA: Diagnosis not present

## 2021-09-08 DIAGNOSIS — I4891 Unspecified atrial fibrillation: Secondary | ICD-10-CM | POA: Diagnosis not present

## 2021-09-08 DIAGNOSIS — F0390 Unspecified dementia without behavioral disturbance: Secondary | ICD-10-CM | POA: Diagnosis not present

## 2021-09-08 DIAGNOSIS — M6281 Muscle weakness (generalized): Secondary | ICD-10-CM | POA: Diagnosis not present

## 2021-09-08 DIAGNOSIS — Z9181 History of falling: Secondary | ICD-10-CM | POA: Diagnosis not present

## 2021-09-09 DIAGNOSIS — R2681 Unsteadiness on feet: Secondary | ICD-10-CM | POA: Diagnosis not present

## 2021-09-09 DIAGNOSIS — M6281 Muscle weakness (generalized): Secondary | ICD-10-CM | POA: Diagnosis not present

## 2021-09-09 DIAGNOSIS — I4891 Unspecified atrial fibrillation: Secondary | ICD-10-CM | POA: Diagnosis not present

## 2021-09-09 DIAGNOSIS — Z9181 History of falling: Secondary | ICD-10-CM | POA: Diagnosis not present

## 2021-09-09 DIAGNOSIS — F0390 Unspecified dementia without behavioral disturbance: Secondary | ICD-10-CM | POA: Diagnosis not present

## 2021-09-09 DIAGNOSIS — F325 Major depressive disorder, single episode, in full remission: Secondary | ICD-10-CM | POA: Diagnosis not present

## 2021-09-10 DIAGNOSIS — F325 Major depressive disorder, single episode, in full remission: Secondary | ICD-10-CM | POA: Diagnosis not present

## 2021-09-10 DIAGNOSIS — I4891 Unspecified atrial fibrillation: Secondary | ICD-10-CM | POA: Diagnosis not present

## 2021-09-10 DIAGNOSIS — R2681 Unsteadiness on feet: Secondary | ICD-10-CM | POA: Diagnosis not present

## 2021-09-10 DIAGNOSIS — F0390 Unspecified dementia without behavioral disturbance: Secondary | ICD-10-CM | POA: Diagnosis not present

## 2021-09-10 DIAGNOSIS — M6281 Muscle weakness (generalized): Secondary | ICD-10-CM | POA: Diagnosis not present

## 2021-09-10 DIAGNOSIS — Z9181 History of falling: Secondary | ICD-10-CM | POA: Diagnosis not present

## 2021-09-11 DIAGNOSIS — R2681 Unsteadiness on feet: Secondary | ICD-10-CM | POA: Diagnosis not present

## 2021-09-11 DIAGNOSIS — Z9181 History of falling: Secondary | ICD-10-CM | POA: Diagnosis not present

## 2021-09-11 DIAGNOSIS — F0390 Unspecified dementia without behavioral disturbance: Secondary | ICD-10-CM | POA: Diagnosis not present

## 2021-09-11 DIAGNOSIS — M6281 Muscle weakness (generalized): Secondary | ICD-10-CM | POA: Diagnosis not present

## 2021-09-11 DIAGNOSIS — F325 Major depressive disorder, single episode, in full remission: Secondary | ICD-10-CM | POA: Diagnosis not present

## 2021-09-11 DIAGNOSIS — I4891 Unspecified atrial fibrillation: Secondary | ICD-10-CM | POA: Diagnosis not present

## 2021-09-12 DIAGNOSIS — Z9181 History of falling: Secondary | ICD-10-CM | POA: Diagnosis not present

## 2021-09-12 DIAGNOSIS — I4891 Unspecified atrial fibrillation: Secondary | ICD-10-CM | POA: Diagnosis not present

## 2021-09-12 DIAGNOSIS — M6281 Muscle weakness (generalized): Secondary | ICD-10-CM | POA: Diagnosis not present

## 2021-09-12 DIAGNOSIS — F0390 Unspecified dementia without behavioral disturbance: Secondary | ICD-10-CM | POA: Diagnosis not present

## 2021-09-12 DIAGNOSIS — R2681 Unsteadiness on feet: Secondary | ICD-10-CM | POA: Diagnosis not present

## 2021-09-12 DIAGNOSIS — F325 Major depressive disorder, single episode, in full remission: Secondary | ICD-10-CM | POA: Diagnosis not present

## 2021-09-15 DIAGNOSIS — I4891 Unspecified atrial fibrillation: Secondary | ICD-10-CM | POA: Diagnosis not present

## 2021-09-15 DIAGNOSIS — F325 Major depressive disorder, single episode, in full remission: Secondary | ICD-10-CM | POA: Diagnosis not present

## 2021-09-15 DIAGNOSIS — F0390 Unspecified dementia without behavioral disturbance: Secondary | ICD-10-CM | POA: Diagnosis not present

## 2021-09-15 DIAGNOSIS — Z9181 History of falling: Secondary | ICD-10-CM | POA: Diagnosis not present

## 2021-09-15 DIAGNOSIS — R2681 Unsteadiness on feet: Secondary | ICD-10-CM | POA: Diagnosis not present

## 2021-09-15 DIAGNOSIS — M6281 Muscle weakness (generalized): Secondary | ICD-10-CM | POA: Diagnosis not present

## 2021-09-16 DIAGNOSIS — Z9181 History of falling: Secondary | ICD-10-CM | POA: Diagnosis not present

## 2021-09-16 DIAGNOSIS — M6281 Muscle weakness (generalized): Secondary | ICD-10-CM | POA: Diagnosis not present

## 2021-09-16 DIAGNOSIS — R2681 Unsteadiness on feet: Secondary | ICD-10-CM | POA: Diagnosis not present

## 2021-09-16 DIAGNOSIS — F0390 Unspecified dementia without behavioral disturbance: Secondary | ICD-10-CM | POA: Diagnosis not present

## 2021-09-16 DIAGNOSIS — I4891 Unspecified atrial fibrillation: Secondary | ICD-10-CM | POA: Diagnosis not present

## 2021-09-16 DIAGNOSIS — F325 Major depressive disorder, single episode, in full remission: Secondary | ICD-10-CM | POA: Diagnosis not present

## 2021-09-17 DIAGNOSIS — Z9181 History of falling: Secondary | ICD-10-CM | POA: Diagnosis not present

## 2021-09-17 DIAGNOSIS — F325 Major depressive disorder, single episode, in full remission: Secondary | ICD-10-CM | POA: Diagnosis not present

## 2021-09-17 DIAGNOSIS — R2681 Unsteadiness on feet: Secondary | ICD-10-CM | POA: Diagnosis not present

## 2021-09-17 DIAGNOSIS — M6281 Muscle weakness (generalized): Secondary | ICD-10-CM | POA: Diagnosis not present

## 2021-09-17 DIAGNOSIS — F0390 Unspecified dementia without behavioral disturbance: Secondary | ICD-10-CM | POA: Diagnosis not present

## 2021-09-17 DIAGNOSIS — I4891 Unspecified atrial fibrillation: Secondary | ICD-10-CM | POA: Diagnosis not present

## 2021-09-18 DIAGNOSIS — M6281 Muscle weakness (generalized): Secondary | ICD-10-CM | POA: Diagnosis not present

## 2021-09-18 DIAGNOSIS — F0390 Unspecified dementia without behavioral disturbance: Secondary | ICD-10-CM | POA: Diagnosis not present

## 2021-09-18 DIAGNOSIS — F325 Major depressive disorder, single episode, in full remission: Secondary | ICD-10-CM | POA: Diagnosis not present

## 2021-09-18 DIAGNOSIS — Z9181 History of falling: Secondary | ICD-10-CM | POA: Diagnosis not present

## 2021-09-18 DIAGNOSIS — I4891 Unspecified atrial fibrillation: Secondary | ICD-10-CM | POA: Diagnosis not present

## 2021-09-18 DIAGNOSIS — R2681 Unsteadiness on feet: Secondary | ICD-10-CM | POA: Diagnosis not present

## 2021-09-19 DIAGNOSIS — F0390 Unspecified dementia without behavioral disturbance: Secondary | ICD-10-CM | POA: Diagnosis not present

## 2021-09-19 DIAGNOSIS — M6281 Muscle weakness (generalized): Secondary | ICD-10-CM | POA: Diagnosis not present

## 2021-09-19 DIAGNOSIS — Z9181 History of falling: Secondary | ICD-10-CM | POA: Diagnosis not present

## 2021-09-19 DIAGNOSIS — R2681 Unsteadiness on feet: Secondary | ICD-10-CM | POA: Diagnosis not present

## 2021-09-19 DIAGNOSIS — I4891 Unspecified atrial fibrillation: Secondary | ICD-10-CM | POA: Diagnosis not present

## 2021-09-19 DIAGNOSIS — F325 Major depressive disorder, single episode, in full remission: Secondary | ICD-10-CM | POA: Diagnosis not present

## 2021-09-22 DIAGNOSIS — F0390 Unspecified dementia without behavioral disturbance: Secondary | ICD-10-CM | POA: Diagnosis not present

## 2021-09-22 DIAGNOSIS — M6281 Muscle weakness (generalized): Secondary | ICD-10-CM | POA: Diagnosis not present

## 2021-09-22 DIAGNOSIS — Z9181 History of falling: Secondary | ICD-10-CM | POA: Diagnosis not present

## 2021-09-22 DIAGNOSIS — F325 Major depressive disorder, single episode, in full remission: Secondary | ICD-10-CM | POA: Diagnosis not present

## 2021-09-22 DIAGNOSIS — R2681 Unsteadiness on feet: Secondary | ICD-10-CM | POA: Diagnosis not present

## 2021-09-22 DIAGNOSIS — I4891 Unspecified atrial fibrillation: Secondary | ICD-10-CM | POA: Diagnosis not present

## 2021-09-23 DIAGNOSIS — I4891 Unspecified atrial fibrillation: Secondary | ICD-10-CM | POA: Diagnosis not present

## 2021-09-23 DIAGNOSIS — F0390 Unspecified dementia without behavioral disturbance: Secondary | ICD-10-CM | POA: Diagnosis not present

## 2021-09-23 DIAGNOSIS — F325 Major depressive disorder, single episode, in full remission: Secondary | ICD-10-CM | POA: Diagnosis not present

## 2021-09-23 DIAGNOSIS — M6281 Muscle weakness (generalized): Secondary | ICD-10-CM | POA: Diagnosis not present

## 2021-09-23 DIAGNOSIS — Z9181 History of falling: Secondary | ICD-10-CM | POA: Diagnosis not present

## 2021-09-23 DIAGNOSIS — R2681 Unsteadiness on feet: Secondary | ICD-10-CM | POA: Diagnosis not present

## 2021-09-24 DIAGNOSIS — R2681 Unsteadiness on feet: Secondary | ICD-10-CM | POA: Diagnosis not present

## 2021-09-24 DIAGNOSIS — R2689 Other abnormalities of gait and mobility: Secondary | ICD-10-CM | POA: Diagnosis not present

## 2021-09-24 DIAGNOSIS — Z593 Problems related to living in residential institution: Secondary | ICD-10-CM | POA: Diagnosis not present

## 2021-09-24 DIAGNOSIS — M5412 Radiculopathy, cervical region: Secondary | ICD-10-CM | POA: Diagnosis not present

## 2021-09-24 DIAGNOSIS — I4891 Unspecified atrial fibrillation: Secondary | ICD-10-CM | POA: Diagnosis not present

## 2021-09-24 DIAGNOSIS — G250 Essential tremor: Secondary | ICD-10-CM | POA: Diagnosis not present

## 2021-09-24 DIAGNOSIS — Z853 Personal history of malignant neoplasm of breast: Secondary | ICD-10-CM | POA: Diagnosis not present

## 2021-09-24 DIAGNOSIS — R350 Frequency of micturition: Secondary | ICD-10-CM | POA: Diagnosis not present

## 2021-09-24 DIAGNOSIS — I10 Essential (primary) hypertension: Secondary | ICD-10-CM | POA: Diagnosis not present

## 2021-09-24 DIAGNOSIS — J302 Other seasonal allergic rhinitis: Secondary | ICD-10-CM | POA: Diagnosis not present

## 2021-09-24 DIAGNOSIS — Z9181 History of falling: Secondary | ICD-10-CM | POA: Diagnosis not present

## 2021-09-24 DIAGNOSIS — F325 Major depressive disorder, single episode, in full remission: Secondary | ICD-10-CM | POA: Diagnosis not present

## 2021-09-24 DIAGNOSIS — R41841 Cognitive communication deficit: Secondary | ICD-10-CM | POA: Diagnosis not present

## 2021-09-24 DIAGNOSIS — Z86711 Personal history of pulmonary embolism: Secondary | ICD-10-CM | POA: Diagnosis not present

## 2021-09-24 DIAGNOSIS — F0390 Unspecified dementia without behavioral disturbance: Secondary | ICD-10-CM | POA: Diagnosis not present

## 2021-09-24 DIAGNOSIS — M6281 Muscle weakness (generalized): Secondary | ICD-10-CM | POA: Diagnosis not present

## 2021-09-25 DIAGNOSIS — I4891 Unspecified atrial fibrillation: Secondary | ICD-10-CM | POA: Diagnosis not present

## 2021-09-25 DIAGNOSIS — F0390 Unspecified dementia without behavioral disturbance: Secondary | ICD-10-CM | POA: Diagnosis not present

## 2021-09-25 DIAGNOSIS — M6281 Muscle weakness (generalized): Secondary | ICD-10-CM | POA: Diagnosis not present

## 2021-09-25 DIAGNOSIS — R41841 Cognitive communication deficit: Secondary | ICD-10-CM | POA: Diagnosis not present

## 2021-09-25 DIAGNOSIS — F325 Major depressive disorder, single episode, in full remission: Secondary | ICD-10-CM | POA: Diagnosis not present

## 2021-09-25 DIAGNOSIS — R2681 Unsteadiness on feet: Secondary | ICD-10-CM | POA: Diagnosis not present

## 2021-09-26 DIAGNOSIS — R2681 Unsteadiness on feet: Secondary | ICD-10-CM | POA: Diagnosis not present

## 2021-09-26 DIAGNOSIS — F325 Major depressive disorder, single episode, in full remission: Secondary | ICD-10-CM | POA: Diagnosis not present

## 2021-09-26 DIAGNOSIS — M6281 Muscle weakness (generalized): Secondary | ICD-10-CM | POA: Diagnosis not present

## 2021-09-26 DIAGNOSIS — F0390 Unspecified dementia without behavioral disturbance: Secondary | ICD-10-CM | POA: Diagnosis not present

## 2021-09-26 DIAGNOSIS — R41841 Cognitive communication deficit: Secondary | ICD-10-CM | POA: Diagnosis not present

## 2021-09-26 DIAGNOSIS — I4891 Unspecified atrial fibrillation: Secondary | ICD-10-CM | POA: Diagnosis not present

## 2021-09-29 DIAGNOSIS — R41841 Cognitive communication deficit: Secondary | ICD-10-CM | POA: Diagnosis not present

## 2021-09-29 DIAGNOSIS — M6281 Muscle weakness (generalized): Secondary | ICD-10-CM | POA: Diagnosis not present

## 2021-09-29 DIAGNOSIS — I4891 Unspecified atrial fibrillation: Secondary | ICD-10-CM | POA: Diagnosis not present

## 2021-09-29 DIAGNOSIS — F325 Major depressive disorder, single episode, in full remission: Secondary | ICD-10-CM | POA: Diagnosis not present

## 2021-09-29 DIAGNOSIS — R2681 Unsteadiness on feet: Secondary | ICD-10-CM | POA: Diagnosis not present

## 2021-09-29 DIAGNOSIS — F0390 Unspecified dementia without behavioral disturbance: Secondary | ICD-10-CM | POA: Diagnosis not present

## 2021-09-30 DIAGNOSIS — R2681 Unsteadiness on feet: Secondary | ICD-10-CM | POA: Diagnosis not present

## 2021-09-30 DIAGNOSIS — M6281 Muscle weakness (generalized): Secondary | ICD-10-CM | POA: Diagnosis not present

## 2021-09-30 DIAGNOSIS — I4891 Unspecified atrial fibrillation: Secondary | ICD-10-CM | POA: Diagnosis not present

## 2021-09-30 DIAGNOSIS — F325 Major depressive disorder, single episode, in full remission: Secondary | ICD-10-CM | POA: Diagnosis not present

## 2021-09-30 DIAGNOSIS — R41841 Cognitive communication deficit: Secondary | ICD-10-CM | POA: Diagnosis not present

## 2021-09-30 DIAGNOSIS — F0390 Unspecified dementia without behavioral disturbance: Secondary | ICD-10-CM | POA: Diagnosis not present

## 2021-10-01 DIAGNOSIS — R2681 Unsteadiness on feet: Secondary | ICD-10-CM | POA: Diagnosis not present

## 2021-10-01 DIAGNOSIS — I4891 Unspecified atrial fibrillation: Secondary | ICD-10-CM | POA: Diagnosis not present

## 2021-10-01 DIAGNOSIS — R41841 Cognitive communication deficit: Secondary | ICD-10-CM | POA: Diagnosis not present

## 2021-10-01 DIAGNOSIS — M6281 Muscle weakness (generalized): Secondary | ICD-10-CM | POA: Diagnosis not present

## 2021-10-01 DIAGNOSIS — F325 Major depressive disorder, single episode, in full remission: Secondary | ICD-10-CM | POA: Diagnosis not present

## 2021-10-01 DIAGNOSIS — F0390 Unspecified dementia without behavioral disturbance: Secondary | ICD-10-CM | POA: Diagnosis not present

## 2021-10-02 DIAGNOSIS — I4891 Unspecified atrial fibrillation: Secondary | ICD-10-CM | POA: Diagnosis not present

## 2021-10-02 DIAGNOSIS — R2681 Unsteadiness on feet: Secondary | ICD-10-CM | POA: Diagnosis not present

## 2021-10-02 DIAGNOSIS — F0390 Unspecified dementia without behavioral disturbance: Secondary | ICD-10-CM | POA: Diagnosis not present

## 2021-10-02 DIAGNOSIS — F325 Major depressive disorder, single episode, in full remission: Secondary | ICD-10-CM | POA: Diagnosis not present

## 2021-10-02 DIAGNOSIS — R41841 Cognitive communication deficit: Secondary | ICD-10-CM | POA: Diagnosis not present

## 2021-10-02 DIAGNOSIS — M6281 Muscle weakness (generalized): Secondary | ICD-10-CM | POA: Diagnosis not present

## 2021-10-03 DIAGNOSIS — M6281 Muscle weakness (generalized): Secondary | ICD-10-CM | POA: Diagnosis not present

## 2021-10-03 DIAGNOSIS — I4891 Unspecified atrial fibrillation: Secondary | ICD-10-CM | POA: Diagnosis not present

## 2021-10-03 DIAGNOSIS — R41841 Cognitive communication deficit: Secondary | ICD-10-CM | POA: Diagnosis not present

## 2021-10-03 DIAGNOSIS — F0390 Unspecified dementia without behavioral disturbance: Secondary | ICD-10-CM | POA: Diagnosis not present

## 2021-10-03 DIAGNOSIS — F325 Major depressive disorder, single episode, in full remission: Secondary | ICD-10-CM | POA: Diagnosis not present

## 2021-10-03 DIAGNOSIS — R2681 Unsteadiness on feet: Secondary | ICD-10-CM | POA: Diagnosis not present

## 2021-10-06 DIAGNOSIS — M6281 Muscle weakness (generalized): Secondary | ICD-10-CM | POA: Diagnosis not present

## 2021-10-06 DIAGNOSIS — R41841 Cognitive communication deficit: Secondary | ICD-10-CM | POA: Diagnosis not present

## 2021-10-06 DIAGNOSIS — F325 Major depressive disorder, single episode, in full remission: Secondary | ICD-10-CM | POA: Diagnosis not present

## 2021-10-06 DIAGNOSIS — R2681 Unsteadiness on feet: Secondary | ICD-10-CM | POA: Diagnosis not present

## 2021-10-06 DIAGNOSIS — I4891 Unspecified atrial fibrillation: Secondary | ICD-10-CM | POA: Diagnosis not present

## 2021-10-06 DIAGNOSIS — F0390 Unspecified dementia without behavioral disturbance: Secondary | ICD-10-CM | POA: Diagnosis not present

## 2021-10-07 ENCOUNTER — Ambulatory Visit: Payer: Medicare Other | Admitting: Family Medicine

## 2021-10-07 DIAGNOSIS — R2681 Unsteadiness on feet: Secondary | ICD-10-CM | POA: Diagnosis not present

## 2021-10-07 DIAGNOSIS — R41841 Cognitive communication deficit: Secondary | ICD-10-CM | POA: Diagnosis not present

## 2021-10-07 DIAGNOSIS — I4891 Unspecified atrial fibrillation: Secondary | ICD-10-CM | POA: Diagnosis not present

## 2021-10-07 DIAGNOSIS — F325 Major depressive disorder, single episode, in full remission: Secondary | ICD-10-CM | POA: Diagnosis not present

## 2021-10-07 DIAGNOSIS — F0390 Unspecified dementia without behavioral disturbance: Secondary | ICD-10-CM | POA: Diagnosis not present

## 2021-10-07 DIAGNOSIS — M6281 Muscle weakness (generalized): Secondary | ICD-10-CM | POA: Diagnosis not present

## 2021-10-08 DIAGNOSIS — F325 Major depressive disorder, single episode, in full remission: Secondary | ICD-10-CM | POA: Diagnosis not present

## 2021-10-08 DIAGNOSIS — R41841 Cognitive communication deficit: Secondary | ICD-10-CM | POA: Diagnosis not present

## 2021-10-08 DIAGNOSIS — R2681 Unsteadiness on feet: Secondary | ICD-10-CM | POA: Diagnosis not present

## 2021-10-08 DIAGNOSIS — I4891 Unspecified atrial fibrillation: Secondary | ICD-10-CM | POA: Diagnosis not present

## 2021-10-08 DIAGNOSIS — F0390 Unspecified dementia without behavioral disturbance: Secondary | ICD-10-CM | POA: Diagnosis not present

## 2021-10-08 DIAGNOSIS — M6281 Muscle weakness (generalized): Secondary | ICD-10-CM | POA: Diagnosis not present

## 2021-10-09 DIAGNOSIS — I4891 Unspecified atrial fibrillation: Secondary | ICD-10-CM | POA: Diagnosis not present

## 2021-10-09 DIAGNOSIS — F0390 Unspecified dementia without behavioral disturbance: Secondary | ICD-10-CM | POA: Diagnosis not present

## 2021-10-09 DIAGNOSIS — M6281 Muscle weakness (generalized): Secondary | ICD-10-CM | POA: Diagnosis not present

## 2021-10-09 DIAGNOSIS — R41841 Cognitive communication deficit: Secondary | ICD-10-CM | POA: Diagnosis not present

## 2021-10-09 DIAGNOSIS — F325 Major depressive disorder, single episode, in full remission: Secondary | ICD-10-CM | POA: Diagnosis not present

## 2021-10-09 DIAGNOSIS — R2681 Unsteadiness on feet: Secondary | ICD-10-CM | POA: Diagnosis not present

## 2021-10-10 DIAGNOSIS — R41841 Cognitive communication deficit: Secondary | ICD-10-CM | POA: Diagnosis not present

## 2021-10-10 DIAGNOSIS — F0390 Unspecified dementia without behavioral disturbance: Secondary | ICD-10-CM | POA: Diagnosis not present

## 2021-10-10 DIAGNOSIS — F325 Major depressive disorder, single episode, in full remission: Secondary | ICD-10-CM | POA: Diagnosis not present

## 2021-10-10 DIAGNOSIS — I4891 Unspecified atrial fibrillation: Secondary | ICD-10-CM | POA: Diagnosis not present

## 2021-10-10 DIAGNOSIS — M6281 Muscle weakness (generalized): Secondary | ICD-10-CM | POA: Diagnosis not present

## 2021-10-10 DIAGNOSIS — R2681 Unsteadiness on feet: Secondary | ICD-10-CM | POA: Diagnosis not present

## 2021-10-13 DIAGNOSIS — I4891 Unspecified atrial fibrillation: Secondary | ICD-10-CM | POA: Diagnosis not present

## 2021-10-13 DIAGNOSIS — F0390 Unspecified dementia without behavioral disturbance: Secondary | ICD-10-CM | POA: Diagnosis not present

## 2021-10-13 DIAGNOSIS — F325 Major depressive disorder, single episode, in full remission: Secondary | ICD-10-CM | POA: Diagnosis not present

## 2021-10-13 DIAGNOSIS — R2681 Unsteadiness on feet: Secondary | ICD-10-CM | POA: Diagnosis not present

## 2021-10-13 DIAGNOSIS — R41841 Cognitive communication deficit: Secondary | ICD-10-CM | POA: Diagnosis not present

## 2021-10-13 DIAGNOSIS — M6281 Muscle weakness (generalized): Secondary | ICD-10-CM | POA: Diagnosis not present

## 2021-10-14 DIAGNOSIS — R41841 Cognitive communication deficit: Secondary | ICD-10-CM | POA: Diagnosis not present

## 2021-10-14 DIAGNOSIS — I4891 Unspecified atrial fibrillation: Secondary | ICD-10-CM | POA: Diagnosis not present

## 2021-10-14 DIAGNOSIS — F0390 Unspecified dementia without behavioral disturbance: Secondary | ICD-10-CM | POA: Diagnosis not present

## 2021-10-14 DIAGNOSIS — R2681 Unsteadiness on feet: Secondary | ICD-10-CM | POA: Diagnosis not present

## 2021-10-14 DIAGNOSIS — M6281 Muscle weakness (generalized): Secondary | ICD-10-CM | POA: Diagnosis not present

## 2021-10-14 DIAGNOSIS — F325 Major depressive disorder, single episode, in full remission: Secondary | ICD-10-CM | POA: Diagnosis not present

## 2021-10-15 DIAGNOSIS — R2681 Unsteadiness on feet: Secondary | ICD-10-CM | POA: Diagnosis not present

## 2021-10-15 DIAGNOSIS — F0390 Unspecified dementia without behavioral disturbance: Secondary | ICD-10-CM | POA: Diagnosis not present

## 2021-10-15 DIAGNOSIS — R41841 Cognitive communication deficit: Secondary | ICD-10-CM | POA: Diagnosis not present

## 2021-10-15 DIAGNOSIS — I4891 Unspecified atrial fibrillation: Secondary | ICD-10-CM | POA: Diagnosis not present

## 2021-10-15 DIAGNOSIS — M6281 Muscle weakness (generalized): Secondary | ICD-10-CM | POA: Diagnosis not present

## 2021-10-15 DIAGNOSIS — F325 Major depressive disorder, single episode, in full remission: Secondary | ICD-10-CM | POA: Diagnosis not present

## 2021-10-16 DIAGNOSIS — R41841 Cognitive communication deficit: Secondary | ICD-10-CM | POA: Diagnosis not present

## 2021-10-16 DIAGNOSIS — F0390 Unspecified dementia without behavioral disturbance: Secondary | ICD-10-CM | POA: Diagnosis not present

## 2021-10-16 DIAGNOSIS — F325 Major depressive disorder, single episode, in full remission: Secondary | ICD-10-CM | POA: Diagnosis not present

## 2021-10-16 DIAGNOSIS — M6281 Muscle weakness (generalized): Secondary | ICD-10-CM | POA: Diagnosis not present

## 2021-10-16 DIAGNOSIS — I4891 Unspecified atrial fibrillation: Secondary | ICD-10-CM | POA: Diagnosis not present

## 2021-10-16 DIAGNOSIS — R2681 Unsteadiness on feet: Secondary | ICD-10-CM | POA: Diagnosis not present

## 2021-10-17 DIAGNOSIS — F325 Major depressive disorder, single episode, in full remission: Secondary | ICD-10-CM | POA: Diagnosis not present

## 2021-10-17 DIAGNOSIS — R41841 Cognitive communication deficit: Secondary | ICD-10-CM | POA: Diagnosis not present

## 2021-10-17 DIAGNOSIS — F0390 Unspecified dementia without behavioral disturbance: Secondary | ICD-10-CM | POA: Diagnosis not present

## 2021-10-17 DIAGNOSIS — I4891 Unspecified atrial fibrillation: Secondary | ICD-10-CM | POA: Diagnosis not present

## 2021-10-17 DIAGNOSIS — M6281 Muscle weakness (generalized): Secondary | ICD-10-CM | POA: Diagnosis not present

## 2021-10-17 DIAGNOSIS — R2681 Unsteadiness on feet: Secondary | ICD-10-CM | POA: Diagnosis not present

## 2021-10-20 DIAGNOSIS — R2681 Unsteadiness on feet: Secondary | ICD-10-CM | POA: Diagnosis not present

## 2021-10-20 DIAGNOSIS — F325 Major depressive disorder, single episode, in full remission: Secondary | ICD-10-CM | POA: Diagnosis not present

## 2021-10-20 DIAGNOSIS — M6281 Muscle weakness (generalized): Secondary | ICD-10-CM | POA: Diagnosis not present

## 2021-10-20 DIAGNOSIS — I4891 Unspecified atrial fibrillation: Secondary | ICD-10-CM | POA: Diagnosis not present

## 2021-10-20 DIAGNOSIS — F0390 Unspecified dementia without behavioral disturbance: Secondary | ICD-10-CM | POA: Diagnosis not present

## 2021-10-20 DIAGNOSIS — R41841 Cognitive communication deficit: Secondary | ICD-10-CM | POA: Diagnosis not present

## 2021-10-21 DIAGNOSIS — R2681 Unsteadiness on feet: Secondary | ICD-10-CM | POA: Diagnosis not present

## 2021-10-21 DIAGNOSIS — R41841 Cognitive communication deficit: Secondary | ICD-10-CM | POA: Diagnosis not present

## 2021-10-21 DIAGNOSIS — M6281 Muscle weakness (generalized): Secondary | ICD-10-CM | POA: Diagnosis not present

## 2021-10-21 DIAGNOSIS — F0390 Unspecified dementia without behavioral disturbance: Secondary | ICD-10-CM | POA: Diagnosis not present

## 2021-10-21 DIAGNOSIS — F325 Major depressive disorder, single episode, in full remission: Secondary | ICD-10-CM | POA: Diagnosis not present

## 2021-10-21 DIAGNOSIS — I4891 Unspecified atrial fibrillation: Secondary | ICD-10-CM | POA: Diagnosis not present

## 2021-10-21 DIAGNOSIS — R350 Frequency of micturition: Secondary | ICD-10-CM | POA: Diagnosis not present

## 2021-10-22 DIAGNOSIS — M6281 Muscle weakness (generalized): Secondary | ICD-10-CM | POA: Diagnosis not present

## 2021-10-22 DIAGNOSIS — F325 Major depressive disorder, single episode, in full remission: Secondary | ICD-10-CM | POA: Diagnosis not present

## 2021-10-22 DIAGNOSIS — I4891 Unspecified atrial fibrillation: Secondary | ICD-10-CM | POA: Diagnosis not present

## 2021-10-22 DIAGNOSIS — F0390 Unspecified dementia without behavioral disturbance: Secondary | ICD-10-CM | POA: Diagnosis not present

## 2021-10-22 DIAGNOSIS — R2681 Unsteadiness on feet: Secondary | ICD-10-CM | POA: Diagnosis not present

## 2021-10-22 DIAGNOSIS — R41841 Cognitive communication deficit: Secondary | ICD-10-CM | POA: Diagnosis not present

## 2021-10-23 DIAGNOSIS — F0390 Unspecified dementia without behavioral disturbance: Secondary | ICD-10-CM | POA: Diagnosis not present

## 2021-10-23 DIAGNOSIS — M6281 Muscle weakness (generalized): Secondary | ICD-10-CM | POA: Diagnosis not present

## 2021-10-23 DIAGNOSIS — R2681 Unsteadiness on feet: Secondary | ICD-10-CM | POA: Diagnosis not present

## 2021-10-23 DIAGNOSIS — F325 Major depressive disorder, single episode, in full remission: Secondary | ICD-10-CM | POA: Diagnosis not present

## 2021-10-23 DIAGNOSIS — R41841 Cognitive communication deficit: Secondary | ICD-10-CM | POA: Diagnosis not present

## 2021-10-23 DIAGNOSIS — I4891 Unspecified atrial fibrillation: Secondary | ICD-10-CM | POA: Diagnosis not present

## 2021-10-24 DIAGNOSIS — F325 Major depressive disorder, single episode, in full remission: Secondary | ICD-10-CM | POA: Diagnosis not present

## 2021-10-24 DIAGNOSIS — R2681 Unsteadiness on feet: Secondary | ICD-10-CM | POA: Diagnosis not present

## 2021-10-24 DIAGNOSIS — I4891 Unspecified atrial fibrillation: Secondary | ICD-10-CM | POA: Diagnosis not present

## 2021-10-24 DIAGNOSIS — R41841 Cognitive communication deficit: Secondary | ICD-10-CM | POA: Diagnosis not present

## 2021-10-24 DIAGNOSIS — M6281 Muscle weakness (generalized): Secondary | ICD-10-CM | POA: Diagnosis not present

## 2021-10-24 DIAGNOSIS — F0390 Unspecified dementia without behavioral disturbance: Secondary | ICD-10-CM | POA: Diagnosis not present

## 2021-10-27 DIAGNOSIS — R2681 Unsteadiness on feet: Secondary | ICD-10-CM | POA: Diagnosis not present

## 2021-10-27 DIAGNOSIS — F325 Major depressive disorder, single episode, in full remission: Secondary | ICD-10-CM | POA: Diagnosis not present

## 2021-10-27 DIAGNOSIS — I10 Essential (primary) hypertension: Secondary | ICD-10-CM | POA: Diagnosis not present

## 2021-10-27 DIAGNOSIS — Z9181 History of falling: Secondary | ICD-10-CM | POA: Diagnosis not present

## 2021-10-27 DIAGNOSIS — R350 Frequency of micturition: Secondary | ICD-10-CM | POA: Diagnosis not present

## 2021-10-27 DIAGNOSIS — Z86711 Personal history of pulmonary embolism: Secondary | ICD-10-CM | POA: Diagnosis not present

## 2021-10-27 DIAGNOSIS — M6281 Muscle weakness (generalized): Secondary | ICD-10-CM | POA: Diagnosis not present

## 2021-10-27 DIAGNOSIS — R41841 Cognitive communication deficit: Secondary | ICD-10-CM | POA: Diagnosis not present

## 2021-10-27 DIAGNOSIS — I4891 Unspecified atrial fibrillation: Secondary | ICD-10-CM | POA: Diagnosis not present

## 2021-10-27 DIAGNOSIS — G250 Essential tremor: Secondary | ICD-10-CM | POA: Diagnosis not present

## 2021-10-27 DIAGNOSIS — F0390 Unspecified dementia without behavioral disturbance: Secondary | ICD-10-CM | POA: Diagnosis not present

## 2021-10-27 DIAGNOSIS — M5412 Radiculopathy, cervical region: Secondary | ICD-10-CM | POA: Diagnosis not present

## 2021-10-27 DIAGNOSIS — Z593 Problems related to living in residential institution: Secondary | ICD-10-CM | POA: Diagnosis not present

## 2021-10-27 DIAGNOSIS — J302 Other seasonal allergic rhinitis: Secondary | ICD-10-CM | POA: Diagnosis not present

## 2021-10-27 DIAGNOSIS — Z853 Personal history of malignant neoplasm of breast: Secondary | ICD-10-CM | POA: Diagnosis not present

## 2021-10-27 DIAGNOSIS — R2689 Other abnormalities of gait and mobility: Secondary | ICD-10-CM | POA: Diagnosis not present

## 2021-10-28 DIAGNOSIS — R41841 Cognitive communication deficit: Secondary | ICD-10-CM | POA: Diagnosis not present

## 2021-10-28 DIAGNOSIS — F325 Major depressive disorder, single episode, in full remission: Secondary | ICD-10-CM | POA: Diagnosis not present

## 2021-10-28 DIAGNOSIS — M6281 Muscle weakness (generalized): Secondary | ICD-10-CM | POA: Diagnosis not present

## 2021-10-28 DIAGNOSIS — R2681 Unsteadiness on feet: Secondary | ICD-10-CM | POA: Diagnosis not present

## 2021-10-28 DIAGNOSIS — F0390 Unspecified dementia without behavioral disturbance: Secondary | ICD-10-CM | POA: Diagnosis not present

## 2021-10-28 DIAGNOSIS — I4891 Unspecified atrial fibrillation: Secondary | ICD-10-CM | POA: Diagnosis not present

## 2021-10-29 DIAGNOSIS — R2681 Unsteadiness on feet: Secondary | ICD-10-CM | POA: Diagnosis not present

## 2021-10-29 DIAGNOSIS — M6281 Muscle weakness (generalized): Secondary | ICD-10-CM | POA: Diagnosis not present

## 2021-10-29 DIAGNOSIS — R41841 Cognitive communication deficit: Secondary | ICD-10-CM | POA: Diagnosis not present

## 2021-10-29 DIAGNOSIS — F325 Major depressive disorder, single episode, in full remission: Secondary | ICD-10-CM | POA: Diagnosis not present

## 2021-10-29 DIAGNOSIS — F0390 Unspecified dementia without behavioral disturbance: Secondary | ICD-10-CM | POA: Diagnosis not present

## 2021-10-29 DIAGNOSIS — I4891 Unspecified atrial fibrillation: Secondary | ICD-10-CM | POA: Diagnosis not present

## 2021-10-30 DIAGNOSIS — F0390 Unspecified dementia without behavioral disturbance: Secondary | ICD-10-CM | POA: Diagnosis not present

## 2021-10-30 DIAGNOSIS — F325 Major depressive disorder, single episode, in full remission: Secondary | ICD-10-CM | POA: Diagnosis not present

## 2021-10-30 DIAGNOSIS — R2681 Unsteadiness on feet: Secondary | ICD-10-CM | POA: Diagnosis not present

## 2021-10-30 DIAGNOSIS — I4891 Unspecified atrial fibrillation: Secondary | ICD-10-CM | POA: Diagnosis not present

## 2021-10-30 DIAGNOSIS — R41841 Cognitive communication deficit: Secondary | ICD-10-CM | POA: Diagnosis not present

## 2021-10-30 DIAGNOSIS — M6281 Muscle weakness (generalized): Secondary | ICD-10-CM | POA: Diagnosis not present

## 2021-10-31 DIAGNOSIS — F325 Major depressive disorder, single episode, in full remission: Secondary | ICD-10-CM | POA: Diagnosis not present

## 2021-10-31 DIAGNOSIS — R41841 Cognitive communication deficit: Secondary | ICD-10-CM | POA: Diagnosis not present

## 2021-10-31 DIAGNOSIS — F0390 Unspecified dementia without behavioral disturbance: Secondary | ICD-10-CM | POA: Diagnosis not present

## 2021-10-31 DIAGNOSIS — I4891 Unspecified atrial fibrillation: Secondary | ICD-10-CM | POA: Diagnosis not present

## 2021-10-31 DIAGNOSIS — R2681 Unsteadiness on feet: Secondary | ICD-10-CM | POA: Diagnosis not present

## 2021-10-31 DIAGNOSIS — M6281 Muscle weakness (generalized): Secondary | ICD-10-CM | POA: Diagnosis not present

## 2021-11-03 DIAGNOSIS — F0390 Unspecified dementia without behavioral disturbance: Secondary | ICD-10-CM | POA: Diagnosis not present

## 2021-11-03 DIAGNOSIS — R2681 Unsteadiness on feet: Secondary | ICD-10-CM | POA: Diagnosis not present

## 2021-11-03 DIAGNOSIS — I4891 Unspecified atrial fibrillation: Secondary | ICD-10-CM | POA: Diagnosis not present

## 2021-11-03 DIAGNOSIS — R41841 Cognitive communication deficit: Secondary | ICD-10-CM | POA: Diagnosis not present

## 2021-11-03 DIAGNOSIS — M6281 Muscle weakness (generalized): Secondary | ICD-10-CM | POA: Diagnosis not present

## 2021-11-03 DIAGNOSIS — F325 Major depressive disorder, single episode, in full remission: Secondary | ICD-10-CM | POA: Diagnosis not present

## 2021-11-04 DIAGNOSIS — R2681 Unsteadiness on feet: Secondary | ICD-10-CM | POA: Diagnosis not present

## 2021-11-04 DIAGNOSIS — F0390 Unspecified dementia without behavioral disturbance: Secondary | ICD-10-CM | POA: Diagnosis not present

## 2021-11-04 DIAGNOSIS — L603 Nail dystrophy: Secondary | ICD-10-CM | POA: Diagnosis not present

## 2021-11-04 DIAGNOSIS — I7091 Generalized atherosclerosis: Secondary | ICD-10-CM | POA: Diagnosis not present

## 2021-11-04 DIAGNOSIS — M6281 Muscle weakness (generalized): Secondary | ICD-10-CM | POA: Diagnosis not present

## 2021-11-04 DIAGNOSIS — I4891 Unspecified atrial fibrillation: Secondary | ICD-10-CM | POA: Diagnosis not present

## 2021-11-04 DIAGNOSIS — R41841 Cognitive communication deficit: Secondary | ICD-10-CM | POA: Diagnosis not present

## 2021-11-04 DIAGNOSIS — B351 Tinea unguium: Secondary | ICD-10-CM | POA: Diagnosis not present

## 2021-11-04 DIAGNOSIS — F325 Major depressive disorder, single episode, in full remission: Secondary | ICD-10-CM | POA: Diagnosis not present

## 2021-11-05 DIAGNOSIS — M6281 Muscle weakness (generalized): Secondary | ICD-10-CM | POA: Diagnosis not present

## 2021-11-05 DIAGNOSIS — R41841 Cognitive communication deficit: Secondary | ICD-10-CM | POA: Diagnosis not present

## 2021-11-05 DIAGNOSIS — I4891 Unspecified atrial fibrillation: Secondary | ICD-10-CM | POA: Diagnosis not present

## 2021-11-05 DIAGNOSIS — R2681 Unsteadiness on feet: Secondary | ICD-10-CM | POA: Diagnosis not present

## 2021-11-05 DIAGNOSIS — F325 Major depressive disorder, single episode, in full remission: Secondary | ICD-10-CM | POA: Diagnosis not present

## 2021-11-05 DIAGNOSIS — F0390 Unspecified dementia without behavioral disturbance: Secondary | ICD-10-CM | POA: Diagnosis not present

## 2021-11-06 DIAGNOSIS — F325 Major depressive disorder, single episode, in full remission: Secondary | ICD-10-CM | POA: Diagnosis not present

## 2021-11-06 DIAGNOSIS — M6281 Muscle weakness (generalized): Secondary | ICD-10-CM | POA: Diagnosis not present

## 2021-11-06 DIAGNOSIS — R41841 Cognitive communication deficit: Secondary | ICD-10-CM | POA: Diagnosis not present

## 2021-11-06 DIAGNOSIS — R2681 Unsteadiness on feet: Secondary | ICD-10-CM | POA: Diagnosis not present

## 2021-11-06 DIAGNOSIS — I4891 Unspecified atrial fibrillation: Secondary | ICD-10-CM | POA: Diagnosis not present

## 2021-11-06 DIAGNOSIS — F0390 Unspecified dementia without behavioral disturbance: Secondary | ICD-10-CM | POA: Diagnosis not present

## 2021-11-07 DIAGNOSIS — I4891 Unspecified atrial fibrillation: Secondary | ICD-10-CM | POA: Diagnosis not present

## 2021-11-07 DIAGNOSIS — R41841 Cognitive communication deficit: Secondary | ICD-10-CM | POA: Diagnosis not present

## 2021-11-07 DIAGNOSIS — R2681 Unsteadiness on feet: Secondary | ICD-10-CM | POA: Diagnosis not present

## 2021-11-07 DIAGNOSIS — F0390 Unspecified dementia without behavioral disturbance: Secondary | ICD-10-CM | POA: Diagnosis not present

## 2021-11-07 DIAGNOSIS — F325 Major depressive disorder, single episode, in full remission: Secondary | ICD-10-CM | POA: Diagnosis not present

## 2021-11-07 DIAGNOSIS — M6281 Muscle weakness (generalized): Secondary | ICD-10-CM | POA: Diagnosis not present

## 2021-11-10 DIAGNOSIS — F325 Major depressive disorder, single episode, in full remission: Secondary | ICD-10-CM | POA: Diagnosis not present

## 2021-11-10 DIAGNOSIS — I4891 Unspecified atrial fibrillation: Secondary | ICD-10-CM | POA: Diagnosis not present

## 2021-11-10 DIAGNOSIS — F0390 Unspecified dementia without behavioral disturbance: Secondary | ICD-10-CM | POA: Diagnosis not present

## 2021-11-10 DIAGNOSIS — R41841 Cognitive communication deficit: Secondary | ICD-10-CM | POA: Diagnosis not present

## 2021-11-10 DIAGNOSIS — R2681 Unsteadiness on feet: Secondary | ICD-10-CM | POA: Diagnosis not present

## 2021-11-10 DIAGNOSIS — M6281 Muscle weakness (generalized): Secondary | ICD-10-CM | POA: Diagnosis not present

## 2021-11-11 DIAGNOSIS — I4891 Unspecified atrial fibrillation: Secondary | ICD-10-CM | POA: Diagnosis not present

## 2021-11-11 DIAGNOSIS — F325 Major depressive disorder, single episode, in full remission: Secondary | ICD-10-CM | POA: Diagnosis not present

## 2021-11-11 DIAGNOSIS — F0390 Unspecified dementia without behavioral disturbance: Secondary | ICD-10-CM | POA: Diagnosis not present

## 2021-11-11 DIAGNOSIS — R41841 Cognitive communication deficit: Secondary | ICD-10-CM | POA: Diagnosis not present

## 2021-11-11 DIAGNOSIS — M6281 Muscle weakness (generalized): Secondary | ICD-10-CM | POA: Diagnosis not present

## 2021-11-11 DIAGNOSIS — R2681 Unsteadiness on feet: Secondary | ICD-10-CM | POA: Diagnosis not present

## 2021-11-12 DIAGNOSIS — F325 Major depressive disorder, single episode, in full remission: Secondary | ICD-10-CM | POA: Diagnosis not present

## 2021-11-12 DIAGNOSIS — M6281 Muscle weakness (generalized): Secondary | ICD-10-CM | POA: Diagnosis not present

## 2021-11-12 DIAGNOSIS — R41841 Cognitive communication deficit: Secondary | ICD-10-CM | POA: Diagnosis not present

## 2021-11-12 DIAGNOSIS — I4891 Unspecified atrial fibrillation: Secondary | ICD-10-CM | POA: Diagnosis not present

## 2021-11-12 DIAGNOSIS — F0390 Unspecified dementia without behavioral disturbance: Secondary | ICD-10-CM | POA: Diagnosis not present

## 2021-11-12 DIAGNOSIS — R2681 Unsteadiness on feet: Secondary | ICD-10-CM | POA: Diagnosis not present

## 2021-11-13 DIAGNOSIS — F0390 Unspecified dementia without behavioral disturbance: Secondary | ICD-10-CM | POA: Diagnosis not present

## 2021-11-13 DIAGNOSIS — I4891 Unspecified atrial fibrillation: Secondary | ICD-10-CM | POA: Diagnosis not present

## 2021-11-13 DIAGNOSIS — F325 Major depressive disorder, single episode, in full remission: Secondary | ICD-10-CM | POA: Diagnosis not present

## 2021-11-13 DIAGNOSIS — R2681 Unsteadiness on feet: Secondary | ICD-10-CM | POA: Diagnosis not present

## 2021-11-13 DIAGNOSIS — M6281 Muscle weakness (generalized): Secondary | ICD-10-CM | POA: Diagnosis not present

## 2021-11-13 DIAGNOSIS — R41841 Cognitive communication deficit: Secondary | ICD-10-CM | POA: Diagnosis not present

## 2021-11-14 DIAGNOSIS — F0390 Unspecified dementia without behavioral disturbance: Secondary | ICD-10-CM | POA: Diagnosis not present

## 2021-11-14 DIAGNOSIS — F325 Major depressive disorder, single episode, in full remission: Secondary | ICD-10-CM | POA: Diagnosis not present

## 2021-11-14 DIAGNOSIS — M6281 Muscle weakness (generalized): Secondary | ICD-10-CM | POA: Diagnosis not present

## 2021-11-14 DIAGNOSIS — I4891 Unspecified atrial fibrillation: Secondary | ICD-10-CM | POA: Diagnosis not present

## 2021-11-14 DIAGNOSIS — R2681 Unsteadiness on feet: Secondary | ICD-10-CM | POA: Diagnosis not present

## 2021-11-14 DIAGNOSIS — R41841 Cognitive communication deficit: Secondary | ICD-10-CM | POA: Diagnosis not present

## 2021-11-17 DIAGNOSIS — F0390 Unspecified dementia without behavioral disturbance: Secondary | ICD-10-CM | POA: Diagnosis not present

## 2021-11-17 DIAGNOSIS — R41841 Cognitive communication deficit: Secondary | ICD-10-CM | POA: Diagnosis not present

## 2021-11-17 DIAGNOSIS — I4891 Unspecified atrial fibrillation: Secondary | ICD-10-CM | POA: Diagnosis not present

## 2021-11-17 DIAGNOSIS — M6281 Muscle weakness (generalized): Secondary | ICD-10-CM | POA: Diagnosis not present

## 2021-11-17 DIAGNOSIS — R2681 Unsteadiness on feet: Secondary | ICD-10-CM | POA: Diagnosis not present

## 2021-11-17 DIAGNOSIS — F325 Major depressive disorder, single episode, in full remission: Secondary | ICD-10-CM | POA: Diagnosis not present

## 2021-11-18 DIAGNOSIS — R41841 Cognitive communication deficit: Secondary | ICD-10-CM | POA: Diagnosis not present

## 2021-11-18 DIAGNOSIS — R2681 Unsteadiness on feet: Secondary | ICD-10-CM | POA: Diagnosis not present

## 2021-11-18 DIAGNOSIS — F0390 Unspecified dementia without behavioral disturbance: Secondary | ICD-10-CM | POA: Diagnosis not present

## 2021-11-18 DIAGNOSIS — I4891 Unspecified atrial fibrillation: Secondary | ICD-10-CM | POA: Diagnosis not present

## 2021-11-18 DIAGNOSIS — M6281 Muscle weakness (generalized): Secondary | ICD-10-CM | POA: Diagnosis not present

## 2021-11-18 DIAGNOSIS — F325 Major depressive disorder, single episode, in full remission: Secondary | ICD-10-CM | POA: Diagnosis not present

## 2021-11-19 DIAGNOSIS — F325 Major depressive disorder, single episode, in full remission: Secondary | ICD-10-CM | POA: Diagnosis not present

## 2021-11-19 DIAGNOSIS — I4891 Unspecified atrial fibrillation: Secondary | ICD-10-CM | POA: Diagnosis not present

## 2021-11-19 DIAGNOSIS — F0390 Unspecified dementia without behavioral disturbance: Secondary | ICD-10-CM | POA: Diagnosis not present

## 2021-11-19 DIAGNOSIS — R41841 Cognitive communication deficit: Secondary | ICD-10-CM | POA: Diagnosis not present

## 2021-11-19 DIAGNOSIS — M6281 Muscle weakness (generalized): Secondary | ICD-10-CM | POA: Diagnosis not present

## 2021-11-19 DIAGNOSIS — R2681 Unsteadiness on feet: Secondary | ICD-10-CM | POA: Diagnosis not present

## 2021-11-20 DIAGNOSIS — F0390 Unspecified dementia without behavioral disturbance: Secondary | ICD-10-CM | POA: Diagnosis not present

## 2021-11-20 DIAGNOSIS — M6281 Muscle weakness (generalized): Secondary | ICD-10-CM | POA: Diagnosis not present

## 2021-11-20 DIAGNOSIS — R41841 Cognitive communication deficit: Secondary | ICD-10-CM | POA: Diagnosis not present

## 2021-11-20 DIAGNOSIS — I4891 Unspecified atrial fibrillation: Secondary | ICD-10-CM | POA: Diagnosis not present

## 2021-11-20 DIAGNOSIS — F325 Major depressive disorder, single episode, in full remission: Secondary | ICD-10-CM | POA: Diagnosis not present

## 2021-11-20 DIAGNOSIS — R2681 Unsteadiness on feet: Secondary | ICD-10-CM | POA: Diagnosis not present

## 2021-11-21 DIAGNOSIS — R41841 Cognitive communication deficit: Secondary | ICD-10-CM | POA: Diagnosis not present

## 2021-11-21 DIAGNOSIS — R2681 Unsteadiness on feet: Secondary | ICD-10-CM | POA: Diagnosis not present

## 2021-11-21 DIAGNOSIS — F0390 Unspecified dementia without behavioral disturbance: Secondary | ICD-10-CM | POA: Diagnosis not present

## 2021-11-21 DIAGNOSIS — M6281 Muscle weakness (generalized): Secondary | ICD-10-CM | POA: Diagnosis not present

## 2021-11-21 DIAGNOSIS — F325 Major depressive disorder, single episode, in full remission: Secondary | ICD-10-CM | POA: Diagnosis not present

## 2021-11-21 DIAGNOSIS — I4891 Unspecified atrial fibrillation: Secondary | ICD-10-CM | POA: Diagnosis not present

## 2021-11-24 DIAGNOSIS — M6281 Muscle weakness (generalized): Secondary | ICD-10-CM | POA: Diagnosis not present

## 2021-11-24 DIAGNOSIS — Z86711 Personal history of pulmonary embolism: Secondary | ICD-10-CM | POA: Diagnosis not present

## 2021-11-24 DIAGNOSIS — Z593 Problems related to living in residential institution: Secondary | ICD-10-CM | POA: Diagnosis not present

## 2021-11-24 DIAGNOSIS — R350 Frequency of micturition: Secondary | ICD-10-CM | POA: Diagnosis not present

## 2021-11-24 DIAGNOSIS — R41841 Cognitive communication deficit: Secondary | ICD-10-CM | POA: Diagnosis not present

## 2021-11-24 DIAGNOSIS — Z9181 History of falling: Secondary | ICD-10-CM | POA: Diagnosis not present

## 2021-11-24 DIAGNOSIS — N39498 Other specified urinary incontinence: Secondary | ICD-10-CM | POA: Diagnosis not present

## 2021-11-24 DIAGNOSIS — J302 Other seasonal allergic rhinitis: Secondary | ICD-10-CM | POA: Diagnosis not present

## 2021-11-24 DIAGNOSIS — R2681 Unsteadiness on feet: Secondary | ICD-10-CM | POA: Diagnosis not present

## 2021-11-24 DIAGNOSIS — F0390 Unspecified dementia without behavioral disturbance: Secondary | ICD-10-CM | POA: Diagnosis not present

## 2021-11-24 DIAGNOSIS — I4891 Unspecified atrial fibrillation: Secondary | ICD-10-CM | POA: Diagnosis not present

## 2021-11-24 DIAGNOSIS — Z853 Personal history of malignant neoplasm of breast: Secondary | ICD-10-CM | POA: Diagnosis not present

## 2021-11-24 DIAGNOSIS — F0280 Dementia in other diseases classified elsewhere without behavioral disturbance: Secondary | ICD-10-CM | POA: Diagnosis not present

## 2021-11-24 DIAGNOSIS — G250 Essential tremor: Secondary | ICD-10-CM | POA: Diagnosis not present

## 2021-11-24 DIAGNOSIS — R2689 Other abnormalities of gait and mobility: Secondary | ICD-10-CM | POA: Diagnosis not present

## 2021-11-24 DIAGNOSIS — I10 Essential (primary) hypertension: Secondary | ICD-10-CM | POA: Diagnosis not present

## 2021-11-24 DIAGNOSIS — M5412 Radiculopathy, cervical region: Secondary | ICD-10-CM | POA: Diagnosis not present

## 2021-11-24 DIAGNOSIS — F325 Major depressive disorder, single episode, in full remission: Secondary | ICD-10-CM | POA: Diagnosis not present

## 2021-11-25 DIAGNOSIS — R2681 Unsteadiness on feet: Secondary | ICD-10-CM | POA: Diagnosis not present

## 2021-11-25 DIAGNOSIS — N39498 Other specified urinary incontinence: Secondary | ICD-10-CM | POA: Diagnosis not present

## 2021-11-25 DIAGNOSIS — Z9181 History of falling: Secondary | ICD-10-CM | POA: Diagnosis not present

## 2021-11-25 DIAGNOSIS — R41841 Cognitive communication deficit: Secondary | ICD-10-CM | POA: Diagnosis not present

## 2021-11-25 DIAGNOSIS — M6281 Muscle weakness (generalized): Secondary | ICD-10-CM | POA: Diagnosis not present

## 2021-11-25 DIAGNOSIS — F0280 Dementia in other diseases classified elsewhere without behavioral disturbance: Secondary | ICD-10-CM | POA: Diagnosis not present

## 2021-11-26 DIAGNOSIS — N39498 Other specified urinary incontinence: Secondary | ICD-10-CM | POA: Diagnosis not present

## 2021-11-26 DIAGNOSIS — F0280 Dementia in other diseases classified elsewhere without behavioral disturbance: Secondary | ICD-10-CM | POA: Diagnosis not present

## 2021-11-26 DIAGNOSIS — M6281 Muscle weakness (generalized): Secondary | ICD-10-CM | POA: Diagnosis not present

## 2021-11-26 DIAGNOSIS — R41841 Cognitive communication deficit: Secondary | ICD-10-CM | POA: Diagnosis not present

## 2021-11-26 DIAGNOSIS — R2681 Unsteadiness on feet: Secondary | ICD-10-CM | POA: Diagnosis not present

## 2021-11-26 DIAGNOSIS — Z9181 History of falling: Secondary | ICD-10-CM | POA: Diagnosis not present

## 2021-11-27 DIAGNOSIS — R41841 Cognitive communication deficit: Secondary | ICD-10-CM | POA: Diagnosis not present

## 2021-11-27 DIAGNOSIS — M6281 Muscle weakness (generalized): Secondary | ICD-10-CM | POA: Diagnosis not present

## 2021-11-27 DIAGNOSIS — N39498 Other specified urinary incontinence: Secondary | ICD-10-CM | POA: Diagnosis not present

## 2021-11-27 DIAGNOSIS — R2681 Unsteadiness on feet: Secondary | ICD-10-CM | POA: Diagnosis not present

## 2021-11-27 DIAGNOSIS — Z9181 History of falling: Secondary | ICD-10-CM | POA: Diagnosis not present

## 2021-11-27 DIAGNOSIS — F0280 Dementia in other diseases classified elsewhere without behavioral disturbance: Secondary | ICD-10-CM | POA: Diagnosis not present

## 2021-11-28 DIAGNOSIS — R41841 Cognitive communication deficit: Secondary | ICD-10-CM | POA: Diagnosis not present

## 2021-11-28 DIAGNOSIS — Z9181 History of falling: Secondary | ICD-10-CM | POA: Diagnosis not present

## 2021-11-28 DIAGNOSIS — F0280 Dementia in other diseases classified elsewhere without behavioral disturbance: Secondary | ICD-10-CM | POA: Diagnosis not present

## 2021-11-28 DIAGNOSIS — N39498 Other specified urinary incontinence: Secondary | ICD-10-CM | POA: Diagnosis not present

## 2021-11-28 DIAGNOSIS — R2681 Unsteadiness on feet: Secondary | ICD-10-CM | POA: Diagnosis not present

## 2021-11-28 DIAGNOSIS — M6281 Muscle weakness (generalized): Secondary | ICD-10-CM | POA: Diagnosis not present

## 2021-12-01 DIAGNOSIS — R41841 Cognitive communication deficit: Secondary | ICD-10-CM | POA: Diagnosis not present

## 2021-12-01 DIAGNOSIS — Z9181 History of falling: Secondary | ICD-10-CM | POA: Diagnosis not present

## 2021-12-01 DIAGNOSIS — M6281 Muscle weakness (generalized): Secondary | ICD-10-CM | POA: Diagnosis not present

## 2021-12-01 DIAGNOSIS — N39498 Other specified urinary incontinence: Secondary | ICD-10-CM | POA: Diagnosis not present

## 2021-12-01 DIAGNOSIS — F0280 Dementia in other diseases classified elsewhere without behavioral disturbance: Secondary | ICD-10-CM | POA: Diagnosis not present

## 2021-12-01 DIAGNOSIS — R2681 Unsteadiness on feet: Secondary | ICD-10-CM | POA: Diagnosis not present

## 2021-12-02 ENCOUNTER — Encounter: Payer: Self-pay | Admitting: *Deleted

## 2021-12-02 DIAGNOSIS — F0280 Dementia in other diseases classified elsewhere without behavioral disturbance: Secondary | ICD-10-CM | POA: Diagnosis not present

## 2021-12-02 DIAGNOSIS — R41841 Cognitive communication deficit: Secondary | ICD-10-CM | POA: Diagnosis not present

## 2021-12-02 DIAGNOSIS — Z9181 History of falling: Secondary | ICD-10-CM | POA: Diagnosis not present

## 2021-12-02 DIAGNOSIS — R2681 Unsteadiness on feet: Secondary | ICD-10-CM | POA: Diagnosis not present

## 2021-12-02 DIAGNOSIS — M6281 Muscle weakness (generalized): Secondary | ICD-10-CM | POA: Diagnosis not present

## 2021-12-02 DIAGNOSIS — N39498 Other specified urinary incontinence: Secondary | ICD-10-CM | POA: Diagnosis not present

## 2021-12-02 NOTE — Telephone Encounter (Signed)
Pt due for Prolia after 12/10/21. However, she is no longer a patient with our office. I have sent her a mychart just to let her know her next due date. ? ?Prolia records have been Archived ?

## 2021-12-03 DIAGNOSIS — F0280 Dementia in other diseases classified elsewhere without behavioral disturbance: Secondary | ICD-10-CM | POA: Diagnosis not present

## 2021-12-03 DIAGNOSIS — N39498 Other specified urinary incontinence: Secondary | ICD-10-CM | POA: Diagnosis not present

## 2021-12-03 DIAGNOSIS — Z9181 History of falling: Secondary | ICD-10-CM | POA: Diagnosis not present

## 2021-12-03 DIAGNOSIS — R2681 Unsteadiness on feet: Secondary | ICD-10-CM | POA: Diagnosis not present

## 2021-12-03 DIAGNOSIS — R41841 Cognitive communication deficit: Secondary | ICD-10-CM | POA: Diagnosis not present

## 2021-12-03 DIAGNOSIS — M6281 Muscle weakness (generalized): Secondary | ICD-10-CM | POA: Diagnosis not present

## 2021-12-04 DIAGNOSIS — Z9181 History of falling: Secondary | ICD-10-CM | POA: Diagnosis not present

## 2021-12-04 DIAGNOSIS — M6281 Muscle weakness (generalized): Secondary | ICD-10-CM | POA: Diagnosis not present

## 2021-12-04 DIAGNOSIS — R2681 Unsteadiness on feet: Secondary | ICD-10-CM | POA: Diagnosis not present

## 2021-12-04 DIAGNOSIS — N39498 Other specified urinary incontinence: Secondary | ICD-10-CM | POA: Diagnosis not present

## 2021-12-04 DIAGNOSIS — F0280 Dementia in other diseases classified elsewhere without behavioral disturbance: Secondary | ICD-10-CM | POA: Diagnosis not present

## 2021-12-04 DIAGNOSIS — R41841 Cognitive communication deficit: Secondary | ICD-10-CM | POA: Diagnosis not present

## 2021-12-05 DIAGNOSIS — Z9181 History of falling: Secondary | ICD-10-CM | POA: Diagnosis not present

## 2021-12-05 DIAGNOSIS — R41841 Cognitive communication deficit: Secondary | ICD-10-CM | POA: Diagnosis not present

## 2021-12-05 DIAGNOSIS — R2681 Unsteadiness on feet: Secondary | ICD-10-CM | POA: Diagnosis not present

## 2021-12-05 DIAGNOSIS — M6281 Muscle weakness (generalized): Secondary | ICD-10-CM | POA: Diagnosis not present

## 2021-12-05 DIAGNOSIS — F0280 Dementia in other diseases classified elsewhere without behavioral disturbance: Secondary | ICD-10-CM | POA: Diagnosis not present

## 2021-12-05 DIAGNOSIS — N39498 Other specified urinary incontinence: Secondary | ICD-10-CM | POA: Diagnosis not present

## 2021-12-08 DIAGNOSIS — N39498 Other specified urinary incontinence: Secondary | ICD-10-CM | POA: Diagnosis not present

## 2021-12-08 DIAGNOSIS — M6281 Muscle weakness (generalized): Secondary | ICD-10-CM | POA: Diagnosis not present

## 2021-12-08 DIAGNOSIS — R2681 Unsteadiness on feet: Secondary | ICD-10-CM | POA: Diagnosis not present

## 2021-12-08 DIAGNOSIS — R41841 Cognitive communication deficit: Secondary | ICD-10-CM | POA: Diagnosis not present

## 2021-12-08 DIAGNOSIS — Z9181 History of falling: Secondary | ICD-10-CM | POA: Diagnosis not present

## 2021-12-08 DIAGNOSIS — F0280 Dementia in other diseases classified elsewhere without behavioral disturbance: Secondary | ICD-10-CM | POA: Diagnosis not present

## 2021-12-10 DIAGNOSIS — Z9181 History of falling: Secondary | ICD-10-CM | POA: Diagnosis not present

## 2021-12-10 DIAGNOSIS — R41841 Cognitive communication deficit: Secondary | ICD-10-CM | POA: Diagnosis not present

## 2021-12-10 DIAGNOSIS — F0280 Dementia in other diseases classified elsewhere without behavioral disturbance: Secondary | ICD-10-CM | POA: Diagnosis not present

## 2021-12-10 DIAGNOSIS — M6281 Muscle weakness (generalized): Secondary | ICD-10-CM | POA: Diagnosis not present

## 2021-12-10 DIAGNOSIS — N39498 Other specified urinary incontinence: Secondary | ICD-10-CM | POA: Diagnosis not present

## 2021-12-10 DIAGNOSIS — R2681 Unsteadiness on feet: Secondary | ICD-10-CM | POA: Diagnosis not present

## 2021-12-11 DIAGNOSIS — N39498 Other specified urinary incontinence: Secondary | ICD-10-CM | POA: Diagnosis not present

## 2021-12-11 DIAGNOSIS — R2681 Unsteadiness on feet: Secondary | ICD-10-CM | POA: Diagnosis not present

## 2021-12-11 DIAGNOSIS — Z9181 History of falling: Secondary | ICD-10-CM | POA: Diagnosis not present

## 2021-12-11 DIAGNOSIS — F0280 Dementia in other diseases classified elsewhere without behavioral disturbance: Secondary | ICD-10-CM | POA: Diagnosis not present

## 2021-12-11 DIAGNOSIS — M6281 Muscle weakness (generalized): Secondary | ICD-10-CM | POA: Diagnosis not present

## 2021-12-11 DIAGNOSIS — R41841 Cognitive communication deficit: Secondary | ICD-10-CM | POA: Diagnosis not present

## 2021-12-12 DIAGNOSIS — R41841 Cognitive communication deficit: Secondary | ICD-10-CM | POA: Diagnosis not present

## 2021-12-12 DIAGNOSIS — F0390 Unspecified dementia without behavioral disturbance: Secondary | ICD-10-CM | POA: Diagnosis not present

## 2021-12-12 DIAGNOSIS — R32 Unspecified urinary incontinence: Secondary | ICD-10-CM | POA: Diagnosis not present

## 2021-12-12 DIAGNOSIS — M6281 Muscle weakness (generalized): Secondary | ICD-10-CM | POA: Diagnosis not present

## 2021-12-12 DIAGNOSIS — N39498 Other specified urinary incontinence: Secondary | ICD-10-CM | POA: Diagnosis not present

## 2021-12-12 DIAGNOSIS — Z9181 History of falling: Secondary | ICD-10-CM | POA: Diagnosis not present

## 2021-12-12 DIAGNOSIS — R2681 Unsteadiness on feet: Secondary | ICD-10-CM | POA: Diagnosis not present

## 2021-12-12 DIAGNOSIS — F0280 Dementia in other diseases classified elsewhere without behavioral disturbance: Secondary | ICD-10-CM | POA: Diagnosis not present

## 2021-12-15 DIAGNOSIS — F0280 Dementia in other diseases classified elsewhere without behavioral disturbance: Secondary | ICD-10-CM | POA: Diagnosis not present

## 2021-12-15 DIAGNOSIS — N39498 Other specified urinary incontinence: Secondary | ICD-10-CM | POA: Diagnosis not present

## 2021-12-15 DIAGNOSIS — R2681 Unsteadiness on feet: Secondary | ICD-10-CM | POA: Diagnosis not present

## 2021-12-15 DIAGNOSIS — M6281 Muscle weakness (generalized): Secondary | ICD-10-CM | POA: Diagnosis not present

## 2021-12-15 DIAGNOSIS — M810 Age-related osteoporosis without current pathological fracture: Secondary | ICD-10-CM | POA: Diagnosis not present

## 2021-12-15 DIAGNOSIS — R41841 Cognitive communication deficit: Secondary | ICD-10-CM | POA: Diagnosis not present

## 2021-12-15 DIAGNOSIS — Z9181 History of falling: Secondary | ICD-10-CM | POA: Diagnosis not present

## 2021-12-16 DIAGNOSIS — R2681 Unsteadiness on feet: Secondary | ICD-10-CM | POA: Diagnosis not present

## 2021-12-16 DIAGNOSIS — F0280 Dementia in other diseases classified elsewhere without behavioral disturbance: Secondary | ICD-10-CM | POA: Diagnosis not present

## 2021-12-16 DIAGNOSIS — N39498 Other specified urinary incontinence: Secondary | ICD-10-CM | POA: Diagnosis not present

## 2021-12-16 DIAGNOSIS — R41841 Cognitive communication deficit: Secondary | ICD-10-CM | POA: Diagnosis not present

## 2021-12-16 DIAGNOSIS — M6281 Muscle weakness (generalized): Secondary | ICD-10-CM | POA: Diagnosis not present

## 2021-12-16 DIAGNOSIS — Z9181 History of falling: Secondary | ICD-10-CM | POA: Diagnosis not present

## 2021-12-17 DIAGNOSIS — R2681 Unsteadiness on feet: Secondary | ICD-10-CM | POA: Diagnosis not present

## 2021-12-17 DIAGNOSIS — M6281 Muscle weakness (generalized): Secondary | ICD-10-CM | POA: Diagnosis not present

## 2021-12-17 DIAGNOSIS — Z9181 History of falling: Secondary | ICD-10-CM | POA: Diagnosis not present

## 2021-12-17 DIAGNOSIS — R41841 Cognitive communication deficit: Secondary | ICD-10-CM | POA: Diagnosis not present

## 2021-12-17 DIAGNOSIS — F0280 Dementia in other diseases classified elsewhere without behavioral disturbance: Secondary | ICD-10-CM | POA: Diagnosis not present

## 2021-12-17 DIAGNOSIS — N39498 Other specified urinary incontinence: Secondary | ICD-10-CM | POA: Diagnosis not present

## 2021-12-18 DIAGNOSIS — Z9181 History of falling: Secondary | ICD-10-CM | POA: Diagnosis not present

## 2021-12-18 DIAGNOSIS — N39498 Other specified urinary incontinence: Secondary | ICD-10-CM | POA: Diagnosis not present

## 2021-12-18 DIAGNOSIS — F0280 Dementia in other diseases classified elsewhere without behavioral disturbance: Secondary | ICD-10-CM | POA: Diagnosis not present

## 2021-12-18 DIAGNOSIS — R41841 Cognitive communication deficit: Secondary | ICD-10-CM | POA: Diagnosis not present

## 2021-12-18 DIAGNOSIS — R2681 Unsteadiness on feet: Secondary | ICD-10-CM | POA: Diagnosis not present

## 2021-12-18 DIAGNOSIS — M6281 Muscle weakness (generalized): Secondary | ICD-10-CM | POA: Diagnosis not present

## 2021-12-19 DIAGNOSIS — R41841 Cognitive communication deficit: Secondary | ICD-10-CM | POA: Diagnosis not present

## 2021-12-19 DIAGNOSIS — N39498 Other specified urinary incontinence: Secondary | ICD-10-CM | POA: Diagnosis not present

## 2021-12-19 DIAGNOSIS — R2681 Unsteadiness on feet: Secondary | ICD-10-CM | POA: Diagnosis not present

## 2021-12-19 DIAGNOSIS — F0280 Dementia in other diseases classified elsewhere without behavioral disturbance: Secondary | ICD-10-CM | POA: Diagnosis not present

## 2021-12-19 DIAGNOSIS — Z9181 History of falling: Secondary | ICD-10-CM | POA: Diagnosis not present

## 2021-12-19 DIAGNOSIS — M6281 Muscle weakness (generalized): Secondary | ICD-10-CM | POA: Diagnosis not present

## 2021-12-22 DIAGNOSIS — R2681 Unsteadiness on feet: Secondary | ICD-10-CM | POA: Diagnosis not present

## 2021-12-22 DIAGNOSIS — Z9181 History of falling: Secondary | ICD-10-CM | POA: Diagnosis not present

## 2021-12-22 DIAGNOSIS — R41841 Cognitive communication deficit: Secondary | ICD-10-CM | POA: Diagnosis not present

## 2021-12-22 DIAGNOSIS — M6281 Muscle weakness (generalized): Secondary | ICD-10-CM | POA: Diagnosis not present

## 2021-12-22 DIAGNOSIS — F0280 Dementia in other diseases classified elsewhere without behavioral disturbance: Secondary | ICD-10-CM | POA: Diagnosis not present

## 2021-12-22 DIAGNOSIS — N39498 Other specified urinary incontinence: Secondary | ICD-10-CM | POA: Diagnosis not present

## 2021-12-23 DIAGNOSIS — N39498 Other specified urinary incontinence: Secondary | ICD-10-CM | POA: Diagnosis not present

## 2021-12-23 DIAGNOSIS — Z9181 History of falling: Secondary | ICD-10-CM | POA: Diagnosis not present

## 2021-12-23 DIAGNOSIS — R41841 Cognitive communication deficit: Secondary | ICD-10-CM | POA: Diagnosis not present

## 2021-12-23 DIAGNOSIS — F0280 Dementia in other diseases classified elsewhere without behavioral disturbance: Secondary | ICD-10-CM | POA: Diagnosis not present

## 2021-12-23 DIAGNOSIS — M6281 Muscle weakness (generalized): Secondary | ICD-10-CM | POA: Diagnosis not present

## 2021-12-23 DIAGNOSIS — R2681 Unsteadiness on feet: Secondary | ICD-10-CM | POA: Diagnosis not present

## 2021-12-24 DIAGNOSIS — M6281 Muscle weakness (generalized): Secondary | ICD-10-CM | POA: Diagnosis not present

## 2021-12-24 DIAGNOSIS — F0280 Dementia in other diseases classified elsewhere without behavioral disturbance: Secondary | ICD-10-CM | POA: Diagnosis not present

## 2021-12-24 DIAGNOSIS — Z9181 History of falling: Secondary | ICD-10-CM | POA: Diagnosis not present

## 2021-12-24 DIAGNOSIS — N39498 Other specified urinary incontinence: Secondary | ICD-10-CM | POA: Diagnosis not present

## 2021-12-24 DIAGNOSIS — R41841 Cognitive communication deficit: Secondary | ICD-10-CM | POA: Diagnosis not present

## 2021-12-24 DIAGNOSIS — R2681 Unsteadiness on feet: Secondary | ICD-10-CM | POA: Diagnosis not present

## 2021-12-25 DIAGNOSIS — Z9181 History of falling: Secondary | ICD-10-CM | POA: Diagnosis not present

## 2021-12-25 DIAGNOSIS — Z86711 Personal history of pulmonary embolism: Secondary | ICD-10-CM | POA: Diagnosis not present

## 2021-12-25 DIAGNOSIS — F325 Major depressive disorder, single episode, in full remission: Secondary | ICD-10-CM | POA: Diagnosis not present

## 2021-12-25 DIAGNOSIS — J302 Other seasonal allergic rhinitis: Secondary | ICD-10-CM | POA: Diagnosis not present

## 2021-12-25 DIAGNOSIS — I4891 Unspecified atrial fibrillation: Secondary | ICD-10-CM | POA: Diagnosis not present

## 2021-12-25 DIAGNOSIS — M5412 Radiculopathy, cervical region: Secondary | ICD-10-CM | POA: Diagnosis not present

## 2021-12-25 DIAGNOSIS — R350 Frequency of micturition: Secondary | ICD-10-CM | POA: Diagnosis not present

## 2021-12-25 DIAGNOSIS — N39498 Other specified urinary incontinence: Secondary | ICD-10-CM | POA: Diagnosis not present

## 2021-12-25 DIAGNOSIS — Z853 Personal history of malignant neoplasm of breast: Secondary | ICD-10-CM | POA: Diagnosis not present

## 2021-12-25 DIAGNOSIS — I10 Essential (primary) hypertension: Secondary | ICD-10-CM | POA: Diagnosis not present

## 2021-12-25 DIAGNOSIS — R41841 Cognitive communication deficit: Secondary | ICD-10-CM | POA: Diagnosis not present

## 2021-12-25 DIAGNOSIS — R2689 Other abnormalities of gait and mobility: Secondary | ICD-10-CM | POA: Diagnosis not present

## 2021-12-25 DIAGNOSIS — R600 Localized edema: Secondary | ICD-10-CM | POA: Diagnosis not present

## 2021-12-25 DIAGNOSIS — R279 Unspecified lack of coordination: Secondary | ICD-10-CM | POA: Diagnosis not present

## 2021-12-25 DIAGNOSIS — F0280 Dementia in other diseases classified elsewhere without behavioral disturbance: Secondary | ICD-10-CM | POA: Diagnosis not present

## 2021-12-25 DIAGNOSIS — Z593 Problems related to living in residential institution: Secondary | ICD-10-CM | POA: Diagnosis not present

## 2021-12-25 DIAGNOSIS — R2681 Unsteadiness on feet: Secondary | ICD-10-CM | POA: Diagnosis not present

## 2021-12-25 DIAGNOSIS — M6281 Muscle weakness (generalized): Secondary | ICD-10-CM | POA: Diagnosis not present

## 2021-12-25 DIAGNOSIS — G250 Essential tremor: Secondary | ICD-10-CM | POA: Diagnosis not present

## 2021-12-26 DIAGNOSIS — M6281 Muscle weakness (generalized): Secondary | ICD-10-CM | POA: Diagnosis not present

## 2021-12-26 DIAGNOSIS — R279 Unspecified lack of coordination: Secondary | ICD-10-CM | POA: Diagnosis not present

## 2021-12-26 DIAGNOSIS — Z9181 History of falling: Secondary | ICD-10-CM | POA: Diagnosis not present

## 2021-12-26 DIAGNOSIS — R2689 Other abnormalities of gait and mobility: Secondary | ICD-10-CM | POA: Diagnosis not present

## 2021-12-26 DIAGNOSIS — R2681 Unsteadiness on feet: Secondary | ICD-10-CM | POA: Diagnosis not present

## 2021-12-26 DIAGNOSIS — F0280 Dementia in other diseases classified elsewhere without behavioral disturbance: Secondary | ICD-10-CM | POA: Diagnosis not present

## 2021-12-29 DIAGNOSIS — Z9181 History of falling: Secondary | ICD-10-CM | POA: Diagnosis not present

## 2021-12-29 DIAGNOSIS — R2689 Other abnormalities of gait and mobility: Secondary | ICD-10-CM | POA: Diagnosis not present

## 2021-12-29 DIAGNOSIS — R279 Unspecified lack of coordination: Secondary | ICD-10-CM | POA: Diagnosis not present

## 2021-12-29 DIAGNOSIS — F0280 Dementia in other diseases classified elsewhere without behavioral disturbance: Secondary | ICD-10-CM | POA: Diagnosis not present

## 2021-12-29 DIAGNOSIS — M6281 Muscle weakness (generalized): Secondary | ICD-10-CM | POA: Diagnosis not present

## 2021-12-29 DIAGNOSIS — R2681 Unsteadiness on feet: Secondary | ICD-10-CM | POA: Diagnosis not present

## 2022-01-01 DIAGNOSIS — I4891 Unspecified atrial fibrillation: Secondary | ICD-10-CM | POA: Diagnosis not present

## 2022-01-01 DIAGNOSIS — F0390 Unspecified dementia without behavioral disturbance: Secondary | ICD-10-CM | POA: Diagnosis not present

## 2022-01-01 DIAGNOSIS — Z593 Problems related to living in residential institution: Secondary | ICD-10-CM | POA: Diagnosis not present

## 2022-01-01 DIAGNOSIS — R32 Unspecified urinary incontinence: Secondary | ICD-10-CM | POA: Diagnosis not present

## 2022-01-29 DIAGNOSIS — M25521 Pain in right elbow: Secondary | ICD-10-CM | POA: Diagnosis not present

## 2022-01-29 DIAGNOSIS — M25511 Pain in right shoulder: Secondary | ICD-10-CM | POA: Diagnosis not present

## 2022-02-02 DIAGNOSIS — F0280 Dementia in other diseases classified elsewhere without behavioral disturbance: Secondary | ICD-10-CM | POA: Diagnosis not present

## 2022-02-02 DIAGNOSIS — M6281 Muscle weakness (generalized): Secondary | ICD-10-CM | POA: Diagnosis not present

## 2022-02-02 DIAGNOSIS — R2689 Other abnormalities of gait and mobility: Secondary | ICD-10-CM | POA: Diagnosis not present

## 2022-02-02 DIAGNOSIS — R279 Unspecified lack of coordination: Secondary | ICD-10-CM | POA: Diagnosis not present

## 2022-02-02 DIAGNOSIS — Z9181 History of falling: Secondary | ICD-10-CM | POA: Diagnosis not present

## 2022-02-03 DIAGNOSIS — Z9181 History of falling: Secondary | ICD-10-CM | POA: Diagnosis not present

## 2022-02-03 DIAGNOSIS — R2689 Other abnormalities of gait and mobility: Secondary | ICD-10-CM | POA: Diagnosis not present

## 2022-02-03 DIAGNOSIS — F0280 Dementia in other diseases classified elsewhere without behavioral disturbance: Secondary | ICD-10-CM | POA: Diagnosis not present

## 2022-02-03 DIAGNOSIS — M6281 Muscle weakness (generalized): Secondary | ICD-10-CM | POA: Diagnosis not present

## 2022-02-03 DIAGNOSIS — R279 Unspecified lack of coordination: Secondary | ICD-10-CM | POA: Diagnosis not present

## 2022-02-05 DIAGNOSIS — M6281 Muscle weakness (generalized): Secondary | ICD-10-CM | POA: Diagnosis not present

## 2022-02-05 DIAGNOSIS — R279 Unspecified lack of coordination: Secondary | ICD-10-CM | POA: Diagnosis not present

## 2022-02-05 DIAGNOSIS — Z9181 History of falling: Secondary | ICD-10-CM | POA: Diagnosis not present

## 2022-02-05 DIAGNOSIS — F0280 Dementia in other diseases classified elsewhere without behavioral disturbance: Secondary | ICD-10-CM | POA: Diagnosis not present

## 2022-02-05 DIAGNOSIS — R2689 Other abnormalities of gait and mobility: Secondary | ICD-10-CM | POA: Diagnosis not present

## 2022-02-06 DIAGNOSIS — F0280 Dementia in other diseases classified elsewhere without behavioral disturbance: Secondary | ICD-10-CM | POA: Diagnosis not present

## 2022-02-06 DIAGNOSIS — M6281 Muscle weakness (generalized): Secondary | ICD-10-CM | POA: Diagnosis not present

## 2022-02-06 DIAGNOSIS — R279 Unspecified lack of coordination: Secondary | ICD-10-CM | POA: Diagnosis not present

## 2022-02-06 DIAGNOSIS — Z9181 History of falling: Secondary | ICD-10-CM | POA: Diagnosis not present

## 2022-02-06 DIAGNOSIS — R2689 Other abnormalities of gait and mobility: Secondary | ICD-10-CM | POA: Diagnosis not present

## 2022-02-09 DIAGNOSIS — R2689 Other abnormalities of gait and mobility: Secondary | ICD-10-CM | POA: Diagnosis not present

## 2022-02-09 DIAGNOSIS — M6281 Muscle weakness (generalized): Secondary | ICD-10-CM | POA: Diagnosis not present

## 2022-02-09 DIAGNOSIS — R279 Unspecified lack of coordination: Secondary | ICD-10-CM | POA: Diagnosis not present

## 2022-02-09 DIAGNOSIS — F0280 Dementia in other diseases classified elsewhere without behavioral disturbance: Secondary | ICD-10-CM | POA: Diagnosis not present

## 2022-02-09 DIAGNOSIS — Z9181 History of falling: Secondary | ICD-10-CM | POA: Diagnosis not present

## 2022-02-10 DIAGNOSIS — R2689 Other abnormalities of gait and mobility: Secondary | ICD-10-CM | POA: Diagnosis not present

## 2022-02-10 DIAGNOSIS — R279 Unspecified lack of coordination: Secondary | ICD-10-CM | POA: Diagnosis not present

## 2022-02-10 DIAGNOSIS — M6281 Muscle weakness (generalized): Secondary | ICD-10-CM | POA: Diagnosis not present

## 2022-02-10 DIAGNOSIS — F0280 Dementia in other diseases classified elsewhere without behavioral disturbance: Secondary | ICD-10-CM | POA: Diagnosis not present

## 2022-02-10 DIAGNOSIS — Z9181 History of falling: Secondary | ICD-10-CM | POA: Diagnosis not present

## 2022-02-11 DIAGNOSIS — F0280 Dementia in other diseases classified elsewhere without behavioral disturbance: Secondary | ICD-10-CM | POA: Diagnosis not present

## 2022-02-11 DIAGNOSIS — R2689 Other abnormalities of gait and mobility: Secondary | ICD-10-CM | POA: Diagnosis not present

## 2022-02-11 DIAGNOSIS — M6281 Muscle weakness (generalized): Secondary | ICD-10-CM | POA: Diagnosis not present

## 2022-02-11 DIAGNOSIS — R279 Unspecified lack of coordination: Secondary | ICD-10-CM | POA: Diagnosis not present

## 2022-02-11 DIAGNOSIS — Z9181 History of falling: Secondary | ICD-10-CM | POA: Diagnosis not present

## 2022-02-12 DIAGNOSIS — R2689 Other abnormalities of gait and mobility: Secondary | ICD-10-CM | POA: Diagnosis not present

## 2022-02-12 DIAGNOSIS — D2272 Melanocytic nevi of left lower limb, including hip: Secondary | ICD-10-CM | POA: Diagnosis not present

## 2022-02-12 DIAGNOSIS — Z85828 Personal history of other malignant neoplasm of skin: Secondary | ICD-10-CM | POA: Diagnosis not present

## 2022-02-12 DIAGNOSIS — F0280 Dementia in other diseases classified elsewhere without behavioral disturbance: Secondary | ICD-10-CM | POA: Diagnosis not present

## 2022-02-12 DIAGNOSIS — X32XXXA Exposure to sunlight, initial encounter: Secondary | ICD-10-CM | POA: Diagnosis not present

## 2022-02-12 DIAGNOSIS — D2262 Melanocytic nevi of left upper limb, including shoulder: Secondary | ICD-10-CM | POA: Diagnosis not present

## 2022-02-12 DIAGNOSIS — L570 Actinic keratosis: Secondary | ICD-10-CM | POA: Diagnosis not present

## 2022-02-12 DIAGNOSIS — D2261 Melanocytic nevi of right upper limb, including shoulder: Secondary | ICD-10-CM | POA: Diagnosis not present

## 2022-02-12 DIAGNOSIS — D225 Melanocytic nevi of trunk: Secondary | ICD-10-CM | POA: Diagnosis not present

## 2022-02-12 DIAGNOSIS — M6281 Muscle weakness (generalized): Secondary | ICD-10-CM | POA: Diagnosis not present

## 2022-02-12 DIAGNOSIS — Z9181 History of falling: Secondary | ICD-10-CM | POA: Diagnosis not present

## 2022-02-12 DIAGNOSIS — R279 Unspecified lack of coordination: Secondary | ICD-10-CM | POA: Diagnosis not present

## 2022-02-13 DIAGNOSIS — Z9181 History of falling: Secondary | ICD-10-CM | POA: Diagnosis not present

## 2022-02-13 DIAGNOSIS — F0280 Dementia in other diseases classified elsewhere without behavioral disturbance: Secondary | ICD-10-CM | POA: Diagnosis not present

## 2022-02-13 DIAGNOSIS — R279 Unspecified lack of coordination: Secondary | ICD-10-CM | POA: Diagnosis not present

## 2022-02-13 DIAGNOSIS — R2689 Other abnormalities of gait and mobility: Secondary | ICD-10-CM | POA: Diagnosis not present

## 2022-02-13 DIAGNOSIS — M6281 Muscle weakness (generalized): Secondary | ICD-10-CM | POA: Diagnosis not present

## 2022-02-16 DIAGNOSIS — M6281 Muscle weakness (generalized): Secondary | ICD-10-CM | POA: Diagnosis not present

## 2022-02-16 DIAGNOSIS — Z9181 History of falling: Secondary | ICD-10-CM | POA: Diagnosis not present

## 2022-02-16 DIAGNOSIS — R2689 Other abnormalities of gait and mobility: Secondary | ICD-10-CM | POA: Diagnosis not present

## 2022-02-16 DIAGNOSIS — F0280 Dementia in other diseases classified elsewhere without behavioral disturbance: Secondary | ICD-10-CM | POA: Diagnosis not present

## 2022-02-16 DIAGNOSIS — R279 Unspecified lack of coordination: Secondary | ICD-10-CM | POA: Diagnosis not present

## 2022-02-17 DIAGNOSIS — I7091 Generalized atherosclerosis: Secondary | ICD-10-CM | POA: Diagnosis not present

## 2022-02-17 DIAGNOSIS — B351 Tinea unguium: Secondary | ICD-10-CM | POA: Diagnosis not present

## 2022-02-18 DIAGNOSIS — R279 Unspecified lack of coordination: Secondary | ICD-10-CM | POA: Diagnosis not present

## 2022-02-18 DIAGNOSIS — F0280 Dementia in other diseases classified elsewhere without behavioral disturbance: Secondary | ICD-10-CM | POA: Diagnosis not present

## 2022-02-18 DIAGNOSIS — M6281 Muscle weakness (generalized): Secondary | ICD-10-CM | POA: Diagnosis not present

## 2022-02-18 DIAGNOSIS — Z9181 History of falling: Secondary | ICD-10-CM | POA: Diagnosis not present

## 2022-02-18 DIAGNOSIS — R2689 Other abnormalities of gait and mobility: Secondary | ICD-10-CM | POA: Diagnosis not present

## 2022-02-19 DIAGNOSIS — F0280 Dementia in other diseases classified elsewhere without behavioral disturbance: Secondary | ICD-10-CM | POA: Diagnosis not present

## 2022-02-19 DIAGNOSIS — M6281 Muscle weakness (generalized): Secondary | ICD-10-CM | POA: Diagnosis not present

## 2022-02-19 DIAGNOSIS — R2689 Other abnormalities of gait and mobility: Secondary | ICD-10-CM | POA: Diagnosis not present

## 2022-02-19 DIAGNOSIS — Z9181 History of falling: Secondary | ICD-10-CM | POA: Diagnosis not present

## 2022-02-19 DIAGNOSIS — R279 Unspecified lack of coordination: Secondary | ICD-10-CM | POA: Diagnosis not present

## 2022-02-20 DIAGNOSIS — M6281 Muscle weakness (generalized): Secondary | ICD-10-CM | POA: Diagnosis not present

## 2022-02-20 DIAGNOSIS — Z9181 History of falling: Secondary | ICD-10-CM | POA: Diagnosis not present

## 2022-02-20 DIAGNOSIS — F0280 Dementia in other diseases classified elsewhere without behavioral disturbance: Secondary | ICD-10-CM | POA: Diagnosis not present

## 2022-02-20 DIAGNOSIS — R2689 Other abnormalities of gait and mobility: Secondary | ICD-10-CM | POA: Diagnosis not present

## 2022-02-20 DIAGNOSIS — R279 Unspecified lack of coordination: Secondary | ICD-10-CM | POA: Diagnosis not present

## 2022-02-23 DIAGNOSIS — Z9181 History of falling: Secondary | ICD-10-CM | POA: Diagnosis not present

## 2022-02-23 DIAGNOSIS — F0280 Dementia in other diseases classified elsewhere without behavioral disturbance: Secondary | ICD-10-CM | POA: Diagnosis not present

## 2022-02-23 DIAGNOSIS — R279 Unspecified lack of coordination: Secondary | ICD-10-CM | POA: Diagnosis not present

## 2022-02-23 DIAGNOSIS — R2689 Other abnormalities of gait and mobility: Secondary | ICD-10-CM | POA: Diagnosis not present

## 2022-02-23 DIAGNOSIS — M6281 Muscle weakness (generalized): Secondary | ICD-10-CM | POA: Diagnosis not present

## 2022-02-24 DIAGNOSIS — F0280 Dementia in other diseases classified elsewhere without behavioral disturbance: Secondary | ICD-10-CM | POA: Diagnosis not present

## 2022-02-24 DIAGNOSIS — Z9181 History of falling: Secondary | ICD-10-CM | POA: Diagnosis not present

## 2022-02-24 DIAGNOSIS — R279 Unspecified lack of coordination: Secondary | ICD-10-CM | POA: Diagnosis not present

## 2022-02-24 DIAGNOSIS — R2689 Other abnormalities of gait and mobility: Secondary | ICD-10-CM | POA: Diagnosis not present

## 2022-02-24 DIAGNOSIS — M6281 Muscle weakness (generalized): Secondary | ICD-10-CM | POA: Diagnosis not present

## 2022-02-25 DIAGNOSIS — Z9181 History of falling: Secondary | ICD-10-CM | POA: Diagnosis not present

## 2022-02-25 DIAGNOSIS — R279 Unspecified lack of coordination: Secondary | ICD-10-CM | POA: Diagnosis not present

## 2022-02-25 DIAGNOSIS — R2689 Other abnormalities of gait and mobility: Secondary | ICD-10-CM | POA: Diagnosis not present

## 2022-02-25 DIAGNOSIS — M6281 Muscle weakness (generalized): Secondary | ICD-10-CM | POA: Diagnosis not present

## 2022-02-25 DIAGNOSIS — F0280 Dementia in other diseases classified elsewhere without behavioral disturbance: Secondary | ICD-10-CM | POA: Diagnosis not present

## 2022-02-26 DIAGNOSIS — R279 Unspecified lack of coordination: Secondary | ICD-10-CM | POA: Diagnosis not present

## 2022-02-26 DIAGNOSIS — R2689 Other abnormalities of gait and mobility: Secondary | ICD-10-CM | POA: Diagnosis not present

## 2022-02-26 DIAGNOSIS — F0280 Dementia in other diseases classified elsewhere without behavioral disturbance: Secondary | ICD-10-CM | POA: Diagnosis not present

## 2022-02-26 DIAGNOSIS — Z9181 History of falling: Secondary | ICD-10-CM | POA: Diagnosis not present

## 2022-02-26 DIAGNOSIS — M6281 Muscle weakness (generalized): Secondary | ICD-10-CM | POA: Diagnosis not present

## 2022-02-27 DIAGNOSIS — Z9181 History of falling: Secondary | ICD-10-CM | POA: Diagnosis not present

## 2022-02-27 DIAGNOSIS — R279 Unspecified lack of coordination: Secondary | ICD-10-CM | POA: Diagnosis not present

## 2022-02-27 DIAGNOSIS — F0280 Dementia in other diseases classified elsewhere without behavioral disturbance: Secondary | ICD-10-CM | POA: Diagnosis not present

## 2022-02-27 DIAGNOSIS — M6281 Muscle weakness (generalized): Secondary | ICD-10-CM | POA: Diagnosis not present

## 2022-02-27 DIAGNOSIS — R2689 Other abnormalities of gait and mobility: Secondary | ICD-10-CM | POA: Diagnosis not present

## 2022-03-02 DIAGNOSIS — R2689 Other abnormalities of gait and mobility: Secondary | ICD-10-CM | POA: Diagnosis not present

## 2022-03-02 DIAGNOSIS — F0280 Dementia in other diseases classified elsewhere without behavioral disturbance: Secondary | ICD-10-CM | POA: Diagnosis not present

## 2022-03-02 DIAGNOSIS — Z9181 History of falling: Secondary | ICD-10-CM | POA: Diagnosis not present

## 2022-03-02 DIAGNOSIS — R279 Unspecified lack of coordination: Secondary | ICD-10-CM | POA: Diagnosis not present

## 2022-03-02 DIAGNOSIS — M6281 Muscle weakness (generalized): Secondary | ICD-10-CM | POA: Diagnosis not present

## 2022-03-03 DIAGNOSIS — Z9181 History of falling: Secondary | ICD-10-CM | POA: Diagnosis not present

## 2022-03-03 DIAGNOSIS — M6281 Muscle weakness (generalized): Secondary | ICD-10-CM | POA: Diagnosis not present

## 2022-03-03 DIAGNOSIS — F0280 Dementia in other diseases classified elsewhere without behavioral disturbance: Secondary | ICD-10-CM | POA: Diagnosis not present

## 2022-03-03 DIAGNOSIS — R279 Unspecified lack of coordination: Secondary | ICD-10-CM | POA: Diagnosis not present

## 2022-03-03 DIAGNOSIS — R2689 Other abnormalities of gait and mobility: Secondary | ICD-10-CM | POA: Diagnosis not present

## 2022-03-05 DIAGNOSIS — F0280 Dementia in other diseases classified elsewhere without behavioral disturbance: Secondary | ICD-10-CM | POA: Diagnosis not present

## 2022-03-05 DIAGNOSIS — R2689 Other abnormalities of gait and mobility: Secondary | ICD-10-CM | POA: Diagnosis not present

## 2022-03-05 DIAGNOSIS — R279 Unspecified lack of coordination: Secondary | ICD-10-CM | POA: Diagnosis not present

## 2022-03-05 DIAGNOSIS — Z9181 History of falling: Secondary | ICD-10-CM | POA: Diagnosis not present

## 2022-03-05 DIAGNOSIS — M6281 Muscle weakness (generalized): Secondary | ICD-10-CM | POA: Diagnosis not present

## 2022-03-06 DIAGNOSIS — R2689 Other abnormalities of gait and mobility: Secondary | ICD-10-CM | POA: Diagnosis not present

## 2022-03-06 DIAGNOSIS — R279 Unspecified lack of coordination: Secondary | ICD-10-CM | POA: Diagnosis not present

## 2022-03-06 DIAGNOSIS — M6281 Muscle weakness (generalized): Secondary | ICD-10-CM | POA: Diagnosis not present

## 2022-03-06 DIAGNOSIS — F0280 Dementia in other diseases classified elsewhere without behavioral disturbance: Secondary | ICD-10-CM | POA: Diagnosis not present

## 2022-03-06 DIAGNOSIS — Z9181 History of falling: Secondary | ICD-10-CM | POA: Diagnosis not present

## 2022-03-09 DIAGNOSIS — Z9181 History of falling: Secondary | ICD-10-CM | POA: Diagnosis not present

## 2022-03-09 DIAGNOSIS — M6281 Muscle weakness (generalized): Secondary | ICD-10-CM | POA: Diagnosis not present

## 2022-03-09 DIAGNOSIS — R279 Unspecified lack of coordination: Secondary | ICD-10-CM | POA: Diagnosis not present

## 2022-03-09 DIAGNOSIS — R2689 Other abnormalities of gait and mobility: Secondary | ICD-10-CM | POA: Diagnosis not present

## 2022-03-09 DIAGNOSIS — F0280 Dementia in other diseases classified elsewhere without behavioral disturbance: Secondary | ICD-10-CM | POA: Diagnosis not present

## 2022-03-10 DIAGNOSIS — R2689 Other abnormalities of gait and mobility: Secondary | ICD-10-CM | POA: Diagnosis not present

## 2022-03-10 DIAGNOSIS — Z9181 History of falling: Secondary | ICD-10-CM | POA: Diagnosis not present

## 2022-03-10 DIAGNOSIS — M6281 Muscle weakness (generalized): Secondary | ICD-10-CM | POA: Diagnosis not present

## 2022-03-10 DIAGNOSIS — F0280 Dementia in other diseases classified elsewhere without behavioral disturbance: Secondary | ICD-10-CM | POA: Diagnosis not present

## 2022-03-10 DIAGNOSIS — R279 Unspecified lack of coordination: Secondary | ICD-10-CM | POA: Diagnosis not present

## 2022-03-12 DIAGNOSIS — R279 Unspecified lack of coordination: Secondary | ICD-10-CM | POA: Diagnosis not present

## 2022-03-12 DIAGNOSIS — M6281 Muscle weakness (generalized): Secondary | ICD-10-CM | POA: Diagnosis not present

## 2022-03-12 DIAGNOSIS — F0280 Dementia in other diseases classified elsewhere without behavioral disturbance: Secondary | ICD-10-CM | POA: Diagnosis not present

## 2022-03-12 DIAGNOSIS — R2689 Other abnormalities of gait and mobility: Secondary | ICD-10-CM | POA: Diagnosis not present

## 2022-03-12 DIAGNOSIS — Z9181 History of falling: Secondary | ICD-10-CM | POA: Diagnosis not present

## 2022-03-13 DIAGNOSIS — F0280 Dementia in other diseases classified elsewhere without behavioral disturbance: Secondary | ICD-10-CM | POA: Diagnosis not present

## 2022-03-13 DIAGNOSIS — M6281 Muscle weakness (generalized): Secondary | ICD-10-CM | POA: Diagnosis not present

## 2022-03-13 DIAGNOSIS — R279 Unspecified lack of coordination: Secondary | ICD-10-CM | POA: Diagnosis not present

## 2022-03-13 DIAGNOSIS — R2689 Other abnormalities of gait and mobility: Secondary | ICD-10-CM | POA: Diagnosis not present

## 2022-03-13 DIAGNOSIS — Z9181 History of falling: Secondary | ICD-10-CM | POA: Diagnosis not present

## 2022-03-16 DIAGNOSIS — R2689 Other abnormalities of gait and mobility: Secondary | ICD-10-CM | POA: Diagnosis not present

## 2022-03-16 DIAGNOSIS — R279 Unspecified lack of coordination: Secondary | ICD-10-CM | POA: Diagnosis not present

## 2022-03-16 DIAGNOSIS — F0280 Dementia in other diseases classified elsewhere without behavioral disturbance: Secondary | ICD-10-CM | POA: Diagnosis not present

## 2022-03-16 DIAGNOSIS — M6281 Muscle weakness (generalized): Secondary | ICD-10-CM | POA: Diagnosis not present

## 2022-03-16 DIAGNOSIS — Z9181 History of falling: Secondary | ICD-10-CM | POA: Diagnosis not present

## 2022-03-17 DIAGNOSIS — Z9181 History of falling: Secondary | ICD-10-CM | POA: Diagnosis not present

## 2022-03-17 DIAGNOSIS — R2689 Other abnormalities of gait and mobility: Secondary | ICD-10-CM | POA: Diagnosis not present

## 2022-03-17 DIAGNOSIS — M6281 Muscle weakness (generalized): Secondary | ICD-10-CM | POA: Diagnosis not present

## 2022-03-17 DIAGNOSIS — R279 Unspecified lack of coordination: Secondary | ICD-10-CM | POA: Diagnosis not present

## 2022-03-17 DIAGNOSIS — F0280 Dementia in other diseases classified elsewhere without behavioral disturbance: Secondary | ICD-10-CM | POA: Diagnosis not present

## 2022-03-18 DIAGNOSIS — M6281 Muscle weakness (generalized): Secondary | ICD-10-CM | POA: Diagnosis not present

## 2022-03-18 DIAGNOSIS — R2689 Other abnormalities of gait and mobility: Secondary | ICD-10-CM | POA: Diagnosis not present

## 2022-03-18 DIAGNOSIS — F0280 Dementia in other diseases classified elsewhere without behavioral disturbance: Secondary | ICD-10-CM | POA: Diagnosis not present

## 2022-03-18 DIAGNOSIS — Z9181 History of falling: Secondary | ICD-10-CM | POA: Diagnosis not present

## 2022-03-18 DIAGNOSIS — R279 Unspecified lack of coordination: Secondary | ICD-10-CM | POA: Diagnosis not present

## 2022-03-19 DIAGNOSIS — R2689 Other abnormalities of gait and mobility: Secondary | ICD-10-CM | POA: Diagnosis not present

## 2022-03-19 DIAGNOSIS — R279 Unspecified lack of coordination: Secondary | ICD-10-CM | POA: Diagnosis not present

## 2022-03-19 DIAGNOSIS — F0280 Dementia in other diseases classified elsewhere without behavioral disturbance: Secondary | ICD-10-CM | POA: Diagnosis not present

## 2022-03-19 DIAGNOSIS — M6281 Muscle weakness (generalized): Secondary | ICD-10-CM | POA: Diagnosis not present

## 2022-03-19 DIAGNOSIS — Z9181 History of falling: Secondary | ICD-10-CM | POA: Diagnosis not present

## 2022-03-20 DIAGNOSIS — F0280 Dementia in other diseases classified elsewhere without behavioral disturbance: Secondary | ICD-10-CM | POA: Diagnosis not present

## 2022-03-20 DIAGNOSIS — R279 Unspecified lack of coordination: Secondary | ICD-10-CM | POA: Diagnosis not present

## 2022-03-20 DIAGNOSIS — M6281 Muscle weakness (generalized): Secondary | ICD-10-CM | POA: Diagnosis not present

## 2022-03-20 DIAGNOSIS — Z9181 History of falling: Secondary | ICD-10-CM | POA: Diagnosis not present

## 2022-03-20 DIAGNOSIS — R2689 Other abnormalities of gait and mobility: Secondary | ICD-10-CM | POA: Diagnosis not present

## 2022-03-24 DIAGNOSIS — R2689 Other abnormalities of gait and mobility: Secondary | ICD-10-CM | POA: Diagnosis not present

## 2022-03-24 DIAGNOSIS — M6281 Muscle weakness (generalized): Secondary | ICD-10-CM | POA: Diagnosis not present

## 2022-03-24 DIAGNOSIS — R279 Unspecified lack of coordination: Secondary | ICD-10-CM | POA: Diagnosis not present

## 2022-03-24 DIAGNOSIS — F0280 Dementia in other diseases classified elsewhere without behavioral disturbance: Secondary | ICD-10-CM | POA: Diagnosis not present

## 2022-03-24 DIAGNOSIS — Z9181 History of falling: Secondary | ICD-10-CM | POA: Diagnosis not present

## 2022-03-25 DIAGNOSIS — R2689 Other abnormalities of gait and mobility: Secondary | ICD-10-CM | POA: Diagnosis not present

## 2022-03-25 DIAGNOSIS — M6281 Muscle weakness (generalized): Secondary | ICD-10-CM | POA: Diagnosis not present

## 2022-03-25 DIAGNOSIS — Z9181 History of falling: Secondary | ICD-10-CM | POA: Diagnosis not present

## 2022-03-25 DIAGNOSIS — F0280 Dementia in other diseases classified elsewhere without behavioral disturbance: Secondary | ICD-10-CM | POA: Diagnosis not present

## 2022-03-25 DIAGNOSIS — R279 Unspecified lack of coordination: Secondary | ICD-10-CM | POA: Diagnosis not present

## 2022-03-26 DIAGNOSIS — M6281 Muscle weakness (generalized): Secondary | ICD-10-CM | POA: Diagnosis not present

## 2022-03-26 DIAGNOSIS — R2689 Other abnormalities of gait and mobility: Secondary | ICD-10-CM | POA: Diagnosis not present

## 2022-03-26 DIAGNOSIS — R279 Unspecified lack of coordination: Secondary | ICD-10-CM | POA: Diagnosis not present

## 2022-03-26 DIAGNOSIS — F0280 Dementia in other diseases classified elsewhere without behavioral disturbance: Secondary | ICD-10-CM | POA: Diagnosis not present

## 2022-03-26 DIAGNOSIS — Z9181 History of falling: Secondary | ICD-10-CM | POA: Diagnosis not present

## 2022-03-27 DIAGNOSIS — R279 Unspecified lack of coordination: Secondary | ICD-10-CM | POA: Diagnosis not present

## 2022-03-27 DIAGNOSIS — F0280 Dementia in other diseases classified elsewhere without behavioral disturbance: Secondary | ICD-10-CM | POA: Diagnosis not present

## 2022-03-27 DIAGNOSIS — M6281 Muscle weakness (generalized): Secondary | ICD-10-CM | POA: Diagnosis not present

## 2022-03-27 DIAGNOSIS — M5412 Radiculopathy, cervical region: Secondary | ICD-10-CM | POA: Diagnosis not present

## 2022-03-27 DIAGNOSIS — G250 Essential tremor: Secondary | ICD-10-CM | POA: Diagnosis not present

## 2022-03-27 DIAGNOSIS — F325 Major depressive disorder, single episode, in full remission: Secondary | ICD-10-CM | POA: Diagnosis not present

## 2022-03-27 DIAGNOSIS — Z9181 History of falling: Secondary | ICD-10-CM | POA: Diagnosis not present

## 2022-03-27 DIAGNOSIS — F0390 Unspecified dementia without behavioral disturbance: Secondary | ICD-10-CM | POA: Diagnosis not present

## 2022-03-27 DIAGNOSIS — R2689 Other abnormalities of gait and mobility: Secondary | ICD-10-CM | POA: Diagnosis not present

## 2022-03-27 DIAGNOSIS — I4891 Unspecified atrial fibrillation: Secondary | ICD-10-CM | POA: Diagnosis not present

## 2022-03-27 DIAGNOSIS — I10 Essential (primary) hypertension: Secondary | ICD-10-CM | POA: Diagnosis not present

## 2022-03-30 DIAGNOSIS — Z9181 History of falling: Secondary | ICD-10-CM | POA: Diagnosis not present

## 2022-03-30 DIAGNOSIS — M6281 Muscle weakness (generalized): Secondary | ICD-10-CM | POA: Diagnosis not present

## 2022-03-30 DIAGNOSIS — F0390 Unspecified dementia without behavioral disturbance: Secondary | ICD-10-CM | POA: Diagnosis not present

## 2022-03-30 DIAGNOSIS — R279 Unspecified lack of coordination: Secondary | ICD-10-CM | POA: Diagnosis not present

## 2022-03-30 DIAGNOSIS — R2689 Other abnormalities of gait and mobility: Secondary | ICD-10-CM | POA: Diagnosis not present

## 2022-03-30 DIAGNOSIS — F0280 Dementia in other diseases classified elsewhere without behavioral disturbance: Secondary | ICD-10-CM | POA: Diagnosis not present

## 2022-03-31 DIAGNOSIS — M6281 Muscle weakness (generalized): Secondary | ICD-10-CM | POA: Diagnosis not present

## 2022-03-31 DIAGNOSIS — Z9181 History of falling: Secondary | ICD-10-CM | POA: Diagnosis not present

## 2022-03-31 DIAGNOSIS — R279 Unspecified lack of coordination: Secondary | ICD-10-CM | POA: Diagnosis not present

## 2022-03-31 DIAGNOSIS — F0390 Unspecified dementia without behavioral disturbance: Secondary | ICD-10-CM | POA: Diagnosis not present

## 2022-03-31 DIAGNOSIS — F0280 Dementia in other diseases classified elsewhere without behavioral disturbance: Secondary | ICD-10-CM | POA: Diagnosis not present

## 2022-03-31 DIAGNOSIS — R2689 Other abnormalities of gait and mobility: Secondary | ICD-10-CM | POA: Diagnosis not present

## 2022-04-01 DIAGNOSIS — R2689 Other abnormalities of gait and mobility: Secondary | ICD-10-CM | POA: Diagnosis not present

## 2022-04-01 DIAGNOSIS — R279 Unspecified lack of coordination: Secondary | ICD-10-CM | POA: Diagnosis not present

## 2022-04-01 DIAGNOSIS — F0390 Unspecified dementia without behavioral disturbance: Secondary | ICD-10-CM | POA: Diagnosis not present

## 2022-04-01 DIAGNOSIS — M6281 Muscle weakness (generalized): Secondary | ICD-10-CM | POA: Diagnosis not present

## 2022-04-01 DIAGNOSIS — Z9181 History of falling: Secondary | ICD-10-CM | POA: Diagnosis not present

## 2022-04-01 DIAGNOSIS — F0280 Dementia in other diseases classified elsewhere without behavioral disturbance: Secondary | ICD-10-CM | POA: Diagnosis not present

## 2022-04-02 DIAGNOSIS — F0390 Unspecified dementia without behavioral disturbance: Secondary | ICD-10-CM | POA: Diagnosis not present

## 2022-04-02 DIAGNOSIS — R2689 Other abnormalities of gait and mobility: Secondary | ICD-10-CM | POA: Diagnosis not present

## 2022-04-02 DIAGNOSIS — R279 Unspecified lack of coordination: Secondary | ICD-10-CM | POA: Diagnosis not present

## 2022-04-02 DIAGNOSIS — Z86711 Personal history of pulmonary embolism: Secondary | ICD-10-CM | POA: Diagnosis not present

## 2022-04-02 DIAGNOSIS — M6281 Muscle weakness (generalized): Secondary | ICD-10-CM | POA: Diagnosis not present

## 2022-04-02 DIAGNOSIS — F0280 Dementia in other diseases classified elsewhere without behavioral disturbance: Secondary | ICD-10-CM | POA: Diagnosis not present

## 2022-04-02 DIAGNOSIS — Z9181 History of falling: Secondary | ICD-10-CM | POA: Diagnosis not present

## 2022-04-02 DIAGNOSIS — Z0000 Encounter for general adult medical examination without abnormal findings: Secondary | ICD-10-CM | POA: Diagnosis not present

## 2022-04-02 DIAGNOSIS — R32 Unspecified urinary incontinence: Secondary | ICD-10-CM | POA: Diagnosis not present

## 2022-04-03 DIAGNOSIS — R279 Unspecified lack of coordination: Secondary | ICD-10-CM | POA: Diagnosis not present

## 2022-04-03 DIAGNOSIS — Z9181 History of falling: Secondary | ICD-10-CM | POA: Diagnosis not present

## 2022-04-03 DIAGNOSIS — M6281 Muscle weakness (generalized): Secondary | ICD-10-CM | POA: Diagnosis not present

## 2022-04-03 DIAGNOSIS — R2689 Other abnormalities of gait and mobility: Secondary | ICD-10-CM | POA: Diagnosis not present

## 2022-04-03 DIAGNOSIS — F0280 Dementia in other diseases classified elsewhere without behavioral disturbance: Secondary | ICD-10-CM | POA: Diagnosis not present

## 2022-04-03 DIAGNOSIS — F0390 Unspecified dementia without behavioral disturbance: Secondary | ICD-10-CM | POA: Diagnosis not present

## 2022-04-06 DIAGNOSIS — M6281 Muscle weakness (generalized): Secondary | ICD-10-CM | POA: Diagnosis not present

## 2022-04-06 DIAGNOSIS — Z9181 History of falling: Secondary | ICD-10-CM | POA: Diagnosis not present

## 2022-04-06 DIAGNOSIS — R279 Unspecified lack of coordination: Secondary | ICD-10-CM | POA: Diagnosis not present

## 2022-04-06 DIAGNOSIS — F0390 Unspecified dementia without behavioral disturbance: Secondary | ICD-10-CM | POA: Diagnosis not present

## 2022-04-06 DIAGNOSIS — F0280 Dementia in other diseases classified elsewhere without behavioral disturbance: Secondary | ICD-10-CM | POA: Diagnosis not present

## 2022-04-06 DIAGNOSIS — R2689 Other abnormalities of gait and mobility: Secondary | ICD-10-CM | POA: Diagnosis not present

## 2022-04-07 DIAGNOSIS — M6281 Muscle weakness (generalized): Secondary | ICD-10-CM | POA: Diagnosis not present

## 2022-04-07 DIAGNOSIS — Z9181 History of falling: Secondary | ICD-10-CM | POA: Diagnosis not present

## 2022-04-07 DIAGNOSIS — F0390 Unspecified dementia without behavioral disturbance: Secondary | ICD-10-CM | POA: Diagnosis not present

## 2022-04-07 DIAGNOSIS — R279 Unspecified lack of coordination: Secondary | ICD-10-CM | POA: Diagnosis not present

## 2022-04-07 DIAGNOSIS — F0280 Dementia in other diseases classified elsewhere without behavioral disturbance: Secondary | ICD-10-CM | POA: Diagnosis not present

## 2022-04-07 DIAGNOSIS — R2689 Other abnormalities of gait and mobility: Secondary | ICD-10-CM | POA: Diagnosis not present

## 2022-04-09 DIAGNOSIS — F0390 Unspecified dementia without behavioral disturbance: Secondary | ICD-10-CM | POA: Diagnosis not present

## 2022-04-09 DIAGNOSIS — M6281 Muscle weakness (generalized): Secondary | ICD-10-CM | POA: Diagnosis not present

## 2022-04-09 DIAGNOSIS — R279 Unspecified lack of coordination: Secondary | ICD-10-CM | POA: Diagnosis not present

## 2022-04-09 DIAGNOSIS — R2689 Other abnormalities of gait and mobility: Secondary | ICD-10-CM | POA: Diagnosis not present

## 2022-04-09 DIAGNOSIS — Z9181 History of falling: Secondary | ICD-10-CM | POA: Diagnosis not present

## 2022-04-09 DIAGNOSIS — F0280 Dementia in other diseases classified elsewhere without behavioral disturbance: Secondary | ICD-10-CM | POA: Diagnosis not present

## 2022-04-10 DIAGNOSIS — Z9181 History of falling: Secondary | ICD-10-CM | POA: Diagnosis not present

## 2022-04-10 DIAGNOSIS — F0390 Unspecified dementia without behavioral disturbance: Secondary | ICD-10-CM | POA: Diagnosis not present

## 2022-04-10 DIAGNOSIS — R279 Unspecified lack of coordination: Secondary | ICD-10-CM | POA: Diagnosis not present

## 2022-04-10 DIAGNOSIS — F0280 Dementia in other diseases classified elsewhere without behavioral disturbance: Secondary | ICD-10-CM | POA: Diagnosis not present

## 2022-04-10 DIAGNOSIS — M6281 Muscle weakness (generalized): Secondary | ICD-10-CM | POA: Diagnosis not present

## 2022-04-10 DIAGNOSIS — R2689 Other abnormalities of gait and mobility: Secondary | ICD-10-CM | POA: Diagnosis not present

## 2022-04-13 DIAGNOSIS — F0390 Unspecified dementia without behavioral disturbance: Secondary | ICD-10-CM | POA: Diagnosis not present

## 2022-04-13 DIAGNOSIS — R2689 Other abnormalities of gait and mobility: Secondary | ICD-10-CM | POA: Diagnosis not present

## 2022-04-13 DIAGNOSIS — R279 Unspecified lack of coordination: Secondary | ICD-10-CM | POA: Diagnosis not present

## 2022-04-13 DIAGNOSIS — F0280 Dementia in other diseases classified elsewhere without behavioral disturbance: Secondary | ICD-10-CM | POA: Diagnosis not present

## 2022-04-13 DIAGNOSIS — Z9181 History of falling: Secondary | ICD-10-CM | POA: Diagnosis not present

## 2022-04-13 DIAGNOSIS — M6281 Muscle weakness (generalized): Secondary | ICD-10-CM | POA: Diagnosis not present

## 2022-04-14 DIAGNOSIS — M6281 Muscle weakness (generalized): Secondary | ICD-10-CM | POA: Diagnosis not present

## 2022-04-14 DIAGNOSIS — F0390 Unspecified dementia without behavioral disturbance: Secondary | ICD-10-CM | POA: Diagnosis not present

## 2022-04-14 DIAGNOSIS — R279 Unspecified lack of coordination: Secondary | ICD-10-CM | POA: Diagnosis not present

## 2022-04-14 DIAGNOSIS — R2689 Other abnormalities of gait and mobility: Secondary | ICD-10-CM | POA: Diagnosis not present

## 2022-04-14 DIAGNOSIS — F0280 Dementia in other diseases classified elsewhere without behavioral disturbance: Secondary | ICD-10-CM | POA: Diagnosis not present

## 2022-04-14 DIAGNOSIS — Z9181 History of falling: Secondary | ICD-10-CM | POA: Diagnosis not present

## 2022-04-15 DIAGNOSIS — Z9181 History of falling: Secondary | ICD-10-CM | POA: Diagnosis not present

## 2022-04-15 DIAGNOSIS — M6281 Muscle weakness (generalized): Secondary | ICD-10-CM | POA: Diagnosis not present

## 2022-04-15 DIAGNOSIS — F0390 Unspecified dementia without behavioral disturbance: Secondary | ICD-10-CM | POA: Diagnosis not present

## 2022-04-15 DIAGNOSIS — R2689 Other abnormalities of gait and mobility: Secondary | ICD-10-CM | POA: Diagnosis not present

## 2022-04-15 DIAGNOSIS — R279 Unspecified lack of coordination: Secondary | ICD-10-CM | POA: Diagnosis not present

## 2022-04-15 DIAGNOSIS — F0280 Dementia in other diseases classified elsewhere without behavioral disturbance: Secondary | ICD-10-CM | POA: Diagnosis not present

## 2022-04-16 DIAGNOSIS — Z9181 History of falling: Secondary | ICD-10-CM | POA: Diagnosis not present

## 2022-04-16 DIAGNOSIS — F0280 Dementia in other diseases classified elsewhere without behavioral disturbance: Secondary | ICD-10-CM | POA: Diagnosis not present

## 2022-04-16 DIAGNOSIS — R2689 Other abnormalities of gait and mobility: Secondary | ICD-10-CM | POA: Diagnosis not present

## 2022-04-16 DIAGNOSIS — R279 Unspecified lack of coordination: Secondary | ICD-10-CM | POA: Diagnosis not present

## 2022-04-16 DIAGNOSIS — F0390 Unspecified dementia without behavioral disturbance: Secondary | ICD-10-CM | POA: Diagnosis not present

## 2022-04-16 DIAGNOSIS — M6281 Muscle weakness (generalized): Secondary | ICD-10-CM | POA: Diagnosis not present

## 2022-04-17 DIAGNOSIS — M6281 Muscle weakness (generalized): Secondary | ICD-10-CM | POA: Diagnosis not present

## 2022-04-17 DIAGNOSIS — F0390 Unspecified dementia without behavioral disturbance: Secondary | ICD-10-CM | POA: Diagnosis not present

## 2022-04-17 DIAGNOSIS — Z9181 History of falling: Secondary | ICD-10-CM | POA: Diagnosis not present

## 2022-04-17 DIAGNOSIS — R2689 Other abnormalities of gait and mobility: Secondary | ICD-10-CM | POA: Diagnosis not present

## 2022-04-17 DIAGNOSIS — F0280 Dementia in other diseases classified elsewhere without behavioral disturbance: Secondary | ICD-10-CM | POA: Diagnosis not present

## 2022-04-17 DIAGNOSIS — R279 Unspecified lack of coordination: Secondary | ICD-10-CM | POA: Diagnosis not present

## 2022-04-20 DIAGNOSIS — F0280 Dementia in other diseases classified elsewhere without behavioral disturbance: Secondary | ICD-10-CM | POA: Diagnosis not present

## 2022-04-20 DIAGNOSIS — R2689 Other abnormalities of gait and mobility: Secondary | ICD-10-CM | POA: Diagnosis not present

## 2022-04-20 DIAGNOSIS — R279 Unspecified lack of coordination: Secondary | ICD-10-CM | POA: Diagnosis not present

## 2022-04-20 DIAGNOSIS — M6281 Muscle weakness (generalized): Secondary | ICD-10-CM | POA: Diagnosis not present

## 2022-04-20 DIAGNOSIS — Z9181 History of falling: Secondary | ICD-10-CM | POA: Diagnosis not present

## 2022-04-20 DIAGNOSIS — F0390 Unspecified dementia without behavioral disturbance: Secondary | ICD-10-CM | POA: Diagnosis not present

## 2022-04-21 DIAGNOSIS — Z9181 History of falling: Secondary | ICD-10-CM | POA: Diagnosis not present

## 2022-04-21 DIAGNOSIS — M6281 Muscle weakness (generalized): Secondary | ICD-10-CM | POA: Diagnosis not present

## 2022-04-21 DIAGNOSIS — F0390 Unspecified dementia without behavioral disturbance: Secondary | ICD-10-CM | POA: Diagnosis not present

## 2022-04-21 DIAGNOSIS — F0280 Dementia in other diseases classified elsewhere without behavioral disturbance: Secondary | ICD-10-CM | POA: Diagnosis not present

## 2022-04-21 DIAGNOSIS — R279 Unspecified lack of coordination: Secondary | ICD-10-CM | POA: Diagnosis not present

## 2022-04-21 DIAGNOSIS — R2689 Other abnormalities of gait and mobility: Secondary | ICD-10-CM | POA: Diagnosis not present

## 2022-04-22 DIAGNOSIS — F0280 Dementia in other diseases classified elsewhere without behavioral disturbance: Secondary | ICD-10-CM | POA: Diagnosis not present

## 2022-04-22 DIAGNOSIS — R279 Unspecified lack of coordination: Secondary | ICD-10-CM | POA: Diagnosis not present

## 2022-04-22 DIAGNOSIS — M6281 Muscle weakness (generalized): Secondary | ICD-10-CM | POA: Diagnosis not present

## 2022-04-22 DIAGNOSIS — F0390 Unspecified dementia without behavioral disturbance: Secondary | ICD-10-CM | POA: Diagnosis not present

## 2022-04-22 DIAGNOSIS — R2689 Other abnormalities of gait and mobility: Secondary | ICD-10-CM | POA: Diagnosis not present

## 2022-04-22 DIAGNOSIS — Z9181 History of falling: Secondary | ICD-10-CM | POA: Diagnosis not present

## 2022-04-23 DIAGNOSIS — M6281 Muscle weakness (generalized): Secondary | ICD-10-CM | POA: Diagnosis not present

## 2022-04-23 DIAGNOSIS — F0280 Dementia in other diseases classified elsewhere without behavioral disturbance: Secondary | ICD-10-CM | POA: Diagnosis not present

## 2022-04-23 DIAGNOSIS — Z9181 History of falling: Secondary | ICD-10-CM | POA: Diagnosis not present

## 2022-04-23 DIAGNOSIS — F0390 Unspecified dementia without behavioral disturbance: Secondary | ICD-10-CM | POA: Diagnosis not present

## 2022-04-23 DIAGNOSIS — R2689 Other abnormalities of gait and mobility: Secondary | ICD-10-CM | POA: Diagnosis not present

## 2022-04-23 DIAGNOSIS — R279 Unspecified lack of coordination: Secondary | ICD-10-CM | POA: Diagnosis not present

## 2022-04-24 DIAGNOSIS — R2689 Other abnormalities of gait and mobility: Secondary | ICD-10-CM | POA: Diagnosis not present

## 2022-04-24 DIAGNOSIS — Z9181 History of falling: Secondary | ICD-10-CM | POA: Diagnosis not present

## 2022-04-24 DIAGNOSIS — M6281 Muscle weakness (generalized): Secondary | ICD-10-CM | POA: Diagnosis not present

## 2022-04-24 DIAGNOSIS — F0280 Dementia in other diseases classified elsewhere without behavioral disturbance: Secondary | ICD-10-CM | POA: Diagnosis not present

## 2022-04-24 DIAGNOSIS — F0390 Unspecified dementia without behavioral disturbance: Secondary | ICD-10-CM | POA: Diagnosis not present

## 2022-04-24 DIAGNOSIS — R279 Unspecified lack of coordination: Secondary | ICD-10-CM | POA: Diagnosis not present

## 2022-04-26 ENCOUNTER — Other Ambulatory Visit: Payer: Self-pay

## 2022-04-26 ENCOUNTER — Emergency Department
Admission: EM | Admit: 2022-04-26 | Discharge: 2022-04-26 | Disposition: A | Discharge status: Skilled Nursing Facility | Payer: Medicare Other | Attending: Emergency Medicine | Admitting: Emergency Medicine

## 2022-04-26 ENCOUNTER — Emergency Department: Payer: Medicare Other

## 2022-04-26 DIAGNOSIS — Z96651 Presence of right artificial knee joint: Secondary | ICD-10-CM | POA: Insufficient documentation

## 2022-04-26 DIAGNOSIS — Z043 Encounter for examination and observation following other accident: Secondary | ICD-10-CM | POA: Diagnosis not present

## 2022-04-26 DIAGNOSIS — F0390 Unspecified dementia without behavioral disturbance: Secondary | ICD-10-CM | POA: Insufficient documentation

## 2022-04-26 DIAGNOSIS — Y92129 Unspecified place in nursing home as the place of occurrence of the external cause: Secondary | ICD-10-CM | POA: Insufficient documentation

## 2022-04-26 DIAGNOSIS — Z7901 Long term (current) use of anticoagulants: Secondary | ICD-10-CM | POA: Insufficient documentation

## 2022-04-26 DIAGNOSIS — S51011A Laceration without foreign body of right elbow, initial encounter: Secondary | ICD-10-CM | POA: Insufficient documentation

## 2022-04-26 DIAGNOSIS — S59901A Unspecified injury of right elbow, initial encounter: Secondary | ICD-10-CM | POA: Diagnosis present

## 2022-04-26 DIAGNOSIS — Z853 Personal history of malignant neoplasm of breast: Secondary | ICD-10-CM | POA: Insufficient documentation

## 2022-04-26 DIAGNOSIS — W19XXXA Unspecified fall, initial encounter: Secondary | ICD-10-CM | POA: Diagnosis not present

## 2022-04-26 DIAGNOSIS — N390 Urinary tract infection, site not specified: Secondary | ICD-10-CM

## 2022-04-26 DIAGNOSIS — Z79899 Other long term (current) drug therapy: Secondary | ICD-10-CM | POA: Insufficient documentation

## 2022-04-26 DIAGNOSIS — S0990XA Unspecified injury of head, initial encounter: Secondary | ICD-10-CM | POA: Insufficient documentation

## 2022-04-26 DIAGNOSIS — I10 Essential (primary) hypertension: Secondary | ICD-10-CM | POA: Diagnosis not present

## 2022-04-26 DIAGNOSIS — W01198A Fall on same level from slipping, tripping and stumbling with subsequent striking against other object, initial encounter: Secondary | ICD-10-CM | POA: Diagnosis not present

## 2022-04-26 DIAGNOSIS — M4319 Spondylolisthesis, multiple sites in spine: Secondary | ICD-10-CM | POA: Diagnosis not present

## 2022-04-26 LAB — URINALYSIS, ROUTINE W REFLEX MICROSCOPIC
Bilirubin Urine: NEGATIVE
Glucose, UA: NEGATIVE mg/dL
Hgb urine dipstick: NEGATIVE
Ketones, ur: NEGATIVE mg/dL
Nitrite: POSITIVE — AB
Protein, ur: NEGATIVE mg/dL
Specific Gravity, Urine: 1.014 (ref 1.005–1.030)
WBC, UA: >50 WBC/hpf — ABNORMAL HIGH (ref 0–5)
pH: 6 (ref 5.0–8.0)

## 2022-04-26 MED ORDER — CEPHALEXIN 250 MG PO CAPS: 250.0000 mg | ORAL_CAPSULE | Freq: Three times a day (TID) | ORAL | 0 refills | Status: DC

## 2022-04-26 MED ORDER — CEPHALEXIN 250 MG PO CAPS
250.0000 mg | ORAL_CAPSULE | Freq: Once | ORAL | Status: AC
Start: 2022-04-26 — End: 2022-04-26
  Administered 2022-04-26: 250 mg via ORAL
  Filled 2022-04-26: qty 1

## 2022-04-26 NOTE — ED Provider Notes (Signed)
[facility] Provider Note    Event Date/Time   First MD Initiated Contact with Patient 04/26/22 [phone]     (approximate)   History   Fall   HPI  [name] is a 88 y.o. female  who presents to the ED from home status post mechanical fall.  Patient was ambulating out of the restroom this morning using her rolling walker when it got stuck on the floor mat causing patient to lose her balance and fall.  She struck her head on the floor when she fell backwards.  Denies LOC.  Does take Eliquis.  Denies vision changes, neck pain, chest pain, shortness of breath, abdominal pain, nausea, vomiting or dizziness.  Presents with right elbow skin tear.     Past Medical History   Past Medical History:  Diagnosis Date  . A-fib ([place])   . Allergy   . Breast cancer ([place]) 07/22/2012   T1c, Nx carcinoma left breast, ER 90%, PR 90%, HER-2/neu not over expressed.  . Hypertension   . Insomnia   . Osteoporosis    Prolia 10/2012  . PAT (paroxysmal atrial tachycardia) ([place])    a. Dx 03/2019 - improved w/ beta blocker; b. 03/2019 Echo: EF 55-60%. Impaired relaxation. RVSP 48.62mHg. Mild to mod MR/TR.  .Marland KitchenPulmonary embolus ([place] 2009  . S/P IVC filter 2009     Active Problem List   Patient Active Problem List   Diagnosis Date Noted  . Age spots 04/08/2021  . Urinary frequency 12/31/2020  . Trapezius muscle spasm 12/31/2020  . Weight loss 05/14/2020  . [place] ([place] 08/15/2019  . Bruise 08/15/2019  . Ankle edema, bilateral 07/12/2019  . TIA (transient ischemic attack) 06/02/2019  . Atrial tachycardia 04/02/2019  . Postural dizziness with near syncope 03/31/2019  . [place] hemorrhage ([place] 03/21/2019  . Ankle tightness 01/04/2019  . Neck pain 12/13/2018  . Ear pain 12/13/2018  . Subconjunctival hemorrhage of left eye 02/13/2018  . Positional right arm tingling while sleeping 10/01/2017  . Anxiety 12/14/2016  . Nocturia 12/14/2016  . Varicose veins of both lower  extremities 09/11/2016  . Rash and nonspecific skin eruption 07/13/2016  . Dry eyes 06/11/2016  . Fall 03/11/2016  . Tremor 01/16/2016  . Seasonal allergic rhinitis 02/28/2015  . Diarrhea 01/15/2015  . Cold intolerance 11/26/2014  . Osteoarthritis of left knee 08/31/2014  . Gait instability 08/31/2014  . Memory loss 08/31/2014  . Seborrheic keratoses 12/28/2012  . Osteopenia 09/22/2012  . History of breast cancer 06/02/2012  . Depression 01/08/2012  . History of pulmonary embolus (PE) 12/03/2011  . Insomnia 05/13/2011  . Hypertension 05/13/2011     Past Surgical History   Past Surgical History:  Procedure Laterality Date  . BLADDER SURGERY    . BREAST SURGERY Left 2013   mastectomy  . CHOLECYSTECTOMY    . HERNIA REPAIR    . MASTECTOMY Left 2013   BREAST CA  . REPLACEMENT TOTAL KNEE     right  . TONSILLECTOMY       Home Medications   Prior to Admission medications   Medication Sig Start Date End Date Taking? Authorizing Provider  albuterol (PROVENTIL HFA;VENTOLIN HFA) 108 (90 Base) MCG/ACT inhaler Inhale 2 puffs into the lungs every 6 (six) hours as needed for wheezing or shortness of breath. 09/01/18   GJodelle Green FNP  ALPRAZolam (Duanne Moron 0.5 MG tablet Take 0.5 mg by mouth at bedtime as needed for anxiety.    [provider]  apixaban (ELIQUIS) 2.5 MG TABS tablet Take 2.5 mg by mouth 2 (two) times daily.    [provider]  baclofen (LIORESAL) 10 MG tablet Take 10 mg by mouth 3 (three) times daily.    [provider]  Cholecalciferol (VITAMIN D PO) Take 2,000 mg by mouth daily.    [provider]  cholestyramine light (PREVALITE) 4 g packet  05/14/20   [provider]  denosumab (PROLIA) 60 MG/ML SOSY injection Prolia 60 mg/mL subcutaneous syringe  Inject 1 mL by subcutaneous route.    [provider]  diphenoxylate-atropine (LOMOTIL) 2.5-0.025 MG tablet Take 1 tablet by mouth 4 (four) times daily as needed for  diarrhea or loose stools. 11/28/19   [name], MD  fluticasone (FLONASE) 50 MCG/ACT nasal spray Place 1 spray into both nostrils daily.    [provider]  loperamide (IMODIUM) 2 MG capsule Take 2 mg by mouth as needed for diarrhea or loose stools.    [provider]  losartan (COZAAR) 25 MG tablet TAKE 1 TABLET DAILY 09/30/20   [name], MD  metoprolol tartrate (LOPRESSOR) 50 MG tablet TAKE 1 TABLET TWICE A DAY 07/07/21   [name], MD  mirtazapine (REMERON) 7.5 MG tablet Take 1 tablet (7.5 mg total) by mouth at bedtime. 04/14/21   [name], MD  montelukast (SINGULAIR) 10 MG tablet  07/22/19   [provider]  triamcinolone cream (KENALOG) 0.1 % Apply 1 application topically 2 (two) times daily. For up to 7 days. Do not apply to the face. 04/19/19   [name], MD  Vibegron (GEMTESA) 75 MG TABS Take 75 mg by mouth daily. 07/27/21   [name], MD  XIIDRA 5 % SOLN Place 1-2 drops into both eyes daily.  12/06/17   [provider]     Allergies  Erythromycin and Sulfa drugs cross reactors   Family History   Family History  Problem Relation Age of Onset  . COPD Mother   . Stroke Father   . Breast cancer Maternal Aunt      Physical Exam  Triage Vital Signs: ED Triage Vitals [04/26/22 0425]  Enc Vitals Group     BP 138/74     Pulse Rate 74     Resp 14     Temp 98 F (36.7 C)     Temp Source Oral     SpO2 97 %     Weight 110 lb (49.9 kg)     Height 5' (1.524 m)     Head Circumference      Peak Flow      Pain Score 0     Pain Loc      Pain Edu?      Excl. in [place]?     Updated Vital Signs: BP (!) 146/62   Pulse 73   Temp 98 F (36.7 C) (Oral)   Resp 16   Ht 5' (1.524 m)   Wt 49.9 kg   SpO2 98%   BMI 21.48 kg/m    General: Awake, no distress.  CV:  RRR.  Good peripheral perfusion.  Resp:  Normal effort.  CTA B. Abd:  Nontender.  No distention.  Other:  Head is atraumatic.  Nose is  atraumatic.  No C-spine tenderness to palpation.  Pelvis is stable.  Full range of motion both hips without pain.  Small right elbow skin tear without active bleeding.  Full range of elbow without pain.  ED Results / Procedures / Treatments  Labs (all labs ordered are listed, but only abnormal results are displayed) Labs Reviewed  URINALYSIS, ROUTINE W REFLEX MICROSCOPIC - Abnormal; Notable for the following components:      Result Value   Color, Urine YELLOW (*)    APPearance HAZY (*)    Nitrite POSITIVE (*)    Leukocytes,Ua LARGE (*)    WBC, UA >50 (*)    Bacteria, UA MANY (*)    All other components within normal limits     EKG  None   RADIOLOGY I have independently visualized and interpreted patient's CT scans as well as noted the radiology interpretation:  CT head: No ICH  CT cervical spine: No acute osseous injury  Official radiology report(s): CT HEAD WO CONTRAST (5MM)  Result Date: 04/26/2022 CLINICAL DATA:  Fall with head injury EXAM: CT HEAD WITHOUT CONTRAST CT CERVICAL SPINE WITHOUT CONTRAST TECHNIQUE: Multidetector CT imaging of the head and cervical spine was performed following the standard protocol without intravenous contrast. Multiplanar CT image reconstructions of the cervical spine were also generated. RADIATION DOSE REDUCTION: This exam was performed according to the departmental dose-optimization program which includes automated exposure control, adjustment of the mA and/or kV according to patient size and/or use of iterative reconstruction technique. COMPARISON:  05/31/2021 FINDINGS: CT HEAD FINDINGS Brain: No evidence of acute infarction, hemorrhage, hydrocephalus, extra-axial collection or mass lesion/mass effect. Extensive chronic small vessel ischemia with gliosis in the deep white matter. Chronic lacunar infarcts in the left pons and right thalamus. Mild for age cerebral volume loss Vascular: No hyperdense vessel or unexpected calcification. Skull: No  acute fracture Sinuses/Orbits: Fluid levels in the left maxillary and sphenoid sinuses. Sinusitis was also seen on prior. Negative for hemosinus or visible orbit injury. CT CERVICAL SPINE FINDINGS Alignment: Anterolisthesis at C3-4, C4-5, and C7-T1-degenerative. Mild scoliotic curvature. Skull base and vertebrae: No acute fracture or incidental bone lesion Soft tissues and spinal canal: No prevertebral fluid or swelling. No visible canal hematoma. Disc levels: Advanced and generalized disc and facet degeneration with bulky facet spurring asymmetric to the left. Intervertebral ankylosis has occurred at C5-C7 and T1-T4 at least. Upper chest: No acute finding IMPRESSION: 1. No evidence of acute intracranial or cervical spine injury. 2. Notable chronic small vessel ischemia. Advanced chronic cervical spine degeneration. 3. Left maxillary and sphenoid sinus fluid levels, also seen in 2022. Electronically Signed   By: Jorje Guild M.D.   On: 04/26/2022 05:18   CT Cervical Spine Wo Contrast  Result Date: 04/26/2022 CLINICAL DATA:  Fall with head injury EXAM: CT HEAD WITHOUT CONTRAST CT CERVICAL SPINE WITHOUT CONTRAST TECHNIQUE: Multidetector CT imaging of the head and cervical spine was performed following the standard protocol without intravenous contrast. Multiplanar CT image reconstructions of the cervical spine were also generated. RADIATION DOSE REDUCTION: This exam was performed according to the departmental dose-optimization program which includes automated exposure control, adjustment of the mA and/or kV according to patient size and/or use of iterative reconstruction technique. COMPARISON:  05/31/2021 FINDINGS: CT HEAD FINDINGS Brain: No evidence of acute infarction, hemorrhage, hydrocephalus, extra-axial collection or mass lesion/mass effect. Extensive chronic small vessel ischemia with gliosis in the deep white matter. Chronic lacunar infarcts in the left pons and right thalamus. Mild for age cerebral  volume loss Vascular: No hyperdense vessel or unexpected calcification. Skull: No acute fracture Sinuses/Orbits: Fluid levels in the left maxillary and sphenoid sinuses. Sinusitis was also seen on prior. Negative for hemosinus or visible orbit injury.  CT CERVICAL SPINE FINDINGS Alignment: Anterolisthesis at C3-4, C4-5, and C7-T1-degenerative. Mild scoliotic curvature. Skull base and vertebrae: No acute fracture or incidental bone lesion Soft tissues and spinal canal: No prevertebral fluid or swelling. No visible canal hematoma. Disc levels: Advanced and generalized disc and facet degeneration with bulky facet spurring asymmetric to the left. Intervertebral ankylosis has occurred at C5-C7 and T1-T4 at least. Upper chest: No acute finding IMPRESSION: 1. No evidence of acute intracranial or cervical spine injury. 2. Notable chronic small vessel ischemia. Advanced chronic cervical spine degeneration. 3. Left maxillary and sphenoid sinus fluid levels, also seen in 2022. Electronically Signed   By: Jorje Guild M.D.   On: 04/26/2022 05:18     PROCEDURES:  Critical Care performed: No  Procedures   MEDICATIONS ORDERED IN ED: Medications - No data to display   IMPRESSION / MDM / [place] / ED COURSE  I reviewed the triage vital signs and the nursing notes.                             88 year old female presenting status post mechanical fall with right elbow skin tear and striking head, on Eliquis.  Differential diagnosis includes but is not limited to [place], SDH, cervical spine injury, elbow fracture, etc.  I have personally reviewed patient's records and note a home care visit on 04/02/2022 for dementia.  Patient's presentation is most consistent with acute complicated illness / injury requiring diagnostic workup.  CT head and cervical spine unremarkable for acute traumatic injury.  Right elbow skin tear without tenderness or deformity; low suspicion for fracture.  Will obtain urine specimen,  applied nonadherent dressing to skin tear.  Anticipate patient may be discharged home safely after return of urinalysis.  Strict return precautions given.  Patient and spouse verbalized understanding agree with plan of care.  Clinical Course as of 04/26/22 0349  [facility] Apr 26, 2022  0648 UA positive for UTI.  Will place on Keflex and patient will follow-up closely with her PCP.  Strict return precautions given.  Patient and spouse verbalized understanding agree with plan of care. [JS]    Clinical Course User Index [JS] Paulette Blanch, MD     FINAL CLINICAL IMPRESSION(S) / ED DIAGNOSES   Final diagnoses:  Fall, initial encounter  Skin tear of elbow without complication, right, initial encounter     Rx / DC Orders   ED Discharge Orders     None        Note:  This document was prepared using Dragon voice recognition software and may include unintentional dictation errors.   Paulette Blanch, MD 04/26/22 [id number]

## 2022-04-26 NOTE — Discharge Instructions (Addendum)
Take antibiotic as prescribed for UTI.  Return to the ER for worsening symptoms, persistent vomiting, lethargy, purulent discharge from wound or other concerns.

## 2022-04-26 NOTE — ED Notes (Signed)
Pt brought to ed rm 18 at this time, this RN now assuming care.  

## 2022-04-26 NOTE — ED Triage Notes (Addendum)
Pt fell while ambulating out of restroom this morning. Pt states she hit her right forearm and elbow on floor, pt states she also struck her head on floor when she fell backwards, pt denies neck pain. Pt denies pain in right arm, but skin tear noted to posterior right elbow.

## 2022-04-27 DIAGNOSIS — Z9181 History of falling: Secondary | ICD-10-CM | POA: Diagnosis not present

## 2022-04-27 DIAGNOSIS — M6281 Muscle weakness (generalized): Secondary | ICD-10-CM | POA: Diagnosis not present

## 2022-04-27 DIAGNOSIS — R279 Unspecified lack of coordination: Secondary | ICD-10-CM | POA: Diagnosis not present

## 2022-04-27 DIAGNOSIS — R2689 Other abnormalities of gait and mobility: Secondary | ICD-10-CM | POA: Diagnosis not present

## 2022-04-27 DIAGNOSIS — F0390 Unspecified dementia without behavioral disturbance: Secondary | ICD-10-CM | POA: Diagnosis not present

## 2022-04-27 DIAGNOSIS — F0280 Dementia in other diseases classified elsewhere without behavioral disturbance: Secondary | ICD-10-CM | POA: Diagnosis not present

## 2022-04-28 DIAGNOSIS — Z9181 History of falling: Secondary | ICD-10-CM | POA: Diagnosis not present

## 2022-04-28 DIAGNOSIS — M6281 Muscle weakness (generalized): Secondary | ICD-10-CM | POA: Diagnosis not present

## 2022-04-28 DIAGNOSIS — R2689 Other abnormalities of gait and mobility: Secondary | ICD-10-CM | POA: Diagnosis not present

## 2022-04-28 DIAGNOSIS — F0280 Dementia in other diseases classified elsewhere without behavioral disturbance: Secondary | ICD-10-CM | POA: Diagnosis not present

## 2022-04-28 DIAGNOSIS — R279 Unspecified lack of coordination: Secondary | ICD-10-CM | POA: Diagnosis not present

## 2022-04-28 DIAGNOSIS — F0390 Unspecified dementia without behavioral disturbance: Secondary | ICD-10-CM | POA: Diagnosis not present

## 2022-04-28 LAB — URINE CULTURE: Culture: >=100000 — AB

## 2022-04-29 DIAGNOSIS — R279 Unspecified lack of coordination: Secondary | ICD-10-CM | POA: Diagnosis not present

## 2022-04-29 DIAGNOSIS — M6281 Muscle weakness (generalized): Secondary | ICD-10-CM | POA: Diagnosis not present

## 2022-04-29 DIAGNOSIS — Z9181 History of falling: Secondary | ICD-10-CM | POA: Diagnosis not present

## 2022-04-29 DIAGNOSIS — F0280 Dementia in other diseases classified elsewhere without behavioral disturbance: Secondary | ICD-10-CM | POA: Diagnosis not present

## 2022-04-29 DIAGNOSIS — R2689 Other abnormalities of gait and mobility: Secondary | ICD-10-CM | POA: Diagnosis not present

## 2022-04-29 DIAGNOSIS — F0390 Unspecified dementia without behavioral disturbance: Secondary | ICD-10-CM | POA: Diagnosis not present

## 2022-04-30 DIAGNOSIS — R279 Unspecified lack of coordination: Secondary | ICD-10-CM | POA: Diagnosis not present

## 2022-04-30 DIAGNOSIS — Z9181 History of falling: Secondary | ICD-10-CM | POA: Diagnosis not present

## 2022-04-30 DIAGNOSIS — M6281 Muscle weakness (generalized): Secondary | ICD-10-CM | POA: Diagnosis not present

## 2022-04-30 DIAGNOSIS — R2689 Other abnormalities of gait and mobility: Secondary | ICD-10-CM | POA: Diagnosis not present

## 2022-04-30 DIAGNOSIS — F0390 Unspecified dementia without behavioral disturbance: Secondary | ICD-10-CM | POA: Diagnosis not present

## 2022-04-30 DIAGNOSIS — F0280 Dementia in other diseases classified elsewhere without behavioral disturbance: Secondary | ICD-10-CM | POA: Diagnosis not present

## 2022-05-01 ENCOUNTER — Telehealth: Payer: Self-pay

## 2022-05-01 DIAGNOSIS — F0390 Unspecified dementia without behavioral disturbance: Secondary | ICD-10-CM | POA: Diagnosis not present

## 2022-05-01 DIAGNOSIS — R279 Unspecified lack of coordination: Secondary | ICD-10-CM | POA: Diagnosis not present

## 2022-05-01 DIAGNOSIS — M6281 Muscle weakness (generalized): Secondary | ICD-10-CM | POA: Diagnosis not present

## 2022-05-01 DIAGNOSIS — F0280 Dementia in other diseases classified elsewhere without behavioral disturbance: Secondary | ICD-10-CM | POA: Diagnosis not present

## 2022-05-01 DIAGNOSIS — R2689 Other abnormalities of gait and mobility: Secondary | ICD-10-CM | POA: Diagnosis not present

## 2022-05-01 DIAGNOSIS — Z9181 History of falling: Secondary | ICD-10-CM | POA: Diagnosis not present

## 2022-05-01 NOTE — Telephone Encounter (Signed)
        Patient  visited [facility] on 10/1   Telephone encounter attempt :  1st  A HIPAA compliant voice message was left requesting a return call.  Instructed patient to call back   [facility], [facility]  [phone] [address], [place],  [address] Phone: [phone] Email: [email or web address]

## 2022-05-04 DIAGNOSIS — M6281 Muscle weakness (generalized): Secondary | ICD-10-CM | POA: Diagnosis not present

## 2022-05-04 DIAGNOSIS — R279 Unspecified lack of coordination: Secondary | ICD-10-CM | POA: Diagnosis not present

## 2022-05-04 DIAGNOSIS — R2689 Other abnormalities of gait and mobility: Secondary | ICD-10-CM | POA: Diagnosis not present

## 2022-05-04 DIAGNOSIS — Z9181 History of falling: Secondary | ICD-10-CM | POA: Diagnosis not present

## 2022-05-04 DIAGNOSIS — F0280 Dementia in other diseases classified elsewhere without behavioral disturbance: Secondary | ICD-10-CM | POA: Diagnosis not present

## 2022-05-04 DIAGNOSIS — F0390 Unspecified dementia without behavioral disturbance: Secondary | ICD-10-CM | POA: Diagnosis not present

## 2022-05-05 DIAGNOSIS — R279 Unspecified lack of coordination: Secondary | ICD-10-CM | POA: Diagnosis not present

## 2022-05-05 DIAGNOSIS — R2689 Other abnormalities of gait and mobility: Secondary | ICD-10-CM | POA: Diagnosis not present

## 2022-05-05 DIAGNOSIS — F0390 Unspecified dementia without behavioral disturbance: Secondary | ICD-10-CM | POA: Diagnosis not present

## 2022-05-05 DIAGNOSIS — F0280 Dementia in other diseases classified elsewhere without behavioral disturbance: Secondary | ICD-10-CM | POA: Diagnosis not present

## 2022-05-05 DIAGNOSIS — Z9181 History of falling: Secondary | ICD-10-CM | POA: Diagnosis not present

## 2022-05-05 DIAGNOSIS — M6281 Muscle weakness (generalized): Secondary | ICD-10-CM | POA: Diagnosis not present

## 2022-05-06 DIAGNOSIS — F0390 Unspecified dementia without behavioral disturbance: Secondary | ICD-10-CM | POA: Diagnosis not present

## 2022-05-06 DIAGNOSIS — Z9181 History of falling: Secondary | ICD-10-CM | POA: Diagnosis not present

## 2022-05-06 DIAGNOSIS — F0280 Dementia in other diseases classified elsewhere without behavioral disturbance: Secondary | ICD-10-CM | POA: Diagnosis not present

## 2022-05-06 DIAGNOSIS — M6281 Muscle weakness (generalized): Secondary | ICD-10-CM | POA: Diagnosis not present

## 2022-05-06 DIAGNOSIS — R279 Unspecified lack of coordination: Secondary | ICD-10-CM | POA: Diagnosis not present

## 2022-05-06 DIAGNOSIS — R2689 Other abnormalities of gait and mobility: Secondary | ICD-10-CM | POA: Diagnosis not present

## 2022-05-07 DIAGNOSIS — R279 Unspecified lack of coordination: Secondary | ICD-10-CM | POA: Diagnosis not present

## 2022-05-07 DIAGNOSIS — F0390 Unspecified dementia without behavioral disturbance: Secondary | ICD-10-CM | POA: Diagnosis not present

## 2022-05-07 DIAGNOSIS — Z9181 History of falling: Secondary | ICD-10-CM | POA: Diagnosis not present

## 2022-05-07 DIAGNOSIS — M6281 Muscle weakness (generalized): Secondary | ICD-10-CM | POA: Diagnosis not present

## 2022-05-07 DIAGNOSIS — F0280 Dementia in other diseases classified elsewhere without behavioral disturbance: Secondary | ICD-10-CM | POA: Diagnosis not present

## 2022-05-07 DIAGNOSIS — R2689 Other abnormalities of gait and mobility: Secondary | ICD-10-CM | POA: Diagnosis not present

## 2022-05-07 DIAGNOSIS — I4891 Unspecified atrial fibrillation: Secondary | ICD-10-CM | POA: Diagnosis not present

## 2022-05-08 DIAGNOSIS — R279 Unspecified lack of coordination: Secondary | ICD-10-CM | POA: Diagnosis not present

## 2022-05-08 DIAGNOSIS — F0390 Unspecified dementia without behavioral disturbance: Secondary | ICD-10-CM | POA: Diagnosis not present

## 2022-05-08 DIAGNOSIS — F0280 Dementia in other diseases classified elsewhere without behavioral disturbance: Secondary | ICD-10-CM | POA: Diagnosis not present

## 2022-05-08 DIAGNOSIS — R2689 Other abnormalities of gait and mobility: Secondary | ICD-10-CM | POA: Diagnosis not present

## 2022-05-08 DIAGNOSIS — Z9181 History of falling: Secondary | ICD-10-CM | POA: Diagnosis not present

## 2022-05-08 DIAGNOSIS — M6281 Muscle weakness (generalized): Secondary | ICD-10-CM | POA: Diagnosis not present

## 2022-05-11 DIAGNOSIS — R2689 Other abnormalities of gait and mobility: Secondary | ICD-10-CM | POA: Diagnosis not present

## 2022-05-11 DIAGNOSIS — F0280 Dementia in other diseases classified elsewhere without behavioral disturbance: Secondary | ICD-10-CM | POA: Diagnosis not present

## 2022-05-11 DIAGNOSIS — R279 Unspecified lack of coordination: Secondary | ICD-10-CM | POA: Diagnosis not present

## 2022-05-11 DIAGNOSIS — M6281 Muscle weakness (generalized): Secondary | ICD-10-CM | POA: Diagnosis not present

## 2022-05-11 DIAGNOSIS — Z9181 History of falling: Secondary | ICD-10-CM | POA: Diagnosis not present

## 2022-05-11 DIAGNOSIS — F0390 Unspecified dementia without behavioral disturbance: Secondary | ICD-10-CM | POA: Diagnosis not present

## 2022-05-12 DIAGNOSIS — F0390 Unspecified dementia without behavioral disturbance: Secondary | ICD-10-CM | POA: Diagnosis not present

## 2022-05-12 DIAGNOSIS — Z9181 History of falling: Secondary | ICD-10-CM | POA: Diagnosis not present

## 2022-05-12 DIAGNOSIS — F0280 Dementia in other diseases classified elsewhere without behavioral disturbance: Secondary | ICD-10-CM | POA: Diagnosis not present

## 2022-05-12 DIAGNOSIS — R2689 Other abnormalities of gait and mobility: Secondary | ICD-10-CM | POA: Diagnosis not present

## 2022-05-12 DIAGNOSIS — M6281 Muscle weakness (generalized): Secondary | ICD-10-CM | POA: Diagnosis not present

## 2022-05-12 DIAGNOSIS — R279 Unspecified lack of coordination: Secondary | ICD-10-CM | POA: Diagnosis not present

## 2022-05-13 DIAGNOSIS — R279 Unspecified lack of coordination: Secondary | ICD-10-CM | POA: Diagnosis not present

## 2022-05-13 DIAGNOSIS — Z9181 History of falling: Secondary | ICD-10-CM | POA: Diagnosis not present

## 2022-05-13 DIAGNOSIS — F0280 Dementia in other diseases classified elsewhere without behavioral disturbance: Secondary | ICD-10-CM | POA: Diagnosis not present

## 2022-05-13 DIAGNOSIS — M6281 Muscle weakness (generalized): Secondary | ICD-10-CM | POA: Diagnosis not present

## 2022-05-13 DIAGNOSIS — F0390 Unspecified dementia without behavioral disturbance: Secondary | ICD-10-CM | POA: Diagnosis not present

## 2022-05-13 DIAGNOSIS — R2689 Other abnormalities of gait and mobility: Secondary | ICD-10-CM | POA: Diagnosis not present

## 2022-05-14 DIAGNOSIS — Z9181 History of falling: Secondary | ICD-10-CM | POA: Diagnosis not present

## 2022-05-14 DIAGNOSIS — F0390 Unspecified dementia without behavioral disturbance: Secondary | ICD-10-CM | POA: Diagnosis not present

## 2022-05-14 DIAGNOSIS — M6281 Muscle weakness (generalized): Secondary | ICD-10-CM | POA: Diagnosis not present

## 2022-05-14 DIAGNOSIS — R279 Unspecified lack of coordination: Secondary | ICD-10-CM | POA: Diagnosis not present

## 2022-05-14 DIAGNOSIS — R2689 Other abnormalities of gait and mobility: Secondary | ICD-10-CM | POA: Diagnosis not present

## 2022-05-14 DIAGNOSIS — F0280 Dementia in other diseases classified elsewhere without behavioral disturbance: Secondary | ICD-10-CM | POA: Diagnosis not present

## 2022-05-15 DIAGNOSIS — F0390 Unspecified dementia without behavioral disturbance: Secondary | ICD-10-CM | POA: Diagnosis not present

## 2022-05-15 DIAGNOSIS — R2689 Other abnormalities of gait and mobility: Secondary | ICD-10-CM | POA: Diagnosis not present

## 2022-05-15 DIAGNOSIS — M6281 Muscle weakness (generalized): Secondary | ICD-10-CM | POA: Diagnosis not present

## 2022-05-15 DIAGNOSIS — F0280 Dementia in other diseases classified elsewhere without behavioral disturbance: Secondary | ICD-10-CM | POA: Diagnosis not present

## 2022-05-15 DIAGNOSIS — Z9181 History of falling: Secondary | ICD-10-CM | POA: Diagnosis not present

## 2022-05-15 DIAGNOSIS — R279 Unspecified lack of coordination: Secondary | ICD-10-CM | POA: Diagnosis not present

## 2022-05-18 DIAGNOSIS — F0280 Dementia in other diseases classified elsewhere without behavioral disturbance: Secondary | ICD-10-CM | POA: Diagnosis not present

## 2022-05-18 DIAGNOSIS — R279 Unspecified lack of coordination: Secondary | ICD-10-CM | POA: Diagnosis not present

## 2022-05-18 DIAGNOSIS — F0390 Unspecified dementia without behavioral disturbance: Secondary | ICD-10-CM | POA: Diagnosis not present

## 2022-05-18 DIAGNOSIS — Z9181 History of falling: Secondary | ICD-10-CM | POA: Diagnosis not present

## 2022-05-18 DIAGNOSIS — M6281 Muscle weakness (generalized): Secondary | ICD-10-CM | POA: Diagnosis not present

## 2022-05-18 DIAGNOSIS — R2689 Other abnormalities of gait and mobility: Secondary | ICD-10-CM | POA: Diagnosis not present

## 2022-05-19 DIAGNOSIS — F0280 Dementia in other diseases classified elsewhere without behavioral disturbance: Secondary | ICD-10-CM | POA: Diagnosis not present

## 2022-05-19 DIAGNOSIS — Z9181 History of falling: Secondary | ICD-10-CM | POA: Diagnosis not present

## 2022-05-19 DIAGNOSIS — R279 Unspecified lack of coordination: Secondary | ICD-10-CM | POA: Diagnosis not present

## 2022-05-19 DIAGNOSIS — R2689 Other abnormalities of gait and mobility: Secondary | ICD-10-CM | POA: Diagnosis not present

## 2022-05-19 DIAGNOSIS — M6281 Muscle weakness (generalized): Secondary | ICD-10-CM | POA: Diagnosis not present

## 2022-05-19 DIAGNOSIS — F0390 Unspecified dementia without behavioral disturbance: Secondary | ICD-10-CM | POA: Diagnosis not present

## 2022-05-20 DIAGNOSIS — R279 Unspecified lack of coordination: Secondary | ICD-10-CM | POA: Diagnosis not present

## 2022-05-20 DIAGNOSIS — F0280 Dementia in other diseases classified elsewhere without behavioral disturbance: Secondary | ICD-10-CM | POA: Diagnosis not present

## 2022-05-20 DIAGNOSIS — M6281 Muscle weakness (generalized): Secondary | ICD-10-CM | POA: Diagnosis not present

## 2022-05-20 DIAGNOSIS — F0390 Unspecified dementia without behavioral disturbance: Secondary | ICD-10-CM | POA: Diagnosis not present

## 2022-05-20 DIAGNOSIS — R2689 Other abnormalities of gait and mobility: Secondary | ICD-10-CM | POA: Diagnosis not present

## 2022-05-20 DIAGNOSIS — Z9181 History of falling: Secondary | ICD-10-CM | POA: Diagnosis not present

## 2022-05-21 DIAGNOSIS — R279 Unspecified lack of coordination: Secondary | ICD-10-CM | POA: Diagnosis not present

## 2022-05-21 DIAGNOSIS — F0280 Dementia in other diseases classified elsewhere without behavioral disturbance: Secondary | ICD-10-CM | POA: Diagnosis not present

## 2022-05-21 DIAGNOSIS — M6281 Muscle weakness (generalized): Secondary | ICD-10-CM | POA: Diagnosis not present

## 2022-05-21 DIAGNOSIS — F0390 Unspecified dementia without behavioral disturbance: Secondary | ICD-10-CM | POA: Diagnosis not present

## 2022-05-21 DIAGNOSIS — Z9181 History of falling: Secondary | ICD-10-CM | POA: Diagnosis not present

## 2022-05-21 DIAGNOSIS — R2689 Other abnormalities of gait and mobility: Secondary | ICD-10-CM | POA: Diagnosis not present

## 2022-05-22 DIAGNOSIS — F0390 Unspecified dementia without behavioral disturbance: Secondary | ICD-10-CM | POA: Diagnosis not present

## 2022-05-22 DIAGNOSIS — R279 Unspecified lack of coordination: Secondary | ICD-10-CM | POA: Diagnosis not present

## 2022-05-22 DIAGNOSIS — M6281 Muscle weakness (generalized): Secondary | ICD-10-CM | POA: Diagnosis not present

## 2022-05-22 DIAGNOSIS — F0280 Dementia in other diseases classified elsewhere without behavioral disturbance: Secondary | ICD-10-CM | POA: Diagnosis not present

## 2022-05-22 DIAGNOSIS — Z9181 History of falling: Secondary | ICD-10-CM | POA: Diagnosis not present

## 2022-05-22 DIAGNOSIS — R2689 Other abnormalities of gait and mobility: Secondary | ICD-10-CM | POA: Diagnosis not present

## 2022-05-25 DIAGNOSIS — Z9181 History of falling: Secondary | ICD-10-CM | POA: Diagnosis not present

## 2022-05-25 DIAGNOSIS — R279 Unspecified lack of coordination: Secondary | ICD-10-CM | POA: Diagnosis not present

## 2022-05-25 DIAGNOSIS — R2689 Other abnormalities of gait and mobility: Secondary | ICD-10-CM | POA: Diagnosis not present

## 2022-05-25 DIAGNOSIS — F0280 Dementia in other diseases classified elsewhere without behavioral disturbance: Secondary | ICD-10-CM | POA: Diagnosis not present

## 2022-05-25 DIAGNOSIS — M6281 Muscle weakness (generalized): Secondary | ICD-10-CM | POA: Diagnosis not present

## 2022-05-25 DIAGNOSIS — F0390 Unspecified dementia without behavioral disturbance: Secondary | ICD-10-CM | POA: Diagnosis not present

## 2022-05-26 DIAGNOSIS — F0390 Unspecified dementia without behavioral disturbance: Secondary | ICD-10-CM | POA: Diagnosis not present

## 2022-05-26 DIAGNOSIS — Z9181 History of falling: Secondary | ICD-10-CM | POA: Diagnosis not present

## 2022-05-26 DIAGNOSIS — R2689 Other abnormalities of gait and mobility: Secondary | ICD-10-CM | POA: Diagnosis not present

## 2022-05-26 DIAGNOSIS — R279 Unspecified lack of coordination: Secondary | ICD-10-CM | POA: Diagnosis not present

## 2022-05-26 DIAGNOSIS — F0280 Dementia in other diseases classified elsewhere without behavioral disturbance: Secondary | ICD-10-CM | POA: Diagnosis not present

## 2022-05-26 DIAGNOSIS — M6281 Muscle weakness (generalized): Secondary | ICD-10-CM | POA: Diagnosis not present

## 2022-05-28 DIAGNOSIS — I4891 Unspecified atrial fibrillation: Secondary | ICD-10-CM | POA: Diagnosis not present

## 2022-05-28 DIAGNOSIS — M6281 Muscle weakness (generalized): Secondary | ICD-10-CM | POA: Diagnosis not present

## 2022-05-28 DIAGNOSIS — F325 Major depressive disorder, single episode, in full remission: Secondary | ICD-10-CM | POA: Diagnosis not present

## 2022-05-28 DIAGNOSIS — G250 Essential tremor: Secondary | ICD-10-CM | POA: Diagnosis not present

## 2022-05-28 DIAGNOSIS — F0390 Unspecified dementia without behavioral disturbance: Secondary | ICD-10-CM | POA: Diagnosis not present

## 2022-05-28 DIAGNOSIS — R2689 Other abnormalities of gait and mobility: Secondary | ICD-10-CM | POA: Diagnosis not present

## 2022-05-28 DIAGNOSIS — I10 Essential (primary) hypertension: Secondary | ICD-10-CM | POA: Diagnosis not present

## 2022-05-28 DIAGNOSIS — Z9181 History of falling: Secondary | ICD-10-CM | POA: Diagnosis not present

## 2022-05-28 DIAGNOSIS — R2681 Unsteadiness on feet: Secondary | ICD-10-CM | POA: Diagnosis not present

## 2022-05-28 DIAGNOSIS — M5412 Radiculopathy, cervical region: Secondary | ICD-10-CM | POA: Diagnosis not present

## 2022-05-28 DIAGNOSIS — R600 Localized edema: Secondary | ICD-10-CM | POA: Diagnosis not present

## 2022-05-28 DIAGNOSIS — R279 Unspecified lack of coordination: Secondary | ICD-10-CM | POA: Diagnosis not present

## 2022-05-28 DIAGNOSIS — R41841 Cognitive communication deficit: Secondary | ICD-10-CM | POA: Diagnosis not present

## 2022-05-28 DIAGNOSIS — F0280 Dementia in other diseases classified elsewhere without behavioral disturbance: Secondary | ICD-10-CM | POA: Diagnosis not present

## 2022-05-29 DIAGNOSIS — F0390 Unspecified dementia without behavioral disturbance: Secondary | ICD-10-CM | POA: Diagnosis not present

## 2022-05-29 DIAGNOSIS — Z9181 History of falling: Secondary | ICD-10-CM | POA: Diagnosis not present

## 2022-05-29 DIAGNOSIS — I4891 Unspecified atrial fibrillation: Secondary | ICD-10-CM | POA: Diagnosis not present

## 2022-05-29 DIAGNOSIS — F0280 Dementia in other diseases classified elsewhere without behavioral disturbance: Secondary | ICD-10-CM | POA: Diagnosis not present

## 2022-05-29 DIAGNOSIS — R2689 Other abnormalities of gait and mobility: Secondary | ICD-10-CM | POA: Diagnosis not present

## 2022-05-29 DIAGNOSIS — M6281 Muscle weakness (generalized): Secondary | ICD-10-CM | POA: Diagnosis not present

## 2022-06-01 DIAGNOSIS — F0390 Unspecified dementia without behavioral disturbance: Secondary | ICD-10-CM | POA: Diagnosis not present

## 2022-06-01 DIAGNOSIS — Z9181 History of falling: Secondary | ICD-10-CM | POA: Diagnosis not present

## 2022-06-01 DIAGNOSIS — M6281 Muscle weakness (generalized): Secondary | ICD-10-CM | POA: Diagnosis not present

## 2022-06-01 DIAGNOSIS — F0280 Dementia in other diseases classified elsewhere without behavioral disturbance: Secondary | ICD-10-CM | POA: Diagnosis not present

## 2022-06-01 DIAGNOSIS — R2689 Other abnormalities of gait and mobility: Secondary | ICD-10-CM | POA: Diagnosis not present

## 2022-06-01 DIAGNOSIS — I4891 Unspecified atrial fibrillation: Secondary | ICD-10-CM | POA: Diagnosis not present

## 2022-06-02 DIAGNOSIS — I4891 Unspecified atrial fibrillation: Secondary | ICD-10-CM | POA: Diagnosis not present

## 2022-06-02 DIAGNOSIS — M6281 Muscle weakness (generalized): Secondary | ICD-10-CM | POA: Diagnosis not present

## 2022-06-02 DIAGNOSIS — Z9181 History of falling: Secondary | ICD-10-CM | POA: Diagnosis not present

## 2022-06-02 DIAGNOSIS — R2689 Other abnormalities of gait and mobility: Secondary | ICD-10-CM | POA: Diagnosis not present

## 2022-06-02 DIAGNOSIS — F0280 Dementia in other diseases classified elsewhere without behavioral disturbance: Secondary | ICD-10-CM | POA: Diagnosis not present

## 2022-06-02 DIAGNOSIS — F0390 Unspecified dementia without behavioral disturbance: Secondary | ICD-10-CM | POA: Diagnosis not present

## 2022-06-03 DIAGNOSIS — F0390 Unspecified dementia without behavioral disturbance: Secondary | ICD-10-CM | POA: Diagnosis not present

## 2022-06-03 DIAGNOSIS — F0280 Dementia in other diseases classified elsewhere without behavioral disturbance: Secondary | ICD-10-CM | POA: Diagnosis not present

## 2022-06-03 DIAGNOSIS — I4891 Unspecified atrial fibrillation: Secondary | ICD-10-CM | POA: Diagnosis not present

## 2022-06-03 DIAGNOSIS — M6281 Muscle weakness (generalized): Secondary | ICD-10-CM | POA: Diagnosis not present

## 2022-06-03 DIAGNOSIS — R2689 Other abnormalities of gait and mobility: Secondary | ICD-10-CM | POA: Diagnosis not present

## 2022-06-03 DIAGNOSIS — Z9181 History of falling: Secondary | ICD-10-CM | POA: Diagnosis not present

## 2022-06-04 DIAGNOSIS — R2689 Other abnormalities of gait and mobility: Secondary | ICD-10-CM | POA: Diagnosis not present

## 2022-06-04 DIAGNOSIS — F0280 Dementia in other diseases classified elsewhere without behavioral disturbance: Secondary | ICD-10-CM | POA: Diagnosis not present

## 2022-06-04 DIAGNOSIS — Z9181 History of falling: Secondary | ICD-10-CM | POA: Diagnosis not present

## 2022-06-04 DIAGNOSIS — I4891 Unspecified atrial fibrillation: Secondary | ICD-10-CM | POA: Diagnosis not present

## 2022-06-04 DIAGNOSIS — M6281 Muscle weakness (generalized): Secondary | ICD-10-CM | POA: Diagnosis not present

## 2022-06-04 DIAGNOSIS — F0390 Unspecified dementia without behavioral disturbance: Secondary | ICD-10-CM | POA: Diagnosis not present

## 2022-06-05 DIAGNOSIS — F0280 Dementia in other diseases classified elsewhere without behavioral disturbance: Secondary | ICD-10-CM | POA: Diagnosis not present

## 2022-06-05 DIAGNOSIS — R2689 Other abnormalities of gait and mobility: Secondary | ICD-10-CM | POA: Diagnosis not present

## 2022-06-05 DIAGNOSIS — M6281 Muscle weakness (generalized): Secondary | ICD-10-CM | POA: Diagnosis not present

## 2022-06-05 DIAGNOSIS — I4891 Unspecified atrial fibrillation: Secondary | ICD-10-CM | POA: Diagnosis not present

## 2022-06-05 DIAGNOSIS — Z9181 History of falling: Secondary | ICD-10-CM | POA: Diagnosis not present

## 2022-06-05 DIAGNOSIS — F0390 Unspecified dementia without behavioral disturbance: Secondary | ICD-10-CM | POA: Diagnosis not present

## 2022-06-08 DIAGNOSIS — Z9181 History of falling: Secondary | ICD-10-CM | POA: Diagnosis not present

## 2022-06-08 DIAGNOSIS — I4891 Unspecified atrial fibrillation: Secondary | ICD-10-CM | POA: Diagnosis not present

## 2022-06-08 DIAGNOSIS — F0280 Dementia in other diseases classified elsewhere without behavioral disturbance: Secondary | ICD-10-CM | POA: Diagnosis not present

## 2022-06-08 DIAGNOSIS — F0390 Unspecified dementia without behavioral disturbance: Secondary | ICD-10-CM | POA: Diagnosis not present

## 2022-06-08 DIAGNOSIS — M6281 Muscle weakness (generalized): Secondary | ICD-10-CM | POA: Diagnosis not present

## 2022-06-08 DIAGNOSIS — R2689 Other abnormalities of gait and mobility: Secondary | ICD-10-CM | POA: Diagnosis not present

## 2022-06-09 DIAGNOSIS — R2689 Other abnormalities of gait and mobility: Secondary | ICD-10-CM | POA: Diagnosis not present

## 2022-06-09 DIAGNOSIS — I4891 Unspecified atrial fibrillation: Secondary | ICD-10-CM | POA: Diagnosis not present

## 2022-06-09 DIAGNOSIS — F0280 Dementia in other diseases classified elsewhere without behavioral disturbance: Secondary | ICD-10-CM | POA: Diagnosis not present

## 2022-06-09 DIAGNOSIS — F0390 Unspecified dementia without behavioral disturbance: Secondary | ICD-10-CM | POA: Diagnosis not present

## 2022-06-09 DIAGNOSIS — M6281 Muscle weakness (generalized): Secondary | ICD-10-CM | POA: Diagnosis not present

## 2022-06-09 DIAGNOSIS — Z9181 History of falling: Secondary | ICD-10-CM | POA: Diagnosis not present

## 2022-06-10 DIAGNOSIS — F0390 Unspecified dementia without behavioral disturbance: Secondary | ICD-10-CM | POA: Diagnosis not present

## 2022-06-10 DIAGNOSIS — Z9181 History of falling: Secondary | ICD-10-CM | POA: Diagnosis not present

## 2022-06-10 DIAGNOSIS — M6281 Muscle weakness (generalized): Secondary | ICD-10-CM | POA: Diagnosis not present

## 2022-06-10 DIAGNOSIS — F0280 Dementia in other diseases classified elsewhere without behavioral disturbance: Secondary | ICD-10-CM | POA: Diagnosis not present

## 2022-06-10 DIAGNOSIS — I4891 Unspecified atrial fibrillation: Secondary | ICD-10-CM | POA: Diagnosis not present

## 2022-06-10 DIAGNOSIS — R2689 Other abnormalities of gait and mobility: Secondary | ICD-10-CM | POA: Diagnosis not present

## 2022-06-11 DIAGNOSIS — Z9181 History of falling: Secondary | ICD-10-CM | POA: Diagnosis not present

## 2022-06-11 DIAGNOSIS — I4891 Unspecified atrial fibrillation: Secondary | ICD-10-CM | POA: Diagnosis not present

## 2022-06-11 DIAGNOSIS — R2689 Other abnormalities of gait and mobility: Secondary | ICD-10-CM | POA: Diagnosis not present

## 2022-06-11 DIAGNOSIS — M6281 Muscle weakness (generalized): Secondary | ICD-10-CM | POA: Diagnosis not present

## 2022-06-11 DIAGNOSIS — F0280 Dementia in other diseases classified elsewhere without behavioral disturbance: Secondary | ICD-10-CM | POA: Diagnosis not present

## 2022-06-11 DIAGNOSIS — F0390 Unspecified dementia without behavioral disturbance: Secondary | ICD-10-CM | POA: Diagnosis not present

## 2022-06-12 DIAGNOSIS — M6281 Muscle weakness (generalized): Secondary | ICD-10-CM | POA: Diagnosis not present

## 2022-06-12 DIAGNOSIS — F0280 Dementia in other diseases classified elsewhere without behavioral disturbance: Secondary | ICD-10-CM | POA: Diagnosis not present

## 2022-06-12 DIAGNOSIS — Z9181 History of falling: Secondary | ICD-10-CM | POA: Diagnosis not present

## 2022-06-12 DIAGNOSIS — F0390 Unspecified dementia without behavioral disturbance: Secondary | ICD-10-CM | POA: Diagnosis not present

## 2022-06-12 DIAGNOSIS — R2689 Other abnormalities of gait and mobility: Secondary | ICD-10-CM | POA: Diagnosis not present

## 2022-06-12 DIAGNOSIS — I4891 Unspecified atrial fibrillation: Secondary | ICD-10-CM | POA: Diagnosis not present

## 2022-06-15 DIAGNOSIS — F0280 Dementia in other diseases classified elsewhere without behavioral disturbance: Secondary | ICD-10-CM | POA: Diagnosis not present

## 2022-06-15 DIAGNOSIS — R2689 Other abnormalities of gait and mobility: Secondary | ICD-10-CM | POA: Diagnosis not present

## 2022-06-15 DIAGNOSIS — I4891 Unspecified atrial fibrillation: Secondary | ICD-10-CM | POA: Diagnosis not present

## 2022-06-15 DIAGNOSIS — M6281 Muscle weakness (generalized): Secondary | ICD-10-CM | POA: Diagnosis not present

## 2022-06-15 DIAGNOSIS — F0390 Unspecified dementia without behavioral disturbance: Secondary | ICD-10-CM | POA: Diagnosis not present

## 2022-06-15 DIAGNOSIS — Z9181 History of falling: Secondary | ICD-10-CM | POA: Diagnosis not present

## 2022-06-16 DIAGNOSIS — F0280 Dementia in other diseases classified elsewhere without behavioral disturbance: Secondary | ICD-10-CM | POA: Diagnosis not present

## 2022-06-16 DIAGNOSIS — M6281 Muscle weakness (generalized): Secondary | ICD-10-CM | POA: Diagnosis not present

## 2022-06-16 DIAGNOSIS — R2689 Other abnormalities of gait and mobility: Secondary | ICD-10-CM | POA: Diagnosis not present

## 2022-06-16 DIAGNOSIS — F0390 Unspecified dementia without behavioral disturbance: Secondary | ICD-10-CM | POA: Diagnosis not present

## 2022-06-16 DIAGNOSIS — Z9181 History of falling: Secondary | ICD-10-CM | POA: Diagnosis not present

## 2022-06-16 DIAGNOSIS — I4891 Unspecified atrial fibrillation: Secondary | ICD-10-CM | POA: Diagnosis not present

## 2022-06-17 DIAGNOSIS — M6281 Muscle weakness (generalized): Secondary | ICD-10-CM | POA: Diagnosis not present

## 2022-06-17 DIAGNOSIS — F0390 Unspecified dementia without behavioral disturbance: Secondary | ICD-10-CM | POA: Diagnosis not present

## 2022-06-17 DIAGNOSIS — I4891 Unspecified atrial fibrillation: Secondary | ICD-10-CM | POA: Diagnosis not present

## 2022-06-17 DIAGNOSIS — L03116 Cellulitis of left lower limb: Secondary | ICD-10-CM | POA: Diagnosis not present

## 2022-06-17 DIAGNOSIS — F0280 Dementia in other diseases classified elsewhere without behavioral disturbance: Secondary | ICD-10-CM | POA: Diagnosis not present

## 2022-06-17 DIAGNOSIS — Z9181 History of falling: Secondary | ICD-10-CM | POA: Diagnosis not present

## 2022-06-17 DIAGNOSIS — R2689 Other abnormalities of gait and mobility: Secondary | ICD-10-CM | POA: Diagnosis not present

## 2022-06-18 DIAGNOSIS — R2689 Other abnormalities of gait and mobility: Secondary | ICD-10-CM | POA: Diagnosis not present

## 2022-06-18 DIAGNOSIS — Z9181 History of falling: Secondary | ICD-10-CM | POA: Diagnosis not present

## 2022-06-18 DIAGNOSIS — F0390 Unspecified dementia without behavioral disturbance: Secondary | ICD-10-CM | POA: Diagnosis not present

## 2022-06-18 DIAGNOSIS — F0280 Dementia in other diseases classified elsewhere without behavioral disturbance: Secondary | ICD-10-CM | POA: Diagnosis not present

## 2022-06-18 DIAGNOSIS — I4891 Unspecified atrial fibrillation: Secondary | ICD-10-CM | POA: Diagnosis not present

## 2022-06-18 DIAGNOSIS — M6281 Muscle weakness (generalized): Secondary | ICD-10-CM | POA: Diagnosis not present

## 2022-06-22 DIAGNOSIS — F0280 Dementia in other diseases classified elsewhere without behavioral disturbance: Secondary | ICD-10-CM | POA: Diagnosis not present

## 2022-06-22 DIAGNOSIS — R2689 Other abnormalities of gait and mobility: Secondary | ICD-10-CM | POA: Diagnosis not present

## 2022-06-22 DIAGNOSIS — F0390 Unspecified dementia without behavioral disturbance: Secondary | ICD-10-CM | POA: Diagnosis not present

## 2022-06-22 DIAGNOSIS — Z9181 History of falling: Secondary | ICD-10-CM | POA: Diagnosis not present

## 2022-06-22 DIAGNOSIS — M6281 Muscle weakness (generalized): Secondary | ICD-10-CM | POA: Diagnosis not present

## 2022-06-22 DIAGNOSIS — I4891 Unspecified atrial fibrillation: Secondary | ICD-10-CM | POA: Diagnosis not present

## 2022-06-23 DIAGNOSIS — M6281 Muscle weakness (generalized): Secondary | ICD-10-CM | POA: Diagnosis not present

## 2022-06-23 DIAGNOSIS — Z9181 History of falling: Secondary | ICD-10-CM | POA: Diagnosis not present

## 2022-06-23 DIAGNOSIS — F0390 Unspecified dementia without behavioral disturbance: Secondary | ICD-10-CM | POA: Diagnosis not present

## 2022-06-23 DIAGNOSIS — I4891 Unspecified atrial fibrillation: Secondary | ICD-10-CM | POA: Diagnosis not present

## 2022-06-23 DIAGNOSIS — H35373 Puckering of macula, bilateral: Secondary | ICD-10-CM | POA: Diagnosis not present

## 2022-06-23 DIAGNOSIS — R2689 Other abnormalities of gait and mobility: Secondary | ICD-10-CM | POA: Diagnosis not present

## 2022-06-23 DIAGNOSIS — F0280 Dementia in other diseases classified elsewhere without behavioral disturbance: Secondary | ICD-10-CM | POA: Diagnosis not present

## 2022-06-24 DIAGNOSIS — R2689 Other abnormalities of gait and mobility: Secondary | ICD-10-CM | POA: Diagnosis not present

## 2022-06-24 DIAGNOSIS — I4891 Unspecified atrial fibrillation: Secondary | ICD-10-CM | POA: Diagnosis not present

## 2022-06-24 DIAGNOSIS — M6281 Muscle weakness (generalized): Secondary | ICD-10-CM | POA: Diagnosis not present

## 2022-06-24 DIAGNOSIS — Z9181 History of falling: Secondary | ICD-10-CM | POA: Diagnosis not present

## 2022-06-24 DIAGNOSIS — F0390 Unspecified dementia without behavioral disturbance: Secondary | ICD-10-CM | POA: Diagnosis not present

## 2022-06-24 DIAGNOSIS — F0280 Dementia in other diseases classified elsewhere without behavioral disturbance: Secondary | ICD-10-CM | POA: Diagnosis not present

## 2022-06-25 DIAGNOSIS — F0280 Dementia in other diseases classified elsewhere without behavioral disturbance: Secondary | ICD-10-CM | POA: Diagnosis not present

## 2022-06-25 DIAGNOSIS — Z9181 History of falling: Secondary | ICD-10-CM | POA: Diagnosis not present

## 2022-06-25 DIAGNOSIS — R2689 Other abnormalities of gait and mobility: Secondary | ICD-10-CM | POA: Diagnosis not present

## 2022-06-25 DIAGNOSIS — M6281 Muscle weakness (generalized): Secondary | ICD-10-CM | POA: Diagnosis not present

## 2022-06-25 DIAGNOSIS — F0390 Unspecified dementia without behavioral disturbance: Secondary | ICD-10-CM | POA: Diagnosis not present

## 2022-06-25 DIAGNOSIS — I4891 Unspecified atrial fibrillation: Secondary | ICD-10-CM | POA: Diagnosis not present

## 2022-06-29 DIAGNOSIS — M6281 Muscle weakness (generalized): Secondary | ICD-10-CM | POA: Diagnosis not present

## 2022-06-29 DIAGNOSIS — R279 Unspecified lack of coordination: Secondary | ICD-10-CM | POA: Diagnosis not present

## 2022-06-29 DIAGNOSIS — R2689 Other abnormalities of gait and mobility: Secondary | ICD-10-CM | POA: Diagnosis not present

## 2022-06-29 DIAGNOSIS — Z9181 History of falling: Secondary | ICD-10-CM | POA: Diagnosis not present

## 2022-06-29 DIAGNOSIS — F0280 Dementia in other diseases classified elsewhere without behavioral disturbance: Secondary | ICD-10-CM | POA: Diagnosis not present

## 2022-06-29 DIAGNOSIS — F0390 Unspecified dementia without behavioral disturbance: Secondary | ICD-10-CM | POA: Diagnosis not present

## 2022-06-30 DIAGNOSIS — M6281 Muscle weakness (generalized): Secondary | ICD-10-CM | POA: Diagnosis not present

## 2022-06-30 DIAGNOSIS — Z9181 History of falling: Secondary | ICD-10-CM | POA: Diagnosis not present

## 2022-06-30 DIAGNOSIS — F0390 Unspecified dementia without behavioral disturbance: Secondary | ICD-10-CM | POA: Diagnosis not present

## 2022-06-30 DIAGNOSIS — F0280 Dementia in other diseases classified elsewhere without behavioral disturbance: Secondary | ICD-10-CM | POA: Diagnosis not present

## 2022-06-30 DIAGNOSIS — R279 Unspecified lack of coordination: Secondary | ICD-10-CM | POA: Diagnosis not present

## 2022-06-30 DIAGNOSIS — R2689 Other abnormalities of gait and mobility: Secondary | ICD-10-CM | POA: Diagnosis not present

## 2022-07-01 DIAGNOSIS — R2689 Other abnormalities of gait and mobility: Secondary | ICD-10-CM | POA: Diagnosis not present

## 2022-07-01 DIAGNOSIS — Z9181 History of falling: Secondary | ICD-10-CM | POA: Diagnosis not present

## 2022-07-01 DIAGNOSIS — F0390 Unspecified dementia without behavioral disturbance: Secondary | ICD-10-CM | POA: Diagnosis not present

## 2022-07-01 DIAGNOSIS — F0280 Dementia in other diseases classified elsewhere without behavioral disturbance: Secondary | ICD-10-CM | POA: Diagnosis not present

## 2022-07-01 DIAGNOSIS — M6281 Muscle weakness (generalized): Secondary | ICD-10-CM | POA: Diagnosis not present

## 2022-07-01 DIAGNOSIS — R279 Unspecified lack of coordination: Secondary | ICD-10-CM | POA: Diagnosis not present

## 2022-07-02 DIAGNOSIS — F0390 Unspecified dementia without behavioral disturbance: Secondary | ICD-10-CM | POA: Diagnosis not present

## 2022-07-02 DIAGNOSIS — F0280 Dementia in other diseases classified elsewhere without behavioral disturbance: Secondary | ICD-10-CM | POA: Diagnosis not present

## 2022-07-02 DIAGNOSIS — Z9181 History of falling: Secondary | ICD-10-CM | POA: Diagnosis not present

## 2022-07-02 DIAGNOSIS — R2689 Other abnormalities of gait and mobility: Secondary | ICD-10-CM | POA: Diagnosis not present

## 2022-07-02 DIAGNOSIS — R279 Unspecified lack of coordination: Secondary | ICD-10-CM | POA: Diagnosis not present

## 2022-07-02 DIAGNOSIS — I7091 Generalized atherosclerosis: Secondary | ICD-10-CM | POA: Diagnosis not present

## 2022-07-02 DIAGNOSIS — B351 Tinea unguium: Secondary | ICD-10-CM | POA: Diagnosis not present

## 2022-07-02 DIAGNOSIS — M6281 Muscle weakness (generalized): Secondary | ICD-10-CM | POA: Diagnosis not present

## 2022-07-03 DIAGNOSIS — R279 Unspecified lack of coordination: Secondary | ICD-10-CM | POA: Diagnosis not present

## 2022-07-03 DIAGNOSIS — Z9181 History of falling: Secondary | ICD-10-CM | POA: Diagnosis not present

## 2022-07-03 DIAGNOSIS — F0390 Unspecified dementia without behavioral disturbance: Secondary | ICD-10-CM | POA: Diagnosis not present

## 2022-07-03 DIAGNOSIS — R2689 Other abnormalities of gait and mobility: Secondary | ICD-10-CM | POA: Diagnosis not present

## 2022-07-03 DIAGNOSIS — M6281 Muscle weakness (generalized): Secondary | ICD-10-CM | POA: Diagnosis not present

## 2022-07-03 DIAGNOSIS — F0280 Dementia in other diseases classified elsewhere without behavioral disturbance: Secondary | ICD-10-CM | POA: Diagnosis not present

## 2022-07-06 DIAGNOSIS — M6281 Muscle weakness (generalized): Secondary | ICD-10-CM | POA: Diagnosis not present

## 2022-07-06 DIAGNOSIS — R2689 Other abnormalities of gait and mobility: Secondary | ICD-10-CM | POA: Diagnosis not present

## 2022-07-06 DIAGNOSIS — Z9181 History of falling: Secondary | ICD-10-CM | POA: Diagnosis not present

## 2022-07-06 DIAGNOSIS — F0280 Dementia in other diseases classified elsewhere without behavioral disturbance: Secondary | ICD-10-CM | POA: Diagnosis not present

## 2022-07-06 DIAGNOSIS — F0390 Unspecified dementia without behavioral disturbance: Secondary | ICD-10-CM | POA: Diagnosis not present

## 2022-07-06 DIAGNOSIS — R279 Unspecified lack of coordination: Secondary | ICD-10-CM | POA: Diagnosis not present

## 2022-07-07 DIAGNOSIS — M6281 Muscle weakness (generalized): Secondary | ICD-10-CM | POA: Diagnosis not present

## 2022-07-07 DIAGNOSIS — Z9181 History of falling: Secondary | ICD-10-CM | POA: Diagnosis not present

## 2022-07-07 DIAGNOSIS — F0390 Unspecified dementia without behavioral disturbance: Secondary | ICD-10-CM | POA: Diagnosis not present

## 2022-07-07 DIAGNOSIS — R279 Unspecified lack of coordination: Secondary | ICD-10-CM | POA: Diagnosis not present

## 2022-07-07 DIAGNOSIS — F0280 Dementia in other diseases classified elsewhere without behavioral disturbance: Secondary | ICD-10-CM | POA: Diagnosis not present

## 2022-07-07 DIAGNOSIS — R2689 Other abnormalities of gait and mobility: Secondary | ICD-10-CM | POA: Diagnosis not present

## 2022-07-10 DIAGNOSIS — M6281 Muscle weakness (generalized): Secondary | ICD-10-CM | POA: Diagnosis not present

## 2022-07-10 DIAGNOSIS — F0280 Dementia in other diseases classified elsewhere without behavioral disturbance: Secondary | ICD-10-CM | POA: Diagnosis not present

## 2022-07-10 DIAGNOSIS — R2689 Other abnormalities of gait and mobility: Secondary | ICD-10-CM | POA: Diagnosis not present

## 2022-07-10 DIAGNOSIS — Z9181 History of falling: Secondary | ICD-10-CM | POA: Diagnosis not present

## 2022-07-10 DIAGNOSIS — F0390 Unspecified dementia without behavioral disturbance: Secondary | ICD-10-CM | POA: Diagnosis not present

## 2022-07-10 DIAGNOSIS — R279 Unspecified lack of coordination: Secondary | ICD-10-CM | POA: Diagnosis not present

## 2022-07-13 DIAGNOSIS — F0280 Dementia in other diseases classified elsewhere without behavioral disturbance: Secondary | ICD-10-CM | POA: Diagnosis not present

## 2022-07-13 DIAGNOSIS — R279 Unspecified lack of coordination: Secondary | ICD-10-CM | POA: Diagnosis not present

## 2022-07-13 DIAGNOSIS — M6281 Muscle weakness (generalized): Secondary | ICD-10-CM | POA: Diagnosis not present

## 2022-07-13 DIAGNOSIS — Z9181 History of falling: Secondary | ICD-10-CM | POA: Diagnosis not present

## 2022-07-13 DIAGNOSIS — R2689 Other abnormalities of gait and mobility: Secondary | ICD-10-CM | POA: Diagnosis not present

## 2022-07-13 DIAGNOSIS — F0390 Unspecified dementia without behavioral disturbance: Secondary | ICD-10-CM | POA: Diagnosis not present

## 2022-07-14 DIAGNOSIS — Z9181 History of falling: Secondary | ICD-10-CM | POA: Diagnosis not present

## 2022-07-14 DIAGNOSIS — R2689 Other abnormalities of gait and mobility: Secondary | ICD-10-CM | POA: Diagnosis not present

## 2022-07-14 DIAGNOSIS — F0280 Dementia in other diseases classified elsewhere without behavioral disturbance: Secondary | ICD-10-CM | POA: Diagnosis not present

## 2022-07-14 DIAGNOSIS — R279 Unspecified lack of coordination: Secondary | ICD-10-CM | POA: Diagnosis not present

## 2022-07-14 DIAGNOSIS — F0390 Unspecified dementia without behavioral disturbance: Secondary | ICD-10-CM | POA: Diagnosis not present

## 2022-07-14 DIAGNOSIS — M6281 Muscle weakness (generalized): Secondary | ICD-10-CM | POA: Diagnosis not present

## 2022-07-16 ENCOUNTER — Encounter: Payer: Medicare Other | Attending: Physician Assistant | Admitting: Physician Assistant

## 2022-07-16 DIAGNOSIS — L97822 Non-pressure chronic ulcer of other part of left lower leg with fat layer exposed: Secondary | ICD-10-CM | POA: Insufficient documentation

## 2022-07-16 DIAGNOSIS — R279 Unspecified lack of coordination: Secondary | ICD-10-CM | POA: Diagnosis not present

## 2022-07-16 DIAGNOSIS — I10 Essential (primary) hypertension: Secondary | ICD-10-CM | POA: Insufficient documentation

## 2022-07-16 DIAGNOSIS — I872 Venous insufficiency (chronic) (peripheral): Secondary | ICD-10-CM | POA: Insufficient documentation

## 2022-07-16 DIAGNOSIS — M6281 Muscle weakness (generalized): Secondary | ICD-10-CM | POA: Diagnosis not present

## 2022-07-16 DIAGNOSIS — I480 Paroxysmal atrial fibrillation: Secondary | ICD-10-CM | POA: Insufficient documentation

## 2022-07-16 DIAGNOSIS — Z7901 Long term (current) use of anticoagulants: Secondary | ICD-10-CM | POA: Insufficient documentation

## 2022-07-16 DIAGNOSIS — R2689 Other abnormalities of gait and mobility: Secondary | ICD-10-CM | POA: Diagnosis not present

## 2022-07-16 DIAGNOSIS — I87332 Chronic venous hypertension (idiopathic) with ulcer and inflammation of left lower extremity: Secondary | ICD-10-CM | POA: Diagnosis not present

## 2022-07-16 DIAGNOSIS — F0280 Dementia in other diseases classified elsewhere without behavioral disturbance: Secondary | ICD-10-CM | POA: Diagnosis not present

## 2022-07-16 DIAGNOSIS — F0390 Unspecified dementia without behavioral disturbance: Secondary | ICD-10-CM | POA: Diagnosis not present

## 2022-07-16 DIAGNOSIS — Z9181 History of falling: Secondary | ICD-10-CM | POA: Diagnosis not present

## 2022-07-16 NOTE — Progress Notes (Signed)
[name], [name] ([id number]) [id number] Page 1 of 10 Visit Report for 07/16/2022 Allergy List Details Patient Name: Date of Service: [name] Royals, DO RCA [name] 07/16/2022 7:30 A M Medical Record Number: [id number] Patient Account Number: [id number] Date of Birth/Sex: Treating RN: [birth year] (88 y.o. Female) [name] Primary Care : Frazier Richards Other Clinician: Referring : Treating /Extender: [name] in Treatment: 0 Allergies Active Allergies erythromycin base Sulfa (Sulfonamide Antibiotics) wheat Allergy Notes Electronic Signature([name]) Signed: 07/16/2022 3:46:10 PM By: [name], BSN, RN, CWS, [name] RN, BSN Entered By: [name], BSN, RN, CWS, [name] on 07/16/2022 08:09:18 -------------------------------------------------------------------------------- Arrival Information Details Patient Name: Date of Service: [name] [name] Spikes, DO RCA [name] 07/16/2022 7:30 A M Medical Record Number: [id number] Patient Account Number: [id number] Date of Birth/Sex: Treating RN: [birth year] (88 y.o. Female) [name] Primary Care : Frazier Richards Other Clinician: Referring : Treating /Extender: [name] in Treatment: 0 Visit Information Patient Arrived: Wheel Chair Arrival Time: 07:54 Accompanied By: son Transfer Assistance: Manual Patient Identification Verified: Yes Secondary Verification Process Completed: Yes Patient Has Alerts: Yes Patient Alerts: Patient on Blood Thinner NOT DIABETIC [name] ([id number]) [id number] Page 2 of 10 Electronic Signature([name]) Signed: 07/16/2022 3:46:10 PM By: [name], BSN, RN, CWS, [name] RN, BSN Entered By: [name], BSN, RN, CWS, [name] on 07/16/2022 07:55:23 -------------------------------------------------------------------------------- Clinic Level of Care Assessment Details Patient Name: Date of Service: [name] MSEY, DO  RCA [name] 07/16/2022 7:30 A M Medical Record Number: [id number] Patient Account Number: [id number] Date of Birth/Sex: Treating RN: 09/23/1925 (88 y.o. Female) [name] Primary Care : Frazier Richards Other Clinician: Referring : Treating /Extender: [name] in Treatment: 0 Clinic Level of Care Assessment Items TOOL 1 Quantity Score [] - 0 Use when EandM and Procedure is performed on INITIAL visit ASSESSMENTS - Nursing Assessment / Reassessment X- 1 20 General Physical Exam (combine w/ comprehensive assessment (listed just below) when performed on new pt. evals) X- 1 25 Comprehensive Assessment (HX, ROS, Risk Assessments, Wounds Hx, etc.) ASSESSMENTS - Wound and Skin Assessment / Reassessment [] - 0 Dermatologic / Skin Assessment (not related to wound area) ASSESSMENTS - Ostomy and/or Continence Assessment and Care [] - 0 Incontinence Assessment and Management [] - 0 Ostomy Care Assessment and Management (repouching, etc.) PROCESS - Coordination of Care X - Simple Patient / Family Education for ongoing care 1 15 [] - 0 Complex (extensive) Patient / Family Education for ongoing care X- 1 10 Staff obtains Programmer, systems, Records, T Results / Process Orders est [] - 0 Staff telephones HHA, Nursing Homes / Clarify orders / etc [] - 0 Routine Transfer to another Facility (non-emergent condition) [] - 0 Routine Hospital Admission (non-emergent condition) X- 1 15 New Admissions / Biomedical engineer / Ordering NPWT Apligraf, etc. , [] - 0 Emergency Hospital Admission (emergent condition) PROCESS - Special Needs [] - 0 Pediatric / Minor Patient Management [] - 0 Isolation Patient Management [] - 0 Hearing / Language / Visual special needs [] - 0 Assessment of Community assistance (transportation, D/C planning, etc.) [] - 0 Additional assistance / Altered mentation [] - 0 Support Surface([name]) Assessment (bed, cushion, seat,  etc.) INTERVENTIONS - Miscellaneous [] - 0 External ear exam [] - 0 Patient Transfer (multiple staff / [employer] / Similar devices) [place], [name] ([id number]) [id number] Page 3 of 10 [] - 0 Simple Staple / Suture removal (25 or less) [] - 0 Complex Staple / Suture removal (26 or more) [] - 0  Hypo/Hyperglycemic Management (do not check if billed separately) X- 1 15 Ankle / Brachial Index (ABI) - do not check if billed separately Has the patient been seen at the hospital within the last three years: Yes Total Score: 100 Level Of Care: New/Established - Level 3 Electronic Signature([name]) Signed: 07/16/2022 3:46:10 PM By: [name], BSN, RN, CWS, [name] RN, BSN Entered By: [name], BSN, RN, CWS, [name] on 07/16/2022 08:50:03 -------------------------------------------------------------------------------- Encounter Discharge Information Details Patient Name: Date of Service: [name] MSEY, DO RCA [name] 07/16/2022 7:30 A M Medical Record Number: [id number] Patient Account Number: [id number] Date of Birth/Sex: Treating RN: Jul 10, 1926 (88 y.o. Female) [name] Primary Care : Frazier Richards Other Clinician: Referring : Treating /Extender: [name] in Treatment: 0 Encounter Discharge Information Items Post Procedure Vitals Discharge Condition: Stable Temperature (F): 98.0 Ambulatory Status: Wheelchair Pulse (bpm): 78 Discharge Destination: Home Respiratory Rate (breaths/min): 16 Transportation: Private Auto Blood Pressure (mmHg): 131/71 Accompanied By: son Schedule Follow-up Appointment: Yes Clinical Summary of Care: Electronic Signature([name]) Signed: 07/16/2022 10:44:49 AM By: [name], BSN, RN, CWS, [name] RN, BSN Entered By: [name], BSN, RN, CWS, [name] on 07/16/2022 10:44:49 -------------------------------------------------------------------------------- Lower Extremity Assessment Details Patient Name: Date of Service: [name]  MSEY, DO RCA [name] 07/16/2022 7:30 A M Medical Record Number: [id number] Patient Account Number: [id number] Date of Birth/Sex: Treating RN: 1926/07/23 (88 y.o. Female) [name] Primary Care : Frazier Richards Other Clinician: Referring : Treating /Extender: [name] in Treatment: 0 Edema Assessment Left: [Left: Right] [Right: :] Assessed: [Left: Yes] [Right: No] [Left: Edema] [Right: :] Calf Left: Right: Point of Measurement: 30 cm From Medial Instep 33 cm Ankle Left: Right: Point of Measurement: 12 cm From Medial Instep 21 cm Knee To Floor Left: Right: From Medial Instep 40 cm Vascular Assessment Pulses: Dorsalis Pedis Palpable: [Left:Yes] Doppler Audible: [Left:Yes] Posterior Tibial Palpable: [Left:Yes] Doppler Audible: [Left:Yes] Blood Pressure: Brachial: [Left:109] Ankle: [Left:Dorsalis Pedis: 110 1.01] Electronic Signature([name]) Signed: 07/16/2022 3:46:10 PM By: [name], BSN, RN, CWS, [name] RN, BSN Entered By: [name], BSN, RN, CWS, [name] on 07/16/2022 99:24:26 -------------------------------------------------------------------------------- Multi Wound Chart Details Patient Name: Date of Service: [name] Royals, DO RCA [name] 07/16/2022 7:30 A M Medical Record Number: [id number] Patient Account Number: [id number] Date of Birth/Sex: Treating RN: [birth year] (88 y.o. Female) [name] Primary Care : Frazier Richards Other Clinician: Referring : Treating /Extender: [name] in Treatment: 0 Vital Signs Height(in): Pulse(bpm): 78 Weight(lbs): 106 Blood Pressure(mmHg): 134/71 Body Mass Index(BMI): Temperature(F): 98 Respiratory Rate(breaths/min): 16 [1:Photos:] [N/A:N/A] Left, Lateral Lower Leg N/A N/A Wound Location: Gradually Appeared N/A N/A Wounding Event: Venous Leg Ulcer N/A N/A Primary Etiology: Hypertension, Dementia, Received N/A N/A Comorbid  History: Chemotherapy 05/27/2022 N/A N/A Date Acquired: 0 N/A N/A Weeks of Treatment: Open N/A N/A Wound Status: No N/A N/A Wound Recurrence: 6.2x5.4x0.2 N/A N/A Measurements L x W x D (cm) 26.295 N/A N/A A (cm) : rea 5.259 N/A N/A Volume (cm) : Full Thickness Without Exposed N/A N/A Classification: Support Structures Large N/A N/A Exudate A mount: Serous N/A N/A Exudate Type: amber N/A N/A Exudate Color: Indistinct, nonvisible N/A N/A Wound Margin: None Present (0%) N/A N/A Granulation Amount: Large (67-100%) N/A N/A Necrotic Amount: Eschar, Adherent Slough N/A N/A Necrotic Tissue: Fat Layer (Subcutaneous Tissue): Yes N/A N/A Exposed Structures: Fascia: No Tendon: No Muscle: No Joint: No Bone: No None N/A N/A Epithelialization: Treatment Notes Electronic Signature([name]) Signed: 07/16/2022 3:46:10 PM By: [name], BSN, RN, CWS, [name] RN, BSN Entered By: [name],  BSN, RN, Marcy Siren on 07/16/2022 08:44:11 -------------------------------------------------------------------------------- Multi-Disciplinary Care Plan Details Patient Name: Date of Service: [name] Royals, DO RCA [name] 07/16/2022 7:30 A M Medical Record Number: [id number] Patient Account Number: [id number] Date of Birth/Sex: Treating RN: 07-Sep-1925 (88 y.o. Female) [name] Primary Care : Frazier Richards Other Clinician: Referring : Treating /Extender: [name] in Treatment: 0 Active Inactive Necrotic Tissue Nursing Diagnoses: Impaired tissue integrity related to necrotic/devitalized tissue Knowledge deficit related to management of necrotic/devitalized tissue Goals: Necrotic/devitalized tissue will be minimized in the wound bed Date Initiated: 07/16/2022 Target Resolution Date: 07/23/2022 Goal Status: Active Patient/caregiver will verbalize understanding of reason and process for debridement of necrotic tissue Date Initiated: 07/16/2022 Target  Resolution Date: 07/23/2022 Goal Status: Active MORAYO, LEVEN ([id number]) [id number] Page 6 of 10 Interventions: Assess patient pain level pre-, during and post procedure and prior to discharge Provide education on necrotic tissue and debridement process Treatment Activities: Apply topical anesthetic as ordered : 07/16/2022 Excisional debridement : 07/16/2022 Notes: Orientation to the Wound Care Program Nursing Diagnoses: Knowledge deficit related to the wound healing center program Goals: Patient/caregiver will verbalize understanding of the [place] Date Initiated: 07/16/2022 Target Resolution Date: 07/16/2022 Goal Status: Active Interventions: Provide education on orientation to the wound center Notes: Pain, Acute or Chronic Nursing Diagnoses: Pain Management - Non-cyclic Acute (Procedural) Potential alteration in comfort, pain Goals: Patient will verbalize adequate pain control and receive pain control interventions during procedures as needed Date Initiated: 07/16/2022 Target Resolution Date: 07/23/2022 Goal Status: Active Patient/caregiver will verbalize adequate pain control between visits Date Initiated: 07/16/2022 Target Resolution Date: 07/23/2022 Goal Status: Active Patient/caregiver will verbalize comfort level met Date Initiated: 07/16/2022 Target Resolution Date: 07/16/2022 Goal Status: Active Interventions: Reposition patient for comfort Treatment Activities: Administer pain control measures as ordered : 07/16/2022 Notes: Venous Leg Ulcer Nursing Diagnoses: Knowledge deficit related to disease process and management Potential for venous Insuffiency (use before diagnosis confirmed) Goals: Patient will maintain optimal edema control Date Initiated: 07/16/2022 Target Resolution Date: 07/23/2022 Goal Status: Active Patient/caregiver will verbalize understanding of disease process and disease  management Date Initiated: 07/16/2022 Target Resolution Date: 07/23/2022 Goal Status: Active Interventions: Assess peripheral edema status every visit. Compression as ordered Notes: Wound/Skin Impairment Nursing Diagnoses: Impaired tissue integrity Knowledge deficit related to smoking impact on wound healing Knowledge deficit related to ulceration/compromised skin integrity JENALEE, TREVIZO ([id number]) [id number] Page 7 of 10 Goals: Patient/caregiver will verbalize understanding of skin care regimen Date Initiated: 07/16/2022 Target Resolution Date: 07/16/2022 Goal Status: Active Ulcer/skin breakdown will have a volume reduction of 30% by week 4 Date Initiated: 07/16/2022 Target Resolution Date: 08/13/2022 Goal Status: Active Interventions: Assess patient/caregiver ability to obtain necessary supplies Assess patient/caregiver ability to perform ulcer/skin care regimen upon admission and as needed Assess ulceration([name]) every visit Provide education on ulcer and skin care Treatment Activities: Skin care regimen initiated : 07/16/2022 Topical wound management initiated : 07/16/2022 Notes: Electronic Signature([name]) Signed: 07/16/2022 3:46:10 PM By: [name], BSN, RN, CWS, [name] RN, BSN Entered By: [name], BSN, RN, CWS, [name] on 07/16/2022 08:53:20 -------------------------------------------------------------------------------- Pain Assessment Details Patient Name: Date of Service: [name] [name] Spikes, DO RCA [name] 07/16/2022 7:30 A M Medical Record Number: [id number] Patient Account Number: [id number] Date of Birth/Sex: Treating RN: 03-30-1926 (88 y.o. Female) [name] Primary Care : Frazier Richards Other Clinician: Referring : Treating /Extender: [name] in Treatment: 0 Active Problems Location of Pain Severity and Description of Pain Patient Has Paino No Site Locations Pain  Management and Medication Current Pain  Management: Notes DALEEN, STEINHAUS ([id number]) [id number] Page 8 of 10 Patient states, no pain,weeps a little. Electronic Signature([name]) Signed: 07/16/2022 3:46:10 PM By: [name], BSN, RN, CWS, [name] RN, BSN Entered By: [name], BSN, RN, CWS, [name] on 07/16/2022 07:56:19 -------------------------------------------------------------------------------- Patient/Caregiver Education Details Patient Name: Date of Service: [name] Royals, DO RCA [name] 12/21/2023andnbsp7:30 A M Medical Record Number: [id number] Patient Account Number: [id number] Date of Birth/Gender: Treating RN: 08/24/1925 (88 y.o. Female) [name] Primary Care Physician: Frazier Richards Other Clinician: Referring Physician: Treating Physician/Extender: [name] in Treatment: 0 Education Assessment Education Provided To: Patient Education Topics Provided Wound Debridement: Handouts: Wound Debridement Methods: Demonstration, Explain/Verbal Wound/Skin Impairment: Handouts: Caring for Your Ulcer, Other: wound care as prescribed Methods: Explain/Verbal Responses: State content correctly Electronic Signature([name]) Signed: 07/16/2022 3:46:10 PM By: [name], BSN, RN, CWS, [name] RN, BSN Entered By: [name], BSN, RN, CWS, [name] on 07/16/2022 08:50:46 -------------------------------------------------------------------------------- Wound Assessment Details Patient Name: Date of Service: [name] [name] Spikes, DO RCA [name] 07/16/2022 7:30 A M Medical Record Number: [id number] Patient Account Number: [id number] Date of Birth/Sex: Treating RN: Sep 25, 1925 (88 y.o. Female) [name] Primary Care Neveen Daponte: Frazier Richards Other Clinician: Referring Evamaria Detore: Treating Altheia Shafran/Extender: Elvina Sidle Weeks in Treatment: 0 Wound Status Wound Number: 1 Primary Etiology: Venous Leg Ulcer Wound Location: Left, Lateral Lower Leg Wound Status: Open MONISHA, SIEBEL ([id number])  [id number] Page 9 of 10 Wounding Event: Gradually Appeared Comorbid History: Hypertension, Dementia, Received Chemotherapy Date Acquired: 05/27/2022 Weeks Of Treatment: 0 Clustered Wound: No Photos Wound Measurements Length: (cm) 6.2 Width: (cm) 5.4 Depth: (cm) 0.2 Area: (cm) 26.295 Volume: (cm) 5.259 % Reduction in Area: % Reduction in Volume: Epithelialization: None Tunneling: No Undermining: No Wound Description Classification: Full Thickness Without Exposed Support Structures Wound Margin: Indistinct, nonvisible Exudate Amount: Large Exudate Type: Serous Exudate Color: amber Foul Odor After Cleansing: No Slough/Fibrino Yes Wound Bed Granulation Amount: None Present (0%) Exposed Structure Necrotic Amount: Large (67-100%) Fascia Exposed: No Necrotic Quality: Eschar, Adherent Slough Fat Layer (Subcutaneous Tissue) Exposed: Yes Tendon Exposed: No Muscle Exposed: No Joint Exposed: No Bone Exposed: No Treatment Notes Wound #1 (Lower Leg) Wound Laterality: Left, Lateral Cleanser Soap and Water Discharge Instruction: Gently cleanse wound with antibacterial soap, rinse and pat dry prior to dressing wounds Peri-Wound Care Topical Primary Dressing Silvercel 4 1/4x 4 1/4 (in/in) Discharge Instruction: Apply Silvercel 4 1/4x 4 1/4 (in/in) as instructed Secondary Dressing Zetuvit Plus 4x4 (in/in) Secured With Compression Wrap 3-LAYER WRAP - Profore Lite LF 3 Multilayer Compression Bandaging System Discharge Instruction: Apply 3 multi-layer wrap as prescribed. If wrap slides more than 1 inch (mark on leg) call wound care for re-wrap. Compression Stockings Environmental education officer) Signed: 07/16/2022 3:46:10 PM By: [name], BSN, RN, CWS, [name] RN, BSN Entered By: [name], BSN, RN, CWS, [name] on 07/16/2022 08:43:02 [name] ([id number]) [id number] Page 10 of  10 -------------------------------------------------------------------------------- Vitals Details Patient Name: Date of Service: [name] Royals, DO RCA [name] 07/16/2022 7:30 A M Medical Record Number: [id number] Patient Account Number: [id number] Date of Birth/Sex: Treating RN: [birth year] (88 y.o. Female) [name] Primary Care Elwin Tsou: Frazier Richards Other Clinician: Referring Bronc Brosseau: Treating Haniah Penny/Extender: [name] in Treatment: 0 Vital Signs Time Taken: 07:56 Temperature (F): 98 Weight (lbs): 106 Pulse (bpm): 78 Respiratory Rate (breaths/min): 16 Blood Pressure (mmHg): 134/71 Reference Range: 80 - 120 mg / dl Electronic Signature([name]) Signed: 07/16/2022 3:46:10 PM By: [name], BSN, RN, CWS, [name] RN, BSN Entered By: [name], BSN, RN,  CWS, [name] on 07/16/2022 07:57:01

## 2022-07-16 NOTE — Progress Notes (Signed)
[name] ([id number]) [id number] Page 1 of 5 Visit Report for 07/16/2022 Abuse Risk Screen Details Patient Name: Date of Service: [name] Royals, DO RCA [name] 07/16/2022 7:30 A M Medical Record Number: [id number] Patient Account Number: [id number] Date of Birth/Sex: Treating RN: [birth year] (88 y.o. Female) Cornell Barman Primary Care Raylyn Speckman: Frazier Richards Other Clinician: Referring Thanh Mottern: Treating Camrin Lapre/Extender: [name] in Treatment: 0 Abuse Risk Screen Items Answer ABUSE RISK SCREEN: Has anyone close to you tried to hurt or harm you recentlyo No Do you feel uncomfortable with anyone in your familyo No Has anyone forced you do things that you didnt want to doo No Electronic Signature([name]) Signed: 07/16/2022 3:46:10 PM By: Gretta Cool, BSN, RN, CWS, [name] RN, BSN Entered By: Gretta Cool, BSN, RN, CWS, [name] on 07/16/2022 08:13:23 -------------------------------------------------------------------------------- Activities of Daily Living Details Patient Name: Date of Service: [name] 07/16/2022 7:30 A M Medical Record Number: [id number] Patient Account Number: [id number] Date of Birth/Sex: Treating RN: [birth year] (88 y.o. Female) Cornell Barman Primary Care Ziana Heyliger: Frazier Richards Other Clinician: Referring Tayshaun Kroh: Treating Celso Granja/Extender: [name] in Treatment: 0 Activities of Daily Living Items Answer Activities of Daily Living (Please select one for each item) Drive Automobile Not Able T Medications ake Need Assistance Use T elephone Completely Able Care for Appearance Need Assistance Use T oilet Need Assistance Bath / Shower Need Assistance Dress Self Need Assistance Feed Self Need Assistance Walk Need Assistance Get In / Out Bed Need Assistance Housework Need Assistance [place], [place] ([id number]) [id number] Page 2 of 5 Prepare Meals Need  Assistance Handle Money Need Assistance Shop for Self Need Radio producer) Signed: 07/16/2022 3:46:10 PM By: Gretta Cool, BSN, RN, CWS, [name] RN, BSN Entered By: Gretta Cool, BSN, RN, CWS, [name] on 07/16/2022 08:13:40 -------------------------------------------------------------------------------- Education Screening Details Patient Name: Date of Service: [name] 07/16/2022 7:30 A M Medical Record Number: [id number] Patient Account Number: [id number] Date of Birth/Sex: Treating RN: 1925/10/04 (88 y.o. Female) Cornell Barman Primary Care Ahriyah Vannest: Frazier Richards Other Clinician: Referring Anothy Bufano: Treating Alee Gressman/Extender: [name] in Treatment: 0 Primary Learner Assessed: Patient Learning Preferences/Education Level/Primary Language Preferred Language: English Cognitive Barrier Language Barrier: No Translator Needed: No Memory Deficit: No Emotional Barrier: No Physical Barrier Impaired Vision: No Impaired Hearing: Yes Decreased Hand dexterity: No Knowledge/Comprehension Knowledge Level: Medium Comprehension Level: Medium Ability to understand written instructions: Medium Ability to understand verbal instructions: Medium Motivation Anxiety Level: Calm Cooperation: Cooperative Education Importance: Acknowledges Need Interest in Health Problems: Asks Questions Perception: Coherent Willingness to Engage in Self-Management High Activities: Readiness to Engage in Self-Management High Activities: Engineer, maintenance) Signed: 07/16/2022 3:46:10 PM By: Gretta Cool, BSN, RN, CWS, [name] RN, BSN Entered By: Gretta Cool, BSN, RN, CWS, [name] on 07/16/2022 08:14:08 [name] ([id number]) [phone] [id number] Page 3 of 5 -------------------------------------------------------------------------------- Fall Risk Assessment Details Patient Name: Date of Service: [name] Royals, DO RCA [name] 07/16/2022 7:30 A M Medical Record Number:  [id number] Patient Account Number: [id number] Date of Birth/Sex: Treating RN: Dec 25, 1925 (88 y.o. Female) Cornell Barman Primary Care Yussuf Sawyers: Frazier Richards Other Clinician: Referring Miyako Oelke: Treating Briceyda Abdullah/Extender: [name] in Treatment: 0 Fall Risk Assessment Items Have you had 2 or more falls in the last 12 monthso 0 Yes Have you had any fall that resulted in injury in the last 12 monthso 0 No FALLS RISK SCREEN History of falling - immediate or within 3 months 25 Yes Secondary diagnosis (Do you have 2 or more medical  diagnoseso) 0 No Ambulatory aid None/bed rest/wheelchair/nurse 0 Yes Crutches/cane/walker 15 Yes Furniture 0 No Intravenous therapy Access/Saline/Heparin Lock 0 No Gait/Transferring Normal/ bed rest/ wheelchair 0 Yes Weak (short steps with or without shuffle, stooped but able to lift head while walking, may seek 10 Yes support from furniture) Impaired (short steps with shuffle, may have difficulty arising from chair, head down, impaired 0 No balance) Mental Status Oriented to own ability 0 Yes Electronic Signature([name]) Signed: 07/16/2022 3:46:10 PM By: Gretta Cool, BSN, RN, CWS, [name] RN, BSN Entered By: Gretta Cool, BSN, RN, CWS, [name] on 07/16/2022 08:14:57 -------------------------------------------------------------------------------- Foot Assessment Details Patient Name: Date of Service: [name] 07/16/2022 7:30 A M Medical Record Number: [id number] Patient Account Number: [id number] Date of Birth/Sex: Treating RN: [birth year] (88 y.o. Female) Cornell Barman Primary Care [place] Mielke: Frazier Richards Other Clinician: Referring Jurell Basista: Treating Bentley Haralson/Extender: [name] in Treatment: 0 Foot Assessment Items Site Locations [place], [place] ([id number]) [id number] Page 4 of 5 + = Sensation present, - = Sensation absent, C = Callus, U = Ulcer R = Redness, W = Warmth, M =  Maceration, PU = Pre-ulcerative lesion F = Fissure, [name] = Swelling, D = Dryness Assessment Right: Left: Other Deformity: No No Prior Foot Ulcer: No No Prior Amputation: No No Charcot Joint: No No Ambulatory Status: Ambulatory With Help Assistance Device: Walker GaitEnergy manager) Signed: 07/16/2022 3:46:10 PM By: Gretta Cool, BSN, RN, CWS, [name] RN, BSN Entered By: Gretta Cool, BSN, RN, CWS, [name] on 07/16/2022 08:15:33 -------------------------------------------------------------------------------- Nutrition Risk Screening Details Patient Name: Date of Service: [name] 07/16/2022 7:30 A M Medical Record Number: [id number] Patient Account Number: [id number] Date of Birth/Sex: Treating RN: [birth year] (88 y.o. Female) Cornell Barman Primary Care Lynae Pederson: Frazier Richards Other Clinician: Referring Edyn Qazi: Treating Askia Hazelip/Extender: [name] in Treatment: 0 Height (in): Weight (lbs): 106 Body Mass Index (BMI): Nutrition Risk Screening Items Score Screening NUTRITION RISK SCREEN: I have an illness or condition that made me change the kind and/or amount of food I eat 0 No I eat fewer than two meals per day 0 No I eat few fruits and vegetables, or milk products 0 No I have three or more drinks of beer, liquor or wine almost every day 0 No I have tooth or mouth problems that make it hard for me to eat 0 No SHARIE, AMORIN ([id number]) [id number] Page 5 of 5 I don't always have enough money to buy the food I need 0 No I eat alone most of the time 0 No I take three or more different prescribed or over-the-counter drugs a day 1 Yes Without wanting to, I have lost or gained 10 pounds in the last six months 0 No I am not always physically able to shop, cook and/or feed myself 0 No Nutrition Protocols Good Risk Protocol 0 No interventions needed Moderate Risk Protocol High Risk Proctocol Risk Level: Good  Risk Score: 1 Electronic Signature([name]) Signed: 07/16/2022 3:46:10 PM By: Gretta Cool, BSN, RN, CWS, [name] RN, BSN Entered By: Gretta Cool, BSN, RN, CWS, [name] on 07/16/2022 08:15:10

## 2022-07-16 NOTE — Progress Notes (Signed)
KIERSTAN, AUER ([id number]) [id number] Page 1 of 9 Visit Report for 07/16/2022 Chief Complaint Document Details Patient Name: Date of Service: [name] [name], [name] [name] 07/16/2022 7:30 A M Medical Record Number: [id number] Patient Account Number: [id number] Date of Birth/Sex: Treating [name]: 1925/10/22 (88 y.o. Female) [name] [name] Primary Care Provider: Frazier [name] Other Clinician: Referring Provider: Treating Provider/Extender: [name] [name] in Treatment: 0 Information Obtained from: Patient Chief Complaint Left lateral LE ulcer Electronic Signature([name]) Signed: 07/16/2022 8:35:08 AM By: [name] [name] [name] [name] Entered By: [name] [name] on 07/16/2022 08:35:08 -------------------------------------------------------------------------------- Debridement Details Patient Name: Date of Service: [name] [name], [name] [name] 07/16/2022 7:30 A M Medical Record Number: [id number] Patient Account Number: [id number] Date of Birth/Sex: Treating [name]: 05-12-1926 (88 y.o. Female) [name] [name] Primary Care Provider: Frazier [name] Other Clinician: Referring Provider: Treating Provider/Extender: [name] [name] in Treatment: 0 Debridement Performed for Assessment: Wound #1 Left,Lateral Lower Leg Performed By: Physician [name] Sams., [name] [name] Debridement Type: Debridement Severity of Tissue Pre Debridement: Fat layer exposed Level of Consciousness (Pre-procedure): Awake and Alert Pre-procedure Verification/Time Out Yes - 08:46 Taken: T Area Debrided (L x W): otal 6.2 (cm) x 5.4 (cm) = 33.48 (cm) Tissue and other material debrided: Viable, Non-Viable, Slough, Subcutaneous, Biofilm, Fibrin/Exudate, Slough Level: Skin/Subcutaneous Tissue Debridement Description: Excisional Instrument: Curette Bleeding: Minimum Hemostasis Achieved: Pressure Response to Treatment: Procedure was tolerated well Level of Consciousness (Post- Awake and  Alert procedure): Post Debridement Measurements of Total Wound [name] [name], [name] [name] ([id number]) [id number] Page 2 of 9 Length: (cm) 6.2 Width: (cm) 5.6 Depth: (cm) 0.2 Volume: (cm) 5.454 Character of Wound/Ulcer Post Debridement: Stable Severity of Tissue Post Debridement: Fat layer exposed Post Procedure Diagnosis Same as Pre-procedure Electronic Signature([name]) Signed: 07/16/2022 3:46:10 PM By: [name] [name], BSN, [name], [name] [name], [name] [name], [name] [name] Signed: 07/16/2022 4:23:52 PM By: [name] [name] [name] [name] Entered By: [name] [name], BSN, [name], [name] [name], [name] on 07/16/2022 08:47:32 -------------------------------------------------------------------------------- HPI Details Patient Name: Date of Service: [name] [name], [name] [name] 07/16/2022 7:30 A M Medical Record Number: [id number] Patient Account Number: [id number] Date of Birth/Sex: Treating [name]: Oct 30, 1925 (88 y.o. Female) [name] [name] Primary Care Provider: Frazier [name] Other Clinician: Referring Provider: Treating Provider/Extender: [name] [name] in Treatment: 0 History of Present Illness HPI Description: 07-16-2022 upon evaluation today patient presents for initial inspection here in our clinic concerning a wound on the left lateral lower extremity. This is an area that has been present since around May 27, 2022 according to what the patient tells me today. With that being said she does live in assisted living facility. She is seen with her son in the office today. As far as past medical history is concerned she does have a history of breast cancer in 2003 which she did obviously survive. She also had a right knee replacement. In 2009 she had an IVC filter and heart ablation due to atrial fibrillation. With regard to the remainder of her medical history again she is on anticoagulant therapy at this point due to atrial fibrillation, she has hypertension, and chronic venous insufficiency based on what I am seeing currently. In  regard to the wound she is currently on Keflex which should be completed on the 25th of this month. Other than that she has had various things applied to the wound including different creams but then at other times she also states that she has had nothing put on and been told to just leave it open to air. There is been a lot of  confusion about the best treatment course with regard to her wound which is obviously not good. I discussed with her today that I think we can have the best plan going forward and that that will include keeping this covered but nonetheless it should help it to heal the most effectively as well. Electronic Signature([name]) Signed: 07/16/2022 4:22:06 PM By: [name] [name] [name] [name] Entered By: [name] [name] on 07/16/2022 16:22:06 -------------------------------------------------------------------------------- Physical Exam Details Patient Name: Date of Service: [name] [name], [name] [name] 07/16/2022 7:30 A M Medical Record Number: [id number] Patient Account Number: [id number] Date of Birth/Sex: Treating [name]: [birth year] (88 y.o. Female) [name] [name] [name] [name], [name] [name] ([id number]) [id number] Page 3 of 9 Primary Care Provider: Frazier [name] Other Clinician: Referring Provider: Treating Provider/Extender: [name] [name] in Treatment: 0 Constitutional sitting or standing blood pressure is within target range for patient.. pulse regular and within target range for patient.Marland Kitchen respirations regular, non-labored and within target range for patient.Marland Kitchen temperature within target range for patient.. Well-nourished and well-hydrated in no acute distress. Eyes conjunctiva clear no eyelid edema noted. pupils equal round and reactive to light and accommodation. Ears, Nose, Mouth, and Throat no gross abnormality of ear auricles or external auditory canals. normal hearing noted during conversation. mucus membranes moist. Respiratory normal breathing without  difficulty. Cardiovascular 1+ dorsalis pedis/posterior tibialis pulses. 1+ pitting edema of the bilateral lower extremities. Musculoskeletal normal gait and posture. no significant deformity or arthritic changes, no loss or range of motion, no clubbing. Psychiatric this patient is able to make decisions and demonstrates good insight into disease process. Alert and Oriented x 3. pleasant and cooperative. Notes Upon inspection patient'[name] wound bed actually showed signs of fairly good granulation in some areas of the wound and she had a lot of slough and biofilm buildup and others. I did actually perform debridement to clearway the necrotic debris today and she tolerated that without complication other than some minimal discomfort but again I was very lightheaded as far as that is concerned she tolerated it quite well. Postdebridement this appears to be doing much better which is great news. I [name] believe that she does have some edema and I believe if we get this under control the wound will actually heal much more effectively. Electronic Signature([name]) Signed: 07/16/2022 4:22:46 PM By: [name] [name] [name] [name] Entered By: [name] [name] on 07/16/2022 16:22:45 -------------------------------------------------------------------------------- Physician Orders Details Patient Name: Date of Service: [name] [name], [name] [name] 07/16/2022 7:30 A M Medical Record Number: [id number] Patient Account Number: [id number] Date of Birth/Sex: Treating [name]: 1925/09/25 (88 y.o. Female) [name] [name] Primary Care Provider: Frazier [name] Other Clinician: Referring Provider: Treating Provider/Extender: [name] [name] in Treatment: 0 Verbal / Phone Orders: No Diagnosis Coding ICD-10 Coding Code Description [id number] Chronic venous hypertension (idiopathic) with ulcer and inflammation of left lower extremity L97.822 Non-pressure chronic ulcer of other part of left lower leg with fat layer exposed I10  Essential (primary) hypertension I48.0 Paroxysmal atrial fibrillation Z79.01 Long term (current) use of anticoagulants Follow-up Appointments Wound #1 Left,Lateral Lower Leg Return Appointment in 1 week. MADDISYN, HEGWOOD ([id number]) [id number] Page 4 of 9 Nurse Visit as needed Bathing/ Shower/ Hygiene May shower with wound dressing protected with water repellent cover or cast protector. Anesthetic (Use 'Patient Medications' Section for Anesthetic Order Entry) Lidocaine applied to wound bed Edema Control - Lymphedema / Segmental Compressive Device / Other Left Lower Extremity Elevate, Exercise Daily and A void Standing for Long Periods of Time. Elevate legs to the  level of the heart and pump ankles as often as possible Elevate leg([name]) parallel to the floor when sitting. Additional Orders / Instructions Follow Nutritious Diet and Increase Protein Intake Medications-Please add to medication list. ntibiotics - complete antibiotics P.O. A Wound Treatment Wound #1 - Lower Leg Wound Laterality: Left, Lateral Cleanser: Soap and Water 1 x Per Week/30 Days Discharge Instructions: Gently cleanse wound with antibacterial soap, rinse and pat dry prior to dressing wounds Prim Dressing: Silvercel 4 1/4x 4 1/4 (in/in) 1 x Per Week/30 Days ary Discharge Instructions: Apply Silvercel 4 1/4x 4 1/4 (in/in) as instructed Secondary Dressing: Zetuvit Plus 4x4 (in/in) 1 x Per Week/30 Days Compression Wrap: 3-LAYER WRAP - Profore Lite LF 3 Multilayer Compression Bandaging System 1 x Per Week/30 Days Discharge Instructions: Apply 3 multi-layer wrap as prescribed. If wrap slides more than 1 inch (mark on leg) call wound care for re-wrap. Electronic Signature([name]) Signed: 07/16/2022 3:46:10 PM By: [name] [name], BSN, [name], [name] [name], [name] [name], [name] [name] Signed: 07/16/2022 4:23:52 PM By: [name] [name] [name] [name] Entered By: [name] [name] BSN, [name], [name] [name], [name] on 07/16/2022  09:07:39 -------------------------------------------------------------------------------- Problem List Details Patient Name: Date of Service: [name] [name], [name] [name] 07/16/2022 7:30 A M Medical Record Number: [id number] Patient Account Number: [id number] Date of Birth/Sex: Treating [name]: 07-Aug-1925 (88 y.o. Female) [name] [name] Primary Care Provider: Frazier [name] Other Clinician: Referring Provider: Treating Provider/Extender: [name] [name] in Treatment: 0 Active Problems ICD-10 Encounter Code Description Active Date MDM Diagnosis I87.332 Chronic venous hypertension (idiopathic) with ulcer and inflammation of left 07/16/2022 No Yes lower extremity L97.822 Non-pressure chronic ulcer of other part of left lower leg with fat layer exposed12/21/2023 No Yes CERINITY, ZYNDA ([id number]) [id number] Page 5 of 9 I10 Essential (primary) hypertension 07/16/2022 No Yes I48.0 Paroxysmal atrial fibrillation 07/16/2022 No Yes Z79.01 Long term (current) use of anticoagulants 07/16/2022 No Yes Inactive Problems Resolved Problems Electronic Signature([name]) Signed: 07/16/2022 8:38:20 AM By: [name] [name] [name] [name] Previous Signature: 07/16/2022 8:34:30 AM Version By: [name] [name] [name] [name] Entered By: [name] [name] on 07/16/2022 08:38:20 -------------------------------------------------------------------------------- Progress Note Details Patient Name: Date of Service: [name] [name], [name] [name] 07/16/2022 7:30 A M Medical Record Number: [id number] Patient Account Number: [id number] Date of Birth/Sex: Treating [name]: 1925/10/05 (88 y.o. Female) [name] [name] Primary Care Provider: Frazier [name] Other Clinician: Referring Provider: Treating Provider/Extender: [name] [name] in Treatment: 0 Subjective Chief Complaint Information obtained from Patient Left lateral LE ulcer History of Present Illness (HPI) 07-16-2022 upon evaluation today  patient presents for initial inspection here in our clinic concerning a wound on the left lateral lower extremity. This is an area that has been present since around May 27, 2022 according to what the patient tells me today. With that being said she does live in assisted living facility. She is seen with her son in the office today. As far as past medical history is concerned she does have a history of breast cancer in 2003 which she did obviously survive. She also had a right knee replacement. In 2009 she had an IVC filter and heart ablation due to atrial fibrillation. With regard to the remainder of her medical history again she is on anticoagulant therapy at this point due to atrial fibrillation, she has hypertension, and chronic venous insufficiency based on what I am seeing currently. In regard to the wound she is currently on Keflex which should be completed on the 25th of this month. Other than that she has had various things applied to  the wound including different creams but then at other times she also states that she has had nothing put on and been told to just leave it open to air. There is been a lot of confusion about the best treatment course with regard to her wound which is obviously not good. I discussed with her today that I think we can have the best plan going forward and that that will include keeping this covered but nonetheless it should help it to heal the most effectively as well. Patient History Unable to Obtain Patient History due to Dementia. Information obtained from Patient, Caregiver. Allergies erythromycin base, Sulfa (Sulfonamide Antibiotics), wheat Social History Former smoker, Marital Status - Widowed, Alcohol Use - Never, Drug Use - No History, Caffeine Use - Never. Medical History Cardiovascular [name] [name], OGBURN ([id number]) [id number] Page 6 of 9 Patient has history of Hypertension Endocrine Denies history of Type II  Diabetes Neurologic Patient has history of Dementia - mild Oncologic Patient has history of Received Chemotherapy Medical A Surgical History Notes nd Oncologic breast cancer 2013 Review of Systems (ROS) Cardiovascular Complains or has symptoms of LE edema. Gastrointestinal Denies complaints or symptoms of Frequent diarrhea, Nausea, Vomiting. Genitourinary Complains or has symptoms of Incontinence/dribbling. Denies complaints or symptoms of Kidney failure/ Dialysis. Integumentary (Skin) Complains or has symptoms of Wounds. Musculoskeletal Denies complaints or symptoms of Muscle Pain, Muscle Weakness, right knee replacement Objective Constitutional sitting or standing blood pressure is within target range for patient.. pulse regular and within target range for patient.Marland Kitchen respirations regular, non-labored and within target range for patient.Marland Kitchen temperature within target range for patient.. Well-nourished and well-hydrated in no acute distress. Vitals Time Taken: 7:56 AM, Weight: 106 lbs, Temperature: 98 F, Pulse: 78 bpm, Respiratory Rate: 16 breaths/min, Blood Pressure: 134/71 mmHg. Eyes conjunctiva clear no eyelid edema noted. pupils equal round and reactive to light and accommodation. Ears, Nose, Mouth, and Throat no gross abnormality of ear auricles or external auditory canals. normal hearing noted during conversation. mucus membranes moist. Respiratory normal breathing without difficulty. Cardiovascular 1+ dorsalis pedis/posterior tibialis pulses. 1+ pitting edema of the bilateral lower extremities. Musculoskeletal normal gait and posture. no significant deformity or arthritic changes, no loss or range of motion, no clubbing. Psychiatric this patient is able to make decisions and demonstrates good insight into disease process. Alert and Oriented x 3. pleasant and cooperative. General Notes: Upon inspection patient'[name] wound bed actually showed signs of fairly good granulation in  some areas of the wound and she had a lot of slough and biofilm buildup and others. I did actually perform debridement to clearway the necrotic debris today and she tolerated that without complication other than some minimal discomfort but again I was very lightheaded as far as that is concerned she tolerated it quite well. Postdebridement this appears to be doing much better which is great news. I [name] believe that she does have some edema and I believe if we get this under control the wound will actually heal much more effectively. Integumentary (Hair, Skin) Wound #1 status is Open. Original cause of wound was Gradually Appeared. The date acquired was: 05/27/2022. The wound is located on the Left,Lateral Lower Leg. The wound measures 6.2cm length x 5.4cm width x 0.2cm depth; 26.295cm^2 area and 5.259cm^3 volume. There is Fat Layer (Subcutaneous Tissue) exposed. There is no tunneling or undermining noted. There is a large amount of serous drainage noted. The wound margin is indistinct and nonvisible. There is no granulation within the wound bed. There is a  large (67-100%) amount of necrotic tissue within the wound bed including Eschar and Adherent Slough. Assessment Active Problems ICD-10 Chronic venous hypertension (idiopathic) with ulcer and inflammation of left lower extremity Non-pressure chronic ulcer of other part of left lower leg with fat layer exposed Essential (primary) hypertension Paroxysmal atrial fibrillation Long term (current) use of anticoagulants GIRTIE, WIERSMA ([id number]) [id number] Page 7 of 9 Procedures Wound #1 Pre-procedure diagnosis of Wound #1 is a Venous Leg Ulcer located on the Left,Lateral Lower Leg .Severity of Tissue Pre Debridement is: Fat layer exposed. There was a Excisional Skin/Subcutaneous Tissue Debridement with a total area of 33.48 sq cm performed by [name] Sams., [name] [name]. With the following instrument([name]): Curette to remove  Viable and Non-Viable tissue/material. Material removed includes Subcutaneous Tissue, Slough, Biofilm, and Fibrin/Exudate. No specimens were taken. A time out was conducted at 08:46, prior to the start of the procedure. A Minimum amount of bleeding was controlled with Pressure. The procedure was tolerated well. Post Debridement Measurements: 6.2cm length x 5.6cm width x 0.2cm depth; 5.454cm^3 volume. Character of Wound/Ulcer Post Debridement is stable. Severity of Tissue Post Debridement is: Fat layer exposed. Post procedure Diagnosis Wound #1: Same as Pre-Procedure Plan Follow-up Appointments: Wound #1 Left,Lateral Lower Leg: Return Appointment in 1 week. Nurse Visit as needed Bathing/ Shower/ Hygiene: May shower with wound dressing protected with water repellent cover or cast protector. Anesthetic (Use 'Patient Medications' Section for Anesthetic Order Entry): Lidocaine applied to wound bed Edema Control - Lymphedema / Segmental Compressive Device / Other: Elevate, Exercise Daily and Avoid Standing for Long Periods of Time. Elevate legs to the level of the heart and pump ankles as often as possible Elevate leg([name]) parallel to the floor when sitting. Additional Orders / Instructions: Follow Nutritious Diet and Increase Protein Intake Medications-Please add to medication list.: P.O. Antibiotics - complete antibiotics WOUND #1: - Lower Leg Wound Laterality: Left, Lateral Cleanser: Soap and Water 1 x Per Week/30 Days Discharge Instructions: Gently cleanse wound with antibacterial soap, rinse and pat dry prior to dressing wounds Prim Dressing: Silvercel 4 1/4x 4 1/4 (in/in) 1 x Per Week/30 Days ary Discharge Instructions: Apply Silvercel 4 1/4x 4 1/4 (in/in) as instructed Secondary Dressing: Zetuvit Plus 4x4 (in/in) 1 x Per Week/30 Days Com pression Wrap: 3-LAYER WRAP - Profore Lite LF 3 Multilayer Compression Bandaging System 1 x Per Week/30 Days Discharge Instructions: Apply 3 multi-layer  wrap as prescribed. If wrap slides more than 1 inch (mark on leg) call wound care for re-wrap. 1. Based on what I see I [name] believe that we should go ahead and initiate treatment with a silver alginate dressing which I think should [name] quite well. 2. Will use Zetuvit to cover. 3. I am also going to suggest a 3 layer compression wrap to help with edema control this will stay in place to we see her next week. 4. I did advise the patient and her son as well as in the paperwork that the main thing the facility needs to [name] is keep this from getting wet. I [name] believe a cast protector could be beneficial in that regard. We will see patient back for reevaluation in 1 week here in the clinic. If anything worsens or changes patient will contact our office for additional recommendations. Electronic Signature([name]) Signed: 07/16/2022 4:23:23 PM By: [name] [name] [name] [name] Entered By: [name] [name] on 07/16/2022 16:23:22 -------------------------------------------------------------------------------- ROS/PFSH Details Patient Name: Date of Service: [name] [name], [name] [name] 07/16/2022 7:30 A M Medical Record Number:  [id number] Patient Account Number: [id number] Date of Birth/Sex: Treating [name]: [birth year] (88 y.o. Female) [name] [name] Primary Care Provider: Frazier [name] Other Clinician: Referring Provider: Treating Provider/Extender: [name] [name] in Treatment: 0 [place], [place] ([id number]) [id number] Page 8 of 9 Unable to Obtain Patient History due to Dementia Information Obtained From Patient Caregiver Cardiovascular Complaints and Symptoms: Positive for: LE edema Medical History: Positive for: Hypertension Gastrointestinal Complaints and Symptoms: Negative for: Frequent diarrhea; Nausea; Vomiting Genitourinary Complaints and Symptoms: Positive for: Incontinence/dribbling Negative for: Kidney failure/ Dialysis Integumentary (Skin) Complaints and  Symptoms: Positive for: Wounds Musculoskeletal Complaints and Symptoms: Negative for: Muscle Pain; Muscle Weakness Review of System Notes: right knee replacement Endocrine Medical History: Negative for: Type II Diabetes Neurologic Medical History: Positive for: Dementia - mild Oncologic Medical History: Positive for: Received Chemotherapy Past Medical History Notes: breast cancer 2013 Immunizations Pneumococcal Vaccine: Received Pneumococcal Vaccination: Yes Received Pneumococcal Vaccination On or After 60th Birthday: Yes Implantable Devices None Family and Social History Former smoker; Marital Status - Widowed; Alcohol Use: Never; Drug Use: No History; Caffeine Use: Never Electronic Signature([name]) Signed: 07/16/2022 3:46:10 PM By: [name] [name], BSN, [name], [name] [name], [name] [name], [name] [name] Signed: 07/16/2022 4:23:52 PM By: [name] [name] [name] [name] Entered By: [name] [name] BSN, [name], [name] [name], [name] on 07/16/2022 08:13:06 [name] ([id number]) [id number] Page 9 of 9 -------------------------------------------------------------------------------- SuperBill Details Patient Name: Date of Service: [name] [name], [name] [name] 07/16/2022 Medical Record Number: [id number] Patient Account Number: [id number] Date of Birth/Sex: Treating [name]: 1926/02/16 (88 y.o. Female) [name] [name] Primary Care Provider: Frazier [name] Other Clinician: Referring Provider: Treating Provider/Extender: [name] [name] in Treatment: 0 Diagnosis Coding ICD-10 Codes Code Description [phone] Chronic venous hypertension (idiopathic) with ulcer and inflammation of left lower extremity L97.822 Non-pressure chronic ulcer of other part of left lower leg with fat layer exposed I10 Essential (primary) hypertension I48.0 Paroxysmal atrial fibrillation Z79.01 Long term (current) use of anticoagulants Facility Procedures : CPT4 Code: 80321224 9 Description: [place] VISIT-LEV 3 EST  PT Modifier: Quantity: 1 : CPT4 Code: 82500370 1 Description: 4888 - DEB SUBQ TISSUE 20 SQ CM/< ICD-10 Diagnosis Description L97.822 Non-pressure chronic ulcer of other part of left lower leg with fat layer exposed Modifier: Quantity: 1 : CPT4 Code: 91694503 1 Description: 8882 - DEB SUBQ TISS EA ADDL 20CM ICD-10 Diagnosis Description L97.822 Non-pressure chronic ulcer of other part of left lower leg with fat layer exposed Modifier: Quantity: 1 Physician Procedures : CPT4 Code Description Modifier [id number] [place] PHYS LEVEL 3 NEW PT 25 ICD-10 Diagnosis Description I87.332 Chronic venous hypertension (idiopathic) with ulcer and inflammation of left lower extremity L97.822 Non-pressure chronic ulcer of other part of left  lower leg with fat layer exposed I10 Essential (primary) hypertension I48.0 Paroxysmal atrial fibrillation Quantity: 1 : [id number] 11042 - WC PHYS SUBQ TISS 20 SQ CM ICD-10 Diagnosis Description L97.822 Non-pressure chronic ulcer of other part of left lower leg with fat layer exposed Quantity: 1 : 9794801 65537 - WC PHYS SUBQ TISS EA ADDL 20 CM ICD-10 Diagnosis Description L97.822 Non-pressure chronic ulcer of other part of left lower leg with fat layer exposed Quantity: 1 Electronic Signature([name]) Signed: 07/16/2022 4:23:39 PM By: [name] [name] [name] [name] Entered By: [name] [name] on 07/16/2022 16:23:38

## 2022-07-17 DIAGNOSIS — F0390 Unspecified dementia without behavioral disturbance: Secondary | ICD-10-CM | POA: Diagnosis not present

## 2022-07-17 DIAGNOSIS — M6281 Muscle weakness (generalized): Secondary | ICD-10-CM | POA: Diagnosis not present

## 2022-07-17 DIAGNOSIS — Z9181 History of falling: Secondary | ICD-10-CM | POA: Diagnosis not present

## 2022-07-17 DIAGNOSIS — F0280 Dementia in other diseases classified elsewhere without behavioral disturbance: Secondary | ICD-10-CM | POA: Diagnosis not present

## 2022-07-17 DIAGNOSIS — R2689 Other abnormalities of gait and mobility: Secondary | ICD-10-CM | POA: Diagnosis not present

## 2022-07-17 DIAGNOSIS — R279 Unspecified lack of coordination: Secondary | ICD-10-CM | POA: Diagnosis not present

## 2022-07-21 DIAGNOSIS — F0280 Dementia in other diseases classified elsewhere without behavioral disturbance: Secondary | ICD-10-CM | POA: Diagnosis not present

## 2022-07-21 DIAGNOSIS — Z9181 History of falling: Secondary | ICD-10-CM | POA: Diagnosis not present

## 2022-07-21 DIAGNOSIS — R279 Unspecified lack of coordination: Secondary | ICD-10-CM | POA: Diagnosis not present

## 2022-07-21 DIAGNOSIS — F0390 Unspecified dementia without behavioral disturbance: Secondary | ICD-10-CM | POA: Diagnosis not present

## 2022-07-21 DIAGNOSIS — M6281 Muscle weakness (generalized): Secondary | ICD-10-CM | POA: Diagnosis not present

## 2022-07-21 DIAGNOSIS — R2689 Other abnormalities of gait and mobility: Secondary | ICD-10-CM | POA: Diagnosis not present

## 2022-07-22 DIAGNOSIS — F0280 Dementia in other diseases classified elsewhere without behavioral disturbance: Secondary | ICD-10-CM | POA: Diagnosis not present

## 2022-07-22 DIAGNOSIS — R2689 Other abnormalities of gait and mobility: Secondary | ICD-10-CM | POA: Diagnosis not present

## 2022-07-22 DIAGNOSIS — R279 Unspecified lack of coordination: Secondary | ICD-10-CM | POA: Diagnosis not present

## 2022-07-22 DIAGNOSIS — M6281 Muscle weakness (generalized): Secondary | ICD-10-CM | POA: Diagnosis not present

## 2022-07-22 DIAGNOSIS — F0390 Unspecified dementia without behavioral disturbance: Secondary | ICD-10-CM | POA: Diagnosis not present

## 2022-07-22 DIAGNOSIS — Z9181 History of falling: Secondary | ICD-10-CM | POA: Diagnosis not present

## 2022-07-23 DIAGNOSIS — R279 Unspecified lack of coordination: Secondary | ICD-10-CM | POA: Diagnosis not present

## 2022-07-23 DIAGNOSIS — M6281 Muscle weakness (generalized): Secondary | ICD-10-CM | POA: Diagnosis not present

## 2022-07-23 DIAGNOSIS — F0280 Dementia in other diseases classified elsewhere without behavioral disturbance: Secondary | ICD-10-CM | POA: Diagnosis not present

## 2022-07-23 DIAGNOSIS — Z9181 History of falling: Secondary | ICD-10-CM | POA: Diagnosis not present

## 2022-07-23 DIAGNOSIS — R2689 Other abnormalities of gait and mobility: Secondary | ICD-10-CM | POA: Diagnosis not present

## 2022-07-23 DIAGNOSIS — F0390 Unspecified dementia without behavioral disturbance: Secondary | ICD-10-CM | POA: Diagnosis not present

## 2022-07-24 ENCOUNTER — Encounter: Payer: Medicare Other | Admitting: Internal Medicine

## 2022-07-24 DIAGNOSIS — L97822 Non-pressure chronic ulcer of other part of left lower leg with fat layer exposed: Secondary | ICD-10-CM | POA: Diagnosis not present

## 2022-07-24 DIAGNOSIS — I872 Venous insufficiency (chronic) (peripheral): Secondary | ICD-10-CM | POA: Diagnosis not present

## 2022-07-24 DIAGNOSIS — I480 Paroxysmal atrial fibrillation: Secondary | ICD-10-CM | POA: Diagnosis not present

## 2022-07-24 DIAGNOSIS — I10 Essential (primary) hypertension: Secondary | ICD-10-CM | POA: Diagnosis not present

## 2022-07-24 DIAGNOSIS — Z7901 Long term (current) use of anticoagulants: Secondary | ICD-10-CM | POA: Diagnosis not present

## 2022-07-29 NOTE — Progress Notes (Signed)
KAYLYN, GARROW ([id number]) [id number] Page 1 of 7 Visit Report for 07/24/2022 Debridement Details Patient Name: Date of Service: [name] Royals, DO RCA S 07/24/2022 11:15 A M Medical Record Number: [id number] Patient Account Number: [id number] Date of Birth/Sex: Treating RN: 06/29/1926 (89 y.o. [name] Primary Care Provider: Frazier Richards Other Clinician: Massie Kluver Referring Provider: Treating Provider/Extender: Eldridge Dace, [place] EL Donne Anon in Treatment: 1 Debridement Performed for Assessment: Wound #1 Left,Lateral Lower Leg Performed By: Physician Ricard Dillon, MD Debridement Type: Debridement Severity of Tissue Pre Debridement: Fat layer exposed Level of Consciousness (Pre-procedure): Awake and Alert Pre-procedure Verification/Time Out Yes - 11:29 Taken: Start Time: 11:29 T Area Debrided (L x W): otal 6 (cm) x 5 (cm) = 30 (cm) Tissue and other material debrided: Viable, Non-Viable, Slough, Slough Level: Non-Viable Tissue Debridement Description: Selective/Open Wound Instrument: Curette Bleeding: Minimum Hemostasis Achieved: Pressure Response to Treatment: Procedure was tolerated well Level of Consciousness (Post- Awake and Alert procedure): Post Debridement Measurements of Total Wound Length: (cm) 6 Width: (cm) 5 Depth: (cm) 0.1 Volume: (cm) 2.356 Character of Wound/Ulcer Post Debridement: Stable Severity of Tissue Post Debridement: Fat layer exposed Post Procedure Diagnosis Same as Pre-procedure Electronic Signature(s) Signed: 07/24/2022 12:28:22 PM By: Linton Ham MD Signed: 07/24/2022 1:03:02 PM By: Rosalio Loud MSN RN CNS WTA Signed: 07/29/2022 2:03:30 PM By: Massie Kluver Entered By: Massie Kluver on 07/24/2022 11:32:04 HPI Details -------------------------------------------------------------------------------- [name] ([id number]) [id number] Page 2 of  7 Patient Name: Date of Service: [name] Royals, DO RCA S 07/24/2022 11:15 A M Medical Record Number: [id number] Patient Account Number: [id number] Date of Birth/Sex: Treating RN: [birth year] (89 y.o. [name] Primary Care Provider: Frazier Richards Other Clinician: Massie Kluver Referring Provider: Treating Provider/Extender: Eldridge Dace, [place] EL Donne Anon in Treatment: 1 History of Present Illness HPI Description: 07-16-2022 upon evaluation today patient presents for initial inspection here in our clinic concerning a wound on the left lateral lower extremity. This is an area that has been present since around May 27, 2022 according to what the patient tells me today. With that being said she does live in assisted living facility. She is seen with her son in the office today. As far as past medical history is concerned she does have a history of breast cancer in 2003 which she did obviously survive. She also had a right knee replacement. In 2009 she had an IVC filter and heart ablation due to atrial fibrillation. With regard to the remainder of her medical history again she is on anticoagulant therapy at this point due to atrial fibrillation, she has hypertension, and chronic venous insufficiency based on what I am seeing currently. In regard to the wound she is currently on Keflex which should be completed on the 25th of this month. Other than that she has had various things applied to the wound including different creams but then at other times she also states that she has had nothing put on and been told to just leave it open to air. There is been a lot of confusion about the best treatment course with regard to her wound which is obviously not good. I discussed with her today that I think we can have the best plan going forward and that that will include keeping this covered but nonetheless it should help it to heal the most effectively as well. 12/29; left lower leg  chronic venous insufficiency. She complains fairly bitterly about the tightness of the wrap. She  was put on antibiotics last week which I believe is Keflex. She lives in an assisted living Electronic Signature(s) Signed: 07/24/2022 12:28:22 PM By: Linton Ham MD Entered By: Linton Ham on 07/24/2022 11:50:50 -------------------------------------------------------------------------------- Physical Exam Details Patient Name: Date of Service: [name], DO RCA S 07/24/2022 11:15 A M Medical Record Number: [id number] Patient Account Number: [id number] Date of Birth/Sex: Treating RN: 1925/09/07 (89 y.o. [name] Primary Care Provider: Frazier Richards Other Clinician: Massie Kluver Referring Provider: Treating Provider/Extender: RO BSO N, MICHA EL Donne Anon in Treatment: 1 Constitutional Sitting or standing Blood Pressure is within target range for patient.. Pulse regular and within target range for patient.Marland Kitchen Respirations regular, non-labored and within target range.. Temperature is normal and within the target range for the patient.Marland Kitchen appears in no distress. Notes Wound exam; somewhat irregular wound mild erythema around this but not palpably tender. Her edema control is very good. Surface slough and fibrinous debris I gently removed with a open curette. This is not something she tolerates well. Electronic Signature(s) Signed: 07/24/2022 12:28:22 PM By: Linton Ham MD Entered By: Linton Ham on 07/24/2022 11:52:04 [name] ([id number]) [id number] Page 3 of 7 -------------------------------------------------------------------------------- Physician Orders Details Patient Name: Date of Service: [name] Royals, DO RCA S 07/24/2022 11:15 A M Medical Record Number: [id number] Patient Account Number: [id number] Date of Birth/Sex: Treating RN: 03-17-1926 (89 y.o. [name] Primary Care Provider: Frazier Richards Other  Clinician: Massie Kluver Referring Provider: Treating Provider/Extender: RO BSO Delane Ginger, MICHA EL Donne Anon in Treatment: 1 Verbal / Phone Orders: No Diagnosis Coding Follow-up Appointments Wound #1 Left,Lateral Lower Leg Return Appointment in 1 week. Nurse Visit as needed Bathing/ Shower/ Hygiene May shower with wound dressing protected with water repellent cover or cast protector. Anesthetic (Use 'Patient Medications' Section for Anesthetic Order Entry) Lidocaine applied to wound bed Edema Control - Lymphedema / Segmental Compressive Device / Other Left Lower Extremity 3 Layer Compression System for Lymphedema. - left leg-change once weekly Elevate, Exercise Daily and A void Standing for Long Periods of Time. Elevate legs to the level of the heart and pump ankles as often as possible Elevate leg(s) parallel to the floor when sitting. Additional Orders / Instructions Follow Nutritious Diet and Increase Protein Intake Medications-Please add to medication list. ntibiotics - complete antibiotics P.O. A Other: - TCA to periwound Wound Treatment Wound #1 - Lower Leg Wound Laterality: Left, Lateral Cleanser: Soap and Water 1 x Per Week/30 Days Discharge Instructions: Gently cleanse wound with antibacterial soap, rinse and pat dry prior to dressing wounds Prim Dressing: Silvercel 4 1/4x 4 1/4 (in/in) 1 x Per Week/30 Days ary Discharge Instructions: Apply Silvercel 4 1/4x 4 1/4 (in/in) as instructed Secondary Dressing: Zetuvit Plus 4x4 (in/in) 1 x Per Week/30 Days Compression Wrap: 3-LAYER WRAP - Profore Lite LF 3 Multilayer Compression Bandaging System 1 x Per Week/30 Days Discharge Instructions: Apply 3 multi-layer wrap as prescribed. If wrap slides more than 1 inch (mark on leg) call wound care for re-wrap. Electronic Signature(s) Signed: 07/24/2022 12:28:22 PM By: Linton Ham MD Signed: 07/29/2022 2:03:30 PM By: Massie Kluver Entered By: Massie Kluver on  07/24/2022 [place], [place] ([id number]) [id number] Page 4 of 7 -------------------------------------------------------------------------------- Problem List Details Patient Name: Date of Service: [name] Royals, DO RCA S 07/24/2022 11:15 A M Medical Record Number: [id number] Patient Account Number: [id number] Date of Birth/Sex: Treating RN: 04/21/1926 (89 y.o. [name] Primary Care Provider: Frazier Richards Other Clinician: Massie Kluver Referring Provider: Treating Provider/Extender:  RO BSO N, MICHA EL Marlou Starks Weeks in Treatment: 1 Active Problems ICD-10 Encounter Code Description Active Date MDM Diagnosis I87.332 Chronic venous hypertension (idiopathic) with ulcer and inflammation of left 07/16/2022 No Yes lower extremity L97.822 Non-pressure chronic ulcer of other part of left lower leg with fat layer exposed12/21/2023 No Yes I10 Essential (primary) hypertension 07/16/2022 No Yes I48.0 Paroxysmal atrial fibrillation 07/16/2022 No Yes Z79.01 Long term (current) use of anticoagulants 07/16/2022 No Yes Inactive Problems Resolved Problems Electronic Signature(s) Signed: 07/24/2022 12:28:22 PM By: Linton Ham MD Entered By: Linton Ham on 07/24/2022 11:49:34 -------------------------------------------------------------------------------- Progress Note Details Patient Name: Date of Service: [name] Royals, DO RCA S 07/24/2022 11:15 A M Medical Record Number: [id number] Patient Account Number: [id number] Date of Birth/Sex: Treating RN: 01/02/1926 (89 y.o. [name] [place], [place] ([id number]) [id number] Page 5 of 7 Primary Care Provider: Frazier Richards Other Clinician: Massie Kluver Referring Provider: Treating Provider/Extender: Eldridge Dace, [place] EL Donne Anon in Treatment: 1 Subjective History of Present Illness (HPI) 07-16-2022 upon evaluation today patient presents for  initial inspection here in our clinic concerning a wound on the left lateral lower extremity. This is an area that has been present since around May 27, 2022 according to what the patient tells me today. With that being said she does live in assisted living facility. She is seen with her son in the office today. As far as past medical history is concerned she does have a history of breast cancer in 2003 which she did obviously survive. She also had a right knee replacement. In 2009 she had an IVC filter and heart ablation due to atrial fibrillation. With regard to the remainder of her medical history again she is on anticoagulant therapy at this point due to atrial fibrillation, she has hypertension, and chronic venous insufficiency based on what I am seeing currently. In regard to the wound she is currently on Keflex which should be completed on the 25th of this month. Other than that she has had various things applied to the wound including different creams but then at other times she also states that she has had nothing put on and been told to just leave it open to air. There is been a lot of confusion about the best treatment course with regard to her wound which is obviously not good. I discussed with her today that I think we can have the best plan going forward and that that will include keeping this covered but nonetheless it should help it to heal the most effectively as well. 12/29; left lower leg chronic venous insufficiency. She complains fairly bitterly about the tightness of the wrap. She was put on antibiotics last week which I believe is Keflex. She lives in an assisted living Objective Constitutional Sitting or standing Blood Pressure is within target range for patient.. Pulse regular and within target range for patient.Marland Kitchen Respirations regular, non-labored and within target range.. Temperature is normal and within the target range for the patient.Marland Kitchen appears in no distress. Vitals  Time Taken: 11:09 AM, Weight: 106 lbs, Temperature: 97.8 F, Pulse: 80 bpm, Respiratory Rate: 16 breaths/min, Blood Pressure: 122/71 mmHg. General Notes: Wound exam; somewhat irregular wound mild erythema around this but not palpably tender. Her edema control is very good. Surface slough and fibrinous debris I gently removed with a open curette. This is not something she tolerates well. Integumentary (Hair, Skin) Wound #1 status is Open. Original cause of wound was Gradually Appeared. The date acquired was:  05/27/2022. The wound has been in treatment 1 weeks. The wound is located on the Left,Lateral Lower Leg. The wound measures 6cm length x 5cm width x 0.1cm depth; 23.562cm^2 area and 2.356cm^3 volume. There is Fat Layer (Subcutaneous Tissue) exposed. There is no tunneling or undermining noted. There is a large amount of serous drainage noted. The wound margin is indistinct and nonvisible. There is no granulation within the wound bed. There is a large (67-100%) amount of necrotic tissue within the wound bed including Eschar and Adherent Slough. Assessment Active Problems ICD-10 Chronic venous hypertension (idiopathic) with ulcer and inflammation of left lower extremity Non-pressure chronic ulcer of other part of left lower leg with fat layer exposed Essential (primary) hypertension Paroxysmal atrial fibrillation Long term (current) use of anticoagulants Procedures Wound #1 Pre-procedure diagnosis of Wound #1 is a Venous Leg Ulcer located on the Left,Lateral Lower Leg .Severity of Tissue Pre Debridement is: Fat layer exposed. There was a Selective/Open Wound Non-Viable Tissue Debridement with a total area of 30 sq cm performed by Ricard Dillon, MD. With the following instrument(s): Curette to remove Viable and Non-Viable tissue/material. Material removed includes [place]. A time out was conducted at 11:29, prior to the start of the procedure. A Minimum amount of bleeding was controlled with  Pressure. The procedure was tolerated well. Post Debridement Measurements: 6cm length x 5cm width x 0.1cm depth; 2.356cm^3 volume. Character of Wound/Ulcer Post Debridement is stable. Severity of Tissue Post Debridement is: Fat layer exposed. Post procedure Diagnosis Wound #1: Same as Pre-Procedure Pre-procedure diagnosis of Wound #1 is a Venous Leg Ulcer located on the Left,Lateral Lower Leg . There was a Three Layer Compression Therapy Procedure with a pre-treatment ABI of 1 by Massie Kluver. Post procedure Diagnosis Wound #1: Same as Pre-Procedure COPPER, [place] ([id number]) [id number] Page 6 of 7 Plan Follow-up Appointments: Wound #1 Left,Lateral Lower Leg: Return Appointment in 1 week. Nurse Visit as needed Bathing/ Shower/ Hygiene: May shower with wound dressing protected with water repellent cover or cast protector. Anesthetic (Use 'Patient Medications' Section for Anesthetic Order Entry): Lidocaine applied to wound bed Edema Control - Lymphedema / Segmental Compressive Device / Other: 3 Layer Compression System for Lymphedema. - left leg-change once weekly Elevate, Exercise Daily and Avoid Standing for Long Periods of Time. Elevate legs to the level of the heart and pump ankles as often as possible Elevate leg(s) parallel to the floor when sitting. Additional Orders / Instructions: Follow Nutritious Diet and Increase Protein Intake Medications-Please add to medication list.: P.O. Antibiotics - complete antibiotics Other: - TCA to periwound WOUND #1: - Lower Leg Wound Laterality: Left, Lateral Cleanser: Soap and Water 1 x Per Week/30 Days Discharge Instructions: Gently cleanse wound with antibacterial soap, rinse and pat dry prior to dressing wounds Prim Dressing: Silvercel 4 1/4x 4 1/4 (in/in) 1 x Per Week/30 Days ary Discharge Instructions: Apply Silvercel 4 1/4x 4 1/4 (in/in) as instructed Secondary Dressing: Zetuvit Plus 4x4 (in/in) 1 x Per  Week/30 Days Com pression Wrap: 3-LAYER WRAP - Profore Lite LF 3 Multilayer Compression Bandaging System 1 x Per Week/30 Days Discharge Instructions: Apply 3 multi-layer wrap as prescribed. If wrap slides more than 1 inch (mark on leg) call wound care for re-wrap. 1. #1 I continued with silver alginate as a primary dressing TCA to the periwound 2. Any degree of wound or superficial infection in the periwound seems a lot better on the antibiotics I did not add to this 3. Because the patient complains of bitterly  about the degree of compression I decreased her to 3 layer. 4. She has had an assisted living but she is leaving this on all week we will see her in 1 week Electronic Signature(s) Signed: 07/24/2022 12:28:22 PM By: Linton Ham MD Entered By: Linton Ham on 07/24/2022 11:56:30 -------------------------------------------------------------------------------- SuperBill Details Patient Name: Date of Service: [name] Royals, DO RCA S 07/24/2022 Medical Record Number: [id number] Patient Account Number: [id number] Date of Birth/Sex: Treating RN: Dec 10, 1925 (89 y.o. [name] Primary Care Provider: Frazier Richards Other Clinician: Massie Kluver Referring Provider: Treating Provider/Extender: Eldridge Dace, MICHA EL Donne Anon in Treatment: 1 Diagnosis Coding ICD-10 Codes Code Description [phone] Chronic venous hypertension (idiopathic) with ulcer and inflammation of left lower extremity L97.822 Non-pressure chronic ulcer of other part of left lower leg with fat layer exposed I10 Essential (primary) hypertension I48.0 Paroxysmal atrial fibrillation Z79.01 Long term (current) use of anticoagulants Facility Procedures LYNORE, COSCIA ([id number]): CPT4 Code Description [id number] - DEBRIDE WOUND 1ST 20 SQ CM OR < ICD-10 Diagnosis Description L97.822 Non-pressure chronic ulcer of other part of left lower leg with fat [id number] Page 7 of  7: Modifier Quantity 1 layer exposed JESI, JURGENS ([id number]): [id number] 97598 - DEBRIDE WOUND EA ADDL 20 SQ CM ICD-10 Diagnosis Description L97.822 Non-pressure chronic ulcer of other part of left lower leg with fat [id number] Page 7 of 7: 1 layer exposed Physician Procedures : CPT4 Code Description Modifier [id number] 64332 - WC PHYS DEBR WO ANESTH 20 SQ CM ICD-10 Diagnosis Description L97.822 Non-pressure chronic ulcer of other part of left lower leg with fat layer exposed Quantity: 1 : [id number] 66063 - WC PHYS DEBR WO ANESTH EA ADD 20 CM ICD-10 Diagnosis Description L97.822 Non-pressure chronic ulcer of other part of left lower leg with fat layer exposed Quantity: 1 Electronic Signature(s) Signed: 07/24/2022 12:28:22 PM By: Linton Ham MD Entered By: Linton Ham on 07/24/2022 12:05:57

## 2022-07-29 NOTE — Progress Notes (Signed)
SHERRICE, CREEKMORE ([id number]) [id number] Page 1 of 10 Visit Report for 07/24/2022 Arrival Information Details Patient Name: Date of Service: [name] Royals, DO RCA S 07/24/2022 11:15 A M Medical Record Number: [id number] Patient Account Number: [id number] Date of Birth/Sex: Treating RN: 1926-02-29 (89 y.o. [name] Primary Care Trevion Hoben: Frazier Richards Other Clinician: Massie [name] Referring Tryniti Laatsch: Treating Keshona Kartes/Extender: Eldridge Dace, MICHA EL Donne Anon in Treatment: 1 Visit Information History Since Last Visit All ordered tests and consults were completed: No Patient Arrived: Wheel Chair Added or deleted any medications: No Arrival Time: 11:06 Any new allergies or adverse reactions: No Transfer Assistance: EasyPivot Patient Lift Had a fall or experienced change in No Patient Identification Verified: Yes activities of daily living that may affect Patient Has Alerts: Yes risk of falls: Patient Alerts: Patient on Blood Thinner Signs or symptoms of abuse/neglect since last visito No NOT DIABETIC Hospitalized since last visit: No Eliquis Implantable device outside of the clinic excluding No cellular tissue based products placed in the center since last visit: Has Dressing in Place as Prescribed: Yes Has Compression in Place as Prescribed: Yes Pain Present Now: No Electronic Signature(s) Signed: 07/29/2022 2:03:30 PM By: [name] Entered By: [name] on 07/24/2022 11:09:43 -------------------------------------------------------------------------------- Clinic Level of Care Assessment Details Patient Name: Date of Service: [name] MSEY, DO RCA S 07/24/2022 11:15 A M Medical Record Number: [id number] Patient Account Number: [id number] Date of Birth/Sex: Treating RN: 09/18/1925 (89 y.o. [name] Primary Care [name]: Frazier Richards Other Clinician: Massie [name] Referring [name]: Treating [name]/Extender:  [name], MICHA EL Donne Anon in Treatment: 1 Clinic Level of Care Assessment Items TOOL 1 Quantity Score _0  - 0 Use when EandM and Procedure is performed on INITIAL visit ASSESSMENTS - Nursing Assessment / Reassessment _1  - 0 General Physical Exam (combine w/ comprehensive assessment (listed just below) when performed on new pt. [name] CircleMCKENSEY, [name] ([id number]) [id number] Page 2 of 10 _2  - 0 Comprehensive Assessment (HX, ROS, Risk Assessments, Wounds Hx, etc.) ASSESSMENTS - Wound and Skin Assessment / Reassessment _3  - 0 Dermatologic / Skin Assessment (not related to wound area) ASSESSMENTS - Ostomy and/or Continence Assessment and Care _4  - 0 Incontinence Assessment and Management _5  - 0 Ostomy Care Assessment and Management (repouching, etc.) PROCESS - Coordination of Care _6  - 0 Simple Patient / Family Education for ongoing care _7  - 0 Complex (extensive) Patient / Family Education for ongoing care _8  - 0 Staff obtains Programmer, systems, Records, T Results / Process Orders est _9  - 0 Staff telephones HHA, Nursing Homes / Clarify orders / etc _10  - 0 Routine Transfer to another Facility (non-emergent condition) _11  - 0 Routine Hospital Admission (non-emergent condition) _12  - 0 New Admissions / Biomedical engineer / Ordering NPWT Apligraf, etc. , _13  - 0 Emergency Hospital Admission (emergent condition) PROCESS - Special Needs _14  - 0 Pediatric / Minor Patient Management _15  - 0 Isolation Patient Management _16  - 0 Hearing / Language / Visual special needs _17  - 0 Assessment of Community assistance (transportation, D/C planning, etc.) _18  - 0 Additional assistance / Altered mentation _19  - 0 Support Surface(s) Assessment (bed, cushion, seat, etc.) INTERVENTIONS - Miscellaneous _20  - 0 External ear exam _21  - 0 Patient Transfer (multiple staff / Civil Service fast streamer / Similar devices) _22  - 0 Simple Staple / Suture removal (25 or less) _23   - 0 Complex Staple / Suture removal (26 or more) _24  - 0 Hypo/Hyperglycemic Management (do not check if billed separately) _25  -  0 Ankle / Brachial Index (ABI) - do not check if billed separately Has the patient been seen at the hospital within the last three years: Yes Total Score: 0 Level Of Care: ____ Electronic Signature(s) Signed: 07/29/2022 2:03:30 PM By: [name] Entered By: [name] on 07/24/2022 11:34:21 -------------------------------------------------------------------------------- Compression Therapy Details Patient Name: Date of Service: [name] Bufford Spikes, DO RCA S 07/24/2022 11:15 A M Medical Record Number: [id number] Patient Account Number: [id number] Date of Birth/Sex: Treating RN: [birth year] (89 y.o. [name] Primary Care Biviana Saddler: Frazier Richards Other Clinician: Massie [name] [place], [name] ([id number]) [id number] Page 3 of 10 Referring [name]: Treating Milana Salay/Extender: [name] BSO N, MICHA EL Marlou Starks Weeks in Treatment: 1 Compression Therapy Performed for Wound Assessment: Wound #1 Left,Lateral Lower Leg Performed By: [name], [name], Compression Type: Three Layer Pre Treatment ABI: 1 Post Procedure Diagnosis Same as Pre-procedure Electronic Signature(s) Signed: 07/29/2022 2:03:30 PM By: [name] Entered By: [name] on 07/24/2022 11:32:19 -------------------------------------------------------------------------------- Encounter Discharge Information Details Patient Name: Date of Service: [name] Bufford Spikes, DO RCA S 07/24/2022 11:15 A M Medical Record Number: [id number] Patient Account Number: [id number] Date of Birth/Sex: Treating RN: 09-03-1925 (89 y.o. [name] Primary Care Maveryck Bahri: Frazier Richards Other Clinician: Massie [name] Referring Letrell Attwood: Treating Suzanne Garbers/Extender: [name] BSO N, MICHA EL Donne Anon in Treatment: 1 Encounter Discharge Information Items Post  Procedure Vitals Discharge Condition: Stable Temperature (F): 97.8 Ambulatory Status: Wheelchair Pulse (bpm): 80 Discharge Destination: Home Respiratory Rate (breaths/min): 16 Transportation: Private Auto Blood Pressure (mmHg): 122/71 Accompanied By: son Schedule Follow-up Appointment: Yes Clinical Summary of Care: Electronic Signature(s) Signed: 07/29/2022 2:03:30 PM By: [name] Entered By: [name] on 07/24/2022 11:56:53 -------------------------------------------------------------------------------- Lower Extremity Assessment Details Patient Name: Date of Service: [name] MSEY, DO RCA S 07/24/2022 11:15 A M Medical Record Number: [id number] Patient Account Number: [id number] Date of Birth/Sex: Treating RN: 08-07-1925 (89 y.o. [name] Primary Care Kimmy Parish: Frazier Richards Other Clinician: Massie [name] Referring Tashara Suder: Treating Avey Mcmanamon/Extender: Eldridge Dace, McQueeney EL Donne Anon in Treatment: 1 McClelland, [place]xton ([id number]) [id number] Page 4 of 10 Edema Assessment Assessed: [Left: Yes] [Right: No] Edema: [Left: Ye] [Right: s] Calf Left: Right: Point of Measurement: 30 cm From Medial Instep 29.6 cm Ankle Left: Right: Point of Measurement: 12 cm From Medial Instep 19.7 cm Vascular Assessment Pulses: Dorsalis Pedis Palpable: [Left:Yes] Electronic Signature(s) Signed: 07/24/2022 1:03:02 PM By: Rosalio Loud MSN RN CNS WTA Signed: 07/29/2022 2:03:30 PM By: [name] Entered By: [name] on 07/24/2022 11:24:01 -------------------------------------------------------------------------------- Multi Wound Chart Details Patient Name: Date of Service: [name] Bufford Spikes, DO RCA S 07/24/2022 11:15 A M Medical Record Number: [id number] Patient Account Number: [id number] Date of Birth/Sex: Treating RN: 1926-05-29 (89 y.o. [name] Primary Care Timofey Carandang: Frazier Richards Other Clinician: Massie [name] Referring  Xavius Spadafore: Treating Cheryll Keisler/Extender: [name] BSO N, MICHA EL Donne Anon in Treatment: 1 Vital Signs Height(in): Pulse(bpm): 80 Weight(lbs): 106 Blood Pressure(mmHg): 122/71 Body Mass Index(BMI): Temperature(F): 97.8 Respiratory Rate(breaths/min): 16 [1:Photos:] [N/A:N/A] Left, Lateral Lower Leg N/A N/A Wound Location: Gradually Appeared N/A N/A Wounding Event: Venous Leg Ulcer N/A N/A Primary Etiology: Hypertension, Dementia, Received N/A N/A Comorbid History: Chemotherapy 05/27/2022 N/A N/A Date Acquired: 1 N/A N/A Weeks of Treatment: Open N/A N/A Wound StatusFALON, HUESCA ([id number]) [id number] Page 5 of 10 No N/A N/A Wound Recurrence: 6x5x0.1 N/A N/A Measurements L x W x D (cm) 23.562 N/A N/A A (cm) : rea 2.356 N/A N/A Volume (cm) : 10.40% N/A  N/A % Reduction in Area: 55.20% N/A N/A % Reduction in Volume: Full Thickness Without Exposed N/A N/A Classification: Support Structures Large N/A N/A Exudate A mount: Serous N/A N/A Exudate Type: amber N/A N/A Exudate Color: Indistinct, nonvisible N/A N/A Wound Margin: None Present (0%) N/A N/A Granulation Amount: Large (67-100%) N/A N/A Necrotic Amount: Eschar, Adherent Slough N/A N/A Necrotic Tissue: Fat Layer (Subcutaneous Tissue): Yes N/A N/A Exposed Structures: Fascia: No Tendon: No Muscle: No Joint: No Bone: No None N/A N/A Epithelialization: Treatment Notes Electronic Signature(s) Signed: 07/29/2022 2:03:30 PM By: [name] Entered By: [name] on 07/24/2022 11:24:05 -------------------------------------------------------------------------------- Multi-Disciplinary Care Plan Details Patient Name: Date of Service: [name] Royals, DO RCA S 07/24/2022 11:15 A M Medical Record Number: [id number] Patient Account Number: [id number] Date of Birth/Sex: Treating RN: Feb 04, 1926 (89 y.o. [name] Primary Care Rayen Palen: Frazier Richards Other  Clinician: Massie [name] Referring Kyannah Climer: Treating Vercie Pokorny/Extender: Eldridge Dace, MICHA EL Donne Anon in Treatment: 1 Active Inactive Necrotic Tissue Nursing Diagnoses: Impaired tissue integrity related to necrotic/devitalized tissue Knowledge deficit related to management of necrotic/devitalized tissue Goals: Necrotic/devitalized tissue will be minimized in the wound bed Date Initiated: 07/16/2022 Target Resolution Date: 07/23/2022 Goal Status: Active Patient/caregiver will verbalize understanding of reason and process for debridement of necrotic tissue Date Initiated: 07/16/2022 Target Resolution Date: 07/23/2022 Goal Status: Active Interventions: Assess patient pain level pre-, during and post procedure and prior to discharge Provide education on necrotic tissue and debridement process Treatment Activities: Apply topical anesthetic as ordered : 07/16/2022 Excisional debridement : 07/16/2022 TONIANN, DICKERSON ([id number]) [phone].pdf Page 6 of 10 Notes: Orientation to the Wound Care Program Nursing Diagnoses: Knowledge deficit related to the wound healing center program Goals: Patient/caregiver will verbalize understanding of the [place] Date Initiated: 07/16/2022 Target Resolution Date: 07/16/2022 Goal Status: Active Interventions: Provide education on orientation to the wound center Notes: Pain, Acute or Chronic Nursing Diagnoses: Pain Management - Non-cyclic Acute (Procedural) Potential alteration in comfort, pain Goals: Patient will verbalize adequate pain control and receive pain control interventions during procedures as needed Date Initiated: 07/16/2022 Target Resolution Date: 07/23/2022 Goal Status: Active Patient/caregiver will verbalize adequate pain control between visits Date Initiated: 07/16/2022 Target Resolution Date: 07/23/2022 Goal Status: Active Patient/caregiver will verbalize comfort  level met Date Initiated: 07/16/2022 Target Resolution Date: 07/16/2022 Goal Status: Active Interventions: Reposition patient for comfort Treatment Activities: Administer pain control measures as ordered : 07/16/2022 Notes: Venous Leg Ulcer Nursing Diagnoses: Knowledge deficit related to disease process and management Potential for venous Insuffiency (use before diagnosis confirmed) Goals: Patient will maintain optimal edema control Date Initiated: 07/16/2022 Target Resolution Date: 07/23/2022 Goal Status: Active Patient/caregiver will verbalize understanding of disease process and disease management Date Initiated: 07/16/2022 Target Resolution Date: 07/23/2022 Goal Status: Active Interventions: Assess peripheral edema status every visit. Compression as ordered Notes: Wound/Skin Impairment Nursing Diagnoses: Impaired tissue integrity Knowledge deficit related to smoking impact on wound healing Knowledge deficit related to ulceration/compromised skin integrity Goals: Patient/caregiver will verbalize understanding of skin care regimen Date Initiated: 07/16/2022 Target Resolution Date: 07/16/2022 Goal Status: Active Ulcer/skin breakdown will have a volume reduction of 30% by week 4 Date Initiated: 07/16/2022 Target Resolution Date: 08/13/2022 Goal Status: LOISE, ESGUERRA ([id number]) [id number] Page 7 of 10 Interventions: Assess patient/caregiver ability to obtain necessary supplies Assess patient/caregiver ability to perform ulcer/skin care regimen upon admission and as needed Assess ulceration(s) every visit Provide education on ulcer and skin care Treatment Activities: Skin care regimen initiated : 07/16/2022 Topical  wound management initiated : 07/16/2022 Notes: Electronic Signature(s) Signed: 07/24/2022 1:03:02 PM By: Rosalio Loud MSN RN CNS WTA Signed: 07/29/2022 2:03:30 PM By: [name] Entered By: [name] on 07/24/2022  11:56:06 -------------------------------------------------------------------------------- Pain Assessment Details Patient Name: Date of Service: [name] Bufford Spikes, DO RCA S 07/24/2022 11:15 A M Medical Record Number: [id number] Patient Account Number: [id number] Date of Birth/Sex: Treating RN: Mar 12, 1926 (89 y.o. [name] Primary Care Ereka Brau: Frazier Richards Other Clinician: Massie [name] Referring Nakeia Calvi: Treating Danylah Holden/Extender: [name], MICHA EL Donne Anon in Treatment: 1 Active Problems Location of Pain Severity and Description of Pain Patient Has Paino No Site Locations Pain Management and Medication Current Pain Management: Electronic Signature(s) Signed: 07/24/2022 1:03:02 PM By: Rosalio Loud MSN RN CNS WTA Signed: 07/29/2022 2:03:30 PM By: [name] Entered By: [name] on 07/24/2022 11:11:46 [name] ([id number]) [id number] Page 8 of 10 -------------------------------------------------------------------------------- Patient/Caregiver Education Details Patient Name: Date of Service: [name] Royals, DO RCA S 12/29/2023andnbsp11:15 A M Medical Record Number: [id number] Patient Account Number: [id number] Date of Birth/Gender: Treating RN: [birth year] (89 y.o. [name] Primary Care Physician: Frazier Richards Other Clinician: Massie [name] Referring Physician: Treating Physician/Extender: [name] BSO N, Hatch EL Donne Anon in Treatment: 1 Education Assessment Education Provided To: Patient Education Topics Provided Wound/Skin Impairment: Handouts: Other: continue wound care as directed Methods: Explain/Verbal Responses: State content correctly Electronic Signature(s) Signed: 07/29/2022 2:03:30 PM By: [name] Entered By: [name] on 07/24/2022 11:34:50 -------------------------------------------------------------------------------- Wound Assessment Details Patient Name: Date of  Service: [name] MSEY, DO RCA S 07/24/2022 11:15 A M Medical Record Number: [id number] Patient Account Number: [id number] Date of Birth/Sex: Treating RN: 03-04-1926 (89 y.o. [name] Primary Care Aino Heckert: Frazier Richards Other Clinician: Massie [name] Referring Krisna Omar: Treating Maryna Yeagle/Extender: Eldridge Dace, MICHA EL Marlou Starks Weeks in Treatment: 1 Wound Status Wound Number: 1 Primary Etiology: Venous Leg Ulcer Wound Location: Left, Lateral Lower Leg Wound Status: Open Wounding Event: Gradually Appeared Comorbid History: Hypertension, Dementia, Received Chemotherapy Date Acquired: 05/27/2022 Weeks Of Treatment: 1 Clustered Wound: No Photos KELLIE, MURRILL ([id number]) [id number] Page 9 of 10 Wound Measurements Length: (cm) 6 Width: (cm) 5 Depth: (cm) 0.1 Area: (cm) 23.562 Volume: (cm) 2.356 % Reduction in Area: 10.4% % Reduction in Volume: 55.2% Epithelialization: None Tunneling: No Undermining: No Wound Description Classification: Full Thickness Without Exposed Support Structures Wound Margin: Indistinct, nonvisible Exudate Amount: Large Exudate Type: Serous Exudate Color: amber Foul Odor After Cleansing: No Slough/Fibrino Yes Wound Bed Granulation Amount: None Present (0%) Exposed Structure Necrotic Amount: Large (67-100%) Fascia Exposed: No Necrotic Quality: Eschar, Adherent Slough Fat Layer (Subcutaneous Tissue) Exposed: Yes Tendon Exposed: No Muscle Exposed: No Joint Exposed: No Bone Exposed: No Treatment Notes Wound #1 (Lower Leg) Wound Laterality: Left, Lateral Cleanser Soap and Water Discharge Instruction: Gently cleanse wound with antibacterial soap, rinse and pat dry prior to dressing wounds Peri-Wound Care Topical Primary Dressing Silvercel 4 1/4x 4 1/4 (in/in) Discharge Instruction: Apply Silvercel 4 1/4x 4 1/4 (in/in) as instructed Secondary Dressing Zetuvit Plus 4x4 (in/in) Secured  With Compression Wrap 3-LAYER WRAP - Profore Lite LF 3 Multilayer Compression Bandaging System Discharge Instruction: Apply 3 multi-layer wrap as prescribed. If wrap slides more than 1 inch (mark on leg) call wound care for re-wrap. Compression Stockings Add-Ons Electronic Signature(s) Signed: 07/24/2022 1:03:02 PM By: Rosalio Loud MSN RN CNS WTA Signed: 07/29/2022 2:03:30 PM By: [name] Entered By: [name] on 07/24/2022 11:23:03 [name] ([id number]) [id number] Page 10  of 10 -------------------------------------------------------------------------------- Vitals Details Patient Name: Date of Service: [name] Royals, DO RCA S 07/24/2022 11:15 A M Medical Record Number: [id number] Patient Account Number: [id number] Date of Birth/Sex: Treating RN: Jun 02, 1926 (89 y.o. [name] Primary Care Cerina Leary: Frazier Richards Other Clinician: Massie [name] Referring Lenoria Narine: Treating Haile Toppins/Extender: [name] BSO N, MICHA EL Donne Anon in Treatment: 1 Vital Signs Time Taken: 11:09 Temperature (F): 97.8 Weight (lbs): 106 Pulse (bpm): 80 Respiratory Rate (breaths/min): 16 Blood Pressure (mmHg): 122/71 Reference Range: 80 - 120 mg / dl Electronic Signature(s) Signed: 07/29/2022 2:03:30 PM By: [name] Entered By: [name] on 07/24/2022 11:11:41

## 2022-07-30 ENCOUNTER — Encounter: Payer: Medicare Other | Attending: Physician Assistant | Admitting: Physician Assistant

## 2022-07-30 DIAGNOSIS — Z853 Personal history of malignant neoplasm of breast: Secondary | ICD-10-CM | POA: Diagnosis not present

## 2022-07-30 DIAGNOSIS — I10 Essential (primary) hypertension: Secondary | ICD-10-CM | POA: Diagnosis not present

## 2022-07-30 DIAGNOSIS — Z96651 Presence of right artificial knee joint: Secondary | ICD-10-CM | POA: Diagnosis not present

## 2022-07-30 DIAGNOSIS — L97822 Non-pressure chronic ulcer of other part of left lower leg with fat layer exposed: Secondary | ICD-10-CM | POA: Diagnosis not present

## 2022-07-30 DIAGNOSIS — I87332 Chronic venous hypertension (idiopathic) with ulcer and inflammation of left lower extremity: Secondary | ICD-10-CM | POA: Insufficient documentation

## 2022-07-30 DIAGNOSIS — Z7901 Long term (current) use of anticoagulants: Secondary | ICD-10-CM | POA: Insufficient documentation

## 2022-07-30 DIAGNOSIS — I480 Paroxysmal atrial fibrillation: Secondary | ICD-10-CM | POA: Diagnosis not present

## 2022-07-30 NOTE — Progress Notes (Addendum)
JOSSELINE, REDDIN ([id number]) [id number] Page 1 of 7 Visit Report for 07/30/2022 Chief Complaint Document Details Patient Name: Date of Service: [name] Royals, DO RCA S 07/30/2022 2:00 PM Medical Record Number: [id number] Patient Account Number: [id number] Date of Birth/Sex: Treating RN: 1925-08-31 (89 y.o. [name] Primary Care Provider: Frazier Richards Other Clinician: Massie Kluver Referring Provider: Treating Provider/Extender: [name] in Treatment: 2 Information Obtained from: Patient Chief Complaint Left lateral LE ulcer Electronic Signature(s) Signed: 07/30/2022 2:07:47 PM By: Worthy Keeler PA-C Entered By: Worthy Keeler on 07/30/2022 14:07:46 -------------------------------------------------------------------------------- HPI Details Patient Name: Date of Service: [name], DO RCA S 07/30/2022 2:00 PM Medical Record Number: [id number] Patient Account Number: [id number] Date of Birth/Sex: Treating RN: 1925-11-14 (89 y.o. [name] Primary Care Provider: Frazier Richards Other Clinician: Massie Kluver Referring Provider: Treating Provider/Extender: [name] in Treatment: 2 History of Present Illness HPI Description: 07-16-2022 upon evaluation today patient presents for initial inspection here in our clinic concerning a wound on the left lateral lower extremity. This is an area that has been present since around May 27, 2022 according to what the patient tells me today. With that being said she does live in assisted living facility. She is seen with her son in the office today. As far as past medical history is concerned she does have a history of breast cancer in 2003 which she did obviously survive. She also had a right knee replacement. In 2009 she had an IVC filter and heart ablation due to atrial fibrillation. With regard to the remainder of her medical history again she is on  anticoagulant therapy at this point due to atrial fibrillation, she has hypertension, and chronic venous insufficiency based on what I am seeing currently. In regard to the wound she is currently on Keflex which should be completed on the 25th of this month. Other than that she has had various things applied to the wound including different creams but then at other times she also states that she has had nothing put on and been told to just leave it open to air. There is been a lot of confusion about the best treatment course with regard to her wound which is obviously not good. I discussed with her today that I think we can have the best plan going forward and that that will include keeping this covered but nonetheless it should help it to heal the most effectively as well. 12/29; left lower leg chronic venous insufficiency. She complains fairly bitterly about the tightness of the wrap. She was put on antibiotics last week which I believe is Keflex. She lives in an assisted living 07-31-2021 upon evaluation today patient appears to be doing well currently in regard to her wound. She is actually been tolerating the dressing changes this is measuring smaller and looking better. I think we are on the right track I do believe that the current compression wrap is helping significantly with her [place] [place] ([id number]) [id number] Page 2 of 7 this point. Overall I do not see any signs of infection locally nor systemically which is great news. No fevers, chills, nausea, vomiting, or diarrhea. Electronic Signature(s) Signed: 07/31/2022 7:40:53 AM By: Worthy Keeler PA-C Entered By: Worthy Keeler on 07/31/2022 07:40:53 -------------------------------------------------------------------------------- Physical Exam Details Patient Name: Date of Service: [name], DO RCA S 07/30/2022 2:00 PM Medical Record Number: [id number] Patient Account Number: [id number] Date of  Birth/Sex: Treating RN: 1925-12-02 (89 y.o. F)  Rosalio Loud Primary Care Provider: Frazier Richards Other Clinician: Massie Kluver Referring Provider: Treating Provider/Extender: [name] in Treatment: 2 Constitutional Well-nourished and well-hydrated in no acute distress. Respiratory normal breathing without difficulty. Psychiatric this patient is able to make decisions and demonstrates good insight into disease process. Alert and Oriented x 3. pleasant and cooperative. Notes Upon inspection patient's wound bed actually showed signs of good granulation and epithelization at this point. Overall I am extremely happy with where we stand and I do believe that we are headed in the right direction here. No fevers, chills, nausea, vomiting, or diarrhea. Electronic Signature(s) Signed: 07/31/2022 7:46:46 AM By: Worthy Keeler PA-C Entered By: Worthy Keeler on 07/31/2022 07:46:46 -------------------------------------------------------------------------------- Physician Orders Details Patient Name: Date of Service: [name], DO RCA S 07/30/2022 2:00 PM Medical Record Number: [id number] Patient Account Number: [id number] Date of Birth/Sex: Treating RN: [birth year] (89 y.o. [name] Primary Care Provider: Frazier Richards Other Clinician: Massie Kluver Referring Provider: Treating Provider/Extender: [name] in Treatment: 2 Verbal / Phone Orders: No Diagnosis Coding ICD-10 Coding Code Description JUSTYN, BOYSON ([id number]) [id number] Page 3 of 7 [phone] Chronic venous hypertension (idiopathic) with ulcer and inflammation of left lower extremity L97.822 Non-pressure chronic ulcer of other part of left lower leg with fat layer exposed I10 Essential (primary) hypertension I48.0 Paroxysmal atrial fibrillation Z79.01 Long term (current) use of anticoagulants Follow-up Appointments Wound #1  Left,Lateral Lower Leg Return Appointment in 1 week. Nurse Visit as needed Bathing/ Shower/ Hygiene May shower with wound dressing protected with water repellent cover or cast protector. Anesthetic (Use 'Patient Medications' Section for Anesthetic Order Entry) Lidocaine applied to wound bed Edema Control - Lymphedema / Segmental Compressive Device / Other Left Lower Extremity 3 Layer Compression System for Lymphedema. - left leg-change once weekly Elevate, Exercise Daily and A void Standing for Long Periods of Time. Elevate legs to the level of the heart and pump ankles as often as possible Elevate leg(s) parallel to the floor when sitting. Additional Orders / Instructions Follow Nutritious Diet and Increase Protein Intake Medications-Please add to medication list. ntibiotics - complete antibiotics P.O. A Other: - TCA to periwound Wound Treatment Wound #1 - Lower Leg Wound Laterality: Left, Lateral Cleanser: Soap and Water 1 x Per Week/30 Days Discharge Instructions: Gently cleanse wound with antibacterial soap, rinse and pat dry prior to dressing wounds Prim Dressing: Silvercel 4 1/4x 4 1/4 (in/in) 1 x Per Week/30 Days ary Discharge Instructions: Apply Silvercel 4 1/4x 4 1/4 (in/in) as instructed Secondary Dressing: Zetuvit Plus 4x4 (in/in) 1 x Per Week/30 Days Compression Wrap: 3-LAYER WRAP - Profore Lite LF 3 Multilayer Compression Bandaging System 1 x Per Week/30 Days Discharge Instructions: Apply 3 multi-layer wrap as prescribed. If wrap slides more than 1 inch (mark on leg) call wound care for re-wrap. Electronic Signature(s) Signed: 07/30/2022 5:24:26 PM By: Massie Kluver Signed: 07/31/2022 1:38:11 PM By: Worthy Keeler PA-C Entered By: Massie Kluver on 07/30/2022 15:04:43 -------------------------------------------------------------------------------- Problem List Details Patient Name: Date of Service: [name], DO RCA S 07/30/2022 2:00 PM Medical Record Number:  [id number] Patient Account Number: [id number] Date of Birth/Sex: Treating RN: January 10, 1926 (89 y.o. [name] Primary Care Provider: Frazier Richards Other Clinician: Massie Kluver Referring Provider: Treating Provider/Extender: [name] in Treatment: [address] [place], [name] ([id number]) [id number] Page 4 of 7 ICD-10 Encounter Code Description Active Date MDM Diagnosis I87.332 Chronic venous hypertension (idiopathic) with ulcer and inflammation of  left 07/16/2022 No Yes lower extremity L97.822 Non-pressure chronic ulcer of other part of left lower leg with fat layer exposed12/21/2023 No Yes I10 Essential (primary) hypertension 07/16/2022 No Yes I48.0 Paroxysmal atrial fibrillation 07/16/2022 No Yes Z79.01 Long term (current) use of anticoagulants 07/16/2022 No Yes Inactive Problems Resolved Problems Electronic Signature(s) Signed: 07/30/2022 2:07:44 PM By: Worthy Keeler PA-C Entered By: Worthy Keeler on 07/30/2022 14:07:44 -------------------------------------------------------------------------------- Progress Note Details Patient Name: Date of Service: [name], DO RCA S 07/30/2022 2:00 PM Medical Record Number: [id number] Patient Account Number: [id number] Date of Birth/Sex: Treating RN: 08-18-1925 (89 y.o. [name] Primary Care Provider: Frazier Richards Other Clinician: Massie Kluver Referring Provider: Treating Provider/Extender: [name] in Treatment: 2 Subjective Chief Complaint Information obtained from Patient Left lateral LE ulcer History of Present Illness (HPI) 07-16-2022 upon evaluation today patient presents for initial inspection here in our clinic concerning a wound on the left lateral lower extremity. This is an area that has been present since around May 27, 2022 according to what the patient tells me today. With that being said she does live in  assisted living facility. She is seen with her son in the office today. As far as past medical history is concerned she does have a history of breast cancer in 2003 which she did obviously survive. She also had a right knee replacement. In 2009 she had an IVC filter and heart ablation due to atrial fibrillation. With regard to the remainder of her medical history again she is on anticoagulant therapy at this point due to atrial fibrillation, she has hypertension, and chronic venous insufficiency based on what I am seeing currently. In regard to the wound she is currently on Keflex which should be completed on the 25th of this month. Other than that she has had various things applied to the wound including different creams but then at other times she also states that she has had nothing put on and been told to just leave it open to air. There is been a lot of confusion about the best treatment course with regard to her wound which is obviously not good. I discussed with her today that I think we can have the best plan going forward and that that will include keeping this covered but nonetheless it should help it to heal the most effectively as well. 12/29; left lower leg chronic venous insufficiency. She complains fairly bitterly about the tightness of the wrap. She was put on antibiotics last week which I [place], [name] ([id number]) [id number] Page 5 of 7 believe is Keflex. She lives in an assisted living 07-31-2021 upon evaluation today patient appears to be doing well currently in regard to her wound. She is actually been tolerating the dressing changes this is measuring smaller and looking better. I think we are on the right track I do believe that the current compression wrap is helping significantly with her [place] at this point. Overall I do not see any signs of infection locally nor systemically which is great news. No fevers, chills, nausea, vomiting, or  diarrhea. Objective Constitutional Well-nourished and well-hydrated in no acute distress. Vitals Time Taken: 2:21 PM, Weight: 106 lbs, Temperature: 97.7 F, Pulse: 74 bpm, Respiratory Rate: 16 breaths/min, Blood Pressure: 145/79 mmHg. Respiratory normal breathing without difficulty. Psychiatric this patient is able to make decisions and demonstrates good insight into disease process. Alert and Oriented x 3. pleasant and cooperative. General Notes: Upon inspection patient's wound bed actually showed signs of  good granulation and epithelization at this point. Overall I am extremely happy with where we stand and I do believe that we are headed in the right direction here. No fevers, chills, nausea, vomiting, or diarrhea. Integumentary (Hair, Skin) Wound #1 status is Open. Original cause of wound was Gradually Appeared. The date acquired was: 05/27/2022. The wound has been in treatment 2 weeks. The wound is located on the Left,Lateral Lower Leg. The wound measures 6.4cm length x 3cm width x 0.1cm depth; 15.08cm^2 area and 1.508cm^3 volume. There is Fat Layer (Subcutaneous Tissue) exposed. There is a large amount of serous drainage noted. The wound margin is indistinct and nonvisible. There is no granulation within the wound bed. There is a large (67-100%) amount of necrotic tissue within the wound bed including Eschar and Adherent Slough. Assessment Active Problems ICD-10 Chronic venous hypertension (idiopathic) with ulcer and inflammation of left lower extremity Non-pressure chronic ulcer of other part of left lower leg with fat layer exposed Essential (primary) hypertension Paroxysmal atrial fibrillation Long term (current) use of anticoagulants Procedures Wound #1 Pre-procedure diagnosis of Wound #1 is a Venous Leg Ulcer located on the Left,Lateral Lower Leg . There was a Three Layer Compression Therapy Procedure with a pre-treatment ABI of 1 by Massie Kluver. Post procedure Diagnosis  Wound #1: Same as Pre-Procedure Plan Follow-up Appointments: Wound #1 Left,Lateral Lower Leg: Return Appointment in 1 week. Nurse Visit as needed Bathing/ Shower/ Hygiene: May shower with wound dressing protected with water repellent cover or cast protector. Anesthetic (Use 'Patient Medications' Section for Anesthetic Order Entry): Lidocaine applied to wound bed Edema Control - Lymphedema / Segmental Compressive Device / Other: 3 Layer Compression System for Lymphedema. - left leg-change once weekly Elevate, Exercise Daily and Avoid Standing for Long Periods of Time. Elevate legs to the level of the heart and pump ankles as often as possible Elevate leg(s) parallel to the floor when sitting. Additional Orders / Instructions: Follow Nutritious Diet and Increase Protein Intake Medications-Please add to medication list.: CARLIS, BURNSWORTH ([id number]) [id number] Page 6 of 7 P.O. Antibiotics - complete antibiotics Other: - TCA to periwound WOUND #1: - Lower Leg Wound Laterality: Left, Lateral Cleanser: Soap and Water 1 x Per Week/30 Days Discharge Instructions: Gently cleanse wound with antibacterial soap, rinse and pat dry prior to dressing wounds Prim Dressing: Silvercel 4 1/4x 4 1/4 (in/in) 1 x Per Week/30 Days ary Discharge Instructions: Apply Silvercel 4 1/4x 4 1/4 (in/in) as instructed Secondary Dressing: Zetuvit Plus 4x4 (in/in) 1 x Per Week/30 Days Com pression Wrap: 3-LAYER WRAP - Profore Lite LF 3 Multilayer Compression Bandaging System 1 x Per Week/30 Days Discharge Instructions: Apply 3 multi-layer wrap as prescribed. If wrap slides more than 1 inch (mark on leg) call wound care for re-wrap. 1. I am good recommend based on what I am seeing that we go ahead and continue currently with the compression wrapping I do think this is doing a good job here. 2. I am good recommend as well that we have the patient continue to monitor for any signs of infection  or worsening. Obviously if anything changes she knows to contact the office and let me know. We will see patient back for reevaluation in 1 week here in the clinic. If anything worsens or changes patient will contact our office for additional recommendations. Electronic Signature(s) Signed: 07/31/2022 7:48:06 AM By: Worthy Keeler PA-C Entered By: Worthy Keeler on 07/31/2022 07:48:06 -------------------------------------------------------------------------------- SuperBill Details Patient Name: Date of Service: [name], DO  RCA S 07/30/2022 Medical Record Number: [id number] Patient Account Number: [id number] Date of Birth/Sex: Treating RN: 05-21-1926 (89 y.o. [name] Primary Care Provider: Frazier Richards Other Clinician: Massie Kluver Referring Provider: Treating Provider/Extender: [name] in Treatment: 2 Diagnosis Coding ICD-10 Codes Code Description [phone] Chronic venous hypertension (idiopathic) with ulcer and inflammation of left lower extremity L97.822 Non-pressure chronic ulcer of other part of left lower leg with fat layer exposed I10 Essential (primary) hypertension I48.0 Paroxysmal atrial fibrillation Z79.01 Long term (current) use of anticoagulants Facility Procedures : CPT4 Code: [id number] Description: (Facility Use Only) [phone] - [facility] ICD-10 Diagnosis Description I87.332 Chronic venous hypertension (idiopathic) with ulcer and inflammation of left lower ext Modifier: remity Quantity: 1 Physician Procedures : CPT4 Code Description Modifier [id number] - WC PHYS LEVEL 3 - EST PT ICD-10 Diagnosis Description I87.332 Chronic venous hypertension (idiopathic) with ulcer and inflammation of left lower extremity L97.822 Non-pressure chronic ulcer of other part  of left lower leg with fat layer exposed I10 Essential (primary) hypertension I48.0 Paroxysmal atrial fibrillation Malin, [name] ([id number])  [id number] Page 7 of Quantity: 1 7 Electronic Signature(s) Signed: 07/31/2022 7:54:41 AM By: Worthy Keeler PA-C Previous Signature: 07/30/2022 5:24:26 PM Version By: Massie Kluver Entered By: Worthy Keeler on 07/31/2022 07:54:40

## 2022-07-31 NOTE — Progress Notes (Signed)
ETOY, MCDONNELL ([id number]) [id number] Page 1 of 10 Visit Report for 07/30/2022 Arrival Information Details Patient Name: Date of Service: [name] Royals, DO RCA S 07/30/2022 2:00 PM Medical Record Number: [id number] Patient Account Number: [id number] Date of Birth/Sex: Treating RN: [birth year] (89 y.o. [name] Primary Care Abaigeal Moomaw: Frazier Richards Other Clinician: Massie Kluver Referring Aviyon Hocevar: Treating Dhrithi Riche/Extender: [name] in Treatment: 2 Visit Information History Since Last Visit All ordered tests and consults were completed: No Patient Arrived: Wheel Chair Added or deleted any medications: No Arrival Time: 13:59 Any new allergies or adverse reactions: No Transfer Assistance: EasyPivot Patient Lift Had a fall or experienced change in No Patient Identification Verified: Yes activities of daily living that may affect Secondary Verification Process Completed: Yes risk of falls: Patient Has Alerts: Yes Signs or symptoms of abuse/neglect since last visito No Patient Alerts: Patient on Blood Thinner Hospitalized since last visit: No NOT DIABETIC Implantable device outside of the clinic excluding No Eliquis cellular tissue based products placed in the center since last visit: Has Dressing in Place as Prescribed: Yes Has Compression in Place as Prescribed: Yes Pain Present Now: No Electronic Signature(s) Signed: 07/30/2022 5:24:26 PM By: Massie Kluver Entered By: Massie Kluver on 07/30/2022 14:05:47 -------------------------------------------------------------------------------- Clinic Level of Care Assessment Details Patient Name: Date of Service: [name] MSEY, DO RCA S 07/30/2022 2:00 PM Medical Record Number: [id number] Patient Account Number: [id number] Date of Birth/Sex: Treating RN: [birth year] (89 y.o. [name] Primary Care Darlynn Ricco: Frazier Richards Other Clinician: Massie Kluver Referring  Amauria Younts: Treating Nayzeth Altman/Extender: [name] in Treatment: 2 Clinic Level of Care Assessment Items TOOL 1 Quantity Score '[]'$  - 0 Use when EandM and Procedure is performed on INITIAL visit ASSESSMENTS - Nursing Assessment / Reassessment '[]'$  - 0 General Physical Exam (combine w/ comprehensive assessment (listed just below) when performed on new pt. [id number], [name] ([id number]) [id number] Page 2 of 10 '[]'$  - 0 Comprehensive Assessment (HX, ROS, Risk Assessments, Wounds Hx, etc.) ASSESSMENTS - Wound and Skin Assessment / Reassessment '[]'$  - 0 Dermatologic / Skin Assessment (not related to wound area) ASSESSMENTS - Ostomy and/or Continence Assessment and Care '[]'$  - 0 Incontinence Assessment and Management '[]'$  - 0 Ostomy Care Assessment and Management (repouching, etc.) PROCESS - Coordination of Care '[]'$  - 0 Simple Patient / Family Education for ongoing care '[]'$  - 0 Complex (extensive) Patient / Family Education for ongoing care '[]'$  - 0 Staff obtains Programmer, systems, Records, T Results / Process Orders est '[]'$  - 0 Staff telephones HHA, Nursing Homes / Clarify orders / etc '[]'$  - 0 Routine Transfer to another Facility (non-emergent condition) '[]'$  - 0 Routine Hospital Admission (non-emergent condition) '[]'$  - 0 New Admissions / Biomedical engineer / Ordering NPWT Apligraf, etc. , '[]'$  - 0 Emergency Hospital Admission (emergent condition) PROCESS - Special Needs '[]'$  - 0 Pediatric / Minor Patient Management '[]'$  - 0 Isolation Patient Management '[]'$  - 0 Hearing / Language / Visual special needs '[]'$  - 0 Assessment of Community assistance (transportation, D/C planning, etc.) '[]'$  - 0 Additional assistance / Altered mentation '[]'$  - 0 Support Surface(s) Assessment (bed, cushion, seat, etc.) INTERVENTIONS - Miscellaneous '[]'$  - 0 External ear exam '[]'$  - 0 Patient Transfer (multiple staff / Civil Service fast streamer / Similar devices) '[]'$  - 0 Simple Staple /  Suture removal (25 or less) '[]'$  - 0 Complex Staple / Suture removal (26 or more) '[]'$  - 0 Hypo/Hyperglycemic Management (do not check if billed separately) '[]'$  - 0 Ankle /  Brachial Index (ABI) - do not check if billed separately Has the patient been seen at the hospital within the last three years: Yes Total Score: 0 Level Of Care: ____ Electronic Signature(s) Signed: 07/30/2022 5:24:26 PM By: Massie Kluver Entered By: Massie Kluver on 07/30/2022 15:04:48 -------------------------------------------------------------------------------- Compression Therapy Details Patient Name: Date of Service: [name] Bufford Spikes, DO RCA S 07/30/2022 2:00 PM Medical Record Number: [id number] Patient Account Number: [id number] Date of Birth/Sex: Treating RN: [birth year] (89 y.o. [name] Primary Care Tajay Muzzy: Frazier Richards Other Clinician: Massie Kluver [place], [name] ([id number]) [id number] Page 3 of 10 Referring Giavanni Zeitlin: Treating Raynie Steinhaus/Extender: [name] in Treatment: 2 Compression Therapy Performed for Wound Assessment: Wound #1 Left,Lateral Lower Leg Performed By: [name], [name], Compression Type: Three Layer Pre Treatment ABI: 1 Post Procedure Diagnosis Same as Pre-procedure Electronic Signature(s) Signed: 07/30/2022 5:24:26 PM By: Massie Kluver Entered By: Massie Kluver on 07/30/2022 15:04:21 -------------------------------------------------------------------------------- Encounter Discharge Information Details Patient Name: Date of Service: [name] Bufford Spikes, DO RCA S 07/30/2022 2:00 PM Medical Record Number: [id number] Patient Account Number: [id number] Date of Birth/Sex: Treating RN: [birth year] (89 y.o. [name] Primary Care [name]: Frazier Richards Other Clinician: Massie Kluver Referring Deandrea Vanpelt: Treating Memori Sammon/Extender: [name] in Treatment: 2 Encounter Discharge Information  Items Discharge Condition: Stable Ambulatory Status: Wheelchair Discharge Destination: Home Transportation: Private Auto Accompanied By: son Schedule Follow-up Appointment: Yes Clinical Summary of Care: Electronic Signature(s) Signed: 07/30/2022 5:24:26 PM By: Massie Kluver Entered By: Massie Kluver on 07/30/2022 15:41:51 -------------------------------------------------------------------------------- Lower Extremity Assessment Details Patient Name: Date of Service: [name] MSEY, DO RCA S 07/30/2022 2:00 PM Medical Record Number: [id number] Patient Account Number: [id number] Date of Birth/Sex: Treating RN: Aug 21, 1925 (89 y.o. [name] Primary Care Cherokee Boccio: Frazier Richards Other Clinician: Massie Kluver Referring Kynadi Dragos: Treating Harper Vandervoort/Extender: [name] in Treatment: 2 [place], [place] ([id number]) [id number] Page 4 of 10 Edema Assessment Assessed: [Left: Yes] [Right: No] Edema: [Left: Ye] [Right: s] Calf Left: Right: Point of Measurement: 30 cm From Medial Instep 30.8 cm Ankle Left: Right: Point of Measurement: 12 cm From Medial Instep 19.7 cm Vascular Assessment Pulses: Dorsalis Pedis Palpable: [Left:Yes] Electronic Signature(s) Signed: 07/30/2022 5:24:26 PM By: Massie Kluver Signed: 07/31/2022 1:40:16 PM By: Rosalio Loud MSN RN CNS WTA Entered By: Massie Kluver on 07/30/2022 14:41:44 -------------------------------------------------------------------------------- Multi Wound Chart Details Patient Name: Date of Service: [name] Bufford Spikes, DO RCA S 07/30/2022 2:00 PM Medical Record Number: [id number] Patient Account Number: [id number] Date of Birth/Sex: Treating RN: [birth year] (89 y.o. [name] Primary Care Markice Torbert: Frazier Richards Other Clinician: Massie Kluver Referring Miski Feldpausch: Treating Kolbee Stallman/Extender: [name] in Treatment: 2 Vital Signs Height(in): Pulse(bpm):  74 Weight(lbs): 106 Blood Pressure(mmHg): 145/79 Body Mass Index(BMI): Temperature(F): 97.7 Respiratory Rate(breaths/min): 16 [1:Photos:] [N/A:N/A] Left, Lateral Lower Leg N/A N/A Wound Location: Gradually Appeared N/A N/A Wounding Event: Venous Leg Ulcer N/A N/A Primary Etiology: Hypertension, Dementia, Received N/A N/A Comorbid History: Chemotherapy 05/27/2022 N/A N/A Date Acquired: 2 N/A N/A Weeks of Treatment: Open N/A N/A Wound StatusEMALIE, MCWETHY ([id number]) [id number] Page 5 of 10 No N/A N/A Wound Recurrence: 6.4x3x0.1 N/A N/A Measurements L x W x D (cm) 15.08 N/A N/A A (cm) : rea 1.508 N/A N/A Volume (cm) : 42.70% N/A N/A % Reduction in Area: 71.30% N/A N/A % Reduction in Volume: Full Thickness Without Exposed N/A N/A Classification: Support Structures Large N/A N/A Exudate A mount: Serous N/A N/A Exudate Type: amber N/A N/A Exudate Color: Indistinct, nonvisible N/A  N/A Wound Margin: None Present (0%) N/A N/A Granulation Amount: Large (67-100%) N/A N/A Necrotic Amount: Eschar, Adherent Slough N/A N/A Necrotic Tissue: Fat Layer (Subcutaneous Tissue): Yes N/A N/A Exposed Structures: Fascia: No Tendon: No Muscle: No Joint: No Bone: No None N/A N/A Epithelialization: Treatment Notes Electronic Signature(s) Signed: 07/30/2022 5:24:26 PM By: Massie Kluver Entered By: Massie Kluver on 07/30/2022 14:57:46 -------------------------------------------------------------------------------- Multi-Disciplinary Care Plan Details Patient Name: Date of Service: [name] Royals, DO RCA S 07/30/2022 2:00 PM Medical Record Number: [id number] Patient Account Number: [id number] Date of Birth/Sex: Treating RN: Apr 21, 1926 (89 y.o. [name] Primary Care Analise Glotfelty: Frazier Richards Other Clinician: Massie Kluver Referring Lorrin Bodner: Treating Jaila Schellhorn/Extender: [name] in Treatment: 2 Active  Inactive Necrotic Tissue Nursing Diagnoses: Impaired tissue integrity related to necrotic/devitalized tissue Knowledge deficit related to management of necrotic/devitalized tissue Goals: Necrotic/devitalized tissue will be minimized in the wound bed Date Initiated: 07/16/2022 Target Resolution Date: 07/23/2022 Goal Status: Active Patient/caregiver will verbalize understanding of reason and process for debridement of necrotic tissue Date Initiated: 07/16/2022 Target Resolution Date: 07/23/2022 Goal Status: Active Interventions: Assess patient pain level pre-, during and post procedure and prior to discharge Provide education on necrotic tissue and debridement process Treatment Activities: Apply topical anesthetic as ordered : 07/16/2022 Excisional debridement : 07/16/2022 [name] ([id number]) [id number] Page 6 of 10 Notes: Orientation to the Wound Care Program Nursing Diagnoses: Knowledge deficit related to the wound healing center program Goals: Patient/caregiver will verbalize understanding of the [place] Date Initiated: 07/16/2022 Target Resolution Date: 07/16/2022 Goal Status: Active Interventions: Provide education on orientation to the wound center Notes: Pain, Acute or Chronic Nursing Diagnoses: Pain Management - Non-cyclic Acute (Procedural) Potential alteration in comfort, pain Goals: Patient will verbalize adequate pain control and receive pain control interventions during procedures as needed Date Initiated: 07/16/2022 Target Resolution Date: 07/23/2022 Goal Status: Active Patient/caregiver will verbalize adequate pain control between visits Date Initiated: 07/16/2022 Target Resolution Date: 07/23/2022 Goal Status: Active Patient/caregiver will verbalize comfort level met Date Initiated: 07/16/2022 Target Resolution Date: 07/16/2022 Goal Status: Active Interventions: Reposition patient for  comfort Treatment Activities: Administer pain control measures as ordered : 07/16/2022 Notes: Venous Leg Ulcer Nursing Diagnoses: Knowledge deficit related to disease process and management Potential for venous Insuffiency (use before diagnosis confirmed) Goals: Patient will maintain optimal edema control Date Initiated: 07/16/2022 Target Resolution Date: 07/23/2022 Goal Status: Active Patient/caregiver will verbalize understanding of disease process and disease management Date Initiated: 07/16/2022 Target Resolution Date: 07/23/2022 Goal Status: Active Interventions: Assess peripheral edema status every visit. Compression as ordered Notes: Wound/Skin Impairment Nursing Diagnoses: Impaired tissue integrity Knowledge deficit related to smoking impact on wound healing Knowledge deficit related to ulceration/compromised skin integrity Goals: Patient/caregiver will verbalize understanding of skin care regimen Date Initiated: 07/16/2022 Target Resolution Date: 07/16/2022 Goal Status: Active Ulcer/skin breakdown will have a volume reduction of 30% by week 4 Date Initiated: 07/16/2022 Target Resolution Date: 08/13/2022 Goal Status: [name], [name] ([id number]) [id number] Page 7 of 10 Interventions: Assess patient/caregiver ability to obtain necessary supplies Assess patient/caregiver ability to perform ulcer/skin care regimen upon admission and as needed Assess ulceration(s) every visit Provide education on ulcer and skin care Treatment Activities: Skin care regimen initiated : 07/16/2022 Topical wound management initiated : 07/16/2022 Notes: Electronic Signature(s) Signed: 07/30/2022 5:24:26 PM By: Massie Kluver Signed: 07/31/2022 1:40:16 PM By: Rosalio Loud MSN RN CNS WTA Entered By: Massie Kluver on 07/30/2022 15:41:16 -------------------------------------------------------------------------------- Pain Assessment Details Patient Name: Date  of Service: [name] MSEY,  DO RCA S 07/30/2022 2:00 PM Medical Record Number: [id number] Patient Account Number: [id number] Date of Birth/Sex: Treating RN: 03/02/1926 (89 y.o. [name] Primary Care Kieffer Blatz: Frazier Richards Other Clinician: Massie Kluver Referring Kourtney Montesinos: Treating Kampbell Holaway/Extender: [name] in Treatment: 2 Active Problems Location of Pain Severity and Description of Pain Patient Has Paino No Site Locations Pain Management and Medication Current Pain Management: Electronic Signature(s) Signed: 07/30/2022 5:24:26 PM By: Massie Kluver Signed: 07/31/2022 1:40:16 PM By: Rosalio Loud MSN RN CNS WTA Entered By: Massie Kluver on 07/30/2022 14:24:26 [name] ([id number]) [id number] Page 8 of 10 -------------------------------------------------------------------------------- Patient/Caregiver Education Details Patient Name: Date of Service: [name] Royals, DO RCA S 1/4/2024andnbsp2:00 PM Medical Record Number: [id number] Patient Account Number: [id number] Date of Birth/Gender: Treating RN: 15-Sep-1925 (89 y.o. [name] Primary Care Physician: Frazier Richards Other Clinician: Massie Kluver Referring Physician: Treating Physician/Extender: [name] in Treatment: 2 Education Assessment Education Provided To: Patient Education Topics Provided Wound/Skin Impairment: Handouts: Other: continue wound care as directed Methods: Explain/Verbal Responses: State content correctly Electronic Signature(s) Signed: 07/30/2022 5:24:26 PM By: Massie Kluver Entered By: Massie Kluver on 07/30/2022 15:41:11 -------------------------------------------------------------------------------- Wound Assessment Details Patient Name: Date of Service: [name] MSEY, DO RCA S 07/30/2022 2:00 PM Medical Record Number: [id number] Patient Account Number: [id number] Date of Birth/Sex: Treating RN: [birth year] (89  y.o. [name] Primary Care Farra Nikolic: Frazier Richards Other Clinician: Massie Kluver Referring Yanice Maqueda: Treating Desree Leap/Extender: Elvina Sidle Weeks in Treatment: 2 Wound Status Wound Number: 1 Primary Etiology: Venous Leg Ulcer Wound Location: Left, Lateral Lower Leg Wound Status: Open Wounding Event: Gradually Appeared Comorbid History: Hypertension, Dementia, Received Chemotherapy Date Acquired: 05/27/2022 Weeks Of Treatment: 2 Clustered Wound: No Photos AMALYA, SALMONS ([id number]) [id number] Page 9 of 10 Wound Measurements Length: (cm) 6.4 Width: (cm) 3 Depth: (cm) 0.1 Area: (cm) 15.08 Volume: (cm) 1.508 % Reduction in Area: 42.7% % Reduction in Volume: 71.3% Epithelialization: None Wound Description Classification: Full Thickness Without Exposed Support Structures Wound Margin: Indistinct, nonvisible Exudate Amount: Large Exudate Type: Serous Exudate Color: amber Foul Odor After Cleansing: No Slough/Fibrino Yes Wound Bed Granulation Amount: None Present (0%) Exposed Structure Necrotic Amount: Large (67-100%) Fascia Exposed: No Necrotic Quality: Eschar, Adherent Slough Fat Layer (Subcutaneous Tissue) Exposed: Yes Tendon Exposed: No Muscle Exposed: No Joint Exposed: No Bone Exposed: No Treatment Notes Wound #1 (Lower Leg) Wound Laterality: Left, Lateral Cleanser Soap and Water Discharge Instruction: Gently cleanse wound with antibacterial soap, rinse and pat dry prior to dressing wounds Peri-Wound Care Topical Primary Dressing Silvercel 4 1/4x 4 1/4 (in/in) Discharge Instruction: Apply Silvercel 4 1/4x 4 1/4 (in/in) as instructed Secondary Dressing Zetuvit Plus 4x4 (in/in) Secured With Compression Wrap 3-LAYER WRAP - Profore Lite LF 3 Multilayer Compression Bandaging System Discharge Instruction: Apply 3 multi-layer wrap as prescribed. If wrap slides more than 1 inch (mark on leg) call wound care  for re-wrap. Compression Stockings Add-Ons Electronic Signature(s) Signed: 07/30/2022 5:24:26 PM By: Massie Kluver Signed: 07/31/2022 1:40:16 PM By: Rosalio Loud MSN RN CNS WTA Entered By: Massie Kluver on 07/30/2022 14:39:22 [name] ([id number]) [id number] Page 10 of 10 -------------------------------------------------------------------------------- Vitals Details Patient Name: Date of Service: [name] Royals, DO RCA S 07/30/2022 2:00 PM Medical Record Number: [id number] Patient Account Number: [id number] Date of Birth/Sex: Treating RN: 11-Jun-1926 (89 y.o. [name] Primary Care Jerrian Mells: Frazier Richards Other Clinician: Massie Kluver Referring Theodore Virgin: Treating Zaleigh Bermingham/Extender: [name] in Treatment: 2 Vital Signs Time Taken: 14:21 Temperature (  F): 97.7 Weight (lbs): 106 Pulse (bpm): 74 Respiratory Rate (breaths/min): 16 Blood Pressure (mmHg): 145/79 Reference Range: 80 - 120 mg / dl Electronic Signature(s) Signed: 07/30/2022 5:24:26 PM By: Massie Kluver Entered By: Massie Kluver on 07/30/2022 14:24:22

## 2022-08-06 DIAGNOSIS — I87332 Chronic venous hypertension (idiopathic) with ulcer and inflammation of left lower extremity: Secondary | ICD-10-CM | POA: Diagnosis not present

## 2022-08-07 NOTE — Progress Notes (Signed)
[name] ([id number]) [id number] Page 1 of 4 Visit Report for 08/06/2022 Arrival Information Details Patient Name: Date of Service: [name] Royals, DO RCA S 08/06/2022 10:45 A M Medical Record Number: [id number] Patient Account Number: [id number] Date of Birth/Sex: Treating RN: 1926-01-09 (89 y.o. [name], [name] Primary Care Hall Birchard: [name] Other Clinician: Massie [name] Referring Jw Covin: Treating [name]/Extender: [name] in Treatment: 3 Visit Information History Since Last Visit All ordered tests and consults were completed: No Patient Arrived: Wheel Chair Added or deleted any medications: No Arrival Time: 10:51 Any new allergies or adverse reactions: No Transfer Assistance: EasyPivot Patient Lift Had a fall or experienced change in No Patient Identification Verified: Yes activities of daily living that may affect Secondary Verification Process Completed: Yes risk of falls: Patient Has Alerts: Yes Signs or symptoms of abuse/neglect since last visito No Patient Alerts: Patient on Blood Thinner Hospitalized since last visit: No NOT DIABETIC Implantable device outside of the clinic excluding No Eliquis cellular tissue based products placed in the center since last visit: Has Dressing in Place as Prescribed: Yes Has Compression in Place as Prescribed: Yes Pain Present Now: No Electronic Signature(s) Signed: 08/07/2022 1:23:45 PM By: [name] Entered By: [name] on 08/06/2022 10:58:44 -------------------------------------------------------------------------------- Clinic Level of Care Assessment Details Patient Name: Date of Service: [name] MSEY, DO RCA S 08/06/2022 10:45 A M Medical Record Number: [id number] Patient Account Number: [id number] Date of Birth/Sex: Treating RN: 1925-12-11 (89 y.o. [name] Primary Care Blu Mcglaun: [name] Other Clinician: Massie [name] Referring  [name]: Treating [name]/Extender: [name] in Treatment: 3 Clinic Level of Care Assessment Items TOOL 1 Quantity Score '[]'$  - 0 Use when EandM and Procedure is performed on INITIAL visit ASSESSMENTS - Nursing Assessment / Reassessment '[]'$  - 0 General Physical Exam (combine w/ comprehensive assessment (listed just below) when performed on new pt. [address]AE, [name] ([id number]) [id number] Page 2 of 4 '[]'$  - 0 Comprehensive Assessment (HX, ROS, Risk Assessments, Wounds Hx, etc.) ASSESSMENTS - Wound and Skin Assessment / Reassessment '[]'$  - 0 Dermatologic / Skin Assessment (not related to wound area) ASSESSMENTS - Ostomy and/or Continence Assessment and Care '[]'$  - 0 Incontinence Assessment and Management '[]'$  - 0 Ostomy Care Assessment and Management (repouching, etc.) PROCESS - Coordination of Care '[]'$  - 0 Simple Patient / Family Education for ongoing care '[]'$  - 0 Complex (extensive) Patient / Family Education for ongoing care '[]'$  - 0 Staff obtains Programmer, systems, Records, T Results / Process Orders est '[]'$  - 0 Staff telephones HHA, Nursing Homes / Clarify orders / etc '[]'$  - 0 Routine Transfer to another Facility (non-emergent condition) '[]'$  - 0 Routine Hospital Admission (non-emergent condition) '[]'$  - 0 New Admissions / Biomedical engineer / Ordering NPWT Apligraf, etc. , '[]'$  - 0 Emergency Hospital Admission (emergent condition) PROCESS - Special Needs '[]'$  - 0 Pediatric / Minor Patient Management '[]'$  - 0 Isolation Patient Management '[]'$  - 0 Hearing / Language / Visual special needs '[]'$  - 0 Assessment of Community assistance (transportation, D/C planning, etc.) '[]'$  - 0 Additional assistance / Altered mentation '[]'$  - 0 Support Surface(s) Assessment (bed, cushion, seat, etc.) INTERVENTIONS - Miscellaneous '[]'$  - 0 External ear exam '[]'$  - 0 Patient Transfer (multiple staff / Civil Service fast streamer / Similar devices) '[]'$  - 0 Simple Staple /  Suture removal (25 or less) '[]'$  - 0 Complex Staple / Suture removal (26 or more) '[]'$  - 0 Hypo/Hyperglycemic Management (do not check if billed separately) '[]'$  - 0  Ankle / Brachial Index (ABI) - do not check if billed separately Has the patient been seen at the hospital within the last three years: Yes Total Score: 0 Level Of Care: ____ Electronic Signature(s) Signed: 08/07/2022 1:23:45 PM By: [name] Entered By: [name] on 08/06/2022 11:21:29 -------------------------------------------------------------------------------- Compression Therapy Details Patient Name: Date of Service: [name] Bufford Spikes, DO RCA S 08/06/2022 10:45 A M Medical Record Number: [id number] Patient Account Number: [id number] Date of Birth/Sex: Treating RN: [birth year] (89 y.o. [name] Primary Care Alvan Culpepper: [name] Other Clinician: Massie [name] [name] ([id number]) [id number] Page 3 of 4 Referring Manoah Deckard: Treating Nakeya Adinolfi/Extender: [name] in Treatment: 3 Compression Therapy Performed for Wound Assessment: Wound #1 Left,Lateral Lower Leg Performed By: [name], [name], Compression Type: Three Layer Pre Treatment ABI: 1 Electronic Signature(s) Signed: 08/07/2022 1:23:45 PM By: [name] Entered By: [name] on 08/06/2022 10:59:28 -------------------------------------------------------------------------------- Encounter Discharge Information Details Patient Name: Date of Service: [name] Bufford Spikes, DO RCA S 08/06/2022 10:45 A M Medical Record Number: [id number] Patient Account Number: [id number] Date of Birth/Sex: Treating RN: [birth year] (89 y.o. [name], [name] Primary Care Sabirin Baray: [name] Other Clinician: Massie [name] Referring Henry Demeritt: Treating Tashira Torre/Extender: [name] in Treatment: 3 Encounter Discharge Information Items Discharge Condition: Stable Ambulatory Status:  Wheelchair Discharge Destination: Skilled Nursing Facility Telephoned: No Orders Sent: Yes Transportation: Private Auto Accompanied By: son Schedule Follow-up Appointment: Yes Clinical Summary of Care: Electronic Signature(s) Signed: 08/07/2022 1:23:45 PM By: [name] Entered By: [name] on 08/06/2022 11:21:23 -------------------------------------------------------------------------------- Wound Assessment Details Patient Name: Date of Service: [name] MSEY, DO RCA S 08/06/2022 10:45 A M Medical Record Number: [id number] Patient Account Number: [id number] Date of Birth/Sex: Treating RN: 1926-05-16 (89 y.o. [name], [name] Primary Care Leiliana Foody: [name] Other Clinician: Massie [name] Referring Briannon Boggio: Treating Lory Nowaczyk/Extender: Elvina Sidle Weeks in Treatment: 3 Wound Status Wound Number: 1 Primary Etiology: Venous Leg Ulcer [name], [name] ([id number]) [id number] Page 4 of 4 Wound Location: Left, Lateral Lower Leg Wound Status: Open Wounding Event: Gradually Appeared Comorbid History: Hypertension, Dementia, Received Chemotherapy Date Acquired: 05/27/2022 Weeks Of Treatment: 3 Clustered Wound: No Wound Measurements Length: (cm) 6.4 Width: (cm) 3 Depth: (cm) 0.1 Area: (cm) 15.08 Volume: (cm) 1.508 % Reduction in Area: 42.7% % Reduction in Volume: 71.3% Epithelialization: None Wound Description Classification: Full Thickness Without Exposed Support Structures Wound Margin: Indistinct, nonvisible Exudate Amount: Large Exudate Type: Serous Exudate Color: amber Foul Odor After Cleansing: No Slough/Fibrino Yes Wound Bed Granulation Amount: None Present (0%) Exposed Structure Necrotic Amount: Large (67-100%) Fascia Exposed: No Necrotic Quality: Eschar, Adherent Slough Fat Layer (Subcutaneous Tissue) Exposed: Yes Tendon Exposed: No Muscle Exposed: No Joint Exposed: No Bone Exposed: No Treatment  Notes Wound #1 (Lower Leg) Wound Laterality: Left, Lateral Cleanser Soap and Water Discharge Instruction: Gently cleanse wound with antibacterial soap, rinse and pat dry prior to dressing wounds Peri-Wound Care Topical Primary Dressing Silvercel 4 1/4x 4 1/4 (in/in) Discharge Instruction: Apply Silvercel 4 1/4x 4 1/4 (in/in) as instructed Secondary Dressing Zetuvit Plus 4x4 (in/in) Secured With Compression Wrap 3-LAYER WRAP - Profore Lite LF 3 Multilayer Compression Bandaging System Discharge Instruction: Apply 3 multi-layer wrap as prescribed. If wrap slides more than 1 inch (mark on leg) call wound care for re-wrap. Compression Stockings Add-Ons Electronic Signature(s) Signed: 08/06/2022 3:17:21 PM By: Gretta Cool, BSN, RN, CWS, [name] RN, BSN Signed: 08/07/2022 1:23:45 PM By: [name] Entered By: [name] on 08/06/2022 10:59:03

## 2022-08-07 NOTE — Progress Notes (Signed)
[name] ([id number]) [id number] Page 1 of 2 Visit Report for 08/06/2022 Physician Orders Details Patient Name: Date of Service: [name] Royals, DO RCA S 08/06/2022 10:45 A M Medical Record Number: [id number] Patient Account Number: [id number] Date of Birth/Sex: Treating RN: [birth year] (89 y.o. [name] Primary Care Provider: Frazier Richards Other Clinician: Massie Kluver Referring Provider: Treating Provider/Extender: [name] in Treatment: 3 Verbal / Phone Orders: No Diagnosis Coding Follow-up Appointments Wound #1 Left,Lateral Lower Leg Return Appointment in 1 week. Nurse Visit as needed Bathing/ Shower/ Hygiene May shower with wound dressing protected with water repellent cover or cast protector. Anesthetic (Use 'Patient Medications' Section for Anesthetic Order Entry) Lidocaine applied to wound bed Edema Control - Lymphedema / Segmental Compressive Device / Other Left Lower Extremity 3 Layer Compression System for Lymphedema. - left leg-change once weekly Elevate, Exercise Daily and A void Standing for Long Periods of Time. Elevate legs to the level of the heart and pump ankles as often as possible Elevate leg(s) parallel to the floor when sitting. Additional Orders / Instructions Follow Nutritious Diet and Increase Protein Intake Medications-Please add to medication list. ntibiotics - complete antibiotics P.O. A Other: - TCA to periwound Wound Treatment Wound #1 - Lower Leg Wound Laterality: Left, Lateral Cleanser: Soap and Water 1 x Per Week/30 Days Discharge Instructions: Gently cleanse wound with antibacterial soap, rinse and pat dry prior to dressing wounds Prim Dressing: Silvercel 4 1/4x 4 1/4 (in/in) 1 x Per Week/30 Days ary Discharge Instructions: Apply Silvercel 4 1/4x 4 1/4 (in/in) as instructed Secondary Dressing: Zetuvit Plus 4x4 (in/in) 1 x Per Week/30 Days Compression Wrap: 3-LAYER WRAP -  Profore Lite LF 3 Multilayer Compression Bandaging System 1 x Per Week/30 Days Discharge Instructions: Apply 3 multi-layer wrap as prescribed. If wrap slides more than 1 inch (mark on leg) call wound care for re-wrap. Electronic Signature(s) Signed: 08/06/2022 5:05:26 PM By: Worthy Keeler PA-C Signed: 08/07/2022 1:23:45 PM By: Massie Kluver Entered By: Massie Kluver on 08/06/2022 11:21:15 [name] ([id number]) [id number] Page 2 of 2 -------------------------------------------------------------------------------- SuperBill Details Patient Name: Date of Service: [name] MSEY, DO RCA S 08/06/2022 Medical Record Number: [id number] Patient Account Number: [id number] Date of Birth/Sex: Treating RN: 1925/10/27 (89 y.o. [name] Primary Care Provider: Frazier Richards Other Clinician: Massie Kluver Referring Provider: Treating Provider/Extender: [name] in Treatment: 3 Diagnosis Coding ICD-10 Codes Code Description [phone] Chronic venous hypertension (idiopathic) with ulcer and inflammation of left lower extremity L97.822 Non-pressure chronic ulcer of other part of left lower leg with fat layer exposed I10 Essential (primary) hypertension I48.0 Paroxysmal atrial fibrillation Z79.01 Long term (current) use of anticoagulants Facility Procedures : CPT4 Code: [id number] Description: (Facility Use Only) [phone] - [facility] ICD-10 Diagnosis Description I87.332 Chronic venous hypertension (idiopathic) with ulcer and inflammation of left lowe Modifier: r extremity Quantity: 1 Electronic Signature(s) Signed: 08/06/2022 5:05:26 PM By: Worthy Keeler PA-C Signed: 08/07/2022 1:23:45 PM By: Massie Kluver Entered By: Massie Kluver on 08/06/2022 11:21:46

## 2022-08-13 ENCOUNTER — Encounter: Payer: Medicare Other | Admitting: Physician Assistant

## 2022-08-13 DIAGNOSIS — I87332 Chronic venous hypertension (idiopathic) with ulcer and inflammation of left lower extremity: Secondary | ICD-10-CM | POA: Diagnosis not present

## 2022-08-13 NOTE — Progress Notes (Addendum)
MARTHELLA, OSORNO ([id number]) [id number] Page 1 of 8 Visit Report for 08/13/2022 Chief Complaint Document Details Patient Name: Date of Service: [name], [name] 08/13/2022 2:15 PM Medical Record Number: [id number] Patient Account Number: [id number] Date of Birth/Sex: Treating RN: May 28, 1926 (89 y.o. [name] Primary Care Provider: Frazier Richards Other Clinician: Referring Provider: Treating Provider/Extender: [name] in Treatment: 4 Information Obtained from: Patient Chief Complaint Left lateral LE ulcer Electronic Signature([name]) Signed: 08/13/2022 2:14:01 PM By: Worthy Keeler PA-C Entered By: Worthy Keeler on 08/13/2022 14:14:01 -------------------------------------------------------------------------------- HPI Details Patient Name: Date of Service: [name], [name] 08/13/2022 2:15 PM Medical Record Number: [id number] Patient Account Number: [id number] Date of Birth/Sex: Treating RN: 23-May-1926 (89 y.o. [name] Primary Care Provider: Frazier Richards Other Clinician: Referring Provider: Treating Provider/Extender: [name] in Treatment: 4 History of Present Illness HPI Description: 07-16-2022 upon evaluation today patient presents for initial inspection here in our clinic concerning a wound on the left lateral lower extremity. This is an area that has been present since around May 27, 2022 according to what the patient tells me today. With that being said she does live in assisted living facility. She is seen with her son in the office today. As far as past medical history is concerned she does have a history of breast cancer in 2003 which she did obviously survive. She also had a right knee replacement. In 2009 she had an IVC filter and heart ablation due to atrial fibrillation. With regard to the remainder of her medical history again she is on anticoagulant therapy at this  point due to atrial fibrillation, she has hypertension, and chronic venous insufficiency based on what I am seeing currently. In regard to the wound she is currently on Keflex which should be completed on the 25th of this month. Other than that she has had various things applied to the wound including different creams but then at other times she also states that she has had nothing put on and been told to just leave it open to air. There is been a lot of confusion about the best treatment course with regard to her wound which is obviously not good. I discussed with her today that I think we can have the best plan going forward and that that will include keeping this covered but nonetheless it should help it to heal the most effectively as well. 12/29; left lower leg chronic venous insufficiency. She complains fairly bitterly about the tightness of the wrap. She was put on antibiotics last week which I believe is Keflex. She lives in an assisted living 07-31-2021 upon evaluation today patient appears to be doing well currently in regard to her wound. She is actually been tolerating the dressing changes this is measuring smaller and looking better. I think we are on the right track I [name] believe that the current compression wrap is helping significantly with her [facility] ([id number]) [id number] Page 2 of 8 this point. Overall I [name] not see any signs of infection locally nor systemically which is great news. No fevers, chills, nausea, vomiting, or diarrhea. 08-13-2022 upon evaluation today patient'[name] wound on her leg actually showing signs of erythema around the edges of the wound that she is also still having quite a bit of drainage all things considered. Fortunately I [name] not see any evidence of infection systemically though locally I definitely see some evidence here both with erythema, warmth, and drainage.  She has been using the silver alginate dressing we have  also had an 3 layer compression wrap though she did not feel good felt very tight at the top of the wrap which is the main concern she has today. Electronic Signature([name]) Signed: 08/13/2022 5:28:07 PM By: Worthy Keeler PA-C Entered By: Worthy Keeler on 08/13/2022 17:28:07 -------------------------------------------------------------------------------- Physical Exam Details Patient Name: Date of Service: [name], [name] 08/13/2022 2:15 PM Medical Record Number: [id number] Patient Account Number: [id number] Date of Birth/Sex: Treating RN: December 28, 1925 (89 y.o. [name] Primary Care Provider: Frazier Richards Other Clinician: Referring Provider: Treating Provider/Extender: [name] in Treatment: 4 Constitutional Well-nourished and well-hydrated in no acute distress. Respiratory normal breathing without difficulty. Psychiatric this patient is able to make decisions and demonstrates good insight into disease process. Alert and Oriented x 3. pleasant and cooperative. Notes Upon inspection patient'[name] wound bed actually showed signs of good granulation epithelization at this point. Fortunately there does not appear to be any evidence of active infection at this time systemically though locally I definitely see some issues here. With increased drainage and erythema and warmth I think that the patient would benefit from getting her on a different antibiotic I think the Keflex she has previously been on has not really been beneficial. Prior to that she was apparently also on Augmentin but this was back in October. The most recent prescription with Keflex was December 18 through 25. Electronic Signature([name]) Signed: 08/13/2022 5:28:57 PM By: Worthy Keeler PA-C Entered By: Worthy Keeler on 08/13/2022 17:28:57 -------------------------------------------------------------------------------- Physician Orders Details Patient Name: Date of Service: [name], [name]  08/13/2022 2:15 PM Medical Record Number: [id number] Patient Account Number: [id number] Date of Birth/Sex: Treating RN: [birth year] (89 y.o. [name] Primary Care Provider: Frazier Richards Other Clinician: Referring Provider: Treating Provider/Extender: [name] in Treatment: [place], [place] ([id number]) [id number] Page 3 of 8 Verbal / Phone Orders: No Diagnosis Coding ICD-10 Coding Code Description [phone] Chronic venous hypertension (idiopathic) with ulcer and inflammation of left lower extremity L97.822 Non-pressure chronic ulcer of other part of left lower leg with fat layer exposed I10 Essential (primary) hypertension I48.0 Paroxysmal atrial fibrillation Z79.01 Long term (current) use of anticoagulants Follow-up Appointments Wound #1 Left,Lateral Lower Leg Return Appointment in 1 week. Nurse Visit as needed Bathing/ Shower/ Hygiene May shower with wound dressing protected with water repellent cover or cast protector. Anesthetic (Use 'Patient Medications' Section for Anesthetic Order Entry) Lidocaine applied to wound bed Edema Control - Lymphedema / Segmental Compressive Device / Other Left Lower Extremity Elevate, Exercise Daily and A void Standing for Long Periods of Time. Elevate legs to the level of the heart and pump ankles as often as possible Elevate leg([name]) parallel to the floor when sitting. Additional Orders / Instructions Follow Nutritious Diet and Increase Protein Intake Medications-Please add to medication list. P.O. Antibiotics Other: - TCA to periwound Wound Treatment Wound #1 - Lower Leg Wound Laterality: Left, Lateral Cleanser: Soap and Water 1 x Per Week/30 Days Discharge Instructions: Gently cleanse wound with antibacterial soap, rinse and pat dry prior to dressing wounds Peri-Wound Care: Triamcinolone Acetonide Cream, 0.1%, 15 (g) tube 1 x Per Week/30 Days Discharge Instructions: Apply as  directed. Prim Dressing: Cutimed Sorbact 1.5x 2.38 (in/in) 1 x Per Week/30 Days ary Discharge Instructions: A bacteria- and fungi binding wound dressing, suitable for cavities and fistulas. It is suitable as a wound filler and allows the passage  of wound exudate into a secondary dressing. The dressing helps reducing odor and pain and can improve healing. Secondary Dressing: ABD Pad 5x9 (in/in) 1 x Per Week/30 Days Discharge Instructions: Cover with ABD pad Compression Wrap: 3-LAYER WRAP - Profore Lite LF 3 Multilayer Compression Bandaging System 1 x Per Week/30 Days Discharge Instructions: Apply 3 multi-layer wrap as prescribed. If wrap slides more than 1 inch (mark on leg) call wound care for re-wrap. Laboratory Bacteria identified in Wound by Culture (MICRO) - left leg LOINC Code: 6945-0 Convenience Name: Wound culture routine Patient Medications llergies: erythromycin base, Sulfa (Sulfonamide Antibiotics), wheat A Notifications Medication Indication Start End 08/13/2022 doxycycline hyclate DOSE 1 - oral 100 mg capsule - 1 capsule oral twice a day x 14 days Electronic Signature([name]) Signed: 08/13/2022 5:43:57 PM By: Luberta Mutter, [name] ([id number]) [id number] Page 4 of 8 Entered By: Worthy Keeler on 08/13/2022 17:30:38 Prescription 08/13/2022 -------------------------------------------------------------------------------- Domenic Schwab PA-C Patient Name: Provider: 1926/05/25 [id number] Date of Birth: NPI#: [id number] Sex: DEA #: [phone] Phone #: License #: [employer] Patient Address: [place], [place] [address] [address], [place],  [address] [phone] Allergies erythromycin base; Sulfa (Sulfonamide Antibiotics); wheat Medication Medication: Route: Strength: Form: doxycycline hyclate oral 100 mg  capsule Class: PERIODONTAL COLLAGENASE INHIBITORS Dose: Frequency / Time: Indication: 1 1 capsule oral twice a day x 14 days Number of Refills: Number of Units: 0 Twenty Eight (28) Capsule([name]) Generic Substitution: Start Date: End Date: Administered at Facility: Substitution Permitted 10/18/4980 No Note to Pharmacy: Hand Signature: Date([name]): Electronic Signature([name]) Signed: 08/13/2022 5:43:57 PM By: Worthy Keeler PA-C Entered By: Worthy Keeler on 08/13/2022 17:30:39 -------------------------------------------------------------------------------- Problem List Details Patient Name: Date of Service: [name] Bufford Spikes, [name] 08/13/2022 2:15 PM Medical Record Number: [id number] Patient Account Number: [id number] Date of Birth/Sex: Treating RN: October 22, 1925 (89 y.o. [name] Primary Care Provider: Frazier Richards Other Clinician: Referring Provider: Treating Provider/Extender: [name] in Treatment: [place], [place] ([id number]) [id number] Page 5 of 8 Active Problems ICD-10 Encounter Code Description Active Date MDM Diagnosis I87.332 Chronic venous hypertension (idiopathic) with ulcer and inflammation of left 07/16/2022 No Yes lower extremity L97.822 Non-pressure chronic ulcer of other part of left lower leg with fat layer exposed12/21/2023 No Yes I10 Essential (primary) hypertension 07/16/2022 No Yes I48.0 Paroxysmal atrial fibrillation 07/16/2022 No Yes Z79.01 Long term (current) use of anticoagulants 07/16/2022 No Yes Inactive Problems Resolved Problems Electronic Signature([name]) Signed: 08/13/2022 2:13:56 PM By: Worthy Keeler PA-C Entered By: Worthy Keeler on 08/13/2022 14:13:56 -------------------------------------------------------------------------------- Progress Note Details Patient Name: Date of Service: [name], [name] 08/13/2022 2:15 PM Medical Record Number: [id number] Patient Account Number: [id number] Date  of Birth/Sex: Treating RN: 1926-03-25 (89 y.o. [name] Primary Care Provider: Frazier Richards Other Clinician: Referring Provider: Treating Provider/Extender: [name] in Treatment: 4 Subjective Chief Complaint Information obtained from Patient Left lateral LE ulcer History of Present Illness (HPI) 07-16-2022 upon evaluation today patient presents for initial inspection here in our clinic concerning a wound on the left lateral lower extremity. This is an area that has been present since around May 27, 2022 according to what the patient tells me today. With that being said she does live in assisted living facility. She is seen with her son in the office today. As far as past medical history is concerned she does have  a history of breast cancer in 2003 which she did obviously survive. She also had a right knee replacement. In 2009 she had an IVC filter and heart ablation due to atrial fibrillation. With regard to the remainder of her medical history again she is on anticoagulant therapy at this point due to atrial fibrillation, she has hypertension, and chronic venous insufficiency based on what I am seeing currently. In regard to the wound she is currently on Keflex which should be completed on the 25th of this month. Other than that she has had various things applied to the wound including different creams but then at other times she also states that she has had nothing put on and been told to just leave it open to air. There is been a lot of confusion about the best treatment course with regard to her wound which is obviously not good. I discussed with her today that I think we can have the best plan going forward and that that will include keeping this covered but nonetheless it should help it to heal the most effectively as well. [name], [name] ([id number]) [id number] Page 6 of 8 12/29; left lower leg chronic venous  insufficiency. She complains fairly bitterly about the tightness of the wrap. She was put on antibiotics last week which I believe is Keflex. She lives in an assisted living 07-31-2021 upon evaluation today patient appears to be doing well currently in regard to her wound. She is actually been tolerating the dressing changes this is measuring smaller and looking better. I think we are on the right track I [name] believe that the current compression wrap is helping significantly with her [place] at this point. Overall I [name] not see any signs of infection locally nor systemically which is great news. No fevers, chills, nausea, vomiting, or diarrhea. 08-13-2022 upon evaluation today patient'[name] wound on her leg actually showing signs of erythema around the edges of the wound that she is also still having quite a bit of drainage all things considered. Fortunately I [name] not see any evidence of infection systemically though locally I definitely see some evidence here both with erythema, warmth, and drainage. She has been using the silver alginate dressing we have also had an 3 layer compression wrap though she did not feel good felt very tight at the top of the wrap which is the main concern she has today. Objective Constitutional Well-nourished and well-hydrated in no acute distress. Vitals Time Taken: 2:15 PM, Weight: 106 lbs, Temperature: 97.6 F, Pulse: 80 bpm, Respiratory Rate: 16 breaths/min, Blood Pressure: 156/79 mmHg. Respiratory normal breathing without difficulty. Psychiatric this patient is able to make decisions and demonstrates good insight into disease process. Alert and Oriented x 3. pleasant and cooperative. General Notes: Upon inspection patient'[name] wound bed actually showed signs of good granulation epithelization at this point. Fortunately there does not appear to be any evidence of active infection at this time systemically though locally I definitely see some issues here. With increased drainage  and erythema and warmth I think that the patient would benefit from getting her on a different antibiotic I think the Keflex she has previously been on has not really been beneficial. Prior to that she was apparently also on Augmentin but this was back in October. The most recent prescription with Keflex was December 18 through 25. Integumentary (Hair, Skin) Wound #1 status is Open. Original cause of wound was Gradually Appeared. The date acquired was: 05/27/2022. The wound has been in treatment 4 weeks. The  wound is located on the Left,Lateral Lower Leg. The wound measures 7cm length x 3cm width x 0.1cm depth; 16.493cm^2 area and 1.649cm^3 volume. There is Fat Layer (Subcutaneous Tissue) exposed. There is no tunneling or undermining noted. There is a large amount of serous drainage noted. The wound margin is indistinct and nonvisible. There is small (1-33%) red, friable granulation within the wound bed. There is a large (67-100%) amount of necrotic tissue within the wound bed including Adherent Slough. Assessment Active Problems ICD-10 Chronic venous hypertension (idiopathic) with ulcer and inflammation of left lower extremity Non-pressure chronic ulcer of other part of left lower leg with fat layer exposed Essential (primary) hypertension Paroxysmal atrial fibrillation Long term (current) use of anticoagulants Procedures Wound #1 Pre-procedure diagnosis of Wound #1 is a Venous Leg Ulcer located on the Left,Lateral Lower Leg . There was a Three Layer Compression Therapy Procedure with a pre-treatment ABI of 1 by Cornell Barman, RN. Post procedure Diagnosis Wound #1: Same as Pre-Procedure Plan Follow-up Appointments: Wound #1 Left,Lateral Lower Leg: Return Appointment in 1 week. Nurse Visit as needed Bathing/ Shower/ Hygiene: May shower with wound dressing protected with water repellent cover or cast protector. [name], [name] ([id number]) [id number] Page 7 of  8 Anesthetic (Use 'Patient Medications' Section for Anesthetic Order Entry): Lidocaine applied to wound bed Edema Control - Lymphedema / Segmental Compressive Device / Other: Elevate, Exercise Daily and Avoid Standing for Long Periods of Time. Elevate legs to the level of the heart and pump ankles as often as possible Elevate leg([name]) parallel to the floor when sitting. Additional Orders / Instructions: Follow Nutritious Diet and Increase Protein Intake Medications-Please add to medication list.: P.O. Antibiotics Other: - TCA to periwound Laboratory ordered were: Wound culture routine - left leg The following medication([name]) was prescribed: doxycycline hyclate oral 100 mg capsule 1 1 capsule oral twice a day x 14 days starting 08/13/2022 WOUND #1: - Lower Leg Wound Laterality: Left, Lateral Cleanser: Soap and Water 1 x Per Week/30 Days Discharge Instructions: Gently cleanse wound with antibacterial soap, rinse and pat dry prior to dressing wounds Peri-Wound Care: Triamcinolone Acetonide Cream, 0.1%, 15 (g) tube 1 x Per Week/30 Days Discharge Instructions: Apply as directed. Prim Dressing: Cutimed Sorbact 1.5x 2.38 (in/in) 1 x Per Week/30 Days ary Discharge Instructions: A bacteria- and fungi binding wound dressing, suitable for cavities and fistulas. It is suitable as a wound filler and allows the passage of wound exudate into a secondary dressing. The dressing helps reducing odor and pain and can improve healing. Secondary Dressing: ABD Pad 5x9 (in/in) 1 x Per Week/30 Days Discharge Instructions: Cover with ABD pad Com pression Wrap: 3-LAYER WRAP - Profore Lite LF 3 Multilayer Compression Bandaging System 1 x Per Week/30 Days Discharge Instructions: Apply 3 multi-layer wrap as prescribed. If wrap slides more than 1 inch (mark on leg) call wound care for re-wrap. 1. Based on what I am seeing I am going to recommend that the patient go ahead and continue to utilize 3 layer compression wrap that  we will avoid any over the Intropaste at the top as this was causing it to be a little bit too tight. 2. Also can recommend that we have the patient continue with the triamcinolone cream to the leg which should help hopefully with some of the irritation. 3. I am also going to go ahead and start the patient today on a antibiotic, doxycycline, which I think should [name] well for her. This is the initial antibiotic choice at this  time although depending on the results of the culture we may make some adjustments in care going forward as necessary. The patient and her son voiced understanding. We will see patient back for reevaluation in 1 week here in the clinic. If anything worsens or changes patient will contact our office for additional recommendations. Electronic Signature([name]) Signed: 08/13/2022 5:31:05 PM By: Worthy Keeler PA-C Entered By: Worthy Keeler on 08/13/2022 17:31:04 -------------------------------------------------------------------------------- SuperBill Details Patient Name: Date of Service: [name], [name] 08/13/2022 Medical Record Number: [id number] Patient Account Number: [id number] Date of Birth/Sex: Treating RN: 04-28-1926 (89 y.o. [name], [name] Primary Care Provider: Frazier Richards Other Clinician: Referring Provider: Treating Provider/Extender: [name] in Treatment: 4 Diagnosis Coding ICD-10 Codes Code Description [phone] Chronic venous hypertension (idiopathic) with ulcer and inflammation of left lower extremity L97.822 Non-pressure chronic ulcer of other part of left lower leg with fat layer exposed I10 Essential (primary) hypertension I48.0 Paroxysmal atrial fibrillation Z79.01 Long term (current) use of anticoagulants Physician Procedures MAMTA, RIMMER ([id number]): CPT4 Code Description [id number] - WC PHYS LEVEL 4 - EST PT ICD-10 Diagnosis Description I87.332 Chronic venous hypertension (idiopathic) with ulcer and inflammati  L97.822 Non-pressure chronic ulcer of other part of left  lower leg with fa I10 Essential (primary) hypertension I48.0 Paroxysmal atrial fibrillation [id number] Page 8 of 8: Quantity Modifier 1 on of left lower extremity t layer exposed Electronic Signature([name]) Signed: 08/13/2022 5:31:26 PM By: Worthy Keeler PA-C Entered By: Worthy Keeler on 08/13/2022 17:31:26

## 2022-08-14 ENCOUNTER — Other Ambulatory Visit
Admission: RE | Admit: 2022-08-14 | Discharge: 2022-08-14 | Disposition: A | Discharge status: Home / Self Care | Payer: Medicare Other | Source: Ambulatory Visit | Attending: Internal Medicine | Admitting: Internal Medicine

## 2022-08-14 DIAGNOSIS — B999 Unspecified infectious disease: Secondary | ICD-10-CM | POA: Diagnosis present

## 2022-08-14 NOTE — Progress Notes (Signed)
LARKEN, URIAS ([id number]) [id number] Page 1 of 8 Visit Report for 08/13/2022 Arrival Information Details Patient Name: Date of Service: [name] Royals, DO RCA S 08/13/2022 2:15 PM Medical Record Number: [id number] Patient Account Number: [id number] Date of Birth/Sex: Treating RN: May 06, 1926 (89 y.o. [name] Primary Care Norberto Wishon: Frazier Richards Other Clinician: Referring Nesta Kimple: Treating Petrea Fredenburg/Extender: [name] in Treatment: 4 Visit Information History Since Last Visit Added or deleted any medications: No Patient Arrived: Wheel Chair Has Dressing in Place as Prescribed: Yes Arrival Time: 14:13 Has Compression in Place as Prescribed: Yes Accompanied By: son Pain Present Now: No Transfer Assistance: Manual Patient Identification Verified: Yes Secondary Verification Process Completed: Yes Patient Has Alerts: Yes Patient Alerts: Patient on Blood Thinner NOT DIABETIC Eliquis Electronic Signature(s) Signed: 08/13/2022 6:11:46 PM By: Gretta Cool, BSN, RN, CWS, [name] RN, BSN Entered By: Gretta Cool, BSN, RN, CWS, [name] on 08/13/2022 14:13:21 -------------------------------------------------------------------------------- Compression Therapy Details Patient Name: Date of Service: [name], DO RCA S 08/13/2022 2:15 PM Medical Record Number: [id number] Patient Account Number: [id number] Date of Birth/Sex: Treating RN: 1926/02/03 (89 y.o. [name] Primary Care Taijuan Serviss: Frazier Richards Other Clinician: Referring Stacia Feazell: Treating Lizbeth Feijoo/Extender: [name] in Treatment: 4 Compression Therapy Performed for Wound Assessment: Wound #1 Left,Lateral Lower Leg Performed By: Clinician Cornell Barman, RN Compression Type: Three Layer Pre Treatment ABI: 1 Post Procedure Diagnosis Same as Pre-procedure Electronic Signature(s) Signed: 08/13/2022 6:11:46 PM By: Gretta Cool, BSN, RN, CWS, [name] RN, BSN Entered By: Gretta Cool, BSN,  RN, CWS, [name] on 08/13/2022 15:10:20 [name] ([id number]) [id number] Page 2 of 8 -------------------------------------------------------------------------------- Lower Extremity Assessment Details Patient Name: Date of Service: [name] Royals, DO RCA S 08/13/2022 2:15 PM Medical Record Number: [id number] Patient Account Number: [id number] Date of Birth/Sex: Treating RN: November 03, 1925 (89 y.o. [name] Primary Care Zelina Jimerson: Frazier Richards Other Clinician: Referring Gonsalo Cuthbertson: Treating Saraann Enneking/Extender: [name] in Treatment: 4 Edema Assessment Assessed: [Left: Yes] [Right: No] Edema: [Left: Ye] [Right: s] Calf Left: Right: Point of Measurement: 30 cm From Medial Instep 29.5 cm Ankle Left: Right: Point of Measurement: 12 cm From Medial Instep 19 cm Vascular Assessment Pulses: Dorsalis Pedis Palpable: [Left:Yes] Electronic Signature(s) Signed: 08/13/2022 6:11:46 PM By: Gretta Cool, BSN, RN, CWS, [name] RN, BSN Entered By: Gretta Cool, BSN, RN, CWS, [name] on 08/13/2022 14:27:31 -------------------------------------------------------------------------------- Multi Wound Chart Details Patient Name: Date of Service: [name] Royals, DO RCA S 08/13/2022 2:15 PM Medical Record Number: [id number] Patient Account Number: [id number] Date of Birth/Sex: Treating RN: September 15, 1925 (89 y.o. [name] Primary Care Aniella Wandrey: Frazier Richards Other Clinician: Referring Chimene Salo: Treating Taryn Shellhammer/Extender: [name] in Treatment: 4 Vital Signs Height(in): Pulse(bpm): 80 Weight(lbs): 106 Blood Pressure(mmHg): 156/79 Body Mass Index(BMI): LERIN, JECH ([id number]) [id number] Page 3 of 8 Temperature(F): 97.6 Respiratory Rate(breaths/min): 16 [1:Photos:] [N/A:N/A] Left, Lateral Lower Leg N/A N/A Wound Location: Gradually Appeared N/A N/A Wounding Event: Venous Leg Ulcer N/A N/A Primary  Etiology: Hypertension, Dementia, Received N/A N/A Comorbid History: Chemotherapy 05/27/2022 N/A N/A Date Acquired: 4 N/A N/A Weeks of Treatment: Open N/A N/A Wound Status: No N/A N/A Wound Recurrence: 7x3x0.1 N/A N/A Measurements L x W x D (cm) 16.493 N/A N/A A (cm) : rea 1.649 N/A N/A Volume (cm) : 37.30% N/A N/A % Reduction in Area: 68.60% N/A N/A % Reduction in Volume: Full Thickness Without Exposed N/A N/A Classification: Support Structures Large N/A N/A Exudate Amount: Serous N/A N/A Exudate Type: amber N/A N/A Exudate Color: Indistinct, nonvisible N/A N/A Wound  Margin: Small (1-33%) N/A N/A Granulation Amount: Red, Friable N/A N/A Granulation Quality: Large (67-100%) N/A N/A Necrotic Amount: Fat Layer (Subcutaneous Tissue): Yes N/A N/A Exposed Structures: Fascia: No Tendon: No Muscle: No Joint: No Bone: No None N/A N/A Epithelialization: Treatment Notes Electronic Signature(s) Signed: 08/13/2022 6:11:46 PM By: Gretta Cool, BSN, RN, CWS, [name] RN, BSN Entered By: Gretta Cool, BSN, RN, CWS, [name] on 08/13/2022 15:06:37 -------------------------------------------------------------------------------- Multi-Disciplinary Care Plan Details Patient Name: Date of Service: [name] Royals, DO RCA S 08/13/2022 2:15 PM Medical Record Number: [id number] Patient Account Number: [id number] Date of Birth/Sex: Treating RN: 1925-12-23 (89 y.o. [name] Primary Care Avier Jech: Frazier Richards Other Clinician: Referring Kierre Hintz: Treating Rubena Roseman/Extender: [name] in Treatment: [address] [place], [name] ([id number]) [id number] Page 4 of 8 Pain, Acute or Chronic Nursing Diagnoses: Pain Management - Non-cyclic Acute (Procedural) Potential alteration in comfort, pain Goals: Patient will verbalize adequate pain control and receive pain control interventions during procedures as needed Date Initiated: 07/16/2022 Target  Resolution Date: 08/13/2022 Goal Status: Active Patient/caregiver will verbalize adequate pain control between visits Date Initiated: 07/16/2022 Target Resolution Date: 08/13/2022 Goal Status: Active Patient/caregiver will verbalize comfort level met Date Initiated: 07/16/2022 Target Resolution Date: 08/13/2022 Goal Status: Active Interventions: Reposition patient for comfort Treatment Activities: Administer pain control measures as ordered : 07/16/2022 Notes: Venous Leg Ulcer Nursing Diagnoses: Knowledge deficit related to disease process and management Potential for venous Insuffiency (use before diagnosis confirmed) Goals: Patient will maintain optimal edema control Date Initiated: 07/16/2022 Target Resolution Date: 08/13/2022 Goal Status: Active Patient/caregiver will verbalize understanding of disease process and disease management Date Initiated: 07/16/2022 Target Resolution Date: 08/13/2022 Goal Status: Active Interventions: Assess peripheral edema status every visit. Compression as ordered Notes: Wound/Skin Impairment Nursing Diagnoses: Impaired tissue integrity Knowledge deficit related to smoking impact on wound healing Knowledge deficit related to ulceration/compromised skin integrity Goals: Patient/caregiver will verbalize understanding of skin care regimen Date Initiated: 07/16/2022 Target Resolution Date: 08/13/2022 Goal Status: Active Ulcer/skin breakdown will have a volume reduction of 30% by week 4 Date Initiated: 07/16/2022 Date Inactivated: 08/13/2022 Target Resolution Date: 08/13/2022 Goal Status: Met Ulcer/skin breakdown will have a volume reduction of 50% by week 8 Date Initiated: 08/13/2022 Target Resolution Date: 09/10/2022 Goal Status: Active Interventions: Assess patient/caregiver ability to obtain necessary supplies Assess patient/caregiver ability to perform ulcer/skin care regimen upon admission and as needed Assess ulceration(s) every  visit Provide education on ulcer and skin care Treatment Activities: Skin care regimen initiated : 07/16/2022 Topical wound management initiated : 07/16/2022 Notes: Electronic Signature(s) BETTIE, CAPISTRAN ([id number]) [id number] Page 5 of 8 Signed: 08/13/2022 5:40:52 PM By: Gretta Cool, BSN, RN, CWS, [name] RN, BSN Entered By: Gretta Cool, BSN, RN, CWS, [name] on 08/13/2022 17:40:52 -------------------------------------------------------------------------------- Pain Assessment Details Patient Name: Date of Service: [name], DO RCA S 08/13/2022 2:15 PM Medical Record Number: [id number] Patient Account Number: [id number] Date of Birth/Sex: Treating RN: [birth year] (89 y.o. [name] Primary Care Lititia Sen: Frazier Richards Other Clinician: Referring Jaziel Bennett: Treating Jazara Swiney/Extender: [name] in Treatment: 4 Active Problems Location of Pain Severity and Description of Pain Patient Has Paino No Site Locations Pain Management and Medication Current Pain Management: Electronic Signature(s) Signed: 08/13/2022 6:11:46 PM By: Gretta Cool, BSN, RN, CWS, [name] RN, BSN Entered By: Gretta Cool, BSN, RN, CWS, [name] on 08/13/2022 14:15:44 -------------------------------------------------------------------------------- Patient/Caregiver Education Details Patient Name: Date of Service: [name] Royals, DO RCA S 1/18/2024andnbsp2:15 PM Medical Record Number: [id number] Patient Account Number: [id number] ZAYLIE, GISLER ([id number]) [id number] Page 6 of 8  Date of Birth/Gender: Treating RN: [birth year] (89 y.o. [name] Primary Care Physician: Frazier Richards Other Clinician: Referring Physician: Treating Physician/Extender: [name] in Treatment: 4 Education Assessment Education Provided To: Patient Education Topics Provided Venous: Handouts: Controlling Swelling with Multilayered Compression Wraps Methods:  Explain/Verbal Responses: State content correctly Wound/Skin Impairment: Handouts: Caring for Your Ulcer Methods: Demonstration, Explain/Verbal Responses: State content correctly Electronic Signature(s) Signed: 08/13/2022 6:11:46 PM By: Gretta Cool, BSN, RN, CWS, [name] RN, BSN Entered By: Gretta Cool, BSN, RN, CWS, [name] on 08/13/2022 17:38:21 -------------------------------------------------------------------------------- Wound Assessment Details Patient Name: Date of Service: [name], DO RCA S 08/13/2022 2:15 PM Medical Record Number: [id number] Patient Account Number: [id number] Date of Birth/Sex: Treating RN: [birth year] (89 y.o. [name] Primary Care Kathan Kirker: Frazier Richards Other Clinician: Referring Dquan Cortopassi: Treating Psalms Olarte/Extender: [name] in Treatment: 4 Wound Status Wound Number: 1 Primary Etiology: Venous Leg Ulcer Wound Location: Left, Lateral Lower Leg Wound Status: Open Wounding Event: Gradually Appeared Comorbid History: Hypertension, Dementia, Received Chemotherapy Date Acquired: 05/27/2022 Weeks Of Treatment: 4 Clustered Wound: No Photos Wound Measurements Milby, Nykayla ([id number]) Length: (cm) 7 Width: (cm) 3 Depth: (cm) 0.1 Area: (cm) 16.493 Volume: (cm) 1.649 [id number] Page 7 of 8 % Reduction in Area: 37.3% % Reduction in Volume: 68.6% Epithelialization: None Tunneling: No Undermining: No Wound Description Classification: Full Thickness Without Exposed Support Structures Wound Margin: Indistinct, nonvisible Exudate Amount: Large Exudate Type: Serous Exudate Color: amber Foul Odor After Cleansing: No Slough/Fibrino Yes Wound Bed Granulation Amount: Small (1-33%) Exposed Structure Granulation Quality: Red, Friable Fascia Exposed: No Necrotic Amount: Large (67-100%) Fat Layer (Subcutaneous Tissue) Exposed: Yes Necrotic Quality: Adherent Slough Tendon Exposed: No Muscle Exposed: No Joint  Exposed: No Bone Exposed: No Treatment Notes Wound #1 (Lower Leg) Wound Laterality: Left, Lateral Cleanser Soap and Water Discharge Instruction: Gently cleanse wound with antibacterial soap, rinse and pat dry prior to dressing wounds Peri-Wound Care Triamcinolone Acetonide Cream, 0.1%, 15 (g) tube Discharge Instruction: Apply as directed. Topical Primary Dressing Cutimed Sorbact 1.5x 2.38 (in/in) Discharge Instruction: A bacteria- and fungi binding wound dressing, suitable for cavities and fistulas. It is suitable as a wound filler and allows the passage of wound exudate into a secondary dressing. The dressing helps reducing odor and pain and can improve healing. Secondary Dressing ABD Pad 5x9 (in/in) Discharge Instruction: Cover with ABD pad Secured With Compression Wrap 3-LAYER WRAP - Profore Lite LF 3 Multilayer Compression Bandaging System Discharge Instruction: Apply 3 multi-layer wrap as prescribed. If wrap slides more than 1 inch (mark on leg) call wound care for re-wrap. Compression Stockings Environmental education officer) Signed: 08/13/2022 6:11:46 PM By: Gretta Cool, BSN, RN, CWS, [name] RN, BSN Entered By: Gretta Cool, BSN, RN, CWS, [name] on 08/13/2022 14:25:29 -------------------------------------------------------------------------------- Vitals Details Patient Name: Date of Service: [name], DO RCA S 08/13/2022 2:15 PM [name] ([id number]) [id number] Page 8 of 8 Medical Record Number: [id number] Patient Account Number: [id number] Date of Birth/Sex: Treating RN: 1925/09/10 (89 y.o. [name], [name] Primary Care Raymond Azure: Frazier Richards Other Clinician: Referring Jamilynn Whitacre: Treating Caterra Ostroff/Extender: [name] in Treatment: 4 Vital Signs Time Taken: 14:15 Temperature (F): 97.6 Weight (lbs): 106 Pulse (bpm): 80 Respiratory Rate (breaths/min): 16 Blood Pressure (mmHg): 156/79 Reference Range: 80 - 120 mg /  dl Electronic Signature(s) Signed: 08/13/2022 6:11:46 PM By: Gretta Cool, BSN, RN, CWS, [name] RN, BSN Entered By: Gretta Cool, BSN, RN, CWS, [name] on 08/13/2022 14:15:39

## 2022-08-16 LAB — AEROBIC CULTURE W GRAM STAIN (SUPERFICIAL SPECIMEN): Gram Stain: NONE SEEN

## 2022-08-20 ENCOUNTER — Encounter: Payer: TRICARE For Life (TFL) | Admitting: Internal Medicine

## 2022-08-20 DIAGNOSIS — I87332 Chronic venous hypertension (idiopathic) with ulcer and inflammation of left lower extremity: Secondary | ICD-10-CM | POA: Diagnosis not present

## 2022-08-20 NOTE — Progress Notes (Signed)
[name] ([id number]) [id number] Page 1 of 10 Visit Report for 08/20/2022 Arrival Information Details Patient Name: Date of Service: [name] Royals, DO RCA S 08/20/2022 1:15 PM Medical Record Number: [id number] Patient Account Number: [id number] Date of Birth/Sex: Treating RN: Dec 06, 1925 (89 y.o. [name] Primary Care Nissa Stannard: Frazier Richards Other Clinician: Referring Jahseh Lucchese: Treating Jakub Debold/Extender: Eldridge Dace, MICHA EL Donne Anon in Treatment: 5 Visit Information History Since Last Visit Added or deleted any medications: No Patient Arrived: Wheel Chair Any new allergies or adverse reactions: No Arrival Time: 13:51 Had a fall or experienced change in No Accompanied By: son activities of daily living that may affect Transfer Assistance: None risk of falls: Patient Identification Verified: Yes Hospitalized since last visit: No Secondary Verification Process Completed: Yes Has Dressing in Place as Prescribed: Yes Patient Requires Transmission-Based Precautions: No Has Compression in Place as Prescribed: Yes Patient Has Alerts: Yes Pain Present Now: No Patient Alerts: Patient on Blood Thinner NOT DIABETIC Eliquis Electronic Signature(s) Signed: 08/20/2022 3:58:41 PM By: Rosalio Loud MSN RN CNS WTA Entered By: Rosalio Loud on 08/20/2022 15:58:40 -------------------------------------------------------------------------------- Clinic Level of Care Assessment Details Patient Name: Date of Service: [name] MSEY, DO RCA S 08/20/2022 1:15 PM Medical Record Number: [id number] Patient Account Number: [id number] Date of Birth/Sex: Treating RN: Jun 22, 1926 (89 y.o. [name] Primary Care Earmon Sherrow: Frazier Richards Other Clinician: Referring Yanira Tolsma: Treating Debra Colon/Extender: RO BSO N, [place] EL Donne Anon in Treatment: 5 Clinic Level of Care Assessment Items TOOL 4 Quantity Score X- 1 0 Use when only an EandM is  performed on FOLLOW-UP visit ASSESSMENTS - Nursing Assessment / Reassessment X- 1 10 Reassessment of Co-morbidities (includes updates in patient status) X- 1 5 Reassessment of Adherence to Treatment Plan ASSESSMENTS - Wound and Skin A ssessment / Reassessment X - Simple Wound Assessment / Reassessment - one wound 1 5 [name], [name] ([id number]) [id number] Page 2 of 10 '[]'$  - 0 Complex Wound Assessment / Reassessment - multiple wounds '[]'$  - 0 Dermatologic / Skin Assessment (not related to wound area) ASSESSMENTS - Focused Assessment '[]'$  - 0 Circumferential Edema Measurements - multi extremities '[]'$  - 0 Nutritional Assessment / Counseling / Intervention '[]'$  - 0 Lower Extremity Assessment (monofilament, tuning fork, pulses) '[]'$  - 0 Peripheral Arterial Disease Assessment (using hand held doppler) ASSESSMENTS - Ostomy and/or Continence Assessment and Care '[]'$  - 0 Incontinence Assessment and Management '[]'$  - 0 Ostomy Care Assessment and Management (repouching, etc.) PROCESS - Coordination of Care X - Simple Patient / Family Education for ongoing care 1 15 '[]'$  - 0 Complex (extensive) Patient / Family Education for ongoing care X- 1 10 Staff obtains Programmer, systems, Records, T Results / Process Orders est '[]'$  - 0 Staff telephones HHA, Nursing Homes / Clarify orders / etc '[]'$  - 0 Routine Transfer to another Facility (non-emergent condition) '[]'$  - 0 Routine Hospital Admission (non-emergent condition) '[]'$  - 0 New Admissions / Biomedical engineer / Ordering NPWT Apligraf, etc. , '[]'$  - 0 Emergency Hospital Admission (emergent condition) X- 1 10 Simple Discharge Coordination '[]'$  - 0 Complex (extensive) Discharge Coordination PROCESS - Special Needs '[]'$  - 0 Pediatric / Minor Patient Management '[]'$  - 0 Isolation Patient Management '[]'$  - 0 Hearing / Language / Visual special needs '[]'$  - 0 Assessment of Community assistance (transportation, D/C planning, etc.) '[]'$  -  0 Additional assistance / Altered mentation '[]'$  - 0 Support Surface(s) Assessment (bed, cushion, seat, etc.) INTERVENTIONS - Wound Cleansing / Measurement X - Simple Wound Cleansing -  one wound 1 5 '[]'$  - 0 Complex Wound Cleansing - multiple wounds X- 1 5 Wound Imaging (photographs - any number of wounds) '[]'$  - 0 Wound Tracing (instead of photographs) X- 1 5 Simple Wound Measurement - one wound '[]'$  - 0 Complex Wound Measurement - multiple wounds INTERVENTIONS - Wound Dressings X - Small Wound Dressing one or multiple wounds 1 10 '[]'$  - 0 Medium Wound Dressing one or multiple wounds '[]'$  - 0 Large Wound Dressing one or multiple wounds '[]'$  - 0 Application of Medications - topical '[]'$  - 0 Application of Medications - injection INTERVENTIONS - Miscellaneous '[]'$  - 0 External ear exam '[]'$  - 0 Specimen Collection (cultures, biopsies, blood, body fluids, etc.) [name], [name] ([id number]) [id number] Page 3 of 10 '[]'$  - 0 Specimen(s) / Culture(s) sent or taken to Lab for analysis '[]'$  - 0 Patient Transfer (multiple staff / Harrel Lemon Lift / Similar devices) '[]'$  - 0 Simple Staple / Suture removal (25 or less) '[]'$  - 0 Complex Staple / Suture removal (26 or more) '[]'$  - 0 Hypo / Hyperglycemic Management (close monitor of Blood Glucose) '[]'$  - 0 Ankle / Brachial Index (ABI) - do not check if billed separately X- 1 5 Vital Signs Has the patient been seen at the hospital within the last three years: Yes Total Score: 85 Level Of Care: New/Established - Level 3 Electronic Signature(s) Signed: 08/20/2022 4:58:20 PM By: Rosalio Loud MSN RN CNS WTA Entered By: Rosalio Loud on 08/20/2022 16:04:17 -------------------------------------------------------------------------------- Encounter Discharge Information Details Patient Name: Date of Service: [name] Royals, DO RCA S 08/20/2022 1:15 PM Medical Record Number: [id number] Patient Account Number: [id number] Date of Birth/Sex: Treating  RN: [birth year] (89 y.o. [name] Primary Care Dniya Neuhaus: Frazier Richards Other Clinician: Referring Harvest Deist: Treating Tyneshia Stivers/Extender: RO BSO N, MICHA EL Donne Anon in Treatment: 5 Encounter Discharge Information Items Discharge Condition: Stable Ambulatory Status: Wheelchair Discharge Destination: [place] Telephoned: No Accompanied By: son Schedule Follow-up Appointment: Yes Clinical Summary of Care: Electronic Signature(s) Signed: 08/20/2022 4:05:32 PM By: Rosalio Loud MSN RN CNS WTA Entered By: Rosalio Loud on 08/20/2022 16:05:32 -------------------------------------------------------------------------------- Lower Extremity Assessment Details Patient Name: Date of Service: [name] MSEY, DO RCA S 08/20/2022 1:15 PM Medical Record Number: [id number] Patient Account Number: [id number] Date of Birth/Sex: Treating RN: 1926-05-10 (89 y.o. [name] Harrison, Gillett ([id number]) [id number] Page 4 of 10 Primary Care Edan Juday: Frazier Richards Other Clinician: Referring Kariya Lavergne: Treating Sianna Garofano/Extender: RO BSO N, MICHA EL Marlou Starks Weeks in Treatment: 5 Edema Assessment Assessed: [Left: Yes] [Right: No] [Left: Edema] [Right: :] Calf Left: Right: Point of Measurement: 30 cm From Medial Instep 28 cm Ankle Left: Right: Point of Measurement: 12 cm From Medial Instep 19 cm Vascular Assessment Pulses: Dorsalis Pedis Palpable: [Left:Yes] Electronic Signature(s) Signed: 08/20/2022 4:02:33 PM By: Rosalio Loud MSN RN CNS WTA Entered By: Rosalio Loud on 08/20/2022 16:02:33 -------------------------------------------------------------------------------- Multi Wound Chart Details Patient Name: Date of Service: [name] Royals, DO RCA S 08/20/2022 1:15 PM Medical Record Number: [id number] Patient Account Number: [id number] Date of Birth/Sex: Treating RN: November 19, 1925 (89 y.o. [name] Primary Care Korby Ratay:  Frazier Richards Other Clinician: Referring Cristin Penaflor: Treating Alexandar Weisenberger/Extender: RO BSO N, MICHA EL Donne Anon in Treatment: 5 Vital Signs Height(in): Pulse(bpm): 63 Weight(lbs): 106 Blood Pressure(mmHg): 142/63 Body Mass Index(BMI): Temperature(F): 97.5 Respiratory Rate(breaths/min): 16 [1:Photos:] [N/A:N/A] Left, Lateral Lower Leg N/A N/A Wound Location: Gradually Appeared N/A N/A Wounding Event: Venous Leg Ulcer N/A N/A Primary Etiology: Hypertension, Dementia, Received N/A  N/A Comorbid History: Chemotherapy DEJIA, EBRON ([id number]) [id number] Page 5 of 10 05/27/2022 N/A N/A Date Acquired: 5 N/A N/A Weeks of Treatment: Open N/A N/A Wound Status: No N/A N/A Wound Recurrence: 3.5x1x0.2 N/A N/A Measurements L x W x D (cm) 2.749 N/A N/A A (cm) : rea 0.55 N/A N/A Volume (cm) : 89.50% N/A N/A % Reduction in Area: 89.50% N/A N/A % Reduction in Volume: Full Thickness Without Exposed N/A N/A Classification: Support Structures Large N/A N/A Exudate Amount: Serous N/A N/A Exudate Type: amber N/A N/A Exudate Color: Indistinct, nonvisible N/A N/A Wound Margin: Small (1-33%) N/A N/A Granulation Amount: Red, Friable N/A N/A Granulation Quality: Large (67-100%) N/A N/A Necrotic Amount: Fat Layer (Subcutaneous Tissue): Yes N/A N/A Exposed Structures: Fascia: No Tendon: No Muscle: No Joint: No Bone: No None N/A N/A Epithelialization: Treatment Notes Wound #1 (Lower Leg) Wound Laterality: Left, Lateral Cleanser Soap and Water Discharge Instruction: Gently cleanse wound with antibacterial soap, rinse and pat dry prior to dressing wounds Peri-Wound Care Triamcinolone Acetonide Cream, 0.1%, 15 (g) tube Discharge Instruction: Apply as directed. Topical Primary Dressing Cutimed Sorbact 1.5x 2.38 (in/in) Discharge Instruction: A bacteria- and fungi binding wound dressing, suitable for cavities and fistulas.  It is suitable as a wound filler and allows the passage of wound exudate into a secondary dressing. The dressing helps reducing odor and pain and can improve healing. Secondary Dressing ABD Pad 5x9 (in/in) Discharge Instruction: Cover with ABD pad Secured With Compression Wrap 3-LAYER WRAP - Profore Lite LF 3 Multilayer Compression Bandaging System Discharge Instruction: Apply 3 multi-layer wrap as prescribed. If wrap slides more than 1 inch (mark on leg) call wound care for re-wrap. Compression Stockings Add-Ons Electronic Signature(s) Signed: 08/20/2022 4:02:40 PM By: Rosalio Loud MSN RN CNS WTA Entered By: Rosalio Loud on 08/20/2022 16:02:40 [name] ([id number]) [id number] Page 6 of 10 -------------------------------------------------------------------------------- Multi-Disciplinary Care Plan Details Patient Name: Date of Service: [name] Royals, DO RCA S 08/20/2022 1:15 PM Medical Record Number: [id number] Patient Account Number: [id number] Date of Birth/Sex: Treating RN: Jun 05, 1926 (89 y.o. [name] Primary Care Basma Buchner: Frazier Richards Other Clinician: Referring Tashawn Laswell: Treating Jyaire Koudelka/Extender: Eldridge Dace, MICHA EL Donne Anon in Treatment: 5 Active Inactive Pain, Acute or Chronic Nursing Diagnoses: Pain Management - Non-cyclic Acute (Procedural) Potential alteration in comfort, pain Goals: Patient will verbalize adequate pain control and receive pain control interventions during procedures as needed Date Initiated: 07/16/2022 Target Resolution Date: 08/13/2022 Goal Status: Active Patient/caregiver will verbalize adequate pain control between visits Date Initiated: 07/16/2022 Target Resolution Date: 08/13/2022 Goal Status: Active Patient/caregiver will verbalize comfort level met Date Initiated: 07/16/2022 Target Resolution Date: 08/13/2022 Goal Status: Active Interventions: Reposition patient for comfort Treatment  Activities: Administer pain control measures as ordered : 07/16/2022 Notes: Venous Leg Ulcer Nursing Diagnoses: Knowledge deficit related to disease process and management Potential for venous Insuffiency (use before diagnosis confirmed) Goals: Patient will maintain optimal edema control Date Initiated: 07/16/2022 Target Resolution Date: 08/13/2022 Goal Status: Active Patient/caregiver will verbalize understanding of disease process and disease management Date Initiated: 07/16/2022 Target Resolution Date: 08/13/2022 Goal Status: Active Interventions: Assess peripheral edema status every visit. Compression as ordered Notes: Wound/Skin Impairment Nursing Diagnoses: Impaired tissue integrity Knowledge deficit related to smoking impact on wound healing Knowledge deficit related to ulceration/compromised skin integrity Goals: Patient/caregiver will verbalize understanding of skin care regimen Date Initiated: 07/16/2022 Target Resolution Date: 08/13/2022 Goal Status: Active Ulcer/skin breakdown will have a volume reduction of 30% by week 4 Date Initiated: 07/16/2022  Date Inactivated: 08/13/2022 Target Resolution Date: 08/13/2022 Goal Status: Met Ulcer/skin breakdown will have a volume reduction of 50% by week 8 Date Initiated: 08/13/2022 Target Resolution Date: 09/10/2022 Goal Status: Active Interventions: [name], [name] ([id number]) [id number] Page 7 of 10 Assess patient/caregiver ability to obtain necessary supplies Assess patient/caregiver ability to perform ulcer/skin care regimen upon admission and as needed Assess ulceration(s) every visit Provide education on ulcer and skin care Treatment Activities: Skin care regimen initiated : 07/16/2022 Topical wound management initiated : 07/16/2022 Notes: Electronic Signature(s) Signed: 08/20/2022 4:04:56 PM By: Rosalio Loud MSN RN CNS WTA Entered By: Rosalio Loud on 08/20/2022  16:04:55 -------------------------------------------------------------------------------- Pain Assessment Details Patient Name: Date of Service: [name] Bufford Spikes, DO RCA S 08/20/2022 1:15 PM Medical Record Number: [id number] Patient Account Number: [id number] Date of Birth/Sex: Treating RN: [birth year] (89 y.o. [name] Primary Care Taviana Westergren: Frazier Richards Other Clinician: Referring Greysyn Vanderberg: Treating Shekela Goodridge/Extender: Eldridge Dace, MICHA EL Donne Anon in Treatment: 5 Active Problems Location of Pain Severity and Description of Pain Patient Has Paino No Site Locations Pain Management and Medication Current Pain Management: Electronic Signature(s) Signed: 08/20/2022 4:58:20 PM By: Rosalio Loud MSN RN CNS WTA Entered By: Rosalio Loud on 08/20/2022 13:58:41 [name], [name] ([id number]) [id number] Page 8 of 10 -------------------------------------------------------------------------------- Patient/Caregiver Education Details Patient Name: Date of Service: [name] Royals, DO RCA S 1/25/2024andnbsp1:15 PM Medical Record Number: [id number] Patient Account Number: [id number] Date of Birth/Gender: Treating RN: 20-Feb-1926 (89 y.o. [name] Primary Care Physician: Frazier Richards Other Clinician: Referring Physician: Treating Physician/Extender: RO BSO N, [place] EL Donne Anon in Treatment: 5 Education Assessment Education Provided To: Patient Education Topics Provided Wound/Skin Impairment: Handouts: Caring for Your Ulcer Methods: Explain/Verbal Responses: State content correctly Electronic Signature(s) Signed: 08/20/2022 4:58:20 PM By: Rosalio Loud MSN RN CNS WTA Entered By: Rosalio Loud on 08/20/2022 16:04:41 -------------------------------------------------------------------------------- Wound Assessment Details Patient Name: Date of Service: [name] Bufford Spikes, DO RCA S 08/20/2022 1:15 PM Medical Record Number: [id number] Patient  Account Number: [id number] Date of Birth/Sex: Treating RN: April 18, 1926 (89 y.o. [name] Primary Care Colin Norment: Frazier Richards Other Clinician: Referring Cong Hightower: Treating Kyrstin Campillo/Extender: RO BSO N, MICHA EL Marlou Starks Weeks in Treatment: 5 Wound Status Wound Number: 1 Primary Etiology: Venous Leg Ulcer Wound Location: Left, Lateral Lower Leg Wound Status: Open Wounding Event: Gradually Appeared Comorbid History: Hypertension, Dementia, Received Chemotherapy Date Acquired: 05/27/2022 Weeks Of Treatment: 5 Clustered Wound: No Photos [name], [name] ([id number]) [id number] Page 9 of 10 Wound Measurements Length: (cm) 3.5 Width: (cm) 1 Depth: (cm) 0.2 Area: (cm) 2.749 Volume: (cm) 0.55 % Reduction in Area: 89.5% % Reduction in Volume: 89.5% Epithelialization: None Wound Description Classification: Full Thickness Without Exposed Support Structures Wound Margin: Indistinct, nonvisible Exudate Amount: Large Exudate Type: Serous Exudate Color: amber Foul Odor After Cleansing: No Slough/Fibrino Yes Wound Bed Granulation Amount: Small (1-33%) Exposed Structure Granulation Quality: Red, Friable Fascia Exposed: No Necrotic Amount: Large (67-100%) Fat Layer (Subcutaneous Tissue) Exposed: Yes Necrotic Quality: Adherent Slough Tendon Exposed: No Muscle Exposed: No Joint Exposed: No Bone Exposed: No Treatment Notes Wound #1 (Lower Leg) Wound Laterality: Left, Lateral Cleanser Soap and Water Discharge Instruction: Gently cleanse wound with antibacterial soap, rinse and pat dry prior to dressing wounds Peri-Wound Care Triamcinolone Acetonide Cream, 0.1%, 15 (g) tube Discharge Instruction: Apply as directed. Topical Primary Dressing Cutimed Sorbact 1.5x 2.38 (in/in) Discharge Instruction: A bacteria- and fungi binding wound dressing, suitable for cavities and fistulas. It is suitable as a wound filler and allows  the passage of  wound exudate into a secondary dressing. The dressing helps reducing odor and pain and can improve healing. Secondary Dressing ABD Pad 5x9 (in/in) Discharge Instruction: Cover with ABD pad Secured With Compression Wrap 3-LAYER WRAP - Profore Lite LF 3 Multilayer Compression Bandaging System Discharge Instruction: Apply 3 multi-layer wrap as prescribed. If wrap slides more than 1 inch (mark on leg) call wound care for re-wrap. Compression Stockings Add-Ons Electronic Signature(s) Signed: 08/20/2022 4:58:20 PM By: Rosalio Loud MSN RN CNS WTA Entered By: Rosalio Loud on 08/20/2022 14:08:26 [name] ([id number]) [id number] Page 10 of 10 -------------------------------------------------------------------------------- Vitals Details Patient Name: Date of Service: [name] Royals, DO RCA S 08/20/2022 1:15 PM Medical Record Number: [id number] Patient Account Number: [id number] Date of Birth/Sex: Treating RN: August 07, 1925 (89 y.o. [name] Primary Care Oriah Leinweber: Frazier Richards Other Clinician: Referring Abbie Jablon: Treating Analleli Gierke/Extender: RO BSO N, MICHA EL Donne Anon in Treatment: 5 Vital Signs Time Taken: 01:56 Temperature (F): 97.5 Weight (lbs): 106 Pulse (bpm): 67 Respiratory Rate (breaths/min): 16 Blood Pressure (mmHg): 142/63 Reference Range: 80 - 120 mg / dl Electronic Signature(s) Signed: 08/20/2022 4:02:14 PM By: Rosalio Loud MSN RN CNS WTA Entered By: Rosalio Loud on 08/20/2022 16:02:14

## 2022-08-20 NOTE — Progress Notes (Signed)
KAMY, POINSETT ([id number]) [id number] Page 1 of 6 Visit Report for 08/20/2022 HPI Details Patient Name: Date of Service: [name] Royals, DO RCA S 08/20/2022 1:15 PM Medical Record Number: [id number] Patient Account Number: [id number] Date of Birth/Sex: Treating RN: 06-24-1926 (89 y.o. [name] Primary Care Provider: Frazier Richards Other Clinician: Referring Provider: Treating Provider/Extender: Eldridge Dace, [place] EL Donne Anon in Treatment: 5 History of Present Illness HPI Description: 07-16-2022 upon evaluation today patient presents for initial inspection here in our clinic concerning a wound on the left lateral lower extremity. This is an area that has been present since around May 27, 2022 according to what the patient tells me today. With that being said she does live in assisted living facility. She is seen with her son in the office today. As far as past medical history is concerned she does have a history of breast cancer in 2003 which she did obviously survive. She also had a right knee replacement. In 2009 she had an IVC filter and heart ablation due to atrial fibrillation. With regard to the remainder of her medical history again she is on anticoagulant therapy at this point due to atrial fibrillation, she has hypertension, and chronic venous insufficiency based on what I am seeing currently. In regard to the wound she is currently on Keflex which should be completed on the 25th of this month. Other than that she has had various things applied to the wound including different creams but then at other times she also states that she has had nothing put on and been told to just leave it open to air. There is been a lot of confusion about the best treatment course with regard to her wound which is obviously not good. I discussed with her today that I think we can have the best plan going forward and that that will include keeping this  covered but nonetheless it should help it to heal the most effectively as well. 12/29; left lower leg chronic venous insufficiency. She complains fairly bitterly about the tightness of the wrap. She was put on antibiotics last week which I believe is Keflex. She lives in an assisted living 07-31-2021 upon evaluation today patient appears to be doing well currently in regard to her wound. She is actually been tolerating the dressing changes this is measuring smaller and looking better. I think we are on the right track I do believe that the current compression wrap is helping significantly with her Ealing at this point. Overall I do not see any signs of infection locally nor systemically which is great news. No fevers, chills, nausea, vomiting, or diarrhea. 08-13-2022 upon evaluation today patient's wound on her leg actually showing signs of erythema around the edges of the wound that she is also still having quite a bit of drainage all things considered. Fortunately I do not see any evidence of infection systemically though locally I definitely see some evidence here both with erythema, warmth, and drainage. She has been using the silver alginate dressing we have also had an 3 layer compression wrap though she did not feel good felt very tight at the top of the wrap which is the main concern she has today. 1/25; the patient's wound is smaller today. She is on doxycycline. Culture result from last week showed MRSA but fortunately sensitive to doxycycline which she is taking. She is using TCA Sorbact under 3 layer compression. Electronic Signature(s) Signed: 08/20/2022 4:38:56 PM By: Linton Ham MD Entered By: Dellia Nims,  Lisa Milian on 08/20/2022 14:25:50 -------------------------------------------------------------------------------- Physical Exam Details Patient Name: Date of Service: [name] Royals, DO RCA S 08/20/2022 1:15 PM Medical Record Number: [id number] Patient Account Number: [id number] Date of  Birth/Sex: Treating RN: 1926-03-02 (89 y.o. [name] Primary Care Provider: Frazier Richards Other Clinician: Referring Provider: Treating Provider/Extender: [address], MICHA EL Marlou Starks [place], [place] ([id number]) [id number] Page 2 of 6 Weeks in Treatment: 5 Constitutional Sitting or standing Blood Pressure is within target range for patient.. Pulse regular and within target range for patient.Marland Kitchen Respirations regular, non-labored and within target range.. Temperature is normal and within the target range for the patient.Marland Kitchen appears in no distress. Notes Wound exam; left lateral lower leg. This actually looks quite healthy quite a bit smaller per our intake nurse. There is no surrounding erythema no major drainage. No debridement is required. She has excellent edema control Electronic Signature(s) Signed: 08/20/2022 4:38:56 PM By: Linton Ham MD Entered By: Linton Ham on 08/20/2022 14:29:50 -------------------------------------------------------------------------------- Physician Orders Details Patient Name: Date of Service: [name], DO RCA S 08/20/2022 1:15 PM Medical Record Number: [id number] Patient Account Number: [id number] Date of Birth/Sex: Treating RN: Jun 26, 1926 (89 y.o. [name] Primary Care Provider: Frazier Richards Other Clinician: Referring Provider: Treating Provider/Extender: RO BSO Delane Ginger, MICHA EL Donne Anon in Treatment: 5 Verbal / Phone Orders: No Diagnosis Coding Follow-up Appointments Wound #1 Left,Lateral Lower Leg Return Appointment in 1 week. Nurse Visit as needed Bathing/ Shower/ Hygiene May shower with wound dressing protected with water repellent cover or cast protector. Anesthetic (Use 'Patient Medications' Section for Anesthetic Order Entry) Lidocaine applied to wound bed Edema Control - Lymphedema / Segmental Compressive Device / Other Left Lower Extremity Elevate, Exercise  Daily and A void Standing for Long Periods of Time. Elevate legs to the level of the heart and pump ankles as often as possible Elevate leg(s) parallel to the floor when sitting. Additional Orders / Instructions Follow Nutritious Diet and Increase Protein Intake Medications-Please add to medication list. P.O. Antibiotics Other: - TCA to periwound Wound Treatment Wound #1 - Lower Leg Wound Laterality: Left, Lateral Cleanser: Soap and Water 1 x Per Week/30 Days Discharge Instructions: Gently cleanse wound with antibacterial soap, rinse and pat dry prior to dressing wounds Peri-Wound Care: Triamcinolone Acetonide Cream, 0.1%, 15 (g) tube 1 x Per Week/30 Days Discharge Instructions: Apply as directed. Prim Dressing: Cutimed Sorbact 1.5x 2.38 (in/in) 1 x Per Week/30 Days ary Discharge Instructions: A bacteria- and fungi binding wound dressing, suitable for cavities and fistulas. It is suitable as a wound filler and allows the passage of wound exudate into a secondary dressing. The dressing helps reducing odor and pain and can improve healing. [name], [name] ([id number]) [id number] Page 3 of 6 Secondary Dressing: ABD Pad 5x9 (in/in) 1 x Per Week/30 Days Discharge Instructions: Cover with ABD pad Compression Wrap: 3-LAYER WRAP - Profore Lite LF 3 Multilayer Compression Bandaging System 1 x Per Week/30 Days Discharge Instructions: Apply 3 multi-layer wrap as prescribed. If wrap slides more than 1 inch (mark on leg) call wound care for re-wrap. Electronic Signature(s) Signed: 08/20/2022 4:38:56 PM By: Linton Ham MD Signed: 08/20/2022 4:58:20 PM By: Rosalio Loud MSN RN CNS WTA Entered By: Rosalio Loud on 08/20/2022 16:02:57 -------------------------------------------------------------------------------- Problem List Details Patient Name: Date of Service: [name] Royals, DO RCA S 08/20/2022 1:15 PM Medical Record Number: [id number] Patient Account Number: [id number] Date of  Birth/Sex: Treating RN: 06/27/1926 (89 y.o. [name] Primary Care Provider: Frazier Richards Other  Clinician: Referring Provider: Treating Provider/Extender: RO BSO N, MICHA EL Donne Anon in Treatment: 5 Active Problems ICD-10 Encounter Code Description Active Date MDM Diagnosis I87.332 Chronic venous hypertension (idiopathic) with ulcer and inflammation of left 07/16/2022 No Yes lower extremity L97.822 Non-pressure chronic ulcer of other part of left lower leg with fat layer exposed12/21/2023 No Yes I10 Essential (primary) hypertension 07/16/2022 No Yes I48.0 Paroxysmal atrial fibrillation 07/16/2022 No Yes Z79.01 Long term (current) use of anticoagulants 07/16/2022 No Yes Inactive Problems Resolved Problems Electronic Signature(s) Signed: 08/20/2022 4:38:56 PM By: Linton Ham MD Entered By: Linton Ham on 08/20/2022 14:25:03 [name] ([id number]) [id number] Page 4 of 6 -------------------------------------------------------------------------------- Progress Note Details Patient Name: Date of Service: [name] Royals, DO RCA S 08/20/2022 1:15 PM Medical Record Number: [id number] Patient Account Number: [id number] Date of Birth/Sex: Treating RN: 1926-06-23 (89 y.o. [name] Primary Care Provider: Frazier Richards Other Clinician: Referring Provider: Treating Provider/Extender: Eldridge Dace, MICHA EL Donne Anon in Treatment: 5 Subjective History of Present Illness (HPI) 07-16-2022 upon evaluation today patient presents for initial inspection here in our clinic concerning a wound on the left lateral lower extremity. This is an area that has been present since around May 27, 2022 according to what the patient tells me today. With that being said she does live in assisted living facility. She is seen with her son in the office today. As far as past medical history is concerned she does have a history of  breast cancer in 2003 which she did obviously survive. She also had a right knee replacement. In 2009 she had an IVC filter and heart ablation due to atrial fibrillation. With regard to the remainder of her medical history again she is on anticoagulant therapy at this point due to atrial fibrillation, she has hypertension, and chronic venous insufficiency based on what I am seeing currently. In regard to the wound she is currently on Keflex which should be completed on the 25th of this month. Other than that she has had various things applied to the wound including different creams but then at other times she also states that she has had nothing put on and been told to just leave it open to air. There is been a lot of confusion about the best treatment course with regard to her wound which is obviously not good. I discussed with her today that I think we can have the best plan going forward and that that will include keeping this covered but nonetheless it should help it to heal the most effectively as well. 12/29; left lower leg chronic venous insufficiency. She complains fairly bitterly about the tightness of the wrap. She was put on antibiotics last week which I believe is Keflex. She lives in an assisted living 07-31-2021 upon evaluation today patient appears to be doing well currently in regard to her wound. She is actually been tolerating the dressing changes this is measuring smaller and looking better. I think we are on the right track I do believe that the current compression wrap is helping significantly with her Ealing at this point. Overall I do not see any signs of infection locally nor systemically which is great news. No fevers, chills, nausea, vomiting, or diarrhea. 08-13-2022 upon evaluation today patient's wound on her leg actually showing signs of erythema around the edges of the wound that she is also still having quite a bit of drainage all things considered. Fortunately I do not see  any evidence of infection  systemically though locally I definitely see some evidence here both with erythema, warmth, and drainage. She has been using the silver alginate dressing we have also had an 3 layer compression wrap though she did not feel good felt very tight at the top of the wrap which is the main concern she has today. 1/25; the patient's wound is smaller today. She is on doxycycline. Culture result from last week showed MRSA but fortunately sensitive to doxycycline which she is taking. She is using TCA Sorbact under 3 layer compression. Objective Constitutional Sitting or standing Blood Pressure is within target range for patient.. Pulse regular and within target range for patient.Marland Kitchen Respirations regular, non-labored and within target range.. Temperature is normal and within the target range for the patient.Marland Kitchen appears in no distress. Vitals Time Taken: 1:56 AM, Weight: 106 lbs, Temperature: 97.5 F, Pulse: 67 bpm, Respiratory Rate: 16 breaths/min, Blood Pressure: 142/63 mmHg. General Notes: Wound exam; left lateral lower leg. This actually looks quite healthy quite a bit smaller per our intake nurse. There is no surrounding erythema no major drainage. No debridement is required. She has excellent edema control Integumentary (Hair, Skin) Wound #1 status is Open. Original cause of wound was Gradually Appeared. The date acquired was: 05/27/2022. The wound has been in treatment 5 weeks. The wound is located on the Left,Lateral Lower Leg. The wound measures 3.5cm length x 1cm width x 0.2cm depth; 2.749cm^2 area and 0.55cm^3 volume. There is Fat Layer (Subcutaneous Tissue) exposed. There is a large amount of serous drainage noted. The wound margin is indistinct and nonvisible. There is small (1- 33%) red, friable granulation within the wound bed. There is a large (67-100%) amount of necrotic tissue within the wound bed including Adherent Slough. Assessment [name], [name] ([id number])  [id number] Page 5 of 6 Active Problems ICD-10 Chronic venous hypertension (idiopathic) with ulcer and inflammation of left lower extremity Non-pressure chronic ulcer of other part of left lower leg with fat layer exposed Essential (primary) hypertension Paroxysmal atrial fibrillation Long term (current) use of anticoagulants Plan 1. Everything looks quite good here. Wound is a lot smaller 2. She will complete her doxycycline as prescribed last week. There is no need for additional antibiotics. She cultured MRSA which should have been well covered here. I also do not think right now she requires topical antibiotics. 3. Her edema control in the left leg is excellent as compared with the right. 4. She lives in an assisted living. She does not have a prior history of wounds. I did initiate the discussion about compression stockings or at least support hose I am not sure that we be able to arrange this or not. She has significant chronic venous disease Electronic Signature(s) Signed: 08/20/2022 4:38:56 PM By: Linton Ham MD Entered By: Linton Ham on 08/20/2022 14:31:09 -------------------------------------------------------------------------------- SuperBill Details Patient Name: Date of Service: [name] Royals, DO RCA S 08/20/2022 Medical Record Number: [id number] Patient Account Number: [id number] Date of Birth/Sex: Treating RN: [birth year] (89 y.o. [name] Primary Care Provider: Frazier Richards Other Clinician: Referring Provider: Treating Provider/Extender: Eldridge Dace, MICHA EL Donne Anon in Treatment: 5 Diagnosis Coding ICD-10 Codes Code Description [phone] Chronic venous hypertension (idiopathic) with ulcer and inflammation of left lower extremity L97.822 Non-pressure chronic ulcer of other part of left lower leg with fat layer exposed I10 Essential (primary) hypertension I48.0 Paroxysmal atrial fibrillation Z79.01 Long term  (current) use of anticoagulants Facility Procedures : CPT4 Code: [id number] Description: 99213 - WOUND CARE VISIT-LEV 3 EST PT  Modifier: Quantity: 1 Physician Procedures Electronic Signature(s) Signed: 08/20/2022 4:04:28 PM By: Rosalio Loud MSN RN CNS WTA Signed: 08/20/2022 4:38:56 PM By: Linton Ham MD Entered By: Rosalio Loud on 08/20/2022 16:04:28

## 2022-08-27 ENCOUNTER — Encounter: Payer: Medicare Other | Attending: Physician Assistant | Admitting: Physician Assistant

## 2022-08-27 DIAGNOSIS — Z95828 Presence of other vascular implants and grafts: Secondary | ICD-10-CM | POA: Insufficient documentation

## 2022-08-27 DIAGNOSIS — Z7901 Long term (current) use of anticoagulants: Secondary | ICD-10-CM | POA: Diagnosis not present

## 2022-08-27 DIAGNOSIS — Z853 Personal history of malignant neoplasm of breast: Secondary | ICD-10-CM | POA: Insufficient documentation

## 2022-08-27 DIAGNOSIS — Z96651 Presence of right artificial knee joint: Secondary | ICD-10-CM | POA: Diagnosis not present

## 2022-08-27 DIAGNOSIS — I87332 Chronic venous hypertension (idiopathic) with ulcer and inflammation of left lower extremity: Secondary | ICD-10-CM | POA: Diagnosis present

## 2022-08-27 DIAGNOSIS — I480 Paroxysmal atrial fibrillation: Secondary | ICD-10-CM | POA: Diagnosis not present

## 2022-08-27 DIAGNOSIS — I10 Essential (primary) hypertension: Secondary | ICD-10-CM | POA: Insufficient documentation

## 2022-08-27 DIAGNOSIS — L97822 Non-pressure chronic ulcer of other part of left lower leg with fat layer exposed: Secondary | ICD-10-CM | POA: Insufficient documentation

## 2022-08-27 NOTE — Progress Notes (Addendum)
EURA, MCCAUSLIN ([id number]) [id number] Page 1 of 7 Visit Report for 08/27/2022 Chief Complaint Document Details Patient Name: Date of Service: [name] Royals, DO RCA S 08/27/2022 2:15 PM Medical Record Number: [id number] Patient Account Number: [id number] Date of Birth/Sex: Treating RN: April 06, 1926 (89 y.o. [name] Primary Care Provider: Frazier Richards Other Clinician: Referring Provider: Treating Provider/Extender: [name] in Treatment: 6 Information Obtained from: Patient Chief Complaint Left lateral LE ulcer Electronic Signature(s) Signed: 08/27/2022 2:14:58 PM By: Worthy Keeler PA-C Entered By: Worthy Keeler on 08/27/2022 14:14:58 -------------------------------------------------------------------------------- HPI Details Patient Name: Date of Service: [name] MSEY, DO RCA S 08/27/2022 2:15 PM Medical Record Number: [id number] Patient Account Number: [id number] Date of Birth/Sex: Treating RN: 07/04/1926 (89 y.o. [name] Primary Care Provider: Frazier Richards Other Clinician: Referring Provider: Treating Provider/Extender: [name] in Treatment: 6 History of Present Illness HPI Description: 07-16-2022 upon evaluation today patient presents for initial inspection here in our clinic concerning a wound on the left lateral lower extremity. This is an area that has been present since around May 27, 2022 according to what the patient tells me today. With that being said she does live in assisted living facility. She is seen with her son in the office today. As far as past medical history is concerned she does have a history of breast cancer in 2003 which she did obviously survive. She also had a right knee replacement. In 2009 she had an IVC filter and heart ablation due to atrial fibrillation. With regard to the remainder of her medical history again she is on anticoagulant therapy at this  point due to atrial fibrillation, she has hypertension, and chronic venous insufficiency based on what I am seeing currently. In regard to the wound she is currently on Keflex which should be completed on the 25th of this month. Other than that she has had various things applied to the wound including different creams but then at other times she also states that she has had nothing put on and been told to just leave it open to air. There is been a lot of confusion about the best treatment course with regard to her wound which is obviously not good. I discussed with her today that I think we can have the best plan going forward and that that will include keeping this covered but nonetheless it should help it to heal the most effectively as well. 12/29; left lower leg chronic venous insufficiency. She complains fairly bitterly about the tightness of the wrap. She was put on antibiotics last week which I believe is Keflex. She lives in an assisted living 07-31-2021 upon evaluation today patient appears to be doing well currently in regard to her wound. She is actually been tolerating the dressing changes this is measuring smaller and looking better. I think we are on the right track I do believe that the current compression wrap is helping significantly with her [facility] ([id number]) [id number] Page 2 of 7 this point. Overall I do not see any signs of infection locally nor systemically which is great news. No fevers, chills, nausea, vomiting, or diarrhea. 08-13-2022 upon evaluation today patient's wound on her leg actually showing signs of erythema around the edges of the wound that she is also still having quite a bit of drainage all things considered. Fortunately I do not see any evidence of infection systemically though locally I definitely see some evidence here both with erythema, warmth, and drainage.  She has been using the silver alginate dressing we have  also had an 3 layer compression wrap though she did not feel good felt very tight at the top of the wrap which is the main concern she has today. 1/25; the patient's wound is smaller today. She is on doxycycline. Culture result from last week showed MRSA but fortunately sensitive to doxycycline which she is taking. She is using TCA Sorbact under 3 layer compression. 08-27-2022 upon evaluation today patient appears to be doing well currently in regard to her left leg ulcer which does not appear to be doing poorly at all in fact this appears to be healing quite nicely. I am extremely pleased with where we stand I do not see any signs of active infection locally nor systemically which is great news. No fevers, chills, nausea, vomiting, or diarrhea. Electronic Signature(s) Signed: 08/27/2022 3:01:58 PM By: Worthy Keeler PA-C Entered By: Worthy Keeler on 08/27/2022 15:01:57 -------------------------------------------------------------------------------- Physical Exam Details Patient Name: Date of Service: [name] MSEY, DO RCA S 08/27/2022 2:15 PM Medical Record Number: [id number] Patient Account Number: [id number] Date of Birth/Sex: Treating RN: 1926/04/09 (89 y.o. [name] Primary Care Provider: Frazier Richards Other Clinician: Referring Provider: Treating Provider/Extender: [name] in Treatment: 6 Constitutional Well-nourished and well-hydrated in no acute distress. Respiratory normal breathing without difficulty. Psychiatric this patient is able to make decisions and demonstrates good insight into disease process. Alert and Oriented x 3. pleasant and cooperative. Notes Upon inspection patient's wound bed actually showed signs of good granulation epithelization at this point. Fortunately I do not see any evidence of infection locally nor systemically which is great news and overall I am extremely pleased with where we stand today. Electronic  Signature(s) Signed: 08/27/2022 3:02:15 PM By: Worthy Keeler PA-C Entered By: Worthy Keeler on 08/27/2022 15:02:15 -------------------------------------------------------------------------------- Physician Orders Details Patient Name: Date of Service: [name] MSEY, DO RCA S 08/27/2022 2:15 PM Medical Record Number: [id number] Patient Account Number: [id number] Date of Birth/Sex: Treating RN: 09/16/1925 (89 y.o. [name] [place], [place] ([id number]) [id number] Page 3 of 7 Primary Care Provider: Frazier Richards Other Clinician: Referring Provider: Treating Provider/Extender: [name] in Treatment: 6 Verbal / Phone Orders: No Diagnosis Coding ICD-10 Coding Code Description [phone] Chronic venous hypertension (idiopathic) with ulcer and inflammation of left lower extremity L97.822 Non-pressure chronic ulcer of other part of left lower leg with fat layer exposed I10 Essential (primary) hypertension I48.0 Paroxysmal atrial fibrillation Z79.01 Long term (current) use of anticoagulants Follow-up Appointments Wound #1 Left,Lateral Lower Leg Return Appointment in 1 week. Nurse Visit as needed Bathing/ Shower/ Hygiene May shower with wound dressing protected with water repellent cover or cast protector. - Do not wear slip on shoes without a back. Wear shoes that will support the right foot without slipping Change foam and bandaid on Right 2nd toe 3 x week Anesthetic (Use 'Patient Medications' Section for Anesthetic Order Entry) Lidocaine applied to wound bed Edema Control - Lymphedema / Segmental Compressive Device / Other Left Lower Extremity Elevate, Exercise Daily and A void Standing for Long Periods of Time. Elevate legs to the level of the heart and pump ankles as often as possible Elevate leg(s) parallel to the floor when sitting. Additional Orders / Instructions Follow Nutritious Diet and Increase Protein  Intake Medications-Please add to medication list. P.O. Antibiotics Other: - TCA to periwound Wound Treatment Wound #1 - Lower Leg Wound Laterality: Left, Lateral Cleanser: Soap and Water 1  x Per Week/30 Days Discharge Instructions: Gently cleanse wound with antibacterial soap, rinse and pat dry prior to dressing wounds Peri-Wound Care: Triamcinolone Acetonide Cream, 0.1%, 15 (g) tube 1 x Per Week/30 Days Discharge Instructions: Apply as directed. Prim Dressing: Cutimed Sorbact 1.5x 2.38 (in/in) 1 x Per Week/30 Days ary Discharge Instructions: A bacteria- and fungi binding wound dressing, suitable for cavities and fistulas. It is suitable as a wound filler and allows the passage of wound exudate into a secondary dressing. The dressing helps reducing odor and pain and can improve healing. Secondary Dressing: ABD Pad 5x9 (in/in) 1 x Per Week/30 Days Discharge Instructions: Cover with ABD pad Compression Wrap: 3-LAYER WRAP - Profore Lite LF 3 Multilayer Compression Bandaging System 1 x Per Week/30 Days Discharge Instructions: Apply 3 multi-layer wrap as prescribed. If wrap slides more than 1 inch (mark on leg) call wound care for re-wrap. Wound #2 - T Second oe Wound Laterality: Anterior Prim Dressing: Non-Adherent Pad 3x8 (in/in) 3 x Per Week/30 Days ary Discharge Instructions: Add to wound bed to alleviate sticking. Secondary Dressing: Coverlet Latex-Free Fabric Adhesive Dressings 3 x Per Week/30 Days Discharge Instructions: Knuckle Electronic Signature(s) Signed: 08/27/2022 4:41:53 PM By: Worthy Keeler PA-C Signed: 08/28/2022 1:21:58 PM By: Rosalio Loud MSN RN CNS WTA Entered By: Rosalio Loud on 08/27/2022 15:06:49 [name] ([id number]) [id number] Page 4 of 7 -------------------------------------------------------------------------------- Problem List Details Patient Name: Date of Service: [name] Royals, DO RCA S 08/27/2022 2:15 PM Medical Record Number:  [id number] Patient Account Number: [id number] Date of Birth/Sex: Treating RN: 28-May-1926 (89 y.o. [name] Primary Care Provider: Frazier Richards Other Clinician: Referring Provider: Treating Provider/Extender: [name] in Treatment: 6 Active Problems ICD-10 Encounter Code Description Active Date MDM Diagnosis I87.332 Chronic venous hypertension (idiopathic) with ulcer and inflammation of left 07/16/2022 No Yes lower extremity L97.822 Non-pressure chronic ulcer of other part of left lower leg with fat layer exposed12/21/2023 No Yes I10 Essential (primary) hypertension 07/16/2022 No Yes I48.0 Paroxysmal atrial fibrillation 07/16/2022 No Yes Z79.01 Long term (current) use of anticoagulants 07/16/2022 No Yes Inactive Problems Resolved Problems Electronic Signature(s) Signed: 08/27/2022 2:14:55 PM By: Worthy Keeler PA-C Entered By: Worthy Keeler on 08/27/2022 14:14:54 -------------------------------------------------------------------------------- Progress Note Details Patient Name: Date of Service: [name] MSEY, DO RCA S 08/27/2022 2:15 PM Medical Record Number: [id number] Patient Account Number: [id number] Date of Birth/Sex: Treating RN: 17-Mar-1926 (89 y.o. [name] [place], [place] ([id number]) [id number] Page 5 of 7 Primary Care Provider: Frazier Richards Other Clinician: Referring Provider: Treating Provider/Extender: [name] in Treatment: 6 Subjective Chief Complaint Information obtained from Patient Left lateral LE ulcer History of Present Illness (HPI) 07-16-2022 upon evaluation today patient presents for initial inspection here in our clinic concerning a wound on the left lateral lower extremity. This is an area that has been present since around May 27, 2022 according to what the patient tells me today. With that being said she does live in assisted living facility. She  is seen with her son in the office today. As far as past medical history is concerned she does have a history of breast cancer in 2003 which she did obviously survive. She also had a right knee replacement. In 2009 she had an IVC filter and heart ablation due to atrial fibrillation. With regard to the remainder of her medical history again she is on anticoagulant therapy at this point due to atrial fibrillation, she has hypertension, and chronic venous insufficiency based on  what I am seeing currently. In regard to the wound she is currently on Keflex which should be completed on the 25th of this month. Other than that she has had various things applied to the wound including different creams but then at other times she also states that she has had nothing put on and been told to just leave it open to air. There is been a lot of confusion about the best treatment course with regard to her wound which is obviously not good. I discussed with her today that I think we can have the best plan going forward and that that will include keeping this covered but nonetheless it should help it to heal the most effectively as well. 12/29; left lower leg chronic venous insufficiency. She complains fairly bitterly about the tightness of the wrap. She was put on antibiotics last week which I believe is Keflex. She lives in an assisted living 07-31-2021 upon evaluation today patient appears to be doing well currently in regard to her wound. She is actually been tolerating the dressing changes this is measuring smaller and looking better. I think we are on the right track I do believe that the current compression wrap is helping significantly with her [place] at this point. Overall I do not see any signs of infection locally nor systemically which is great news. No fevers, chills, nausea, vomiting, or diarrhea. 08-13-2022 upon evaluation today patient's wound on her leg actually showing signs of erythema around the edges of  the wound that she is also still having quite a bit of drainage all things considered. Fortunately I do not see any evidence of infection systemically though locally I definitely see some evidence here both with erythema, warmth, and drainage. She has been using the silver alginate dressing we have also had an 3 layer compression wrap though she did not feel good felt very tight at the top of the wrap which is the main concern she has today. 1/25; the patient's wound is smaller today. She is on doxycycline. Culture result from last week showed MRSA but fortunately sensitive to doxycycline which she is taking. She is using TCA Sorbact under 3 layer compression. 08-27-2022 upon evaluation today patient appears to be doing well currently in regard to her left leg ulcer which does not appear to be doing poorly at all in fact this appears to be healing quite nicely. I am extremely pleased with where we stand I do not see any signs of active infection locally nor systemically which is great news. No fevers, chills, nausea, vomiting, or diarrhea. Objective Constitutional Well-nourished and well-hydrated in no acute distress. Vitals Time Taken: 2:12 PM, Weight: 106 lbs, Temperature: 97.7 F, Pulse: 66 bpm, Respiratory Rate: 16 breaths/min, Blood Pressure: 141/59 mmHg. Respiratory normal breathing without difficulty. Psychiatric this patient is able to make decisions and demonstrates good insight into disease process. Alert and Oriented x 3. pleasant and cooperative. General Notes: Upon inspection patient's wound bed actually showed signs of good granulation epithelization at this point. Fortunately I do not see any evidence of infection locally nor systemically which is great news and overall I am extremely pleased with where we stand today. Integumentary (Hair, Skin) Wound #1 status is Open. Original cause of wound was Gradually Appeared. The date acquired was: 05/27/2022. The wound has been in treatment  6 weeks. The wound is located on the Left,Lateral Lower Leg. The wound measures 1cm length x 0.6cm width x 0.1cm depth; 0.471cm^2 area and 0.047cm^3 volume. There is  Fat Layer (Subcutaneous Tissue) exposed. There is a large amount of serous drainage noted. The wound margin is indistinct and nonvisible. There is small (1-33%) red, friable granulation within the wound bed. There is a large (67-100%) amount of necrotic tissue within the wound bed including Adherent Slough. Wound #2 status is Open. Original cause of wound was Pressure Injury. The date acquired was: 08/27/2022. The wound is located on the Anterior T Second. The oe wound measures 0.6cm length x 0.6cm width x 0.1cm depth; 0.283cm^2 area and 0.028cm^3 volume. There is no tunneling or undermining noted. There is a medium amount of serous drainage noted. There is medium (34-66%) red, pink granulation within the wound bed. There is no necrotic tissue within the wound bed. Assessment [address] LYLIE, [place] ([id number]) [id number] Page 6 of 7 ICD-10 Chronic venous hypertension (idiopathic) with ulcer and inflammation of left lower extremity Non-pressure chronic ulcer of other part of left lower leg with fat layer exposed Essential (primary) hypertension Paroxysmal atrial fibrillation Long term (current) use of anticoagulants Plan Follow-up Appointments: Wound #1 Left,Lateral Lower Leg: Return Appointment in 1 week. Nurse Visit as needed Bathing/ Shower/ Hygiene: May shower with wound dressing protected with water repellent cover or cast protector. - Do not wear slip on shoes without a back. Wear shoes that will support the right foot without slipping Anesthetic (Use 'Patient Medications' Section for Anesthetic Order Entry): Lidocaine applied to wound bed Edema Control - Lymphedema / Segmental Compressive Device / Other: Elevate, Exercise Daily and Avoid Standing for Long Periods of Time. Elevate  legs to the level of the heart and pump ankles as often as possible Elevate leg(s) parallel to the floor when sitting. Additional Orders / Instructions: Follow Nutritious Diet and Increase Protein Intake Medications-Please add to medication list.: P.O. Antibiotics Other: - TCA to periwound WOUND #1: - Lower Leg Wound Laterality: Left, Lateral Cleanser: Soap and Water 1 x Per Week/30 Days Discharge Instructions: Gently cleanse wound with antibacterial soap, rinse and pat dry prior to dressing wounds Peri-Wound Care: Triamcinolone Acetonide Cream, 0.1%, 15 (g) tube 1 x Per Week/30 Days Discharge Instructions: Apply as directed. Prim Dressing: Cutimed Sorbact 1.5x 2.38 (in/in) 1 x Per Week/30 Days ary Discharge Instructions: A bacteria- and fungi binding wound dressing, suitable for cavities and fistulas. It is suitable as a wound filler and allows the passage of wound exudate into a secondary dressing. The dressing helps reducing odor and pain and can improve healing. Secondary Dressing: ABD Pad 5x9 (in/in) 1 x Per Week/30 Days Discharge Instructions: Cover with ABD pad Com pression Wrap: 3-LAYER WRAP - Profore Lite LF 3 Multilayer Compression Bandaging System 1 x Per Week/30 Days Discharge Instructions: Apply 3 multi-layer wrap as prescribed. If wrap slides more than 1 inch (mark on leg) call wound care for re-wrap. WOUND #2: - T Second Wound Laterality: Anterior oe Prim Dressing: Non-Adherent Pad 3x8 (in/in) Every Other Day/30 Days ary Discharge Instructions: Add to wound bed to alleviate sticking. Secondary Dressing: Coverlet Latex-Free Fabric Adhesive Dressings Every Other Day/30 Days Discharge Instructions: Knuckle 1. I am going to recommend that we have the patient continue to monitor for any signs of infection or worsening. Based on what I am seeing I do believe that we are making really good progress here and I am extremely pleased with where things stand. 2. I am also going to  recommend that the patient should continue with regard to her leg to utilize the dressings as before. This includes the 3 layer compression  wrap along with the triamcinolone and the Sorbact which I think has done an excellent job at this point. 3. In regard to her shoes I do believe that she needs to not be wearing any slip on athletic type shoes. She is to have shoes that go on and tie or else bedroom slippers which would be soft enough not to bother the second toe where it overlaps the first toe on the right foot and subsequently rubs significantly. If not then I think this wound is going to get worse not better. I discussed this in great detail with the patient and her son today and subsequently I did go ahead as well and fill out the paperwork for the facility so they would have acknowledgment of this as well. We will see patient back for reevaluation in 1 week here in the clinic. If anything worsens or changes patient will contact our office for additional recommendations. Electronic Signature(s) Signed: 08/27/2022 3:04:30 PM By: Worthy Keeler PA-C Entered By: Worthy Keeler on 08/27/2022 15:04:29 SuperBill Details -------------------------------------------------------------------------------- [name] ([id number]) [id number] Page 7 of 7 Patient Name: Date of Service: [name] Royals, DO RCA S 08/27/2022 Medical Record Number: [id number] Patient Account Number: [id number] Date of Birth/Sex: Treating RN: 09-15-1925 (89 y.o. [name] Primary Care Provider: Frazier Richards Other Clinician: Referring Provider: Treating Provider/Extender: [name] in Treatment: 6 Diagnosis Coding ICD-10 Codes Code Description [phone] Chronic venous hypertension (idiopathic) with ulcer and inflammation of left lower extremity L97.822 Non-pressure chronic ulcer of other part of left lower leg with fat layer exposed I10 Essential (primary)  hypertension I48.0 Paroxysmal atrial fibrillation Z79.01 Long term (current) use of anticoagulants Facility Procedures CPT4 Code Description Modifier Quantity [id number] [phone] - WOUND CARE VISIT-LEV 3 EST PT 1 Physician Procedures Quantity CPT4 Code Description Modifier [id number] - WC PHYS LEVEL 3 - EST PT 1 ICD-10 Diagnosis Description I87.332 Chronic venous hypertension (idiopathic) with ulcer and inflammation of left lower extremity L97.822 Non-pressure chronic ulcer of other part of left lower leg with fat layer exposed I10 Essential (primary) hypertension I48.0 Paroxysmal atrial fibrillation Electronic Signature(s) Signed: 08/27/2022 3:04:51 PM By: Worthy Keeler PA-C Entered By: Worthy Keeler on 08/27/2022 15:04:50

## 2022-08-28 NOTE — Progress Notes (Signed)
[name], [name] ([id number]) [id number] Page 1 of 11 Visit Report for 08/27/2022 Arrival Information Details Patient Name: Date of Service: [name], [name] 08/27/2022 2:15 PM Medical Record Number: [id number] Patient Account Number: [id number] Date of Birth/Sex: Treating RN: 1926/03/11 (89 y.o. [name] Primary Care Argie Applegate: Frazier Richards Other Clinician: Referring Elcie Pelster: Treating Nahima Ales/Extender: [name] in Treatment: 6 Visit Information History Since Last Visit Added or deleted any medications: No Patient Arrived: [name] Any new allergies or adverse reactions: No Arrival Time: 14:11 Had a fall or experienced change in No Accompanied By: son activities of daily living that may affect Transfer Assistance: None risk of falls: Patient Identification Verified: Yes Hospitalized since last visit: No Secondary Verification Process Completed: Yes Has Dressing in Place as Prescribed: Yes Patient Requires Transmission-Based Precautions: No Has Compression in Place as Prescribed: Yes Patient Has Alerts: Yes Pain Present Now: No Patient Alerts: Patient on Blood Thinner NOT DIABETIC Eliquis Electronic Signature([name]) Signed: 08/28/2022 1:21:58 PM By: Rosalio Loud MSN RN CNS WTA Entered By: Rosalio Loud on 08/27/2022 14:12:31 -------------------------------------------------------------------------------- Clinic Level of Care Assessment Details Patient Name: Date of Service: [name], [name] 08/27/2022 2:15 PM Medical Record Number: [id number] Patient Account Number: [id number] Date of Birth/Sex: Treating RN: 01-Feb-1926 (89 y.o. [name] Primary Care Nanami Whitelaw: Frazier Richards Other Clinician: Referring Alonna Bartling: Treating Jessamy Torosyan/Extender: [name] in Treatment: 6 Clinic Level of Care Assessment Items TOOL 4 Quantity Score X- 1 0 Use when only an EandM is performed on FOLLOW-UP  visit ASSESSMENTS - Nursing Assessment / Reassessment X- 1 10 Reassessment of Co-morbidities (includes updates in patient status) X- 1 5 Reassessment of Adherence to Treatment Plan ASSESSMENTS - Wound and Skin A ssessment / Reassessment X - Simple Wound Assessment / Reassessment - one wound 1 5 [name], [name] ([id number]) [id number] Page 2 of 11 '[]'$  - 0 Complex Wound Assessment / Reassessment - multiple wounds '[]'$  - 0 Dermatologic / Skin Assessment (not related to wound area) ASSESSMENTS - Focused Assessment '[]'$  - 0 Circumferential Edema Measurements - multi extremities '[]'$  - 0 Nutritional Assessment / Counseling / Intervention '[]'$  - 0 Lower Extremity Assessment (monofilament, tuning fork, pulses) '[]'$  - 0 Peripheral Arterial Disease Assessment (using hand held doppler) ASSESSMENTS - Ostomy and/or Continence Assessment and Care '[]'$  - 0 Incontinence Assessment and Management '[]'$  - 0 Ostomy Care Assessment and Management (repouching, etc.) PROCESS - Coordination of Care X - Simple Patient / Family Education for ongoing care 1 15 '[]'$  - 0 Complex (extensive) Patient / Family Education for ongoing care X- 1 10 Staff obtains Programmer, systems, Records, T Results / Process Orders est '[]'$  - 0 Staff telephones HHA, Nursing Homes / Clarify orders / etc '[]'$  - 0 Routine Transfer to another Facility (non-emergent condition) '[]'$  - 0 Routine Hospital Admission (non-emergent condition) '[]'$  - 0 New Admissions / Biomedical engineer / Ordering NPWT Apligraf, etc. , '[]'$  - 0 Emergency Hospital Admission (emergent condition) X- 1 10 Simple Discharge Coordination '[]'$  - 0 Complex (extensive) Discharge Coordination PROCESS - Special Needs '[]'$  - 0 Pediatric / Minor Patient Management '[]'$  - 0 Isolation Patient Management '[]'$  - 0 Hearing / Language / Visual special needs '[]'$  - 0 Assessment of Community assistance (transportation, D/C planning, etc.) '[]'$  - 0 Additional assistance /  Altered mentation '[]'$  - 0 Support Surface([name]) Assessment (bed, cushion, seat, etc.) INTERVENTIONS - Wound Cleansing / Measurement X - Simple Wound Cleansing - one wound 1 5 '[]'$  - 0 Complex Wound  Cleansing - multiple wounds X- 1 5 Wound Imaging (photographs - any number of wounds) '[]'$  - 0 Wound Tracing (instead of photographs) X- 1 5 Simple Wound Measurement - one wound '[]'$  - 0 Complex Wound Measurement - multiple wounds INTERVENTIONS - Wound Dressings X - Small Wound Dressing one or multiple wounds 1 10 '[]'$  - 0 Medium Wound Dressing one or multiple wounds '[]'$  - 0 Large Wound Dressing one or multiple wounds '[]'$  - 0 Application of Medications - topical '[]'$  - 0 Application of Medications - injection INTERVENTIONS - Miscellaneous '[]'$  - 0 External ear exam '[]'$  - 0 Specimen Collection (cultures, biopsies, blood, body fluids, etc.) [name], [name] ([id number]) [id number] Page 3 of 11 '[]'$  - 0 Specimen([name]) / Culture([name]) sent or taken to Lab for analysis '[]'$  - 0 Patient Transfer (multiple staff / Harrel Lemon Lift / Similar devices) '[]'$  - 0 Simple Staple / Suture removal (25 or less) '[]'$  - 0 Complex Staple / Suture removal (26 or more) '[]'$  - 0 Hypo / Hyperglycemic Management (close monitor of Blood Glucose) '[]'$  - 0 Ankle / Brachial Index (ABI) - [name] not check if billed separately X- 1 5 Vital Signs Has the patient been seen at the hospital within the last three years: Yes Total Score: 85 Level Of Care: New/Established - Level 3 Electronic Signature([name]) Signed: 08/28/2022 1:21:58 PM By: Rosalio Loud MSN RN CNS WTA Entered By: Rosalio Loud on 08/27/2022 14:51:35 -------------------------------------------------------------------------------- Encounter Discharge Information Details Patient Name: Date of Service: [name], [name] 08/27/2022 2:15 PM Medical Record Number: [id number] Patient Account Number: [id number] Date of Birth/Sex: Treating RN: January 15, 1926 (89 y.o. [name] Primary Care Tanashia Ciesla: Frazier Richards Other Clinician: Referring Hiedi Touchton: Treating Tamesha Ellerbrock/Extender: [name] in Treatment: 6 Encounter Discharge Information Items Discharge Condition: Stable Ambulatory Status: Walker Discharge Destination: Home Transportation: Private Auto Accompanied By: son Schedule Follow-up Appointment: Yes Clinical Summary of Care: Electronic Signature([name]) Signed: 08/28/2022 1:21:58 PM By: Rosalio Loud MSN RN CNS WTA Entered By: Rosalio Loud on 08/27/2022 15:08:24 -------------------------------------------------------------------------------- Lower Extremity Assessment Details Patient Name: Date of Service: [name], [name] 08/27/2022 2:15 PM Medical Record Number: [id number] Patient Account Number: [id number] Date of Birth/Sex: Treating RN: 1925/10/20 (89 y.o. [name] [place], [place] ([id number]) [id number] Page 4 of 11 Primary Care Rannie Craney: Frazier Richards Other Clinician: Referring Tank Difiore: Treating Lonia Roane/Extender: [name] in Treatment: 6 Edema Assessment Assessed: [Left: Yes] [Right: No] [Left: Edema] [Right: :] Calf Left: Right: Point of Measurement: 30 cm From Medial Instep 35 cm Ankle Left: Right: Point of Measurement: 12 cm From Medial Instep 19 cm Vascular Assessment Pulses: Dorsalis Pedis Palpable: [Left:Yes] Electronic Signature([name]) Signed: 08/28/2022 1:21:58 PM By: Rosalio Loud MSN RN CNS WTA Entered By: Rosalio Loud on 08/27/2022 14:33:29 -------------------------------------------------------------------------------- Multi Wound Chart Details Patient Name: Date of Service: [name], [name] 08/27/2022 2:15 PM Medical Record Number: [id number] Patient Account Number: [id number] Date of Birth/Sex: Treating RN: 13-Oct-1925 (89 y.o. [name] Primary Care Breeze Angell: Frazier Richards Other Clinician: Referring Johnattan Strassman: Treating  [name]/Extender: [name] in Treatment: 6 Vital Signs Height(in): Pulse(bpm): 17 Weight(lbs): 106 Blood Pressure(mmHg): 141/59 Body Mass Index(BMI): Temperature(F): 97.7 Respiratory Rate(breaths/min): 16 [1:Photos:] [N/A:N/A] Left, Lateral Lower Leg Anterior T Second oe N/A Wound Location: Gradually Appeared Pressure Injury N/A Wounding Event: Venous Leg Ulcer Pressure Ulcer N/A Primary Etiology: Hypertension, Dementia, Received Hypertension, Dementia, Received N/A Comorbid History: Chemotherapy Chemotherapy DELMY, HOLDREN ([id number]) [id number] Page 5 of 11 05/27/2022 08/27/2022  N/A Date Acquired: 6 0 N/A Weeks of Treatment: Open Open N/A Wound Status: No No N/A Wound Recurrence: 1x0.6x0.1 0.6x0.6x0.1 N/A Measurements L x W x D (cm) 0.471 0.283 N/A A (cm) : rea 0.047 0.028 N/A Volume (cm) : 98.20% N/A N/A % Reduction in Area: 99.10% N/A N/A % Reduction in Volume: Full Thickness Without Exposed Category/Stage II N/A Classification: Support Structures Large Medium N/A Exudate Amount: Serous Serous N/A Exudate Type: amber amber N/A Exudate Color: Indistinct, nonvisible N/A N/A Wound Margin: Small (1-33%) Medium (34-66%) N/A Granulation Amount: Red, Friable Red, Pink N/A Granulation Quality: Large (67-100%) None Present (0%) N/A Necrotic Amount: Fat Layer (Subcutaneous Tissue): Yes Fascia: No N/A Exposed Structures: Fascia: No Fat Layer (Subcutaneous Tissue): No Tendon: No Tendon: No Muscle: No Muscle: No Joint: No Joint: No Bone: No Bone: No None Small (1-33%) N/A Epithelialization: Treatment Notes Electronic Signature([name]) Signed: 08/28/2022 1:21:58 PM By: Rosalio Loud MSN RN CNS WTA Entered By: Rosalio Loud on 08/27/2022 14:43:38 -------------------------------------------------------------------------------- Multi-Disciplinary Care Plan Details Patient Name: Date of Service: [name],  [name] 08/27/2022 2:15 PM Medical Record Number: [id number] Patient Account Number: [id number] Date of Birth/Sex: Treating RN: 05/05/1926 (89 y.o. [name] Primary Care Domonique Cothran: Frazier Richards Other Clinician: Referring Norwood Quezada: Treating Suriya Kovarik/Extender: [name] in Treatment: 6 Active Inactive Pain, Acute or Chronic Nursing Diagnoses: Pain Management - Non-cyclic Acute (Procedural) Potential alteration in comfort, pain Goals: Patient will verbalize adequate pain control and receive pain control interventions during procedures as needed Date Initiated: 07/16/2022 Target Resolution Date: 08/13/2022 Goal Status: Active Patient/caregiver will verbalize adequate pain control between visits Date Initiated: 07/16/2022 Target Resolution Date: 08/13/2022 Goal Status: Active Patient/caregiver will verbalize comfort level met Date Initiated: 07/16/2022 Target Resolution Date: 08/13/2022 Goal Status: Active Interventions: Reposition patient for comfort [name], [name] ([id number]) [id number] Page 6 of 11 Treatment Activities: Administer pain control measures as ordered : 07/16/2022 Notes: Venous Leg Ulcer Nursing Diagnoses: Knowledge deficit related to disease process and management Potential for venous Insuffiency (use before diagnosis confirmed) Goals: Patient will maintain optimal edema control Date Initiated: 07/16/2022 Target Resolution Date: 08/13/2022 Goal Status: Active Patient/caregiver will verbalize understanding of disease process and disease management Date Initiated: 07/16/2022 Target Resolution Date: 08/13/2022 Goal Status: Active Interventions: Assess peripheral edema status every visit. Compression as ordered Notes: Wound/Skin Impairment Nursing Diagnoses: Impaired tissue integrity Knowledge deficit related to smoking impact on wound healing Knowledge deficit related to ulceration/compromised skin  integrity Goals: Patient/caregiver will verbalize understanding of skin care regimen Date Initiated: 07/16/2022 Target Resolution Date: 08/13/2022 Goal Status: Active Ulcer/skin breakdown will have a volume reduction of 30% by week 4 Date Initiated: 07/16/2022 Date Inactivated: 08/13/2022 Target Resolution Date: 08/13/2022 Goal Status: Met Ulcer/skin breakdown will have a volume reduction of 50% by week 8 Date Initiated: 08/13/2022 Target Resolution Date: 09/10/2022 Goal Status: Active Interventions: Assess patient/caregiver ability to obtain necessary supplies Assess patient/caregiver ability to perform ulcer/skin care regimen upon admission and as needed Assess ulceration([name]) every visit Provide education on ulcer and skin care Treatment Activities: Skin care regimen initiated : 07/16/2022 Topical wound management initiated : 07/16/2022 Notes: Electronic Signature([name]) Signed: 08/28/2022 1:21:58 PM By: Rosalio Loud MSN RN CNS WTA Entered By: Rosalio Loud on 08/27/2022 15:07:13 Pain Assessment Details -------------------------------------------------------------------------------- [name] ([id number]) [id number] Page 7 of 11 Patient Name: Date of Service: [name], [name] 08/27/2022 2:15 PM Medical Record Number: [id number] Patient Account Number: [id number] Date of Birth/Sex: Treating RN: Aug 23, 1925 (89 y.o. [name] Primary  Care Samaad Hashem: Frazier Richards Other Clinician: Referring Romone Shaff: Treating Sasan Wilkie/Extender: [name] in Treatment: 6 Active Problems Location of Pain Severity and Description of Pain Patient Has Paino No Site Locations Pain Management and Medication Current Pain Management: Electronic Signature([name]) Signed: 08/28/2022 1:21:58 PM By: Rosalio Loud MSN RN CNS WTA Entered By: Rosalio Loud on 08/27/2022  14:15:11 -------------------------------------------------------------------------------- Patient/Caregiver Education Details Patient Name: Date of Service: [name], [name] 2/1/2024andnbsp2:15 PM Medical Record Number: [id number] Patient Account Number: [id number] Date of Birth/Gender: Treating RN: March 17, 1926 (89 y.o. [name] Primary Care Physician: Frazier Richards Other Clinician: Referring Physician: Treating Physician/Extender: [name] in Treatment: 6 Education Assessment Education Provided To: Patient Education Topics Provided Wound/Skin Impairment: Handouts: Caring for Your Ulcer Methods: Explain/Verbal Responses: State content correctly [name], [name] ([id number]) [id number] Page 8 of 11 Electronic Signature([name]) Signed: 08/28/2022 1:21:58 PM By: Rosalio Loud MSN RN CNS WTA Entered By: Rosalio Loud on 08/27/2022 15:07:07 -------------------------------------------------------------------------------- Wound Assessment Details Patient Name: Date of Service: [name], [name] 08/27/2022 2:15 PM Medical Record Number: [id number] Patient Account Number: [id number] Date of Birth/Sex: Treating RN: Apr 24, 1926 (89 y.o. [name] Primary Care Evren Shankland: Frazier Richards Other Clinician: Referring Gunhild Bautch: Treating [name]/Extender: [name] in Treatment: 6 Wound Status Wound Number: 1 Primary Etiology: Venous Leg Ulcer Wound Location: Left, Lateral Lower Leg Wound Status: Open Wounding Event: Gradually Appeared Comorbid History: Hypertension, Dementia, Received Chemotherapy Date Acquired: 05/27/2022 Weeks Of Treatment: 6 Clustered Wound: No Photos Wound Measurements Length: (cm) 1 Width: (cm) 0.6 Depth: (cm) 0.1 Area: (cm) 0.471 Volume: (cm) 0.047 % Reduction in Area: 98.2% % Reduction in Volume: 99.1% Epithelialization: None Wound Description Classification: Full  Thickness Without Exposed Support Wound Margin: Indistinct, nonvisible Exudate Amount: Large Exudate Type: Serous Exudate Color: amber Structures Foul Odor After Cleansing: No Slough/Fibrino Yes Wound Bed Granulation Amount: Small (1-33%) Exposed Structure Granulation Quality: Red, Friable Fascia Exposed: No Necrotic Amount: Large (67-100%) Fat Layer (Subcutaneous Tissue) Exposed: Yes Necrotic Quality: Adherent Slough Tendon Exposed: No Muscle Exposed: No Joint Exposed: No Bone Exposed: No Treatment Notes [name], [name] ([id number]) [id number] Page 9 of 11 Wound #1 (Lower Leg) Wound Laterality: Left, Lateral Cleanser Soap and Water Discharge Instruction: Gently cleanse wound with antibacterial soap, rinse and pat dry prior to dressing wounds Peri-Wound Care Triamcinolone Acetonide Cream, 0.1%, 15 (g) tube Discharge Instruction: Apply as directed. Topical Primary Dressing Cutimed Sorbact 1.5x 2.38 (in/in) Discharge Instruction: A bacteria- and fungi binding wound dressing, suitable for cavities and fistulas. It is suitable as a wound filler and allows the passage of wound exudate into a secondary dressing. The dressing helps reducing odor and pain and can improve healing. Secondary Dressing ABD Pad 5x9 (in/in) Discharge Instruction: Cover with ABD pad Secured With Compression Wrap 3-LAYER WRAP - Profore Lite LF 3 Multilayer Compression Bandaging System Discharge Instruction: Apply 3 multi-layer wrap as prescribed. If wrap slides more than 1 inch (mark on leg) call wound care for re-wrap. Compression Stockings Add-Ons Electronic Signature([name]) Signed: 08/28/2022 1:21:58 PM By: Rosalio Loud MSN RN CNS WTA Entered By: Rosalio Loud on 08/27/2022 14:24:29 -------------------------------------------------------------------------------- Wound Assessment Details Patient Name: Date of Service: [name], [name] 08/27/2022 2:15 PM Medical Record Number:  [id number] Patient Account Number: [id number] Date of Birth/Sex: Treating RN: 10-30-1925 (89 y.o. [name] Primary Care Azul Coffie: Frazier Richards Other Clinician: Referring Paula Busenbark: Treating Aubrina Nieman/Extender: [name] in Treatment: 6 Wound Status Wound Number: 2 Primary Etiology:  Pressure Ulcer Wound Location: Anterior T Second oe Wound Status: Open Wounding Event: Pressure Injury Comorbid History: Hypertension, Dementia, Received Chemotherapy Date Acquired: 08/27/2022 Weeks Of Treatment: 0 Clustered Wound: No Photos [name], [name] ([id number]) [id number] Page 10 of 11 Wound Measurements Length: (cm) 0.6 Width: (cm) 0.6 Depth: (cm) 0.1 Area: (cm) 0.283 Volume: (cm) 0.028 % Reduction in Area: % Reduction in Volume: Epithelialization: Small (1-33%) Tunneling: No Undermining: No Wound Description Classification: Category/Stage II Exudate Amount: Medium Exudate Type: Serous Exudate Color: amber Foul Odor After Cleansing: No Slough/Fibrino No Wound Bed Granulation Amount: Medium (34-66%) Exposed Structure Granulation Quality: Red, Pink Fascia Exposed: No Necrotic Amount: None Present (0%) Fat Layer (Subcutaneous Tissue) Exposed: No Tendon Exposed: No Muscle Exposed: No Joint Exposed: No Bone Exposed: No Treatment Notes Wound #2 (Toe Second) Wound Laterality: Anterior Cleanser Peri-Wound Care Topical Primary Dressing Non-Adherent Pad 3x8 (in/in) Discharge Instruction: Add to wound bed to alleviate sticking. Secondary Dressing Coverlet Latex-Free Fabric Adhesive Dressings Discharge Instruction: Knuckle Secured With Compression Wrap Compression Stockings Add-Ons Electronic Signature([name]) Signed: 08/28/2022 1:21:58 PM By: Rosalio Loud MSN RN CNS WTA Entered By: Rosalio Loud on 08/27/2022 14:30:42 [name] ([id number]) [id number] Page 11 of  11 -------------------------------------------------------------------------------- Vitals Details Patient Name: Date of Service: [name], [name] 08/27/2022 2:15 PM Medical Record Number: [id number] Patient Account Number: [id number] Date of Birth/Sex: Treating RN: 1926/05/30 (89 y.o. [name] Primary Care Sivan Quast: Frazier Richards Other Clinician: Referring Bernisha Verma: Treating Garwood Wentzell/Extender: [name] in Treatment: 6 Vital Signs Time Taken: 14:12 Temperature (F): 97.7 Weight (lbs): 106 Pulse (bpm): 66 Respiratory Rate (breaths/min): 16 Blood Pressure (mmHg): 141/59 Reference Range: 80 - 120 mg / dl Electronic Signature([name]) Signed: 08/28/2022 1:21:58 PM By: Rosalio Loud MSN RN CNS WTA Entered By: Rosalio Loud on 08/27/2022 14:15:05

## 2022-09-03 ENCOUNTER — Encounter: Payer: Medicare Other | Admitting: Physician Assistant

## 2022-09-03 DIAGNOSIS — I87332 Chronic venous hypertension (idiopathic) with ulcer and inflammation of left lower extremity: Secondary | ICD-10-CM | POA: Diagnosis not present

## 2022-09-03 NOTE — Progress Notes (Addendum)
VERLISA, TACKETT ([id number]) [id number] Page 1 of 8 Visit Report for 09/03/2022 Chief Complaint Document Details Patient Name: Date of Service: [name] Royals, DO RCA S 09/03/2022 1:15 PM Medical Record Number: [id number] Patient Account Number: [id number] Date of Birth/Sex: Treating RN: 04-06-1926 (89 y.o. [name] Primary Care Provider: Frazier Richards Other Clinician: Referring Provider: Treating Provider/Extender: [name] in Treatment: 7 Information Obtained from: Patient Chief Complaint Left lateral LE ulcer Electronic Signature(s) Signed: 09/03/2022 1:07:03 PM By: Worthy Keeler PA-C Entered By: Worthy Keeler on 09/03/2022 13:07:03 -------------------------------------------------------------------------------- Debridement Details Patient Name: Date of Service: [name] MSEY, DO RCA S 09/03/2022 1:15 PM Medical Record Number: [id number] Patient Account Number: [id number] Date of Birth/Sex: Treating RN: [birth year] (89 y.o. [name] Primary Care Provider: Frazier Richards Other Clinician: Referring Provider: Treating Provider/Extender: [name] in Treatment: 7 Debridement Performed for Assessment: Wound #1 Left,Lateral Lower Leg Performed By: Physician Tommie Sams., PA-C Debridement Type: Debridement Severity of Tissue Pre Debridement: Fat layer exposed Level of Consciousness (Pre-procedure): Awake and Alert Pre-procedure Verification/Time Out No Taken: Start Time: 14:04 T Area Debrided (L x W): otal 1.2 (cm) x 0.5 (cm) = 0.6 (cm) Tissue and other material debrided: Non-Viable, Skin: Dermis , Skin: Epidermis Level: Skin/Epidermis Debridement Description: Selective/Open Wound Instrument: Curette Bleeding: None Response to Treatment: Procedure was tolerated well Level of Consciousness (Post- Awake and Alert procedure): Post Debridement Measurements of Total Wound [name]  ([id number]) [id number] Page 2 of 8 Length: (cm) 1.2 Width: (cm) 0.5 Depth: (cm) 0.2 Volume: (cm) 0.094 Character of Wound/Ulcer Post Debridement: Stable Severity of Tissue Post Debridement: Fat layer exposed Post Procedure Diagnosis Same as Pre-procedure Electronic Signature(s) Signed: 09/03/2022 4:05:54 PM By: Rosalio Loud MSN RN CNS WTA Signed: 09/03/2022 6:29:11 PM By: Worthy Keeler PA-C Entered By: Rosalio Loud on 09/03/2022 14:06:13 -------------------------------------------------------------------------------- HPI Details Patient Name: Date of Service: [name] Bufford Spikes, DO RCA S 09/03/2022 1:15 PM Medical Record Number: [id number] Patient Account Number: [id number] Date of Birth/Sex: Treating RN: 1926/04/17 (89 y.o. [name] Primary Care Provider: Frazier Richards Other Clinician: Referring Provider: Treating Provider/Extender: [name] in Treatment: 7 History of Present Illness HPI Description: 07-16-2022 upon evaluation today patient presents for initial inspection here in our clinic concerning a wound on the left lateral lower extremity. This is an area that has been present since around May 27, 2022 according to what the patient tells me today. With that being said she does live in assisted living facility. She is seen with her son in the office today. As far as past medical history is concerned she does have a history of breast cancer in 2003 which she did obviously survive. She also had a right knee replacement. In 2009 she had an IVC filter and heart ablation due to atrial fibrillation. With regard to the remainder of her medical history again she is on anticoagulant therapy at this point due to atrial fibrillation, she has hypertension, and chronic venous insufficiency based on what I am seeing currently. In regard to the wound she is currently on Keflex which should be completed on the 25th of this month. Other  than that she has had various things applied to the wound including different creams but then at other times she also states that she has had nothing put on and been told to just leave it open to air. There is been a lot of confusion about the best treatment course with regard to her  wound which is obviously not good. I discussed with her today that I think we can have the best plan going forward and that that will include keeping this covered but nonetheless it should help it to heal the most effectively as well. 12/29; left lower leg chronic venous insufficiency. She complains fairly bitterly about the tightness of the wrap. She was put on antibiotics last week which I believe is Keflex. She lives in an assisted living 07-31-2021 upon evaluation today patient appears to be doing well currently in regard to her wound. She is actually been tolerating the dressing changes this is measuring smaller and looking better. I think we are on the right track I do believe that the current compression wrap is helping significantly with her Ealing at this point. Overall I do not see any signs of infection locally nor systemically which is great news. No fevers, chills, nausea, vomiting, or diarrhea. 08-13-2022 upon evaluation today patient's wound on her leg actually showing signs of erythema around the edges of the wound that she is also still having quite a bit of drainage all things considered. Fortunately I do not see any evidence of infection systemically though locally I definitely see some evidence here both with erythema, warmth, and drainage. She has been using the silver alginate dressing we have also had an 3 layer compression wrap though she did not feel good felt very tight at the top of the wrap which is the main concern she has today. 1/25; the patient's wound is smaller today. She is on doxycycline. Culture result from last week showed MRSA but fortunately sensitive to doxycycline which she is taking.  She is using TCA Sorbact under 3 layer compression. 08-27-2022 upon evaluation today patient appears to be doing well currently in regard to her left leg ulcer which does not appear to be doing poorly at all in fact this appears to be healing quite nicely. I am extremely pleased with where we stand I do not see any signs of active infection locally nor systemically which is great news. No fevers, chills, nausea, vomiting, or diarrhea. 09-03-2022 upon evaluation patient's wound is actually showing signs of significant improvement I am actually very pleased with where we stand currently. I do not see any signs of active knee flexion at this time which is great news. No fevers, chills, nausea, vomiting, or diarrhea. Electronic Signature(s) Signed: 09/03/2022 6:01:54 PM By: Worthy Keeler PA-C Entered By: Worthy Keeler on 09/03/2022 18:01:54 [name] ([id number]) [id number] Page 3 of 8 -------------------------------------------------------------------------------- Physical Exam Details Patient Name: Date of Service: [name] Royals, DO RCA S 09/03/2022 1:15 PM Medical Record Number: [id number] Patient Account Number: [id number] Date of Birth/Sex: Treating RN: January 05, 1926 (89 y.o. [name] Primary Care Provider: Frazier Richards Other Clinician: Referring Provider: Treating Provider/Extender: [name] in Treatment: 7 Constitutional Well-nourished and well-hydrated in no acute distress. Respiratory normal breathing without difficulty. Psychiatric this patient is able to make decisions and demonstrates good insight into disease process. Alert and Oriented x 3. pleasant and cooperative. Notes Upon inspection patient's wound bed actually showed signs of good granulation epithelization at this point in fact she is pretty much about healed I did perform some debridement clearway some of the necrotic debris she tolerated that today without  complication postdebridement wound bed is significantly improved. Electronic Signature(s) Signed: 09/03/2022 6:02:09 PM By: Worthy Keeler PA-C Entered By: Worthy Keeler on 09/03/2022 18:02:08 -------------------------------------------------------------------------------- Physician Orders Details Patient Name: Date of Service:  [name] MSEY, DO RCA S 09/03/2022 1:15 PM Medical Record Number: [id number] Patient Account Number: [id number] Date of Birth/Sex: Treating RN: February 08, 1926 (89 y.o. [name] Primary Care Provider: Frazier Richards Other Clinician: Referring Provider: Treating Provider/Extender: [name] in Treatment: 7 Verbal / Phone Orders: No Diagnosis Coding ICD-10 Coding Code Description [phone] Chronic venous hypertension (idiopathic) with ulcer and inflammation of left lower extremity L97.822 Non-pressure chronic ulcer of other part of left lower leg with fat layer exposed I10 Essential (primary) hypertension I48.0 Paroxysmal atrial fibrillation Z79.01 Long term (current) use of anticoagulants [name], [name] ([id number]) [id number] Page 4 of 8 Follow-up Appointments Wound #1 Left,Lateral Lower Leg Return Appointment in 1 week. Nurse Visit as needed Bathing/ Shower/ Hygiene May shower with wound dressing protected with water repellent cover or cast protector. - Do not wear slip on shoes without a back. Wear shoes that will support the right foot without slipping Change foam and bandaid on Right 2nd toe 3 x week. T open with compression socks. May put another sock over the toes oes Anesthetic (Use 'Patient Medications' Section for Anesthetic Order Entry) Lidocaine applied to wound bed Edema Control - Lymphedema / Segmental Compressive Device / Other Left Lower Extremity Elevate, Exercise Daily and A void Standing for Long Periods of Time. Elevate legs to the level of the heart and pump ankles as often as  possible Elevate leg(s) parallel to the floor when sitting. Additional Orders / Instructions Follow Nutritious Diet and Increase Protein Intake Medications-Please add to medication list. Other: - TCA to periwound Wound Treatment Wound #1 - Lower Leg Wound Laterality: Left, Lateral Cleanser: Soap and Water 1 x Per Week/30 Days Discharge Instructions: Gently cleanse wound with antibacterial soap, rinse and pat dry prior to dressing wounds Peri-Wound Care: Triamcinolone Acetonide Cream, 0.1%, 15 (g) tube 1 x Per Week/30 Days Discharge Instructions: Apply as directed. Prim Dressing: Cutimed Sorbact 1.5x 2.38 (in/in) 1 x Per Week/30 Days ary Discharge Instructions: A bacteria- and fungi binding wound dressing, suitable for cavities and fistulas. It is suitable as a wound filler and allows the passage of wound exudate into a secondary dressing. The dressing helps reducing odor and pain and can improve healing. Secondary Dressing: ABD Pad 5x9 (in/in) 1 x Per Week/30 Days Discharge Instructions: Cover with ABD pad Compression Wrap: 3-LAYER WRAP - Profore Lite LF 3 Multilayer Compression Bandaging System 1 x Per Week/30 Days Discharge Instructions: Apply 3 multi-layer wrap as prescribed. If wrap slides more than 1 inch (mark on leg) call wound care for re-wrap. Wound #2 - T Second oe Wound Laterality: Anterior Prim Dressing: Non-Adherent Pad 3x8 (in/in) 3 x Per Week/30 Days ary Discharge Instructions: Add to wound bed to alleviate sticking. Secondary Dressing: Coverlet Latex-Free Fabric Adhesive Dressings 3 x Per Week/30 Days Discharge Instructions: Knuckle Electronic Signature(s) Signed: 09/03/2022 4:05:54 PM By: Rosalio Loud MSN RN CNS WTA Signed: 09/03/2022 6:29:11 PM By: Worthy Keeler PA-C Entered By: Rosalio Loud on 09/03/2022 14:09:19 -------------------------------------------------------------------------------- Problem List Details Patient Name: Date of Service: [name] Bufford Spikes, DO RCA S  09/03/2022 1:15 PM Medical Record Number: [id number] Patient Account Number: [id number] Date of Birth/Sex: Treating RN: July 13, 1926 (89 y.o. [name] Primary Care Provider: Frazier Richards Other Clinician: MELANE, BOLL ([id number]) [id number] Page 5 of 8 Referring Provider: Treating Provider/Extender: [name] in Treatment: 7 Active Problems ICD-10 Encounter Code Description Active Date MDM Diagnosis I87.332 Chronic venous hypertension (idiopathic) with ulcer and inflammation of left 07/16/2022 No Yes lower  extremity [phone] Non-pressure chronic ulcer of other part of left lower leg with fat layer exposed12/21/2023 No Yes I10 Essential (primary) hypertension 07/16/2022 No Yes I48.0 Paroxysmal atrial fibrillation 07/16/2022 No Yes Z79.01 Long term (current) use of anticoagulants 07/16/2022 No Yes Inactive Problems Resolved Problems Electronic Signature(s) Signed: 09/03/2022 1:06:55 PM By: Worthy Keeler PA-C Entered By: Worthy Keeler on 09/03/2022 13:06:55 -------------------------------------------------------------------------------- Progress Note Details Patient Name: Date of Service: [name] MSEY, DO RCA S 09/03/2022 1:15 PM Medical Record Number: [id number] Patient Account Number: [id number] Date of Birth/Sex: Treating RN: [birth year] (89 y.o. [name] Primary Care Provider: Frazier Richards Other Clinician: Referring Provider: Treating Provider/Extender: [name] in Treatment: 7 Subjective Chief Complaint Information obtained from Patient Left lateral LE ulcer History of Present Illness (HPI) 07-16-2022 upon evaluation today patient presents for initial inspection here in our clinic concerning a wound on the left lateral lower extremity. This is an area that has been present since around May 27, 2022 according to what the patient tells me today. With that being said she does  live in assisted living facility. She is seen with her son in the office today. As far as past medical history is concerned she does have a history of breast cancer in 2003 which she did obviously survive. She also had a right knee replacement. In 2009 she had an IVC filter and heart ablation due to atrial fibrillation. With regard to the remainder of her medical history again she is on anticoagulant therapy at this point due to atrial fibrillation, she has hypertension, and chronic venous insufficiency based on what I am seeing currently. [name], [name] ([id number]) [id number] Page 6 of 8 In regard to the wound she is currently on Keflex which should be completed on the 25th of this month. Other than that she has had various things applied to the wound including different creams but then at other times she also states that she has had nothing put on and been told to just leave it open to air. There is been a lot of confusion about the best treatment course with regard to her wound which is obviously not good. I discussed with her today that I think we can have the best plan going forward and that that will include keeping this covered but nonetheless it should help it to heal the most effectively as well. 12/29; left lower leg chronic venous insufficiency. She complains fairly bitterly about the tightness of the wrap. She was put on antibiotics last week which I believe is Keflex. She lives in an assisted living 07-31-2021 upon evaluation today patient appears to be doing well currently in regard to her wound. She is actually been tolerating the dressing changes this is measuring smaller and looking better. I think we are on the right track I do believe that the current compression wrap is helping significantly with her Ealing at this point. Overall I do not see any signs of infection locally nor systemically which is great news. No fevers, chills, nausea, vomiting, or  diarrhea. 08-13-2022 upon evaluation today patient's wound on her leg actually showing signs of erythema around the edges of the wound that she is also still having quite a bit of drainage all things considered. Fortunately I do not see any evidence of infection systemically though locally I definitely see some evidence here both with erythema, warmth, and drainage. She has been using the silver alginate dressing we have also had an 3 layer compression wrap though she  did not feel good felt very tight at the top of the wrap which is the main concern she has today. 1/25; the patient's wound is smaller today. She is on doxycycline. Culture result from last week showed MRSA but fortunately sensitive to doxycycline which she is taking. She is using TCA Sorbact under 3 layer compression. 08-27-2022 upon evaluation today patient appears to be doing well currently in regard to her left leg ulcer which does not appear to be doing poorly at all in fact this appears to be healing quite nicely. I am extremely pleased with where we stand I do not see any signs of active infection locally nor systemically which is great news. No fevers, chills, nausea, vomiting, or diarrhea. 09-03-2022 upon evaluation patient's wound is actually showing signs of significant improvement I am actually very pleased with where we stand currently. I do not see any signs of active knee flexion at this time which is great news. No fevers, chills, nausea, vomiting, or diarrhea. Objective Constitutional Well-nourished and well-hydrated in no acute distress. Vitals Time Taken: 1:37 PM, Weight: 106 lbs, Temperature: 97.5 F, Pulse: 67 bpm, Respiratory Rate: 16 breaths/min, Blood Pressure: 138/74 mmHg. Respiratory normal breathing without difficulty. Psychiatric this patient is able to make decisions and demonstrates good insight into disease process. Alert and Oriented x 3. pleasant and cooperative. General Notes: Upon inspection patient's  wound bed actually showed signs of good granulation epithelization at this point in fact she is pretty much about healed I did perform some debridement clearway some of the necrotic debris she tolerated that today without complication postdebridement wound bed is significantly improved. Integumentary (Hair, Skin) Wound #1 status is Open. Original cause of wound was Gradually Appeared. The date acquired was: 05/27/2022. The wound has been in treatment 7 weeks. The wound is located on the Left,Lateral Lower Leg. The wound measures 1.2cm length x 0.5cm width x 0.1cm depth; 0.471cm^2 area and 0.047cm^3 volume. There is Fat Layer (Subcutaneous Tissue) exposed. There is a large amount of serous drainage noted. The wound margin is indistinct and nonvisible. There is small (1-33%) red, friable granulation within the wound bed. There is a large (67-100%) amount of necrotic tissue within the wound bed including Adherent Slough. Wound #2 status is Open. Original cause of wound was Pressure Injury. The date acquired was: 08/27/2022. The wound has been in treatment 1 weeks. The wound is located on the Anterior T Second. The wound measures 0.2cm length x 0.2cm width x 0.1cm depth; 0.031cm^2 area and 0.003cm^3 volume. There is oe a medium amount of serous drainage noted. There is medium (34-66%) red, pink granulation within the wound bed. There is no necrotic tissue within the wound bed. Assessment Active Problems ICD-10 Chronic venous hypertension (idiopathic) with ulcer and inflammation of left lower extremity Non-pressure chronic ulcer of other part of left lower leg with fat layer exposed Essential (primary) hypertension Paroxysmal atrial fibrillation Long term (current) use of anticoagulants Procedures Wound #1 Pre-procedure diagnosis of Wound #1 is a Venous Leg Ulcer located on the Left,Lateral Lower Leg .Severity of Tissue Pre Debridement is: Fat layer exposed. [name], [name] ([id number])  [id number] Page 7 of 8 There was a Selective/Open Wound Skin/Epidermis Debridement with a total area of 0.6 sq cm performed by Tommie Sams., PA-C. With the following instrument(s): Curette to remove Non-Viable tissue/material. Material removed includes Skin: Dermis and Skin: Epidermis and. No specimens were taken.There was no bleeding. The procedure was tolerated well. Post Debridement Measurements: 1.2cm length x 0.5cm width  x 0.2cm depth; 0.094cm^3 volume. Character of Wound/Ulcer Post Debridement is stable. Severity of Tissue Post Debridement is: Fat layer exposed. Post procedure Diagnosis Wound #1: Same as Pre-Procedure Pre-procedure diagnosis of Wound #1 is a Venous Leg Ulcer located on the Left,Lateral Lower Leg . There was a Three Layer Compression Therapy Procedure by Rosalio Loud, RN. Post procedure Diagnosis Wound #1: Same as Pre-Procedure Plan Follow-up Appointments: Wound #1 Left,Lateral Lower Leg: Return Appointment in 1 week. Nurse Visit as needed Bathing/ Shower/ Hygiene: May shower with wound dressing protected with water repellent cover or cast protector. - Do not wear slip on shoes without a back. Wear shoes that will support the right foot without slipping Change foam and bandaid on Right 2nd toe 3 x week. T open with compression socks. May put another sock over the toes oes Anesthetic (Use 'Patient Medications' Section for Anesthetic Order Entry): Lidocaine applied to wound bed Edema Control - Lymphedema / Segmental Compressive Device / Other: Elevate, Exercise Daily and Avoid Standing for Long Periods of Time. Elevate legs to the level of the heart and pump ankles as often as possible Elevate leg(s) parallel to the floor when sitting. Additional Orders / Instructions: Follow Nutritious Diet and Increase Protein Intake Medications-Please add to medication list.: Other: - TCA to periwound WOUND #1: - Lower Leg Wound Laterality: Left,  Lateral Cleanser: Soap and Water 1 x Per Week/30 Days Discharge Instructions: Gently cleanse wound with antibacterial soap, rinse and pat dry prior to dressing wounds Peri-Wound Care: Triamcinolone Acetonide Cream, 0.1%, 15 (g) tube 1 x Per Week/30 Days Discharge Instructions: Apply as directed. Prim Dressing: Cutimed Sorbact 1.5x 2.38 (in/in) 1 x Per Week/30 Days ary Discharge Instructions: A bacteria- and fungi binding wound dressing, suitable for cavities and fistulas. It is suitable as a wound filler and allows the passage of wound exudate into a secondary dressing. The dressing helps reducing odor and pain and can improve healing. Secondary Dressing: ABD Pad 5x9 (in/in) 1 x Per Week/30 Days Discharge Instructions: Cover with ABD pad Com pression Wrap: 3-LAYER WRAP - Profore Lite LF 3 Multilayer Compression Bandaging System 1 x Per Week/30 Days Discharge Instructions: Apply 3 multi-layer wrap as prescribed. If wrap slides more than 1 inch (mark on leg) call wound care for re-wrap. WOUND #2: - T Second Wound Laterality: Anterior oe Prim Dressing: Non-Adherent Pad 3x8 (in/in) 3 x Per Week/30 Days ary Discharge Instructions: Add to wound bed to alleviate sticking. Secondary Dressing: Coverlet Latex-Free Fabric Adhesive Dressings 3 x Per Week/30 Days Discharge Instructions: Knuckle 1. I am going to suggest that we have the patient continue to monitor for any signs of infection or worsening based on what I am seeing I think that we are on the right track here, recommend we continue with the triamcinolone I am also can recommend that we continue with the Sorbact followed by the 3 layer compression wrap which actually seems to be doing quite well. 2. I am also going to recommend regard to the toe she continue with a coverlet using a foam dressing as well in order to protect this area I think this is to be of utmost importance. We will see patient back for reevaluation in 1 week here in the  clinic. If anything worsens or changes patient will contact our office for additional recommendations. Electronic Signature(s) Signed: 09/03/2022 6:02:52 PM By: Worthy Keeler PA-C Entered By: Worthy Keeler on 09/03/2022 18:02:51 SuperBill Details -------------------------------------------------------------------------------- [name] ([id number]) [id number] Page 8 of 8  Patient Name: Date of Service: [name] Royals, DO RCA S 09/03/2022 Medical Record Number: [id number] Patient Account Number: [id number] Date of Birth/Sex: Treating RN: September 16, 1925 (89 y.o. [name] Primary Care Provider: Frazier Richards Other Clinician: Referring Provider: Treating Provider/Extender: [name] in Treatment: 7 Diagnosis Coding ICD-10 Codes Code Description [phone] Chronic venous hypertension (idiopathic) with ulcer and inflammation of left lower extremity L97.822 Non-pressure chronic ulcer of other part of left lower leg with fat layer exposed I10 Essential (primary) hypertension I48.0 Paroxysmal atrial fibrillation Z79.01 Long term (current) use of anticoagulants Facility Procedures CPT4 Code Description Modifier Quantity [id number] - DEBRIDE WOUND 1ST 20 SQ CM OR < 1 ICD-10 Diagnosis Description L97.822 Non-pressure chronic ulcer of other part of left lower leg with fat layer exposed Physician Procedures Quantity CPT4 Code Description Modifier [id number] - WC PHYS DEBR WO ANESTH 20 SQ CM 1 ICD-10 Diagnosis Description L97.822 Non-pressure chronic ulcer of other part of left lower leg with fat layer exposed Electronic Signature(s) Signed: 09/03/2022 6:03:06 PM By: Worthy Keeler PA-C Entered By: Worthy Keeler on 09/03/2022 18:03:06

## 2022-09-03 NOTE — Progress Notes (Addendum)
QUAMESHA, [name] ([id number]) [id number] Page 1 of 11 Visit Report for 09/03/2022 Arrival Information Details Patient Name: Date of Service: [name] Royals, DO RCA S 09/03/2022 1:15 PM Medical Record Number: [id number] Patient Account Number: [id number] Date of Birth/Sex: Treating RN: August 20, 1925 (89 y.o. [name] Primary Care Marinell Igarashi: Frazier Richards Other Clinician: Referring Finnick Orosz: Treating Meeah Totino/Extender: [name] in Treatment: 7 Visit Information History Since Last Visit Added or deleted any medications: No Patient Arrived: Gilford Rile Any new allergies or adverse reactions: No Arrival Time: 13:34 Had a fall or experienced change in No Accompanied By: son activities of daily living that may affect Transfer Assistance: None risk of falls: Patient Identification Verified: Yes Signs or symptoms of abuse/neglect since last visito No Secondary Verification Process Completed: Yes Hospitalized since last visit: No Patient Requires Transmission-Based Precautions: No Has Dressing in Place as Prescribed: Yes Patient Has Alerts: Yes Pain Present Now: No Patient Alerts: Patient on Blood Thinner NOT DIABETIC Eliquis Electronic Signature(s) Signed: 09/03/2022 4:05:54 PM By: Rosalio Loud MSN RN CNS WTA Entered By: Rosalio Loud on 09/03/2022 13:37:14 -------------------------------------------------------------------------------- Clinic Level of Care Assessment Details Patient Name: Date of Service: [name] MSEY, DO RCA S 09/03/2022 1:15 PM Medical Record Number: [id number] Patient Account Number: [id number] Date of Birth/Sex: Treating RN: 03-03-1926 (89 y.o. [name] Primary Care Antha Niday: Frazier Richards Other Clinician: Referring Jonea Bukowski: Treating Teyon Odette/Extender: [name] in Treatment: 7 Clinic Level of Care Assessment Items TOOL 1 Quantity Score []$  - 0 Use when EandM and Procedure is  performed on INITIAL visit ASSESSMENTS - Nursing Assessment / Reassessment []$  - 0 General Physical Exam (combine w/ comprehensive assessment (listed just below) when performed on new pt. evals) []$  - 0 Comprehensive Assessment (HX, ROS, Risk Assessments, Wounds Hx, etc.) ASSESSMENTS - Wound and Skin Assessment / Reassessment []$  - 0 Dermatologic / Skin Assessment (not related to wound area) [name], [name] ([id number]) [id number] Page 2 of 11 ASSESSMENTS - Ostomy and/or Continence Assessment and Care []$  - 0 Incontinence Assessment and Management []$  - 0 Ostomy Care Assessment and Management (repouching, etc.) PROCESS - Coordination of Care []$  - 0 Simple Patient / Family Education for ongoing care []$  - 0 Complex (extensive) Patient / Family Education for ongoing care []$  - 0 Staff obtains Programmer, systems, Records, T Results / Process Orders est []$  - 0 Staff telephones HHA, Nursing Homes / Clarify orders / etc []$  - 0 Routine Transfer to another Facility (non-emergent condition) []$  - 0 Routine Hospital Admission (non-emergent condition) []$  - 0 New Admissions / Biomedical engineer / Ordering NPWT Apligraf, etc. , []$  - 0 Emergency Hospital Admission (emergent condition) PROCESS - Special Needs []$  - 0 Pediatric / Minor Patient Management []$  - 0 Isolation Patient Management []$  - 0 Hearing / Language / Visual special needs []$  - 0 Assessment of Community assistance (transportation, D/C planning, etc.) []$  - 0 Additional assistance / Altered mentation []$  - 0 Support Surface(s) Assessment (bed, cushion, seat, etc.) INTERVENTIONS - Miscellaneous []$  - 0 External ear exam []$  - 0 Patient Transfer (multiple staff / Civil Service fast streamer / Similar devices) []$  - 0 Simple Staple / Suture removal (25 or less) []$  - 0 Complex Staple / Suture removal (26 or more) []$  - 0 Hypo/Hyperglycemic Management (do not check if billed separately) []$  - 0 Ankle / Brachial Index (ABI) -  do not check if billed separately Has the patient been seen at the hospital within the last three years: Yes Total Score: 0 Level Of  Care: ____ Electronic Signature(s) Signed: 09/03/2022 4:05:54 PM By: Rosalio Loud MSN RN CNS WTA Entered By: Rosalio Loud on 09/03/2022 14:08:16 -------------------------------------------------------------------------------- Compression Therapy Details Patient Name: Date of Service: [name] Royals, DO RCA S 09/03/2022 1:15 PM Medical Record Number: [id number] Patient Account Number: [id number] Date of Birth/Sex: Treating RN: 1926/06/05 (89 y.o. [name] Primary Care Lilo Wallington: Frazier Richards Other Clinician: Referring [name]: Treating Guss Farruggia/Extender: [name] in Treatment: 7 Compression Therapy Performed for Wound Assessment: Wound #1 Left,Lateral Lower Leg Performed By: Leeanne Deed, RN [place], [name] ([id number]) [id number] Page 3 of 11 Compression Type: Three Layer Post Procedure Diagnosis Same as Pre-procedure Electronic Signature(s) Signed: 09/03/2022 4:29:15 PM By: Rosalio Loud MSN RN CNS WTA Entered By: Rosalio Loud on 09/03/2022 16:29:15 -------------------------------------------------------------------------------- Encounter Discharge Information Details Patient Name: Date of Service: [name] Bufford Spikes, DO RCA S 09/03/2022 1:15 PM Medical Record Number: [id number] Patient Account Number: [id number] Date of Birth/Sex: Treating RN: January 18, 1926 (89 y.o. [name] Primary Care Amnah Breuer: Frazier Richards Other Clinician: Referring Tatumn Corbridge: Treating Ovetta Bazzano/Extender: [name] in Treatment: 7 Encounter Discharge Information Items Post Procedure Vitals Discharge Condition: Stable Temperature (F): 97.5 Ambulatory Status: Walker Pulse (bpm): 67 Discharge Destination: Home Respiratory Rate (breaths/min): 16 Transportation: Private Auto Blood Pressure  (mmHg): 138/74 Accompanied By: son Schedule Follow-up Appointment: Yes Clinical Summary of Care: Electronic Signature(s) Signed: 09/03/2022 4:30:45 PM By: Rosalio Loud MSN RN CNS WTA Previous Signature: 09/03/2022 4:05:54 PM Version By: Rosalio Loud MSN RN CNS WTA Entered By: Rosalio Loud on 09/03/2022 16:30:45 -------------------------------------------------------------------------------- Lower Extremity Assessment Details Patient Name: Date of Service: [name] MSEY, DO RCA S 09/03/2022 1:15 PM Medical Record Number: [id number] Patient Account Number: [id number] Date of Birth/Sex: Treating RN: October 09, 1925 (89 y.o. [name] Primary Care Javoni Lucken: Frazier Richards Other Clinician: Referring Jil Penland: Treating Raykwon Hobbs/Extender: Elvina Sidle Weeks in Treatment: 7 Edema Assessment Assessed: [Left: No] Patrice Paradise: No] [Left: Edema] [Right: :] R[LeftAlpha Gula [id number] [[id number] Page 4 of 11] Calf Left: Right: Point of Measurement: 30 cm From Medial Instep 31.7 cm Ankle Left: Right: Point of Measurement: 12 cm From Medial Instep 18.7 cm Vascular Assessment Pulses: Dorsalis Pedis Palpable: [Left:Yes] Electronic Signature(s) Signed: 09/03/2022 4:05:54 PM By: Rosalio Loud MSN RN CNS WTA Entered By: Rosalio Loud on 09/03/2022 13:48:53 -------------------------------------------------------------------------------- Multi Wound Chart Details Patient Name: Date of Service: [name] Royals, DO RCA S 09/03/2022 1:15 PM Medical Record Number: [id number] Patient Account Number: [id number] Date of Birth/Sex: Treating RN: [birth year] (89 y.o. [name] Primary Care Bradlee Heitman: Frazier Richards Other Clinician: Referring Anahla Bevis: Treating Mehki Klumpp/Extender: [name] in Treatment: 7 Vital Signs Height(in): Pulse(bpm): 64 Weight(lbs): 106 Blood Pressure(mmHg): 138/74 Body Mass Index(BMI): Temperature(F):  97.5 Respiratory Rate(breaths/min): 16 [1:Photos:] [N/A:N/A] Left, Lateral Lower Leg Anterior T Second oe N/A Wound Location: Gradually Appeared Pressure Injury N/A Wounding Event: Venous Leg Ulcer Pressure Ulcer N/A Primary Etiology: Hypertension, Dementia, Received Hypertension, Dementia, Received N/A Comorbid History: Chemotherapy Chemotherapy 05/27/2022 08/27/2022 N/A Date Acquired: 7 1 N/A Weeks of Treatment: Open Open N/A Wound Status: No No N/A Wound Recurrence: 1.2x0.5x0.1 0.2x0.2x0.1 N/A Measurements L x W x D (cm) 0.471 0.031 N/A A (cm) : rea 0.047 0.003 N/A Volume (cm) : 98.20% 89.00% N/A % Reduction in Area: 99.10% 89.30% N/A % Reduction in Volume: Full Thickness Without Exposed Category/Stage II N/A ClassificationCHEMEKA, YARBOUGH ([id number]) [phone].pdf Page 5 of 11 Support Structures Large Medium N/A Exudate Amount: Serous Serous N/A Exudate Type: amber amber N/A Exudate  Color: Indistinct, nonvisible N/A N/A Wound Margin: Small (1-33%) Medium (34-66%) N/A Granulation Amount: Red, Friable Red, Pink N/A Granulation Quality: Large (67-100%) None Present (0%) N/A Necrotic Amount: Fat Layer (Subcutaneous Tissue): Yes Fascia: No N/A Exposed Structures: Fascia: No Fat Layer (Subcutaneous Tissue): No Tendon: No Tendon: No Muscle: No Muscle: No Joint: No Joint: No Bone: No Bone: No None Small (1-33%) N/A Epithelialization: Treatment Notes Electronic Signature(s) Signed: 09/03/2022 4:05:54 PM By: Rosalio Loud MSN RN CNS WTA Entered By: Rosalio Loud on 09/03/2022 14:04:23 -------------------------------------------------------------------------------- Multi-Disciplinary Care Plan Details Patient Name: Date of Service: [name] Royals, DO RCA S 09/03/2022 1:15 PM Medical Record Number: [id number] Patient Account Number: [id number] Date of Birth/Sex: Treating RN: [birth year] (89 y.o. [name] Primary Care Peg Fifer: Frazier Richards Other Clinician: Referring Eugen Jeansonne: Treating Tayen Narang/Extender: [name] in Treatment: 7 Active Inactive Pain, Acute or Chronic Nursing Diagnoses: Pain Management - Non-cyclic Acute (Procedural) Potential alteration in comfort, pain Goals: Patient will verbalize adequate pain control and receive pain control interventions during procedures as needed Date Initiated: 07/16/2022 Target Resolution Date: 08/13/2022 Goal Status: Active Patient/caregiver will verbalize adequate pain control between visits Date Initiated: 07/16/2022 Target Resolution Date: 08/13/2022 Goal Status: Active Patient/caregiver will verbalize comfort level met Date Initiated: 07/16/2022 Target Resolution Date: 08/13/2022 Goal Status: Active Interventions: Reposition patient for comfort Treatment Activities: Administer pain control measures as ordered : 07/16/2022 Notes: Venous Leg Ulcer Nursing Diagnoses: KRISTYN, LIAW ([id number]) [phone].pdf Page 6 of 11 Knowledge deficit related to disease process and management Potential for venous Insuffiency (use before diagnosis confirmed) Goals: Patient will maintain optimal edema control Date Initiated: 07/16/2022 Target Resolution Date: 08/13/2022 Goal Status: Active Patient/caregiver will verbalize understanding of disease process and disease management Date Initiated: 07/16/2022 Target Resolution Date: 08/13/2022 Goal Status: Active Interventions: Assess peripheral edema status every visit. Compression as ordered Notes: Wound/Skin Impairment Nursing Diagnoses: Impaired tissue integrity Knowledge deficit related to smoking impact on wound healing Knowledge deficit related to ulceration/compromised skin integrity Goals: Patient/caregiver will verbalize understanding of skin care regimen Date Initiated: 07/16/2022 Target Resolution Date: 08/13/2022 Goal Status: Active Ulcer/skin breakdown will  have a volume reduction of 30% by week 4 Date Initiated: 07/16/2022 Date Inactivated: 08/13/2022 Target Resolution Date: 08/13/2022 Goal Status: Met Ulcer/skin breakdown will have a volume reduction of 50% by week 8 Date Initiated: 08/13/2022 Target Resolution Date: 09/10/2022 Goal Status: Active Interventions: Assess patient/caregiver ability to obtain necessary supplies Assess patient/caregiver ability to perform ulcer/skin care regimen upon admission and as needed Assess ulceration(s) every visit Provide education on ulcer and skin care Treatment Activities: Skin care regimen initiated : 07/16/2022 Topical wound management initiated : 07/16/2022 Notes: Electronic Signature(s) Signed: 09/03/2022 1:54:28 PM By: Rosalio Loud MSN RN CNS WTA Entered By: Rosalio Loud on 09/03/2022 13:54:28 -------------------------------------------------------------------------------- Pain Assessment Details Patient Name: Date of Service: [name] Bufford Spikes, DO RCA S 09/03/2022 1:15 PM Medical Record Number: [id number] Patient Account Number: [id number] Date of Birth/Sex: Treating RN: [birth year] (89 y.o. [name] Primary Care Calynn Ferrero: Frazier Richards Other Clinician: Referring Oskar Cretella: Treating Meron Bocchino/Extender: [name] in Treatment: [address] [place], [place] ([id number]) [id number] Page 7 of 11 Location of Pain Severity and Description of Pain Patient Has Paino No Site Locations Pain Management and Medication Current Pain Management: Electronic Signature(s) Signed: 09/03/2022 4:05:54 PM By: Rosalio Loud MSN RN CNS WTA Entered By: Rosalio Loud on 09/03/2022 13:39:36 -------------------------------------------------------------------------------- Patient/Caregiver Education Details Patient Name: Date of Service: [name] MSEY, DO RCA S 2/8/2024andnbsp1:15 PM Medical Record Number:  [id number] Patient Account Number: [id number] Date of  Birth/Gender: Treating RN: [birth year] (89 y.o. [name] Primary Care Physician: Frazier Richards Other Clinician: Referring Physician: Treating Physician/Extender: [name] in Treatment: 7 Education Assessment Education Provided To: Patient and Caregiver Education Topics Provided Wound/Skin Impairment: Handouts: Caring for Your Ulcer Methods: Explain/Verbal Responses: State content correctly Electronic Signature(s) Signed: 09/03/2022 4:05:54 PM By: Rosalio Loud MSN RN CNS WTA Entered By: Rosalio Loud on 09/03/2022 13:54:21 [name] ([id number] Page 8 of 11 -------------------------------------------------------------------------------- Wound Assessment Details Patient Name: Date of Service: [name] Royals, DO RCA S 09/03/2022 1:15 PM Medical Record Number: [id number] Patient Account Number: [id number] Date of Birth/Sex: Treating RN: 07-07-1926 (89 y.o. [name] Primary Care Toluwanimi Radebaugh: Frazier Richards Other Clinician: Referring Ishana Blades: Treating Arionne Iams/Extender: [name] in Treatment: 7 Wound Status Wound Number: 1 Primary Etiology: Venous Leg Ulcer Wound Location: Left, Lateral Lower Leg Wound Status: Open Wounding Event: Gradually Appeared Comorbid History: Hypertension, Dementia, Received Chemotherapy Date Acquired: 05/27/2022 Weeks Of Treatment: 7 Clustered Wound: No Photos Wound Measurements Length: (cm) 1.2 Width: (cm) 0.5 Depth: (cm) 0.1 Area: (cm) 0.471 Volume: (cm) 0.047 % Reduction in Area: 98.2% % Reduction in Volume: 99.1% Epithelialization: None Wound Description Classification: Full Thickness Without Exposed Support Wound Margin: Indistinct, nonvisible Exudate Amount: Large Exudate Type: Serous Exudate Color: amber Structures Foul Odor After Cleansing: No Slough/Fibrino Yes Wound Bed Granulation Amount: Small (1-33%) Exposed  Structure Granulation Quality: Red, Friable Fascia Exposed: No Necrotic Amount: Large (67-100%) Fat Layer (Subcutaneous Tissue) Exposed: Yes Necrotic Quality: Adherent Slough Tendon Exposed: No Muscle Exposed: No Joint Exposed: No Bone Exposed: No Treatment Notes Wound #1 (Lower Leg) Wound Laterality: Left, Lateral Cleanser Soap and Water Discharge Instruction: Gently cleanse wound with antibacterial soap, rinse and pat dry prior to dressing wounds [place], [name] ([id number]) [phone].pdf Page 9 of 11 Peri-Wound Care Triamcinolone Acetonide Cream, 0.1%, 15 (g) tube Discharge Instruction: Apply as directed. Topical Primary Dressing Cutimed Sorbact 1.5x 2.38 (in/in) Discharge Instruction: A bacteria- and fungi binding wound dressing, suitable for cavities and fistulas. It is suitable as a wound filler and allows the passage of wound exudate into a secondary dressing. The dressing helps reducing odor and pain and can improve healing. Secondary Dressing ABD Pad 5x9 (in/in) Discharge Instruction: Cover with ABD pad Secured With Compression Wrap 3-LAYER WRAP - Profore Lite LF 3 Multilayer Compression Bandaging System Discharge Instruction: Apply 3 multi-layer wrap as prescribed. If wrap slides more than 1 inch (mark on leg) call wound care for re-wrap. Compression Stockings Add-Ons Electronic Signature(s) Signed: 09/03/2022 4:05:54 PM By: Rosalio Loud MSN RN CNS WTA Entered By: Rosalio Loud on 09/03/2022 13:47:07 -------------------------------------------------------------------------------- Wound Assessment Details Patient Name: Date of Service: [name] Bufford Spikes, DO RCA S 09/03/2022 1:15 PM Medical Record Number: [id number] Patient Account Number: [id number] Date of Birth/Sex: Treating RN: 1926-07-17 (89 y.o. [name] Primary Care Zackarey Holleman: Frazier Richards Other Clinician: Referring Taite Schoeppner: Treating Yan Okray/Extender: [name] in Treatment: 7 Wound Status Wound Number: 2 Primary Etiology: Pressure Ulcer Wound Location: Anterior T Second oe Wound Status: Open Wounding Event: Pressure Injury Comorbid History: Hypertension, Dementia, Received Chemotherapy Date Acquired: 08/27/2022 Weeks Of Treatment: 1 Clustered Wound: No Photos Wound Measurements ADAYSIA, FAIDLEY ([id number]) Length: (cm) 0.2 Width: (cm) 0.2 Depth: (cm) 0.1 Area: (cm) 0.031 Volume: (cm) 0.003 [id number] Page 10 of 11 % Reduction in Area: 89% % Reduction in Volume: 89.3% Epithelialization: Small (1-33%) Wound Description Classification: Category/Stage II Exudate Amount: Medium  Exudate Type: Serous Exudate Color: amber Foul Odor After Cleansing: No Slough/Fibrino No Wound Bed Granulation Amount: Medium (34-66%) Exposed Structure Granulation Quality: Red, Pink Fascia Exposed: No Necrotic Amount: None Present (0%) Fat Layer (Subcutaneous Tissue) Exposed: No Tendon Exposed: No Muscle Exposed: No Joint Exposed: No Bone Exposed: No Treatment Notes Wound #2 (Toe Second) Wound Laterality: Anterior Cleanser Peri-Wound Care Topical Primary Dressing Non-Adherent Pad 3x8 (in/in) Discharge Instruction: Add to wound bed to alleviate sticking. Secondary Dressing Coverlet Latex-Free Fabric Adhesive Dressings Discharge Instruction: Knuckle Secured With Compression Wrap Compression Stockings Add-Ons Electronic Signature(s) Signed: 09/03/2022 4:05:54 PM By: Rosalio Loud MSN RN CNS WTA Entered By: Rosalio Loud on 09/03/2022 13:47:41 -------------------------------------------------------------------------------- [place] Details Patient Name: Date of Service: [name] Bufford Spikes, DO RCA S 09/03/2022 1:15 PM Medical Record Number: [id number] Patient Account Number: [id number] Date of Birth/Sex: Treating RN: 1926-03-19 (89 y.o. [name] Primary Care Alban Marucci: Frazier Richards Other Clinician: Referring  Telly Broberg: Treating Raquan Iannone/Extender: [name] in Treatment: 7 Vital Signs Time Taken: 13:37 Temperature (F): 97.5 [place], Natallia ([id number]) [id number] Page 11 of 11 Weight (lbs): 106 Pulse (bpm): 67 Respiratory Rate (breaths/min): 16 Blood Pressure (mmHg): 138/74 Reference Range: 80 - 120 mg / dl Electronic Signature(s) Signed: 09/03/2022 4:05:54 PM By: Rosalio Loud MSN RN CNS WTA Entered By: Rosalio Loud on 09/03/2022 13:38:59

## 2022-09-10 ENCOUNTER — Encounter: Payer: Medicare Other | Admitting: Physician Assistant

## 2022-09-10 DIAGNOSIS — I87332 Chronic venous hypertension (idiopathic) with ulcer and inflammation of left lower extremity: Secondary | ICD-10-CM | POA: Diagnosis not present

## 2022-09-11 NOTE — Progress Notes (Signed)
SAVON, GIUFFRIDA ([id number]) [id number] Page 1 of 6 Visit Report for 09/10/2022 Chief Complaint Document Details Patient Name: Date of Service: [name] Royals, DO RCA S 09/10/2022 1:15 PM Medical Record Number: [id number] Patient Account Number: [id number] Date of Birth/Sex: Treating RN: 03/16/1926 (89 y.o. [name] Primary Care Provider: Frazier Richards Other Clinician: Referring Provider: Treating Provider/Extender: [name] in Treatment: 8 Information Obtained from: Patient Chief Complaint Left lateral LE ulcer Electronic Signature(s) Signed: 09/10/2022 1:56:16 PM By: Worthy Keeler PA-C Entered By: Worthy Keeler on 09/10/2022 13:56:16 -------------------------------------------------------------------------------- HPI Details Patient Name: Date of Service: [name] MSEY, DO RCA S 09/10/2022 1:15 PM Medical Record Number: [id number] Patient Account Number: [id number] Date of Birth/Sex: Treating RN: 1926-02-25 (89 y.o. [name] Primary Care Provider: Frazier Richards Other Clinician: Referring Provider: Treating Provider/Extender: [name] in Treatment: 8 History of Present Illness HPI Description: 07-16-2022 upon evaluation today patient presents for initial inspection here in our clinic concerning a wound on the left lateral lower extremity. This is an area that has been present since around May 27, 2022 according to what the patient tells me today. With that being said she does live in assisted living facility. She is seen with her son in the office today. As far as past medical history is concerned she does have a history of breast cancer in 2003 which she did obviously survive. She also had a right knee replacement. In 2009 she had an IVC filter and heart ablation due to atrial fibrillation. With regard to the remainder of her medical history again she is on anticoagulant therapy at this  point due to atrial fibrillation, she has hypertension, and chronic venous insufficiency based on what I am seeing currently. In regard to the wound she is currently on Keflex which should be completed on the 25th of this month. Other than that she has had various things applied to the wound including different creams but then at other times she also states that she has had nothing put on and been told to just leave it open to air. There is been a lot of confusion about the best treatment course with regard to her wound which is obviously not good. I discussed with her today that I think we can have the best plan going forward and that that will include keeping this covered but nonetheless it should help it to heal the most effectively as well. 12/29; left lower leg chronic venous insufficiency. She complains fairly bitterly about the tightness of the wrap. She was put on antibiotics last week which I believe is Keflex. She lives in an assisted living 07-31-2021 upon evaluation today patient appears to be doing well currently in regard to her wound. She is actually been tolerating the dressing changes this is measuring smaller and looking better. I think we are on the right track I do believe that the current compression wrap is helping significantly with her [facility] ([id number]) [id number] Page 2 of 6 this point. Overall I do not see any signs of infection locally nor systemically which is great news. No fevers, chills, nausea, vomiting, or diarrhea. 08-13-2022 upon evaluation today patient's wound on her leg actually showing signs of erythema around the edges of the wound that she is also still having quite a bit of drainage all things considered. Fortunately I do not see any evidence of infection systemically though locally I definitely see some evidence here both with erythema, warmth, and drainage.  She has been using the silver alginate dressing we have  also had an 3 layer compression wrap though she did not feel good felt very tight at the top of the wrap which is the main concern she has today. 1/25; the patient's wound is smaller today. She is on doxycycline. Culture result from last week showed MRSA but fortunately sensitive to doxycycline which she is taking. She is using TCA Sorbact under 3 layer compression. 08-27-2022 upon evaluation today patient appears to be doing well currently in regard to her left leg ulcer which does not appear to be doing poorly at all in fact this appears to be healing quite nicely. I am extremely pleased with where we stand I do not see any signs of active infection locally nor systemically which is great news. No fevers, chills, nausea, vomiting, or diarrhea. 09-03-2022 upon evaluation patient's wound is actually showing signs of significant improvement I am actually very pleased with where we stand currently. I do not see any signs of active knee flexion at this time which is great news. No fevers, chills, nausea, vomiting, or diarrhea. 09-10-2022 upon evaluation today patient appears to be doing well currently in regard to her wounds. Both the toe as well as the leg appear to be showing signs of good improvement. Fortunately I do not see any evidence of active infection at this time which is great news. She does have her compression socks. Electronic Signature(s) Signed: 09/10/2022 2:31:29 PM By: Worthy Keeler PA-C Entered By: Worthy Keeler on 09/10/2022 14:31:29 -------------------------------------------------------------------------------- Physical Exam Details Patient Name: Date of Service: [name] MSEY, DO RCA S 09/10/2022 1:15 PM Medical Record Number: [id number] Patient Account Number: [id number] Date of Birth/Sex: Treating RN: 1926/05/25 (89 y.o. [name] Primary Care Provider: Frazier Richards Other Clinician: Referring Provider: Treating Provider/Extender: [name]  in Treatment: 8 Constitutional Well-nourished and well-hydrated in no acute distress. Respiratory normal breathing without difficulty. Psychiatric this patient is able to make decisions and demonstrates good insight into disease process. Alert and Oriented x 3. pleasant and cooperative. Notes Upon inspection patient's wound bed actually showed signs of good epithelization at this point. Fortunately I do not see any evidence of infection at this time which is great news. No fevers, chills, nausea, vomiting, or diarrhea. Electronic Signature(s) Signed: 09/10/2022 2:32:04 PM By: Worthy Keeler PA-C Entered By: Worthy Keeler on 09/10/2022 14:32:03 [name] ([id number] Page 3 of 6 -------------------------------------------------------------------------------- Physician Orders Details Patient Name: Date of Service: [name] Royals, DO RCA S 09/10/2022 1:15 PM Medical Record Number: [id number] Patient Account Number: [id number] Date of Birth/Sex: Treating RN: Aug 29, 1925 (89 y.o. [name] Primary Care Provider: Frazier Richards Other Clinician: Referring Provider: Treating Provider/Extender: [name] in Treatment: 8 Verbal / Phone Orders: No Diagnosis Coding ICD-10 Coding Code Description [id number] Chronic venous hypertension (idiopathic) with ulcer and inflammation of left lower extremity L97.822 Non-pressure chronic ulcer of other part of left lower leg with fat layer exposed I10 Essential (primary) hypertension I48.0 Paroxysmal atrial fibrillation Z79.01 Long term (current) use of anticoagulants Discharge From [facility] Discharge from [place] back next week for one more check to make sure wounds are still closed. Continue to use the foam and a band aid over the right second toe. ABD pad and Tubi Grip on the left leg for one more week then put the compression socks  on. Bathing/ Shower/ Hygiene May shower with wound dressing  protected with water repellent cover or cast protector. - Do not wear slip on shoes without a back. Wear shoes that will support the right foot without slipping Change foam and bandaid on Right 2nd toe 3 x week. T open with compression socks. May put another sock over the toes oes Anesthetic (Use 'Patient Medications' Section for Anesthetic Order Entry) Lidocaine applied to wound bed Edema Control - Lymphedema / Segmental Compressive Device / Other Left Lower Extremity Elevate, Exercise Daily and A void Standing for Long Periods of Time. Elevate legs to the level of the heart and pump ankles as often as possible Elevate leg(s) parallel to the floor when sitting. Additional Orders / Instructions Follow Nutritious Diet and Increase Protein Intake Electronic Signature(s) Signed: 09/10/2022 5:39:55 PM By: Worthy Keeler PA-C Signed: 09/11/2022 1:32:16 PM By: Rosalio Loud MSN RN CNS WTA Entered By: Rosalio Loud on 09/10/2022 14:05:29 -------------------------------------------------------------------------------- Problem List Details Patient Name: Date of Service: [name] Bufford Spikes, DO RCA S 09/10/2022 1:15 PM Medical Record Number: [id number] Patient Account Number: [id number] Date of Birth/Sex: Treating RN: 1926-05-13 (89 y.o. [name] Primary Care Provider: Frazier Richards Other Clinician: Referring Provider: Treating Provider/Extender: [name] in Treatment: 8 Active Problems ICD-10 Encounter [place], [name] ([id number]) [id number] Page 4 of 6 Encounter Code Description Active Date MDM Diagnosis I87.332 Chronic venous hypertension (idiopathic) with ulcer and inflammation of left 07/16/2022 No Yes lower extremity L97.822 Non-pressure chronic ulcer of other part of left lower leg with fat layer exposed12/21/2023 No Yes I10 Essential (primary) hypertension 07/16/2022 No  Yes I48.0 Paroxysmal atrial fibrillation 07/16/2022 No Yes Z79.01 Long term (current) use of anticoagulants 07/16/2022 No Yes Inactive Problems Resolved Problems Electronic Signature(s) Signed: 09/10/2022 1:56:13 PM By: Worthy Keeler PA-C Entered By: Worthy Keeler on 09/10/2022 13:56:13 -------------------------------------------------------------------------------- Progress Note Details Patient Name: Date of Service: [name] MSEY, DO RCA S 09/10/2022 1:15 PM Medical Record Number: [id number] Patient Account Number: [id number] Date of Birth/Sex: Treating RN: 1925/11/27 (89 y.o. [name] Primary Care Provider: Frazier Richards Other Clinician: Referring Provider: Treating Provider/Extender: [name] in Treatment: 8 Subjective Chief Complaint Information obtained from Patient Left lateral LE ulcer History of Present Illness (HPI) 07-16-2022 upon evaluation today patient presents for initial inspection here in our clinic concerning a wound on the left lateral lower extremity. This is an area that has been present since around May 27, 2022 according to what the patient tells me today. With that being said she does live in assisted living facility. She is seen with her son in the office today. As far as past medical history is concerned she does have a history of breast cancer in 2003 which she did obviously survive. She also had a right knee replacement. In 2009 she had an IVC filter and heart ablation due to atrial fibrillation. With regard to the remainder of her medical history again she is on anticoagulant therapy at this point due to atrial fibrillation, she has hypertension, and chronic venous insufficiency based on what I am seeing currently. In regard to the wound she is currently on Keflex which should be completed on the 25th of this month. Other than that she has had various things applied to the wound including different creams but then at  other times she also states that she has had nothing put on and been told to just leave it open to air. There is been a lot of confusion about the best treatment course with regard to  her wound which is obviously not good. I discussed with her today that I think we can have the best plan going forward and that that will include keeping this covered but nonetheless it should help it to heal the most effectively as well. 12/29; left lower leg chronic venous insufficiency. She complains fairly bitterly about the tightness of the wrap. She was put on antibiotics last week which I believe is Keflex. She lives in an assisted living 07-31-2021 upon evaluation today patient appears to be doing well currently in regard to her wound. She is actually been tolerating the dressing changes this is KRITHIKA, ROMANIELLO ([id number]) [id number] Page 5 of 6 measuring smaller and looking better. I think we are on the right track I do believe that the current compression wrap is helping significantly with her [place] at this point. Overall I do not see any signs of infection locally nor systemically which is great news. No fevers, chills, nausea, vomiting, or diarrhea. 08-13-2022 upon evaluation today patient's wound on her leg actually showing signs of erythema around the edges of the wound that she is also still having quite a bit of drainage all things considered. Fortunately I do not see any evidence of infection systemically though locally I definitely see some evidence here both with erythema, warmth, and drainage. She has been using the silver alginate dressing we have also had an 3 layer compression wrap though she did not feel good felt very tight at the top of the wrap which is the main concern she has today. 1/25; the patient's wound is smaller today. She is on doxycycline. Culture result from last week showed MRSA but fortunately sensitive to doxycycline which she is taking. She is using TCA  Sorbact under 3 layer compression. 08-27-2022 upon evaluation today patient appears to be doing well currently in regard to her left leg ulcer which does not appear to be doing poorly at all in fact this appears to be healing quite nicely. I am extremely pleased with where we stand I do not see any signs of active infection locally nor systemically which is great news. No fevers, chills, nausea, vomiting, or diarrhea. 09-03-2022 upon evaluation patient's wound is actually showing signs of significant improvement I am actually very pleased with where we stand currently. I do not see any signs of active knee flexion at this time which is great news. No fevers, chills, nausea, vomiting, or diarrhea. 09-10-2022 upon evaluation today patient appears to be doing well currently in regard to her wounds. Both the toe as well as the leg appear to be showing signs of good improvement. Fortunately I do not see any evidence of active infection at this time which is great news. She does have her compression socks. Objective Constitutional Well-nourished and well-hydrated in no acute distress. Vitals Time Taken: 1:31 PM, Weight: 106 lbs, Temperature: 97.3 F, Pulse: 66 bpm, Respiratory Rate: 16 breaths/min, Blood Pressure: 134/65 mmHg. Respiratory normal breathing without difficulty. Psychiatric this patient is able to make decisions and demonstrates good insight into disease process. Alert and Oriented x 3. pleasant and cooperative. General Notes: Upon inspection patient's wound bed actually showed signs of good epithelization at this point. Fortunately I do not see any evidence of infection at this time which is great news. No fevers, chills, nausea, vomiting, or diarrhea. Integumentary (Hair, Skin) Wound #1 status is Healed - Epithelialized. Original cause of wound was Gradually Appeared. The date acquired was: 05/27/2022. The wound has been in treatment 8 weeks. The wound  is located on the Left,Lateral Lower  Leg. The wound measures 0cm length x 0cm width x 0cm depth; 0cm^2 area and 0cm^3 volume. There is Fat Layer (Subcutaneous Tissue) exposed. There is a large amount of serous drainage noted. The wound margin is indistinct and nonvisible. There is small (1-33%) red, friable granulation within the wound bed. There is a large (67-100%) amount of necrotic tissue within the wound bed. Wound #2 status is Healed - Epithelialized. Original cause of wound was Pressure Injury. The date acquired was: 08/27/2022. The wound has been in treatment 2 weeks. The wound is located on the Anterior T Second. The wound measures 0cm length x 0cm width x 0cm depth; 0cm^2 area and 0cm^3 volume. There is a oe medium amount of serous drainage noted. There is medium (34-66%) red, pink granulation within the wound bed. There is no necrotic tissue within the wound bed. Assessment Active Problems ICD-10 Chronic venous hypertension (idiopathic) with ulcer and inflammation of left lower extremity Non-pressure chronic ulcer of other part of left lower leg with fat layer exposed Essential (primary) hypertension Paroxysmal atrial fibrillation Long term (current) use of anticoagulants Plan Discharge From [facility] Services: Discharge from [place] back next week for one more check to make sure wounds are still closed. Continue to use the foam and a band aid over the right second toe. ABD pad and Tubi Grip on the left leg for one more week then put the compression socks on. Bathing/ Shower/ Hygiene: May shower with wound dressing protected with water repellent cover or cast protector. - Do not wear slip on shoes without a back. Wear shoes that will support the right foot without slipping Change foam and bandaid on Right 2nd toe 3 x week. T open with compression socks. May put another sock over the toes oes Anesthetic (Use 'Patient Medications' Section for Anesthetic Order Entry): Lidocaine applied to  wound bed Edema Control - Lymphedema / Segmental Compressive Device / OtherKIMBERLYE, SAINTFLEUR ([id number]) [id number] Page 6 of 6 Elevate, Exercise Daily and Avoid Standing for Long Periods of Time. Elevate legs to the level of the heart and pump ankles as often as possible Elevate leg(s) parallel to the floor when sitting. Additional Orders / Instructions: Follow Nutritious Diet and Increase Protein Intake 1. Based on what I am seeing I do believe that the patient is completely healed which is great news. I do not see any signs of active infection also great news. 2. I am also can recommend that she continue with Tubigrip and an ABD pad for the left leg for the next week on the right she needs to continue with the foam as well as the Band-Aid to secure in place on the second toe which is going to continue to be an issue with rubbing. We will see patient back for reevaluation in 1 week here in the clinic. If anything worsens or changes patient will contact our office for additional recommendations. Electronic Signature(s) Signed: 09/10/2022 2:32:33 PM By: Worthy Keeler PA-C Entered By: Worthy Keeler on 09/10/2022 14:32:33 -------------------------------------------------------------------------------- SuperBill Details Patient Name: Date of Service: [name] MSEY, DO RCA S 09/10/2022 Medical Record Number: [id number] Patient Account Number: [id number] Date of Birth/Sex: Treating RN: 03-Nov-1925 (89 y.o. [name] Primary Care Provider: Frazier Richards Other Clinician: Referring Provider: Treating Provider/Extender: [name] in Treatment: 8 Diagnosis Coding ICD-10 Codes Code Description [phone] Chronic venous hypertension (idiopathic) with ulcer and inflammation of left lower extremity  [id number] Non-pressure chronic ulcer of other part of left lower leg with fat layer exposed I10 Essential (primary) hypertension I48.0 Paroxysmal  atrial fibrillation Z79.01 Long term (current) use of anticoagulants Facility Procedures : CPT4 Code: [id number] Description: [id number] - WOUND CARE VISIT-LEV 3 EST PT Modifier: Quantity: 1 Physician Procedures : CPT4 Code Description Modifier [id number] - WC PHYS LEVEL 3 - EST PT ICD-10 Diagnosis Description I87.332 Chronic venous hypertension (idiopathic) with ulcer and inflammation of left lower extremity L97.822 Non-pressure chronic ulcer of other part  of left lower leg with fat layer exposed I10 Essential (primary) hypertension I48.0 Paroxysmal atrial fibrillation Quantity: 1 Electronic Signature(s) Signed: 09/10/2022 2:32:53 PM By: Worthy Keeler PA-C Entered By: Worthy Keeler on 09/10/2022 14:32:52

## 2022-09-12 NOTE — Progress Notes (Signed)
BRENA, [name] ([id number]) [id number] Page 1 of 10 Visit Report for 09/10/2022 Arrival Information Details Patient Name: Date of Service: [name] Royals, DO RCA S 09/10/2022 1:15 PM Medical Record Number: [id number] Patient Account Number: [id number] Date of Birth/Sex: Treating RN: 07-08-1926 (89 y.o. [name] Primary Care Berdine Rasmusson: Frazier Richards Other Clinician: Referring Daelon Dunivan: Treating Suesan Mohrmann/Extender: [name] in Treatment: 8 Visit Information History Since Last Visit Added or deleted any medications: No Patient Arrived: [name] Any new allergies or adverse reactions: No Arrival Time: 13:31 Had a fall or experienced change in No Accompanied By: son activities of daily living that may affect Transfer Assistance: None risk of falls: Patient Identification Verified: Yes Has Dressing in Place as Prescribed: Yes Secondary Verification Process Completed: Yes Pain Present Now: No Patient Requires Transmission-Based Precautions: No Patient Has Alerts: Yes Patient Alerts: Patient on Blood Thinner NOT DIABETIC Eliquis Electronic Signature(s) Signed: 09/11/2022 1:32:16 PM By: Rosalio Loud MSN RN CNS WTA Entered By: Rosalio Loud on 09/10/2022 13:31:50 -------------------------------------------------------------------------------- Clinic Level of Care Assessment Details Patient Name: Date of Service: [name] MSEY, DO RCA S 09/10/2022 1:15 PM Medical Record Number: [id number] Patient Account Number: [id number] Date of Birth/Sex: Treating RN: [birth year] (89 y.o. [name] Primary Care Damarrion Mimbs: Frazier Richards Other Clinician: Referring Nakiesha Rumsey: Treating Casimira Sutphin/Extender: [name] in Treatment: 8 Clinic Level of Care Assessment Items TOOL 4 Quantity Score X- 1 0 Use when only an EandM is performed on FOLLOW-UP visit ASSESSMENTS - Nursing Assessment / Reassessment X- 1 10 Reassessment  of Co-morbidities (includes updates in patient status) X- 1 5 Reassessment of Adherence to Treatment Plan ASSESSMENTS - Wound and Skin A ssessment / Reassessment X - Simple Wound Assessment / Reassessment - one wound 1 5 Krabill, Aissa ([id number] Page 2 of 10 []$  - 0 Complex Wound Assessment / Reassessment - multiple wounds []$  - 0 Dermatologic / Skin Assessment (not related to wound area) ASSESSMENTS - Focused Assessment []$  - 0 Circumferential Edema Measurements - multi extremities []$  - 0 Nutritional Assessment / Counseling / Intervention []$  - 0 Lower Extremity Assessment (monofilament, tuning fork, pulses) []$  - 0 Peripheral Arterial Disease Assessment (using hand held doppler) ASSESSMENTS - Ostomy and/or Continence Assessment and Care []$  - 0 Incontinence Assessment and Management []$  - 0 Ostomy Care Assessment and Management (repouching, etc.) PROCESS - Coordination of Care X - Simple Patient / Family Education for ongoing care 1 15 []$  - 0 Complex (extensive) Patient / Family Education for ongoing care X- 1 10 Staff obtains Programmer, systems, Records, T Results / Process Orders est []$  - 0 Staff telephones HHA, Nursing Homes / Clarify orders / etc []$  - 0 Routine Transfer to another Facility (non-emergent condition) []$  - 0 Routine Hospital Admission (non-emergent condition) []$  - 0 New Admissions / Biomedical engineer / Ordering NPWT Apligraf, etc. , []$  - 0 Emergency Hospital Admission (emergent condition) X- 1 10 Simple Discharge Coordination []$  - 0 Complex (extensive) Discharge Coordination PROCESS - Special Needs []$  - 0 Pediatric / Minor Patient Management []$  - 0 Isolation Patient Management []$  - 0 Hearing / Language / Visual special needs []$  - 0 Assessment of Community assistance (transportation, D/C planning, etc.) []$  - 0 Additional assistance / Altered mentation []$  - 0 Support Surface(s) Assessment (bed, cushion, seat,  etc.) INTERVENTIONS - Wound Cleansing / Measurement X - Simple Wound Cleansing - one wound 1 5 []$  - 0 Complex Wound Cleansing - multiple wounds []$  - 0 Wound Imaging (photographs - any  number of wounds) []$  - 0 Wound Tracing (instead of photographs) X- 1 5 Simple Wound Measurement - one wound []$  - 0 Complex Wound Measurement - multiple wounds INTERVENTIONS - Wound Dressings X - Small Wound Dressing one or multiple wounds 1 10 []$  - 0 Medium Wound Dressing one or multiple wounds []$  - 0 Large Wound Dressing one or multiple wounds []$  - 0 Application of Medications - topical []$  - 0 Application of Medications - injection INTERVENTIONS - Miscellaneous []$  - 0 External ear exam []$  - 0 Specimen Collection (cultures, biopsies, blood, body fluids, etc.) SHELONDA, BOLLENBACHER ([id number] Page 3 of 10 []$  - 0 Specimen(s) / Culture(s) sent or taken to Lab for analysis []$  - 0 Patient Transfer (multiple staff / Harrel Lemon Lift / Similar devices) []$  - 0 Simple Staple / Suture removal (25 or less) []$  - 0 Complex Staple / Suture removal (26 or more) []$  - 0 Hypo / Hyperglycemic Management (close monitor of Blood Glucose) []$  - 0 Ankle / Brachial Index (ABI) - do not check if billed separately X- 1 5 Vital Signs Has the patient been seen at the hospital within the last three years: Yes Total Score: 80 Level Of Care: New/Established - Level 3 Electronic Signature(s) Signed: 09/11/2022 1:32:16 PM By: Rosalio Loud MSN RN CNS WTA Entered By: Rosalio Loud on 09/10/2022 14:06:10 -------------------------------------------------------------------------------- Encounter Discharge Information Details Patient Name: Date of Service: [name] Royals, DO RCA S 09/10/2022 1:15 PM Medical Record Number: [id number] Patient Account Number: [id number] Date of Birth/Sex: Treating RN: [birth year] (89 y.o. [name] Primary Care Abdoulaye Drum: Frazier Richards Other Clinician: Referring  [name]: Treating German Manke/Extender: [name] in Treatment: 8 Encounter Discharge Information Items Discharge Condition: Stable Ambulatory Status: Walker Discharge Destination: Home Transportation: Private Auto Accompanied By: son Schedule Follow-up Appointment: Yes Clinical Summary of Care: Electronic Signature(s) Signed: 09/11/2022 1:32:16 PM By: Rosalio Loud MSN RN CNS WTA Entered By: Rosalio Loud on 09/10/2022 14:07:05 -------------------------------------------------------------------------------- Lower Extremity Assessment Details Patient Name: Date of Service: [name] MSEY, DO RCA S 09/10/2022 1:15 PM Medical Record Number: [id number] Patient Account Number: [id number] Date of Birth/Sex: Treating RN: [birth year] (89 y.o. [name] [place], [place] ([id number]) [id number] Page 4 of 10 Primary Care Tamas Suen: Frazier Richards Other Clinician: Referring Jullianna Gabor: Treating Ylonda Storr/Extender: [name] in Treatment: 8 Edema Assessment Assessed: [Left: No] [Right: No] [Left: Edema] [Right: :] Calf Left: Right: Point of Measurement: 30 cm From Medial Instep 28.2 cm 28 cm Ankle Left: Right: Point of Measurement: 12 cm From Medial Instep 18.7 cm 18 cm Vascular Assessment Pulses: Dorsalis Pedis Palpable: [Left:Yes] [Right:Yes] Electronic Signature(s) Signed: 09/11/2022 1:32:16 PM By: Rosalio Loud MSN RN CNS WTA Entered By: Rosalio Loud on 09/10/2022 14:02:48 -------------------------------------------------------------------------------- Multi Wound Chart Details Patient Name: Date of Service: [name] Royals, DO RCA S 09/10/2022 1:15 PM Medical Record Number: [id number] Patient Account Number: [id number] Date of Birth/Sex: Treating RN: 1925-10-21 (89 y.o. [name] Primary Care Renardo Cheatum: Frazier Richards Other Clinician: Referring Kasper Mudrick: Treating Casmira Cramer/Extender: [name] in Treatment: 8 Vital Signs Height(in): Pulse(bpm): 62 Weight(lbs): 106 Blood Pressure(mmHg): 134/65 Body Mass Index(BMI): Temperature(F): 97.3 Respiratory Rate(breaths/min): 16 [1:Photos:] [N/A:N/A] Left, Lateral Lower Leg Anterior T Second oe N/A Wound Location: Gradually Appeared Pressure Injury N/A Wounding Event: Venous Leg Ulcer Pressure Ulcer N/A Primary Etiology: Hypertension, Dementia, Received Hypertension, Dementia, Received N/A Comorbid History: Chemotherapy Chemotherapy RIA, KATE ([id number] Page 5 of 10 05/27/2022 08/27/2022 N/A Date Acquired: 8 2 N/A [place]  of Treatment: Healed - Epithelialized Healed - Epithelialized N/A Wound Status: No No N/A Wound Recurrence: 0x0x0 0x0x0 N/A Measurements L x W x D (cm) 0 0 N/A A (cm) : rea 0 0 N/A Volume (cm) : 100.00% 100.00% N/A % Reduction in Area: 100.00% 100.00% N/A % Reduction in Volume: Full Thickness Without Exposed Category/Stage II N/A Classification: Support Structures Large Medium N/A Exudate Amount: Serous Serous N/A Exudate Type: amber amber N/A Exudate Color: Indistinct, nonvisible N/A N/A Wound Margin: Small (1-33%) Medium (34-66%) N/A Granulation Amount: Red, Friable Red, Pink N/A Granulation Quality: Large (67-100%) None Present (0%) N/A Necrotic Amount: Fat Layer (Subcutaneous Tissue): Yes Fascia: No N/A Exposed Structures: Fascia: No Fat Layer (Subcutaneous Tissue): No Tendon: No Tendon: No Muscle: No Muscle: No Joint: No Joint: No Bone: No Bone: No None Small (1-33%) N/A Epithelialization: Treatment Notes Electronic Signature(s) Signed: 09/11/2022 1:32:16 PM By: Rosalio Loud MSN RN CNS WTA Entered By: Rosalio Loud on 09/10/2022 14:02:55 -------------------------------------------------------------------------------- Multi-Disciplinary Care Plan Details Patient Name: Date of Service: [name] Royals, DO RCA S 09/10/2022  1:15 PM Medical Record Number: [id number] Patient Account Number: [id number] Date of Birth/Sex: Treating RN: [birth year] (89 y.o. [name] Primary Care Thoams Siefert: Frazier Richards Other Clinician: Referring Latif Nazareno: Treating Agusta Hackenberg/Extender: [name] in Treatment: 8 Active Inactive Pain, Acute or Chronic Nursing Diagnoses: Pain Management - Non-cyclic Acute (Procedural) Potential alteration in comfort, pain Goals: Patient will verbalize adequate pain control and receive pain control interventions during procedures as needed Date Initiated: 07/16/2022 Target Resolution Date: 08/13/2022 Goal Status: Active Patient/caregiver will verbalize adequate pain control between visits Date Initiated: 07/16/2022 Target Resolution Date: 08/13/2022 Goal Status: Active Patient/caregiver will verbalize comfort level met Date Initiated: 07/16/2022 Target Resolution Date: 08/13/2022 Goal Status: Active Interventions: Reposition patient for comfort FATIMAZAHRA, IRIAS ([id number]) [id number] Page 6 of 10 Treatment Activities: Administer pain control measures as ordered : 07/16/2022 Notes: Venous Leg Ulcer Nursing Diagnoses: Knowledge deficit related to disease process and management Potential for venous Insuffiency (use before diagnosis confirmed) Goals: Patient will maintain optimal edema control Date Initiated: 07/16/2022 Target Resolution Date: 08/13/2022 Goal Status: Active Patient/caregiver will verbalize understanding of disease process and disease management Date Initiated: 07/16/2022 Target Resolution Date: 08/13/2022 Goal Status: Active Interventions: Assess peripheral edema status every visit. Compression as ordered Notes: Wound/Skin Impairment Nursing Diagnoses: Impaired tissue integrity Knowledge deficit related to smoking impact on wound healing Knowledge deficit related to ulceration/compromised skin  integrity Goals: Patient/caregiver will verbalize understanding of skin care regimen Date Initiated: 07/16/2022 Target Resolution Date: 08/13/2022 Goal Status: Active Ulcer/skin breakdown will have a volume reduction of 30% by week 4 Date Initiated: 07/16/2022 Date Inactivated: 08/13/2022 Target Resolution Date: 08/13/2022 Goal Status: Met Ulcer/skin breakdown will have a volume reduction of 50% by week 8 Date Initiated: 08/13/2022 Target Resolution Date: 09/10/2022 Goal Status: Active Interventions: Assess patient/caregiver ability to obtain necessary supplies Assess patient/caregiver ability to perform ulcer/skin care regimen upon admission and as needed Assess ulceration(s) every visit Provide education on ulcer and skin care Treatment Activities: Skin care regimen initiated : 07/16/2022 Topical wound management initiated : 07/16/2022 Notes: Electronic Signature(s) Signed: 09/11/2022 1:32:16 PM By: Rosalio Loud MSN RN CNS WTA Entered By: Rosalio Loud on 09/10/2022 14:06:31 Pain Assessment Details -------------------------------------------------------------------------------- [name] ([id number] Page 7 of 10 Patient Name: Date of Service: [name] Royals, DO RCA S 09/10/2022 1:15 PM Medical Record Number: [id number] Patient Account Number: [id number] Date of Birth/Sex: Treating RN: [birth year] (89 y.o. [name] Primary Care Fiorela Pelzer: Ouida Sills,  Ruthann Cancer Other Clinician: Referring Demitra Danley: Treating Morrison Mcbryar/Extender: [name] in Treatment: 8 Active Problems Location of Pain Severity and Description of Pain Patient Has Paino No Site Locations Pain Management and Medication Current Pain Management: Electronic Signature(s) Signed: 09/11/2022 1:32:16 PM By: Rosalio Loud MSN RN CNS WTA Entered By: Rosalio Loud on 09/10/2022  13:34:16 -------------------------------------------------------------------------------- Patient/Caregiver Education Details Patient Name: Date of Service: [name] Royals, DO RCA S 2/15/2024andnbsp1:15 PM Medical Record Number: [id number] Patient Account Number: [id number] Date of Birth/Gender: Treating RN: Sep 22, 1925 (89 y.o. [name] Primary Care Physician: Frazier Richards Other Clinician: Referring Physician: Treating Physician/Extender: [name] in Treatment: 8 Education Assessment Education Provided To: Patient Education Topics Provided Electronic Signature(s) Signed: 09/11/2022 1:32:16 PM By: Rosalio Loud MSN RN CNS WTA Entered By: Rosalio Loud on 09/10/2022 14:06:25 [name] ([id number] Page 8 of 10 -------------------------------------------------------------------------------- Wound Assessment Details Patient Name: Date of Service: [name] Royals, DO RCA S 09/10/2022 1:15 PM Medical Record Number: [id number] Patient Account Number: [id number] Date of Birth/Sex: Treating RN: July 02, 1926 (89 y.o. [name] Primary Care Glady Ouderkirk: Frazier Richards Other Clinician: Referring Darnel Mchan: Treating Davida Falconi/Extender: [name] in Treatment: 8 Wound Status Wound Number: 1 Primary Etiology: Venous Leg Ulcer Wound Location: Left, Lateral Lower Leg Wound Status: Healed - Epithelialized Wounding Event: Gradually Appeared Comorbid History: Hypertension, Dementia, Received Chemotherapy Date Acquired: 05/27/2022 Weeks Of Treatment: 8 Clustered Wound: No Photos Wound Measurements Length: (cm) Width: (cm) Depth: (cm) Area: (cm) Volume: (cm) 0 % Reduction in Area: 100% 0 % Reduction in Volume: 100% 0 Epithelialization: None 0 0 Wound Description Classification: Full Thickness Without Exposed Support Wound Margin: Indistinct, nonvisible Exudate Amount: Large Exudate Type:  Serous Exudate Color: amber Structures Foul Odor After Cleansing: No Slough/Fibrino Yes Wound Bed Granulation Amount: Small (1-33%) Exposed Structure Granulation Quality: Red, Friable Fascia Exposed: No Necrotic Amount: Large (67-100%) Fat Layer (Subcutaneous Tissue) Exposed: Yes Tendon Exposed: No Muscle Exposed: No Joint Exposed: No Bone Exposed: No Treatment Notes Wound #1 (Lower Leg) Wound Laterality: Left, Lateral Cleanser Peri-Wound Care [place], [name] ([id number]) [id number] Page 9 of 10 Topical Primary Dressing Secondary Dressing Secured With Compression Wrap Compression Stockings Add-Ons Electronic Signature(s) Signed: 09/11/2022 1:32:16 PM By: Rosalio Loud MSN RN CNS WTA Entered By: Rosalio Loud on 09/10/2022 14:00:43 -------------------------------------------------------------------------------- Wound Assessment Details Patient Name: Date of Service: [name] Bufford Spikes, DO RCA S 09/10/2022 1:15 PM Medical Record Number: [id number] Patient Account Number: [id number] Date of Birth/Sex: Treating RN: [birth year] (89 y.o. [name] Primary Care Johndaniel Catlin: Frazier Richards Other Clinician: Referring Ieisha Gao: Treating Chalonda Schlatter/Extender: [name] in Treatment: 8 Wound Status Wound Number: 2 Primary Etiology: Pressure Ulcer Wound Location: Anterior T Second oe Wound Status: Healed - Epithelialized Wounding Event: Pressure Injury Comorbid History: Hypertension, Dementia, Received Chemotherapy Date Acquired: 08/27/2022 Weeks Of Treatment: 2 Clustered Wound: No Photos Wound Measurements Length: (cm) Width: (cm) Depth: (cm) Area: (cm) Volume: (cm) 0 % Reduction in Area: 100% 0 % Reduction in Volume: 100% 0 Epithelialization: Small (1-33%) 0 0 Wound Description Classification: Category/Stage II Exudate Amount: Medium Exudate Type: Serous Exudate Color: amber Helf, Rayan ([id number]) Wound  Bed Granulation Amount: Medium (34-66%) Granulation Quality: Red, Pink Necrotic Amount: None Present (0%) Foul Odor After Cleansing: No Slough/Fibrino No [id number] Page 10 of 10 Exposed Structure Fascia Exposed: No Fat Layer (Subcutaneous Tissue) Exposed: No Tendon Exposed: No Muscle Exposed: No Joint Exposed: No Bone Exposed: No Treatment Notes Wound #2 (Toe Second) Wound Laterality: Anterior Cleanser  Peri-Wound Care Topical Primary Dressing Secondary Dressing Secured With Compression Wrap Compression Stockings Add-Ons Electronic Signature(s) Signed: 09/11/2022 1:32:16 PM By: Rosalio Loud MSN RN CNS WTA Entered By: Rosalio Loud on 09/10/2022 14:02:44 -------------------------------------------------------------------------------- Vitals Details Patient Name: Date of Service: [name] Bufford Spikes, DO RCA S 09/10/2022 1:15 PM Medical Record Number: [id number] Patient Account Number: [id number] Date of Birth/Sex: Treating RN: 1926/06/19 (89 y.o. [name] Primary Care Stevie Charter: Frazier Richards Other Clinician: Referring Laritza Vokes: Treating Rasheida Broden/Extender: [name] in Treatment: 8 Vital Signs Time Taken: 13:31 Temperature (F): 97.3 Weight (lbs): 106 Pulse (bpm): 66 Respiratory Rate (breaths/min): 16 Blood Pressure (mmHg): 134/65 Reference Range: 80 - 120 mg / dl Electronic Signature(s) Signed: 09/11/2022 1:32:16 PM By: Rosalio Loud MSN RN CNS WTA Entered By: Rosalio Loud on 09/10/2022 13:33:44

## 2022-09-17 ENCOUNTER — Ambulatory Visit: Payer: TRICARE For Life (TFL) | Admitting: Internal Medicine

## 2022-09-24 ENCOUNTER — Encounter: Payer: Medicare Other | Admitting: Physician Assistant

## 2022-09-24 ENCOUNTER — Other Ambulatory Visit
Admission: RE | Admit: 2022-09-24 | Discharge: 2022-09-24 | Disposition: A | Discharge status: Home / Self Care | Payer: Medicare Other | Source: Ambulatory Visit | Attending: Physician Assistant | Admitting: Physician Assistant

## 2022-09-24 DIAGNOSIS — L089 Local infection of the skin and subcutaneous tissue, unspecified: Secondary | ICD-10-CM | POA: Diagnosis present

## 2022-09-24 DIAGNOSIS — I87332 Chronic venous hypertension (idiopathic) with ulcer and inflammation of left lower extremity: Secondary | ICD-10-CM | POA: Diagnosis not present

## 2022-09-28 NOTE — Progress Notes (Signed)
KIYANAH, PAYSEUR ([id number]) [id number] Page 1 of 10 Visit Report for 09/24/2022 Arrival Information Details Patient Name: Date of Service: [name] Royals, DO RCA S 09/24/2022 1:15 PM Medical Record Number: [id number] Patient Account Number: [id number] Date of Birth/Sex: Treating RN: [name] (89 y.o. [name] Primary Care [name]: Frazier Richards Other Clinician: Referring Jt [name]: Treating [name]/Extender: [name] in Treatment: 10 Visit Information History Since Last Visit Added or deleted any medications: No Patient Arrived: Walker Has Dressing in Place as Prescribed: Yes Arrival Time: 13:51 Pain Present Now: No Accompanied By: son Transfer Assistance: None Patient Requires Transmission-Based Precautions: No Patient Has Alerts: Yes Patient Alerts: Patient on Blood Thinner NOT DIABETIC Eliquis Electronic Signature(s) Signed: 09/24/2022 5:08:02 PM By: Rosalio Loud MSN RN CNS WTA Entered By: Rosalio Loud on 09/24/2022 17:08:02 -------------------------------------------------------------------------------- Clinic Level of Care Assessment Details Patient Name: Date of Service: [name], DO RCA S 09/24/2022 1:15 PM Medical Record Number: [id number] Patient Account Number: [id number] Date of Birth/Sex: Treating RN: [name] (89 y.o. [name] Primary Care [name]: Frazier Richards Other Clinician: Referring [name]: Treating [name]/Extender: [name] in Treatment: 10 Clinic Level of Care Assessment Items TOOL 4 Quantity Score X- 1 0 Use when only an EandM is performed on FOLLOW-UP visit ASSESSMENTS - Nursing Assessment / Reassessment X- 1 10 Reassessment of Co-morbidities (includes updates in patient status) X- 1 5 Reassessment of Adherence to Treatment Plan ASSESSMENTS - Wound and Skin A ssessment / Reassessment X - Simple Wound Assessment / Reassessment - one wound 1 5 '[]'$   - 0 Complex Wound Assessment / Reassessment - multiple wounds [name], [name] ([id number]) [phone].pdf Page 2 of 10 '[]'$  - 0 Dermatologic / Skin Assessment (not related to wound area) ASSESSMENTS - Focused Assessment '[]'$  - 0 Circumferential Edema Measurements - multi extremities '[]'$  - 0 Nutritional Assessment / Counseling / Intervention '[]'$  - 0 Lower Extremity Assessment (monofilament, tuning fork, pulses) '[]'$  - 0 Peripheral Arterial Disease Assessment (using hand held doppler) ASSESSMENTS - Ostomy and/or Continence Assessment and Care '[]'$  - 0 Incontinence Assessment and Management '[]'$  - 0 Ostomy Care Assessment and Management (repouching, etc.) PROCESS - Coordination of Care X - Simple Patient / Family Education for ongoing care 1 15 '[]'$  - 0 Complex (extensive) Patient / Family Education for ongoing care X- 1 10 Staff obtains Programmer, systems, Records, T Results / Process Orders est '[]'$  - 0 Staff telephones HHA, Nursing Homes / Clarify orders / etc '[]'$  - 0 Routine Transfer to another Facility (non-emergent condition) '[]'$  - 0 Routine Hospital Admission (non-emergent condition) '[]'$  - 0 New Admissions / Biomedical engineer / Ordering NPWT Apligraf, etc. , '[]'$  - 0 Emergency Hospital Admission (emergent condition) X- 1 10 Simple Discharge Coordination '[]'$  - 0 Complex (extensive) Discharge Coordination PROCESS - Special Needs '[]'$  - 0 Pediatric / Minor Patient Management '[]'$  - 0 Isolation Patient Management '[]'$  - 0 Hearing / Language / Visual special needs '[]'$  - 0 Assessment of Community assistance (transportation, D/C planning, etc.) '[]'$  - 0 Additional assistance / Altered mentation '[]'$  - 0 Support Surface(s) Assessment (bed, cushion, seat, etc.) INTERVENTIONS - Wound Cleansing / Measurement X - Simple Wound Cleansing - one wound 1 5 '[]'$  - 0 Complex Wound Cleansing - multiple wounds '[]'$  - 0 Wound Imaging (photographs - any number of wounds) '[]'$  - 0 Wound Tracing  (instead of photographs) X- 1 5 Simple Wound Measurement - one wound '[]'$  - 0 Complex Wound Measurement - multiple wounds INTERVENTIONS - Wound Dressings X -  Small Wound Dressing one or multiple wounds 1 10 '[]'$  - 0 Medium Wound Dressing one or multiple wounds '[]'$  - 0 Large Wound Dressing one or multiple wounds '[]'$  - 0 Application of Medications - topical '[]'$  - 0 Application of Medications - injection INTERVENTIONS - Miscellaneous '[]'$  - 0 External ear exam '[]'$  - 0 Specimen Collection (cultures, biopsies, blood, body fluids, etc.) '[]'$  - 0 Specimen(s) / Culture(s) sent or taken to Lab for analysis [name], [name] ([id number]) [id number] Page 3 of 10 '[]'$  - 0 Patient Transfer (multiple staff / Civil Service fast streamer / Similar devices) '[]'$  - 0 Simple Staple / Suture removal (25 or less) '[]'$  - 0 Complex Staple / Suture removal (26 or more) '[]'$  - 0 Hypo / Hyperglycemic Management (close monitor of Blood Glucose) '[]'$  - 0 Ankle / Brachial Index (ABI) - do not check if billed separately X- 1 5 Vital Signs Has the patient been seen at the hospital within the last three years: Yes Total Score: 80 Level Of Care: New/Established - Level 3 Electronic Signature(s) Signed: 09/25/2022 2:53:23 PM By: Rosalio Loud MSN RN CNS WTA Entered By: Rosalio Loud on 09/24/2022 17:11:24 -------------------------------------------------------------------------------- Encounter Discharge Information Details Patient Name: Date of Service: [name] Royals, DO RCA S 09/24/2022 1:15 PM Medical Record Number: [id number] Patient Account Number: [id number] Date of Birth/Sex: Treating RN: [name]-[birth year] (89 y.o. [name] Primary Care Adley Mazurowski: Frazier Richards Other Clinician: Referring [name]: Treating [name]/Extender: [name] in Treatment: 10 Encounter Discharge Information Items Discharge Condition: Stable Ambulatory Status: Walker Discharge Destination: [place] Telephoned: No Orders Sent: Yes Transportation: Private Auto Accompanied By: son Schedule Follow-up Appointment: Yes Clinical Summary of Care: Electronic Signature(s) Signed: 09/24/2022 5:12:59 PM By: Rosalio Loud MSN RN CNS WTA Entered By: Rosalio Loud on 09/24/2022 17:12:58 -------------------------------------------------------------------------------- Lower Extremity Assessment Details Patient Name: Date of Service: [name], DO RCA S 09/24/2022 1:15 PM Medical Record Number: [id number] Patient Account Number: [id number] Date of Birth/Sex: Treating RN: [birth year][name] (89 y.o. [name] [place], [place] ([id number]) [id number] Page 4 of 10 Primary Care Ruthanna Macchia: Frazier Richards Other Clinician: Referring Janah Mcculloh: Treating Kendrah Lovern/Extender: [name] in Treatment: 10 Edema Assessment Assessed: [Left: No] [Right: Yes] [Left: Edema] [Right: :] Calf Left: Right: Point of Measurement: 30 cm From Medial Instep 33.7 cm Ankle Left: Right: Point of Measurement: 12 cm From Medial Instep 22 cm Vascular Assessment Pulses: Dorsalis Pedis Palpable: [Right:Yes] Electronic Signature(s) Signed: 09/24/2022 5:10:59 PM By: Rosalio Loud MSN RN CNS WTA Previous Signature: 09/24/2022 5:08:17 PM Version By: Rosalio Loud MSN RN CNS WTA Entered By: Rosalio Loud on 09/24/2022 17:10:59 -------------------------------------------------------------------------------- Multi Wound Chart Details Patient Name: Date of Service: [name] Royals, DO RCA S 09/24/2022 1:15 PM Medical Record Number: [id number] Patient Account Number: [id number] Date of Birth/Sex: Treating RN: [birth year][name] (89 y.o. [name] Primary Care Kambry Takacs: Frazier Richards Other Clinician: Referring Dewitte Vannice: Treating Delila Kuklinski/Extender: [name] in Treatment: 10 Vital Signs Height(in): Pulse(bpm): 47 Weight(lbs): 106 Blood Pressure(mmHg):  149/69 Body Mass Index(BMI): Temperature(F): 97.1 Respiratory Rate(breaths/min): 16 [1:Photos:] [N/A:N/A] Left, Lateral Lower Leg Right, Anterior T Second oe N/A Wound Location: Gradually Appeared Pressure Injury N/A Wounding Event: Venous Leg Ulcer Pressure Ulcer N/A Primary Etiology: Hypertension, Dementia, Received Hypertension, Dementia, Received N/A Comorbid HistoryGABRYELLA, [name] ([id number]) [id number] Page 5 of 10 Chemotherapy Chemotherapy 05/27/2022 09/14/2022 N/A Date Acquired: 10 4 N/A Weeks of Treatment: Healed - Epithelialized Open N/A Wound Status: No Yes N/A Wound Recurrence: N/A 1x1x0.3 N/A Measurements L  x W x D (cm) N/A 0.785 N/A A (cm) : rea N/A 0.236 N/A Volume (cm) : N/A -177.40% N/A % Reduction in Area: N/A -742.90% N/A % Reduction in Volume: Full Thickness Without Exposed Category/Stage II N/A Classification: Support Structures Large Medium N/A Exudate Amount: Serous Serous N/A Exudate Type: amber amber N/A Exudate Color: Indistinct, nonvisible N/A N/A Wound Margin: Small (1-33%) Medium (34-66%) N/A Granulation Amount: Red, Friable Red, Pink N/A Granulation Quality: Large (67-100%) None Present (0%) N/A Necrotic Amount: Fat Layer (Subcutaneous Tissue): Yes Fascia: No N/A Exposed Structures: Fascia: No Fat Layer (Subcutaneous Tissue): No Tendon: No Tendon: No Muscle: No Muscle: No Joint: No Joint: No Bone: No Bone: No None Small (1-33%) N/A Epithelialization: Treatment Notes Electronic Signature(s) Signed: 09/24/2022 5:11:05 PM By: Rosalio Loud MSN RN CNS WTA Previous Signature: 09/24/2022 5:08:22 PM Version By: Rosalio Loud MSN RN CNS WTA Entered By: Rosalio Loud on 09/24/2022 17:11:05 -------------------------------------------------------------------------------- Multi-Disciplinary Care Plan Details Patient Name: Date of Service: [name] Royals, DO RCA S 09/24/2022 1:15 PM Medical Record Number:  [id number] Patient Account Number: [id number] Date of Birth/Sex: Treating RN: [birth year][name] (89 y.o. [name] Primary Care Jamariyah Johannsen: Frazier Richards Other Clinician: Referring Ellakate Gonsalves: Treating Michaiah Maiden/Extender: [name] in Treatment: 10 Active Inactive Pain, Acute or Chronic Nursing Diagnoses: Pain Management - Non-cyclic Acute (Procedural) Potential alteration in comfort, pain Goals: Patient will verbalize adequate pain control and receive pain control interventions during procedures as needed Date Initiated: 07/16/2022 Target Resolution Date: 08/13/2022 Goal Status: Active Patient/caregiver will verbalize adequate pain control between visits Date Initiated: 07/16/2022 Target Resolution Date: 08/13/2022 Goal Status: Active Patient/caregiver will verbalize comfort level met Date Initiated: 07/16/2022 Target Resolution Date: 08/13/2022 Goal Status: Active [name], [name] ([id number]) [id number] Page 6 of 10 Interventions: Reposition patient for comfort Treatment Activities: Administer pain control measures as ordered : 07/16/2022 Notes: Wound/Skin Impairment Nursing Diagnoses: Impaired tissue integrity Knowledge deficit related to smoking impact on wound healing Knowledge deficit related to ulceration/compromised skin integrity Goals: Patient/caregiver will verbalize understanding of skin care regimen Date Initiated: 07/16/2022 Target Resolution Date: 08/13/2022 Goal Status: Active Ulcer/skin breakdown will have a volume reduction of 30% by week 4 Date Initiated: 07/16/2022 Date Inactivated: 08/13/2022 Target Resolution Date: 08/13/2022 Goal Status: Met Ulcer/skin breakdown will have a volume reduction of 50% by week 8 Date Initiated: 08/13/2022 Target Resolution Date: 09/10/2022 Goal Status: Active Interventions: Assess patient/caregiver ability to obtain necessary supplies Assess patient/caregiver ability to  perform ulcer/skin care regimen upon admission and as needed Assess ulceration(s) every visit Provide education on ulcer and skin care Treatment Activities: Skin care regimen initiated : 07/16/2022 Topical wound management initiated : 07/16/2022 Notes: Electronic Signature(s) Signed: 09/24/2022 5:12:02 PM By: Rosalio Loud MSN RN CNS WTA Previous Signature: 09/24/2022 5:11:38 PM Version By: Rosalio Loud MSN RN CNS WTA Entered By: Rosalio Loud on 09/24/2022 17:12:02 -------------------------------------------------------------------------------- Pain Assessment Details Patient Name: Date of Service: [name] Royals, DO RCA S 09/24/2022 1:15 PM Medical Record Number: [id number] Patient Account Number: [id number] Date of Birth/Sex: Treating RN: [birth year][name] (89 y.o. [name] Primary Care Amia Rynders: Frazier Richards Other Clinician: Referring Onix Jumper: Treating Brach Birdsall/Extender: [name] in Treatment: 10 Active Problems Location of Pain Severity and Description of Pain Patient Has Paino No Site Locations [place], [place] ([id number]) [id number] Page 7 of 10 Pain Management and Medication Current Pain Management: Electronic Signature(s) Signed: 09/24/2022 5:08:10 PM By: Rosalio Loud MSN RN CNS WTA Entered By: Rosalio Loud on 09/24/2022 17:08:10 -------------------------------------------------------------------------------- Patient/Caregiver Education Details Patient Name:  Date of Service: [name] Royals, DO RCA S 2/29/2024andnbsp1:15 PM Medical Record Number: [id number] Patient Account Number: [id number] Date of Birth/Gender: Treating RN: [birth year] (89 y.o. [name] Primary Care Physician: Frazier Richards Other Clinician: Referring Physician: Treating Physician/Extender: [name] in Treatment: 10 Education Assessment Education Provided To: Patient and Caregiver Education Topics Provided Wound/Skin  Impairment: Handouts: Caring for Your Ulcer Methods: Explain/Verbal Responses: State content correctly Electronic Signature(s) Signed: 09/25/2022 2:53:23 PM By: Rosalio Loud MSN RN CNS WTA Entered By: Rosalio Loud on 09/24/2022 17:12:07 [name] ([id number]) [phone].pdf Page 8 of 10 -------------------------------------------------------------------------------- Wound Assessment Details Patient Name: Date of Service: [name] Royals, DO RCA S 09/24/2022 1:15 PM Medical Record Number: [id number] Patient Account Number: [id number] Date of Birth/Sex: Treating RN: 10-21-[name] (89 y.o. [name] Primary Care Nehemyah Foushee: Frazier Richards Other Clinician: Referring Satoru Milich: Treating Ghali Morissette/Extender: [name] in Treatment: 10 Wound Status Wound Number: 1 Primary Etiology: Venous Leg Ulcer Wound Location: Left, Lateral Lower Leg Wound Status: Healed - Epithelialized Wounding Event: Gradually Appeared Comorbid History: Hypertension, Dementia, Received Chemotherapy Date Acquired: 05/27/2022 Weeks Of Treatment: 10 Clustered Wound: No Photos Wound Measurements % Reduction in Area: % Reduction in Volume: Epithelialization: None Wound Description Classification: Full Thickness Without Exposed Support Wound Margin: Indistinct, nonvisible Exudate Amount: Large Exudate Type: Serous Exudate Color: amber Structures Foul Odor After Cleansing: No Slough/Fibrino Yes Wound Bed Granulation Amount: Small (1-33%) Exposed Structure Granulation Quality: Red, Friable Fascia Exposed: No Necrotic Amount: Large (67-100%) Fat Layer (Subcutaneous Tissue) Exposed: Yes Tendon Exposed: No Muscle Exposed: No Joint Exposed: No Bone Exposed: No Electronic Signature(s) Signed: 09/25/2022 2:53:23 PM By: Rosalio Loud MSN RN CNS WTA Entered By: Rosalio Loud on 09/24/2022 14:02:58 [name] ([id number].pdf Page 9 of  10 -------------------------------------------------------------------------------- Wound Assessment Details Patient Name: Date of Service: [name] Royals, DO RCA S 09/24/2022 1:15 PM Medical Record Number: [id number] Patient Account Number: [id number] Date of Birth/Sex: Treating RN: 04[birth year]20-[name] (89 y.o. [name] Primary Care Madisin Hasan: Frazier Richards Other Clinician: Referring Ann-Marie Kluge: Treating Ashleah Valtierra/Extender: [name] in Treatment: 10 Wound Status Wound Number: [id number] Primary Etiology: Pressure Ulcer Wound Location: Right, Anterior T Second oe Wound Status: Open Wounding Event: Pressure Injury Comorbid History: Hypertension, Dementia, Received Chemotherapy Date Acquired: 09/14/2022 Weeks Of Treatment: 4 Clustered Wound: No Photos Wound Measurements Length: (cm) 1 Width: (cm) 1 Depth: (cm) 0.3 Area: (cm) 0.785 Volume: (cm) 0.236 % Reduction in Area: -177.4% % Reduction in Volume: -742.9% Epithelialization: Small (1-33%) Wound Description Classification: Category/Stage II Exudate Amount: Medium Exudate Type: Serous Exudate Color: amber Foul Odor After Cleansing: No Slough/Fibrino No Wound Bed Granulation Amount: Medium (34-66%) Exposed Structure Granulation Quality: Red, Pink Fascia Exposed: No Necrotic Amount: None Present (0%) Fat Layer (Subcutaneous Tissue) Exposed: No Tendon Exposed: No Muscle Exposed: No Joint Exposed: No Bone Exposed: No Treatment Notes Wound #2R (Toe Second) Wound Laterality: Right, Anterior Cleanser Soap and Water Discharge Instruction: Gently cleanse wound with antibacterial soap, rinse and pat dry prior to dressing wounds Peri-Wound Care [name], [name] ([id number]) [id number] Page 10 of 10 Topical Primary Dressing Prisma 4.34 (in) Discharge Instruction: Moisten w/normal saline or sterile water; Cover wound as directed. Do not remove from wound bed. Xeroform-HBD 2x2  (in/in) Discharge Instruction: Apply Xeroform-HBD 2x2 (in/in) as directed Secondary Dressing Conforming Louisa Roll-Small Discharge Instruction: Apply Conforming Stretch Guaze Bandage as directed Tubular Elastic Net Bandage, Size1, 30 (yd) Secured With Compression Wrap Compression Stockings Environmental education officer) Signed: 09/25/2022 2:53:23 PM By:  Rosalio Loud MSN RN CNS WTA Entered By: Rosalio Loud on 09/24/2022 14:12:16 -------------------------------------------------------------------------------- Vitals Details Patient Name: Date of Service: [name], DO RCA S 09/24/2022 1:15 PM Medical Record Number: [id number] Patient Account Number: [id number] Date of Birth/Sex: Treating RN: [birth year] [name] (89 y.o. [name] Primary Care Dysen Edmondson: Frazier Richards Other Clinician: Referring Irineo Gaulin: Treating Arnetra Terris/Extender: [name] in Treatment: 10 Vital Signs Time Taken: 13:57 Temperature (F): 97.1 Weight (lbs): 106 Pulse (bpm): 79 Respiratory Rate (breaths/min): 16 Blood Pressure (mmHg): 149/69 Reference Range: 80 - 120 mg / dl Electronic Signature(s) Signed: 09/24/2022 5:08:06 PM By: Rosalio Loud MSN RN CNS WTA Entered By: Rosalio Loud on 09/24/2022 17:08:06

## 2022-09-28 NOTE — Progress Notes (Signed)
BHUMIKA, PELISSIER ([id number]) [id number] Page 1 of 7 Visit Report for 09/24/2022 Chief Complaint Document Details Patient Name: Date of Service: [name] Royals, DO RCA S 09/24/2022 1:15 PM Medical Record Number: [id number] Patient Account Number: [id number] Date of Birth/Sex: Treating RN: 1926-02-01 (89 y.o. [name] Primary Care Provider: Frazier Richards Other Clinician: Referring Provider: Treating Provider/Extender: [name] in Treatment: 10 Information Obtained from: Patient Chief Complaint Left lateral LE ulcer Electronic Signature(s) Signed: 09/24/2022 1:07:14 PM By: Worthy Keeler PA-C Entered By: Worthy Keeler on 09/24/2022 13:07:13 -------------------------------------------------------------------------------- HPI Details Patient Name: Date of Service: [name] MSEY, DO RCA S 09/24/2022 1:15 PM Medical Record Number: [id number] Patient Account Number: [id number] Date of Birth/Sex: Treating RN: 1926-05-08 (89 y.o. [name] Primary Care Provider: Frazier Richards Other Clinician: Referring Provider: Treating Provider/Extender: [name] in Treatment: 10 History of Present Illness HPI Description: 07-16-2022 upon evaluation today patient presents for initial inspection here in our clinic concerning a wound on the left lateral lower extremity. This is an area that has been present since around May 27, 2022 according to what the patient tells me today. With that being said she does live in assisted living facility. She is seen with her son in the office today. As far as past medical history is concerned she does have a history of breast cancer in 2003 which she did obviously survive. She also had a right knee replacement. In 2009 she had an IVC filter and heart ablation due to atrial fibrillation. With regard to the remainder of her medical history again she is on anticoagulant therapy at  this point due to atrial fibrillation, she has hypertension, and chronic venous insufficiency based on what I am seeing currently. In regard to the wound she is currently on Keflex which should be completed on the 25th of this month. Other than that she has had various things applied to the wound including different creams but then at other times she also states that she has had nothing put on and been told to just leave it open to air. There is been a lot of confusion about the best treatment course with regard to her wound which is obviously not good. I discussed with her today that I think we can have the best plan going forward and that that will include keeping this covered but nonetheless it should help it to heal the most effectively as well. 12/29; left lower leg chronic venous insufficiency. She complains fairly bitterly about the tightness of the wrap. She was put on antibiotics last week which I believe is Keflex. She lives in an assisted living 07-31-2021 upon evaluation today patient appears to be doing well currently in regard to her wound. She is actually been tolerating the dressing changes this is measuring smaller and looking better. I think we are on the right track I do believe that the current compression wrap is helping significantly with her [facility] ([id number]) [id number] Page 2 of 7 this point. Overall I do not see any signs of infection locally nor systemically which is great news. No fevers, chills, nausea, vomiting, or diarrhea. 08-13-2022 upon evaluation today patient's wound on her leg actually showing signs of erythema around the edges of the wound that she is also still having quite a bit of drainage all things considered. Fortunately I do not see any evidence of infection systemically though locally I definitely see some evidence here both with erythema, warmth, and drainage.  She has been using the silver alginate dressing we  have also had an 3 layer compression wrap though she did not feel good felt very tight at the top of the wrap which is the main concern she has today. 1/25; the patient's wound is smaller today. She is on doxycycline. Culture result from last week showed MRSA but fortunately sensitive to doxycycline which she is taking. She is using TCA Sorbact under 3 layer compression. 08-27-2022 upon evaluation today patient appears to be doing well currently in regard to her left leg ulcer which does not appear to be doing poorly at all in fact this appears to be healing quite nicely. I am extremely pleased with where we stand I do not see any signs of active infection locally nor systemically which is great news. No fevers, chills, nausea, vomiting, or diarrhea. 09-03-2022 upon evaluation patient's wound is actually showing signs of significant improvement I am actually very pleased with where we stand currently. I do not see any signs of active knee flexion at this time which is great news. No fevers, chills, nausea, vomiting, or diarrhea. 09-10-2022 upon evaluation today patient appears to be doing well currently in regard to her wounds. Both the toe as well as the leg appear to be showing signs of good improvement. Fortunately I do not see any evidence of active infection at this time which is great news. She does have her compression socks. 09-24-2022 upon evaluation today patient's wound actually is showing signs of doing much worse than last time I saw her in fact it was pretty much healed we were just seeing her today to ensure that it was still continue to be. Unfortunately not only is open that actually seems to be open down to the joint. I am somewhat concerned about how quickly this went downhill I am not sure if this is an indication of a severe infection that was hide he is in not apparent last time although this is the worst the wound has been it was never even close to this bad when I was seeing her in  the past several weeks. Electronic Signature(s) Signed: 09/24/2022 3:26:26 PM By: Worthy Keeler PA-C Entered By: Worthy Keeler on 09/24/2022 15:26:26 -------------------------------------------------------------------------------- Physical Exam Details Patient Name: Date of Service: [name] MSEY, DO RCA S 09/24/2022 1:15 PM Medical Record Number: [id number] Patient Account Number: [id number] Date of Birth/Sex: Treating RN: 1926/05/31 (89 y.o. [name] Primary Care Provider: Frazier Richards Other Clinician: Referring Provider: Treating Provider/Extender: [name] in Treatment: 23 Constitutional Well-nourished and well-hydrated in no acute distress. Respiratory normal breathing without difficulty. Psychiatric this patient is able to make decisions and demonstrates good insight into disease process. Alert and Oriented x 3. pleasant and cooperative. Notes Upon inspection patient's wound bed actually showed signs of again breakdown of the tissue down to the joint the joint actually appears to be fairly good I do not see any necessarily breakdown of the bone but at the same time the dorsal tendon has completely ruptured at this point and there is no longer anywhere to be found. Therefore the joint itself is completely exposed. We can need to try to keep this from drying out the skin to be of utmost importance at this time. Electronic Signature(s) Signed: 09/24/2022 3:26:56 PM By: Worthy Keeler PA-C Entered By: Worthy Keeler on 09/24/2022 15:26:56 [name] ([id number]) [id number] Page 3 of 7 -------------------------------------------------------------------------------- Physician Orders Details Patient Name: Date of Service: [name] MSEY,  DO RCA S 09/24/2022 1:15 PM Medical Record Number: [id number] Patient Account Number: [id number] Date of Birth/Sex: Treating RN: March 03, 1926 (89 y.o. [name] Primary Care Provider:  Frazier Richards Other Clinician: Referring Provider: Treating Provider/Extender: [name] in Treatment: 10 Verbal / Phone Orders: No Diagnosis Coding ICD-10 Coding Code Description [phone] Chronic venous hypertension (idiopathic) with ulcer and inflammation of left lower extremity L97.822 Non-pressure chronic ulcer of other part of left lower leg with fat layer exposed I10 Essential (primary) hypertension I48.0 Paroxysmal atrial fibrillation Z79.01 Long term (current) use of anticoagulants Bathing/ Shower/ Hygiene Wound #2R Right,Anterior T Second oe May shower; gently cleanse wound with antibacterial soap, rinse and pat dry prior to dressing wounds Edema Control - Lymphedema / Segmental Compressive Device / Other Bilateral Lower Extremities Patient to wear own compression stockings. Remove compression stockings every night before going to bed and put on every morning when getting up. - Wear compression stockings continuously except when due to shower. May remove to shower but replace after. Elevate, Exercise Daily and A void Standing for Long Periods of Time. Elevate legs to the level of the heart and pump ankles as often as possible Elevate leg(s) parallel to the floor when sitting. DO YOUR BEST to sleep in the bed at night. DO NOT sleep in your recliner. Long hours of sitting in a recliner leads to swelling of the legs and/or potential wounds on your backside. Additional Orders / Instructions Follow Nutritious Diet and Increase Protein Intake Wound Treatment Wound #2R - T Second oe Wound Laterality: Right, Anterior Cleanser: Soap and Water 1 x Per Day/30 Days Discharge Instructions: Gently cleanse wound with antibacterial soap, rinse and pat dry prior to dressing wounds Prim Dressing: Prisma 4.34 (in) 1 x Per Day/30 Days ary Discharge Instructions: Moisten w/normal saline or sterile water; Cover wound as directed. Do not remove from wound  bed. Prim Dressing: Xeroform-HBD 2x2 (in/in) 1 x Per Day/30 Days ary Discharge Instructions: Apply Xeroform-HBD 2x2 (in/in) as directed Secondary Dressing: Conforming Guaze Roll-Small 1 x Per Day/30 Days Discharge Instructions: [place]lon as directed Secondary Dressing: Tubular Elastic Net Bandage, Size1, 30 (yd) 1 x Per Day/30 Days Laboratory Bacteria identified in Wound by Culture (MICRO) LOINC Code: [phone] Convenience Name: Wound culture routine Electronic Signature(s) SONI, SEIGFRIED ([id number]) [id number] Page 4 of 7 Signed: 09/30/2022 4:47:39 PM By: Rosalio Loud MSN RN CNS WTA Previous Signature: 09/24/2022 5:15:32 PM Version By: Worthy Keeler PA-C Previous Signature: 09/25/2022 2:53:23 PM Version By: Rosalio Loud MSN RN CNS WTA Entered By: Rosalio Loud on 09/29/2022 16:12:35 -------------------------------------------------------------------------------- Problem List Details Patient Name: Date of Service: [name] Bufford Spikes, DO RCA S 09/24/2022 1:15 PM Medical Record Number: [id number] Patient Account Number: [id number] Date of Birth/Sex: Treating RN: 13-Apr-1926 (89 y.o. [name] Primary Care Provider: Frazier Richards Other Clinician: Referring Provider: Treating Provider/Extender: [name] in Treatment: 10 Active Problems ICD-10 Encounter Code Description Active Date MDM Diagnosis I87.332 Chronic venous hypertension (idiopathic) with ulcer and inflammation of left 07/16/2022 No Yes lower extremity L97.822 Non-pressure chronic ulcer of other part of left lower leg with fat layer exposed12/21/2023 No Yes I10 Essential (primary) hypertension 07/16/2022 No Yes I48.0 Paroxysmal atrial fibrillation 07/16/2022 No Yes Z79.01 Long term (current) use of anticoagulants 07/16/2022 No Yes Inactive Problems Resolved Problems Electronic Signature(s) Signed: 09/24/2022 5:12:16 PM By: Rosalio Loud MSN RN CNS  WTA Signed: 09/24/2022 5:15:32 PM By: Worthy Keeler PA-C Previous Signature: 09/24/2022 1:07:06 PM Version  By: Worthy Keeler PA-C Entered By: Rosalio Loud on 09/24/2022 17:12:15 [name] ([id number]) [id number] Page 5 of 7 -------------------------------------------------------------------------------- Progress Note Details Patient Name: Date of Service: [name] Royals, DO RCA S 09/24/2022 1:15 PM Medical Record Number: [id number] Patient Account Number: [id number] Date of Birth/Sex: Treating RN: 1926/06/27 (89 y.o. [name] Primary Care Provider: Frazier Richards Other Clinician: Referring Provider: Treating Provider/Extender: [name] in Treatment: 10 Subjective Chief Complaint Information obtained from Patient Left lateral LE ulcer History of Present Illness (HPI) 07-16-2022 upon evaluation today patient presents for initial inspection here in our clinic concerning a wound on the left lateral lower extremity. This is an area that has been present since around May 27, 2022 according to what the patient tells me today. With that being said she does live in assisted living facility. She is seen with her son in the office today. As far as past medical history is concerned she does have a history of breast cancer in 2003 which she did obviously survive. She also had a right knee replacement. In 2009 she had an IVC filter and heart ablation due to atrial fibrillation. With regard to the remainder of her medical history again she is on anticoagulant therapy at this point due to atrial fibrillation, she has hypertension, and chronic venous insufficiency based on what I am seeing currently. In regard to the wound she is currently on Keflex which should be completed on the 25th of this month. Other than that she has had various things applied to the wound including different creams but then at other times she also states that  she has had nothing put on and been told to just leave it open to air. There is been a lot of confusion about the best treatment course with regard to her wound which is obviously not good. I discussed with her today that I think we can have the best plan going forward and that that will include keeping this covered but nonetheless it should help it to heal the most effectively as well. 12/29; left lower leg chronic venous insufficiency. She complains fairly bitterly about the tightness of the wrap. She was put on antibiotics last week which I believe is Keflex. She lives in an assisted living 07-31-2021 upon evaluation today patient appears to be doing well currently in regard to her wound. She is actually been tolerating the dressing changes this is measuring smaller and looking better. I think we are on the right track I do believe that the current compression wrap is helping significantly with her [place] at this point. Overall I do not see any signs of infection locally nor systemically which is great news. No fevers, chills, nausea, vomiting, or diarrhea. 08-13-2022 upon evaluation today patient's wound on her leg actually showing signs of erythema around the edges of the wound that she is also still having quite a bit of drainage all things considered. Fortunately I do not see any evidence of infection systemically though locally I definitely see some evidence here both with erythema, warmth, and drainage. She has been using the silver alginate dressing we have also had an 3 layer compression wrap though she did not feel good felt very tight at the top of the wrap which is the main concern she has today. 1/25; the patient's wound is smaller today. She is on doxycycline. Culture result from last week showed MRSA but fortunately sensitive to doxycycline which she is taking. She is using TCA Sorbact under  3 layer compression. 08-27-2022 upon evaluation today patient appears to be doing well currently in  regard to her left leg ulcer which does not appear to be doing poorly at all in fact this appears to be healing quite nicely. I am extremely pleased with where we stand I do not see any signs of active infection locally nor systemically which is great news. No fevers, chills, nausea, vomiting, or diarrhea. 09-03-2022 upon evaluation patient's wound is actually showing signs of significant improvement I am actually very pleased with where we stand currently. I do not see any signs of active knee flexion at this time which is great news. No fevers, chills, nausea, vomiting, or diarrhea. 09-10-2022 upon evaluation today patient appears to be doing well currently in regard to her wounds. Both the toe as well as the leg appear to be showing signs of good improvement. Fortunately I do not see any evidence of active infection at this time which is great news. She does have her compression socks. 09-24-2022 upon evaluation today patient's wound actually is showing signs of doing much worse than last time I saw her in fact it was pretty much healed we were just seeing her today to ensure that it was still continue to be. Unfortunately not only is open that actually seems to be open down to the joint. I am somewhat concerned about how quickly this went downhill I am not sure if this is an indication of a severe infection that was hide he is in not apparent last time although this is the worst the wound has been it was never even close to this bad when I was seeing her in the past several weeks. Objective Constitutional Well-nourished and well-hydrated in no acute distress. Vitals Time Taken: 1:57 PM, Weight: 106 lbs, Temperature: 97.1 F, Pulse: 79 bpm, Respiratory Rate: 16 breaths/min, Blood Pressure: 149/69 mmHg. [name], [name] ([id number]) [id number] Page 6 of 7 Respiratory normal breathing without difficulty. Psychiatric this patient is able to make decisions and demonstrates  good insight into disease process. Alert and Oriented x 3. pleasant and cooperative. General Notes: Upon inspection patient's wound bed actually showed signs of again breakdown of the tissue down to the joint the joint actually appears to be fairly good I do not see any necessarily breakdown of the bone but at the same time the dorsal tendon has completely ruptured at this point and there is no longer anywhere to be found. Therefore the joint itself is completely exposed. We can need to try to keep this from drying out the skin to be of utmost importance at this time. Integumentary (Hair, Skin) Wound #1 status is Healed - Epithelialized. Original cause of wound was Gradually Appeared. The date acquired was: 05/27/2022. The wound has been in treatment 10 weeks. The wound is located on the Left,Lateral Lower Leg. There is Fat Layer (Subcutaneous Tissue) exposed. There is a large amount of serous drainage noted. The wound margin is indistinct and nonvisible. There is small (1-33%) red, friable granulation within the wound bed. There is a large (67-100%) amount of necrotic tissue within the wound bed. Wound #2R status is Open. Original cause of wound was Pressure Injury. The date acquired was: 09/14/2022. The wound has been in treatment 4 weeks. The wound is located on the Right,Anterior T Second. The wound measures 1cm length x 1cm width x 0.3cm depth; 0.785cm^2 area and 0.236cm^3 volume. There oe is a medium amount of serous drainage noted. There is medium (34-66%) red, pink granulation  within the wound bed. There is no necrotic tissue within the wound bed. Assessment Active Problems ICD-10 Chronic venous hypertension (idiopathic) with ulcer and inflammation of left lower extremity Non-pressure chronic ulcer of other part of left lower leg with fat layer exposed Essential (primary) hypertension Paroxysmal atrial fibrillation Long term (current) use of anticoagulants Plan Bathing/ Shower/  Hygiene: Wound #2R Right,Anterior T Second: oe May shower; gently cleanse wound with antibacterial soap, rinse and pat dry prior to dressing wounds Edema Control - Lymphedema / Segmental Compressive Device / Other: Patient to wear own compression stockings. Remove compression stockings every night before going to bed and put on every morning when getting up. - Wear compression stockings continuously except when due to shower. May remove to shower but replace after. Elevate, Exercise Daily and Avoid Standing for Long Periods of Time. Elevate legs to the level of the heart and pump ankles as often as possible Elevate leg(s) parallel to the floor when sitting. DO YOUR BEST to sleep in the bed at night. DO NOT sleep in your recliner. Long hours of sitting in a recliner leads to swelling of the legs and/or potential wounds on your backside. Additional Orders / Instructions: Follow Nutritious Diet and Increase Protein Intake WOUND #2R: - T Second Wound Laterality: Right, Anterior oe Cleanser: Soap and Water 1 x Per Day/30 Days Discharge Instructions: Gently cleanse wound with antibacterial soap, rinse and pat dry prior to dressing wounds Prim Dressing: Prisma 4.34 (in) 1 x Per Day/30 Days ary Discharge Instructions: Moisten w/normal saline or sterile water; Cover wound as directed. Do not remove from wound bed. Prim Dressing: Xeroform-HBD 2x2 (in/in) 1 x Per Day/30 Days ary Discharge Instructions: Apply Xeroform-HBD 2x2 (in/in) as directed Secondary Dressing: Conforming Guaze Roll-Small 1 x Per Day/30 Days Discharge Instructions: Apply Conforming Stretch Guaze Bandage as directed Secondary Dressing: Tubular Elastic Net Bandage, Size1, 30 (yd) 1 x Per Day/30 Days 1. I am good recommend currently that we have the patient going continue with dressing changes this needs to be done daily in my opinion at this point. I would recommend that we use collagen followed by Xeroform to try to keep this from  drying out since we do have bone exposed directly. 2. I am also can recommend based on what we are seeing that we have the patient go ahead and utilize the roll gauze to secure in place. 3. I am also going to suggest that we should go ahead and get an x-ray of this area to evaluate for any evidence of infection this is the first step. 4. I am also sending a prescription for doxycycline she has been on this back in January I am actually give this to her for 30 days at this point as I am concerned about the possibility of osteomyelitis. Actually put 2 refills on it but we will see how things do over the next 30 days. Especially in light of what we are doing with regard to the culture we will see if we need to make any changes in regard to the results coming back from that. Typically if this is an osteomyelitis that would require 6 to 8 weeks of treatment. This was discussed with the patient and her son. We will see patient back for reevaluation in 1 week here in the clinic. If anything worsens or changes patient will contact our office for additional recommendations. Electronic Signature(s) Signed: 09/24/2022 3:28:14 PM By: Luberta Mutter, [place] ([id number]) [id number] Page 7 of 7  Entered By: Worthy Keeler on 09/24/2022 15:28:13 -------------------------------------------------------------------------------- SuperBill Details Patient Name: Date of Service: [name] MSEY, DO RCA S 09/24/2022 Medical Record Number: [id number] Patient Account Number: [id number] Date of Birth/Sex: Treating RN: November 05, 1925 (89 y.o. [name] Primary Care Provider: Frazier Richards Other Clinician: Referring Provider: Treating Provider/Extender: [name] in Treatment: 10 Diagnosis Coding ICD-10 Codes Code Description [id number] Chronic venous hypertension (idiopathic) with ulcer and inflammation of left lower extremity L97.822 Non-pressure chronic  ulcer of other part of left lower leg with fat layer exposed I10 Essential (primary) hypertension I48.0 Paroxysmal atrial fibrillation Z79.01 Long term (current) use of anticoagulants Facility Procedures : CPT4 Code: [id number] Description: 99213 - WOUND CARE VISIT-LEV 3 EST PT Modifier: Quantity: 1 Physician Procedures : CPT4 Code Description Modifier [id number] - WC PHYS LEVEL 4 - EST PT ICD-10 Diagnosis Description I87.332 Chronic venous hypertension (idiopathic) with ulcer and inflammation of left lower extremity L97.822 Non-pressure chronic ulcer of other part  of left lower leg with fat layer exposed I10 Essential (primary) hypertension I48.0 Paroxysmal atrial fibrillation Quantity: 1 Electronic Signature(s) Signed: 09/24/2022 5:11:33 PM By: Rosalio Loud MSN RN CNS WTA Signed: 09/24/2022 5:15:32 PM By: Worthy Keeler PA-C Previous Signature: 09/24/2022 3:28:39 PM Version By: Worthy Keeler PA-C Entered By: Rosalio Loud on 09/24/2022 17:11:32

## 2022-09-30 LAB — AEROBIC CULTURE W GRAM STAIN (SUPERFICIAL SPECIMEN)

## 2022-10-01 ENCOUNTER — Encounter: Payer: Medicare Other | Attending: Internal Medicine | Admitting: Internal Medicine

## 2022-10-01 DIAGNOSIS — I10 Essential (primary) hypertension: Secondary | ICD-10-CM | POA: Insufficient documentation

## 2022-10-01 DIAGNOSIS — L97822 Non-pressure chronic ulcer of other part of left lower leg with fat layer exposed: Secondary | ICD-10-CM | POA: Diagnosis not present

## 2022-10-01 DIAGNOSIS — Z853 Personal history of malignant neoplasm of breast: Secondary | ICD-10-CM | POA: Diagnosis not present

## 2022-10-01 DIAGNOSIS — Z7901 Long term (current) use of anticoagulants: Secondary | ICD-10-CM | POA: Insufficient documentation

## 2022-10-01 DIAGNOSIS — B964 Proteus (mirabilis) (morganii) as the cause of diseases classified elsewhere: Secondary | ICD-10-CM | POA: Insufficient documentation

## 2022-10-01 DIAGNOSIS — I87332 Chronic venous hypertension (idiopathic) with ulcer and inflammation of left lower extremity: Secondary | ICD-10-CM | POA: Diagnosis not present

## 2022-10-01 DIAGNOSIS — Z96651 Presence of right artificial knee joint: Secondary | ICD-10-CM | POA: Insufficient documentation

## 2022-10-01 DIAGNOSIS — I480 Paroxysmal atrial fibrillation: Secondary | ICD-10-CM | POA: Diagnosis not present

## 2022-10-03 NOTE — Progress Notes (Signed)
[name], [name] ([id number]) [id number] Page 1 of 8 Visit Report for 10/01/2022 Chief Complaint Document Details Patient Name: Date of Service: [name] Royals, DO RCA S 10/01/2022 12:30 PM Medical Record Number: [id number] Patient Account Number: [id number] Date of Birth/Sex: Treating RN: January 17, 1926 (89 y.o. [name] Primary Care Provider: Frazier Richards Other Clinician: Referring Provider: Treating Provider/Extender: RO BSO Delane Ginger, MICHA EL Donne Anon in Treatment: 11 Information Obtained from: Patient Chief Complaint Left lateral LE ulcer Electronic Signature(s) Signed: 10/01/2022 4:44:08 PM By: Linton Ham MD Signed: 10/01/2022 5:13:55 PM By: Rosalio Loud MSN RN CNS WTA Entered By: Rosalio Loud on 10/01/2022 13:15:00 -------------------------------------------------------------------------------- Debridement Details Patient Name: Date of Service: [name] Bufford Spikes, DO RCA S 10/01/2022 12:30 PM Medical Record Number: [id number] Patient Account Number: [id number] Date of Birth/Sex: Treating RN: 1926/03/06 (89 y.o. [name] Primary Care Provider: Frazier Richards Other Clinician: Referring Provider: Treating Provider/Extender: RO BSO Delane Ginger, MICHA EL Donne Anon in Treatment: 11 Debridement Performed for Assessment: Wound #2R Right,Anterior T Second oe Performed By: Physician Ricard Dillon, MD Debridement Type: Debridement Level of Consciousness (Pre-procedure): Awake and Alert Pre-procedure Verification/Time Out Yes - 13:11 Taken: Start Time: 13:11 T Area Debrided (L x W): otal 0.9 (cm) x 0.7 (cm) = 0.63 (cm) Tissue and other material debrided: Viable, Non-Viable, Slough, Subcutaneous, Slough Level: Skin/Subcutaneous Tissue Debridement Description: Excisional Instrument: Curette Bleeding: Moderate Hemostasis Achieved: Pressure Response to Treatment: Procedure was tolerated well Level of Consciousness (Post- Awake and  Alert procedure): [name], [name] ([id number]) [id number] Page 2 of 8 Post Debridement Measurements of Total Wound Length: (cm) 0.9 Stage: Category/Stage II Width: (cm) 0.7 Depth: (cm) 0.2 Volume: (cm) 0.099 Character of Wound/Ulcer Post Debridement: Stable Post Procedure Diagnosis Same as Pre-procedure Electronic Signature(s) Signed: 10/01/2022 4:44:08 PM By: Linton Ham MD Signed: 10/01/2022 5:13:55 PM By: Rosalio Loud MSN RN CNS WTA Entered By: Rosalio Loud on 10/01/2022 13:12:57 -------------------------------------------------------------------------------- HPI Details Patient Name: Date of Service: [name] Bufford Spikes, DO RCA S 10/01/2022 12:30 PM Medical Record Number: [id number] Patient Account Number: [id number] Date of Birth/Sex: Treating RN: [birth year] (89 y.o. [name] Primary Care Provider: Frazier Richards Other Clinician: Referring Provider: Treating Provider/Extender: Eldridge Dace, Gridley EL Donne Anon in Treatment: 11 History of Present Illness HPI Description: 07-16-2022 upon evaluation today patient presents for initial inspection here in our clinic concerning a wound on the left lateral lower extremity. This is an area that has been present since around May 27, 2022 according to what the patient tells me today. With that being said she does live in assisted living facility. She is seen with her son in the office today. As far as past medical history is concerned she does have a history of breast cancer in 2003 which she did obviously survive. She also had a right knee replacement. In 2009 she had an IVC filter and heart ablation due to atrial fibrillation. With regard to the remainder of her medical history again she is on anticoagulant therapy at this point due to atrial fibrillation, she has hypertension, and chronic venous insufficiency based on what I am seeing currently. In regard to the wound she is currently on Keflex  which should be completed on the 25th of this month. Other than that she has had various things applied to the wound including different creams but then at other times she also states that she has had nothing put on and been told to just leave it open to air. There is been a  lot of confusion about the best treatment course with regard to her wound which is obviously not good. I discussed with her today that I think we can have the best plan going forward and that that will include keeping this covered but nonetheless it should help it to heal the most effectively as well. 12/29; left lower leg chronic venous insufficiency. She complains fairly bitterly about the tightness of the wrap. She was put on antibiotics last week which I believe is Keflex. She lives in an assisted living 07-31-2021 upon evaluation today patient appears to be doing well currently in regard to her wound. She is actually been tolerating the dressing changes this is measuring smaller and looking better. I think we are on the right track I do believe that the current compression wrap is helping significantly with her Ealing at this point. Overall I do not see any signs of infection locally nor systemically which is great news. No fevers, chills, nausea, vomiting, or diarrhea. 08-13-2022 upon evaluation today patient's wound on her leg actually showing signs of erythema around the edges of the wound that she is also still having quite a bit of drainage all things considered. Fortunately I do not see any evidence of infection systemically though locally I definitely see some evidence here both with erythema, warmth, and drainage. She has been using the silver alginate dressing we have also had an 3 layer compression wrap though she did not feel good felt very tight at the top of the wrap which is the main concern she has today. 1/25; the patient's wound is smaller today. She is on doxycycline. Culture result from last week showed MRSA but  fortunately sensitive to doxycycline which she is taking. She is using TCA Sorbact under 3 layer compression. 08-27-2022 upon evaluation today patient appears to be doing well currently in regard to her left leg ulcer which does not appear to be doing poorly at all in fact this appears to be healing quite nicely. I am extremely pleased with where we stand I do not see any signs of active infection locally nor systemically which is great news. No fevers, chills, nausea, vomiting, or diarrhea. 09-03-2022 upon evaluation patient's wound is actually showing signs of significant improvement I am actually very pleased with where we stand currently. I do not see any signs of active knee flexion at this time which is great news. No fevers, chills, nausea, vomiting, or diarrhea. 09-10-2022 upon evaluation today patient appears to be doing well currently in regard to her wounds. Both the toe as well as the leg appear to be showing signs of good improvement. Fortunately I do not see any evidence of active infection at this time which is great news. She does have her compression socks. 09-24-2022 upon evaluation today patient's wound actually is showing signs of doing much worse than last time I [name] her in fact it was pretty much healed we were just seeing her today to ensure that it was still continue to be. Unfortunately not only is open that actually seems to be open down to the joint. I am somewhat concerned about how quickly this went downhill I am not sure if this is an indication of a severe infection that was hide he is in not apparent last time [name], [name] ([id number]) [id number] Page 3 of 8 although this is the worst the wound has been it was never even close to this bad when I was seeing her in the past several weeks. 3/6This is  a patient who came in last week with a opening over the proximal interphalangeal joint of the right second toe. Very painful. Her original wound was  on the left side. Culture done this week showed pansensitive Proteus the patient is on doxycycline. An x-ray came from the facility there was no fracture small amount of osteoarthritis but no obvious bony abnormalities. Electronic Signature(s) Signed: 10/01/2022 4:44:08 PM By: Linton Ham MD Entered By: Linton Ham on 10/01/2022 13:38:38 -------------------------------------------------------------------------------- Physical Exam Details Patient Name: Date of Service: [name] Bufford Spikes, DO RCA S 10/01/2022 12:30 PM Medical Record Number: [id number] Patient Account Number: [id number] Date of Birth/Sex: Treating RN: 05/15/1926 (89 y.o. [name] Primary Care Provider: Frazier Richards Other Clinician: Referring Provider: Treating Provider/Extender: RO BSO N, MICHA EL Donne Anon in Treatment: 11 Constitutional Sitting or standing Blood Pressure is within target range for patient.. Pulse regular and within target range for patient.Marland Kitchen Respirations regular, non-labored and within target range.. Temperature is normal and within the target range for the patient.Marland Kitchen appears in no distress. Notes Wound exam; right anterior wound over the PIP. Unfortunately this probes mostly to bone there is still some healthy looking tissue. Culture and x-ray have been done she is on doxycycline. Electronic Signature(s) Signed: 10/01/2022 4:44:08 PM By: Linton Ham MD Entered By: Linton Ham on 10/01/2022 13:39:30 -------------------------------------------------------------------------------- Physician Orders Details Patient Name: Date of Service: [name] Bufford Spikes, DO RCA S 10/01/2022 12:30 PM Medical Record Number: [id number] Patient Account Number: [id number] Date of Birth/Sex: Treating RN: [birth year] (89 y.o. [name] Primary Care Provider: Frazier Richards Other Clinician: Referring Provider: Treating Provider/Extender: RO BSO N, MICHA EL Donne Anon in Treatment:  11 Verbal / Phone Orders: No Diagnosis Coding Bathing/ Shower/ Hygiene Wound #2R Right,Anterior T Second oe May shower; gently cleanse wound with antibacterial soap, rinse and pat dry prior to dressing wounds [name], [name] ([id number]) [id number] Page 4 of 8 Edema Control - Lymphedema / Segmental Compressive Device / Other Bilateral Lower Extremities Patient to wear own compression stockings. Remove compression stockings every night before going to bed and put on every morning when getting up. - Wear compression stockings continuously except when due to shower. May remove to shower but replace after. Elevate, Exercise Daily and A void Standing for Long Periods of Time. Elevate legs to the level of the heart and pump ankles as often as possible Elevate leg(s) parallel to the floor when sitting. DO YOUR BEST to sleep in the bed at night. DO NOT sleep in your recliner. Long hours of sitting in a recliner leads to swelling of the legs and/or potential wounds on your backside. Additional Orders / Instructions Follow Nutritious Diet and Increase Protein Intake Wound Treatment Wound #2R - T Second oe Wound Laterality: Right, Anterior Cleanser: Soap and Water 1 x Per Day/30 Days Discharge Instructions: Gently cleanse wound with antibacterial soap, rinse and pat dry prior to dressing wounds Prim Dressing: Prisma 4.34 (in) 1 x Per Day/30 Days ary Discharge Instructions: Moisten w/normal saline or sterile water; Cover wound as directed. Do not remove from wound bed. Prim Dressing: Xeroform-HBD 2x2 (in/in) 1 x Per Day/30 Days ary Discharge Instructions: Apply Xeroform-HBD 2x2 (in/in) as directed Secondary Dressing: Conforming Guaze Roll-Small 1 x Per Day/30 Days Discharge Instructions: Alda as directed Secondary Dressing: Tubular Elastic Net Bandage, Size1, 30 (yd) 1 x Per Day/30 Days Electronic Signature(s) Signed: 10/01/2022 4:44:08  PM By: Linton Ham MD Signed: 10/01/2022 5:13:55 PM By: Rosalio Loud MSN  RN CNS WTA Entered By: Rosalio Loud on 10/01/2022 13:13:40 -------------------------------------------------------------------------------- Problem List Details Patient Name: Date of Service: [name] MSEY, DO RCA S 10/01/2022 12:30 PM Medical Record Number: [id number] Patient Account Number: [id number] Date of Birth/Sex: Treating RN: 01-19-1926 (89 y.o. [name] Primary Care Provider: Frazier Richards Other Clinician: Referring Provider: Treating Provider/Extender: Eldridge Dace, MICHA EL Donne Anon in Treatment: 11 Active Problems ICD-10 Encounter Code Description Active Date MDM Diagnosis I87.332 Chronic venous hypertension (idiopathic) with ulcer and inflammation of left 07/16/2022 No Yes lower extremity L97.822 Non-pressure chronic ulcer of other part of left lower leg with fat layer exposed12/21/2023 No Yes I10 Essential (primary) hypertension 07/16/2022 No Yes LESLIAN, BATTAGLINO ([id number]) [id number] Page 5 of 8 I48.0 Paroxysmal atrial fibrillation 07/16/2022 No Yes Z79.01 Long term (current) use of anticoagulants 07/16/2022 No Yes Inactive Problems Resolved Problems Electronic Signature(s) Signed: 10/01/2022 4:44:08 PM By: Linton Ham MD Entered By: Linton Ham on 10/01/2022 13:36:42 -------------------------------------------------------------------------------- Progress Note Details Patient Name: Date of Service: [name] Bufford Spikes, DO RCA S 10/01/2022 12:30 PM Medical Record Number: [id number] Patient Account Number: [id number] Date of Birth/Sex: Treating RN: October 27, 1925 (89 y.o. [name] Primary Care Provider: Frazier Richards Other Clinician: Referring Provider: Treating Provider/Extender: Eldridge Dace, MICHA EL Donne Anon in Treatment: 11 Subjective Chief Complaint Information obtained from Patient Left lateral LE ulcer History of  Present Illness (HPI) 07-16-2022 upon evaluation today patient presents for initial inspection here in our clinic concerning a wound on the left lateral lower extremity. This is an area that has been present since around May 27, 2022 according to what the patient tells me today. With that being said she does live in assisted living facility. She is seen with her son in the office today. As far as past medical history is concerned she does have a history of breast cancer in 2003 which she did obviously survive. She also had a right knee replacement. In 2009 she had an IVC filter and heart ablation due to atrial fibrillation. With regard to the remainder of her medical history again she is on anticoagulant therapy at this point due to atrial fibrillation, she has hypertension, and chronic venous insufficiency based on what I am seeing currently. In regard to the wound she is currently on Keflex which should be completed on the 25th of this month. Other than that she has had various things applied to the wound including different creams but then at other times she also states that she has had nothing put on and been told to just leave it open to air. There is been a lot of confusion about the best treatment course with regard to her wound which is obviously not good. I discussed with her today that I think we can have the best plan going forward and that that will include keeping this covered but nonetheless it should help it to heal the most effectively as well. 12/29; left lower leg chronic venous insufficiency. She complains fairly bitterly about the tightness of the wrap. She was put on antibiotics last week which I believe is Keflex. She lives in an assisted living 07-31-2021 upon evaluation today patient appears to be doing well currently in regard to her wound. She is actually been tolerating the dressing changes this is measuring smaller and looking better. I think we are on the right track I do  believe that the current compression wrap is helping significantly with her Ealing at this point. Overall I do not  see any signs of infection locally nor systemically which is great news. No fevers, chills, nausea, vomiting, or diarrhea. 08-13-2022 upon evaluation today patient's wound on her leg actually showing signs of erythema around the edges of the wound that she is also still having quite a bit of drainage all things considered. Fortunately I do not see any evidence of infection systemically though locally I definitely see some evidence here both with erythema, warmth, and drainage. She has been using the silver alginate dressing we have also had an 3 layer compression wrap though she did not feel good felt very tight at the top of the wrap which is the main concern she has today. 1/25; the patient's wound is smaller today. She is on doxycycline. Culture result from last week showed MRSA but fortunately sensitive to doxycycline which she is taking. She is using TCA Sorbact under 3 layer compression. 08-27-2022 upon evaluation today patient appears to be doing well currently in regard to her left leg ulcer which does not appear to be doing poorly at all in fact this appears to be healing quite nicely. I am extremely pleased with where we stand I do not see any signs of active infection locally nor systemically which is great news. No fevers, chills, nausea, vomiting, or diarrhea. 09-03-2022 upon evaluation patient's wound is actually showing signs of significant improvement I am actually very pleased with where we stand currently. I do not see any signs of active knee flexion at this time which is great news. No fevers, chills, nausea, vomiting, or diarrhea. [name], [name] ([id number]) [id number] Page 6 of 8 09-10-2022 upon evaluation today patient appears to be doing well currently in regard to her wounds. Both the toe as well as the leg appear to be showing signs of good  improvement. Fortunately I do not see any evidence of active infection at this time which is great news. She does have her compression socks. 09-24-2022 upon evaluation today patient's wound actually is showing signs of doing much worse than last time I [name] her in fact it was pretty much healed we were just seeing her today to ensure that it was still continue to be. Unfortunately not only is open that actually seems to be open down to the joint. I am somewhat concerned about how quickly this went downhill I am not sure if this is an indication of a severe infection that was hide he is in not apparent last time although this is the worst the wound has been it was never even close to this bad when I was seeing her in the past several weeks. 3/6This is a patient who came in last week with a opening over the proximal interphalangeal joint of the right second toe. Very painful. Her original wound was on the left side. Culture done this week showed pansensitive Proteus the patient is on doxycycline. An x-ray came from the facility there was no fracture small amount of osteoarthritis but no obvious bony abnormalities. Objective Constitutional Sitting or standing Blood Pressure is within target range for patient.. Pulse regular and within target range for patient.Marland Kitchen Respirations regular, non-labored and within target range.. Temperature is normal and within the target range for the patient.Marland Kitchen appears in no distress. Vitals Time Taken: 12:52 PM, Weight: 106 lbs, Temperature: 97.6 F, Pulse: 67 bpm, Respiratory Rate: 16 breaths/min, Blood Pressure: 143/73 mmHg. General Notes: Wound exam; right anterior wound over the PIP. Unfortunately this probes mostly to bone there is still some healthy looking tissue. Culture  and x-ray have been done she is on doxycycline. Integumentary (Hair, Skin) Wound #2R status is Open. Original cause of wound was Pressure Injury. The date acquired was: 09/14/2022. The wound has been in  treatment 5 weeks. The wound is located on the Right,Anterior T Second. The wound measures 0.9cm length x 0.7cm width x 0.1cm depth; 0.495cm^2 area and 0.049cm^3 volume. oe There is a medium amount of serous drainage noted. There is medium (34-66%) red, pink granulation within the wound bed. There is no necrotic tissue within the wound bed. Assessment Active Problems ICD-10 Chronic venous hypertension (idiopathic) with ulcer and inflammation of left lower extremity Non-pressure chronic ulcer of other part of left lower leg with fat layer exposed Essential (primary) hypertension Paroxysmal atrial fibrillation Long term (current) use of anticoagulants Procedures Wound #2R Pre-procedure diagnosis of Wound #2R is a Pressure Ulcer located on the Right,Anterior T Second . There was a Excisional Skin/Subcutaneous Tissue oe Debridement with a total area of 0.63 sq cm performed by Ricard Dillon, MD. With the following instrument(s): Curette to remove Viable and Non- Viable tissue/material. Material removed includes Subcutaneous Tissue and Slough and. No specimens were taken. A time out was conducted at 13:11, prior to the start of the procedure. A Moderate amount of bleeding was controlled with Pressure. The procedure was tolerated well. Post Debridement Measurements: 0.9cm length x 0.7cm width x 0.2cm depth; 0.099cm^3 volume. Post debridement Stage noted as Category/Stage II. Character of Wound/Ulcer Post Debridement is stable. Post procedure Diagnosis Wound #2R: Same as Pre-Procedure Plan Bathing/ Shower/ Hygiene: Wound #2R Right,Anterior T Second: oe May shower; gently cleanse wound with antibacterial soap, rinse and pat dry prior to dressing wounds Edema Control - Lymphedema / Segmental Compressive Device / Other: Patient to wear own compression stockings. Remove compression stockings every night before going to bed and put on every morning when getting up. - Wear compression stockings  continuously except when due to shower. May remove to shower but replace after. Elevate, Exercise Daily and Avoid Standing for Long Periods of Time. Elevate legs to the level of the heart and pump ankles as often as possible Elevate leg(s) parallel to the floor when sitting. DO YOUR BEST to sleep in the bed at night. DO NOT sleep in your recliner. Long hours of sitting in a recliner leads to swelling of the legs and/or potential [name], [name] ([id number]) [id number] Page 7 of 8 wounds on your backside. Additional Orders / Instructions: Follow Nutritious Diet and Increase Protein Intake WOUND #2R: - T Second Wound Laterality: Right, Anterior oe Cleanser: Soap and Water 1 x Per Day/30 Days Discharge Instructions: Gently cleanse wound with antibacterial soap, rinse and pat dry prior to dressing wounds Prim Dressing: Prisma 4.34 (in) 1 x Per Day/30 Days ary Discharge Instructions: Moisten w/normal saline or sterile water; Cover wound as directed. Do not remove from wound bed. Prim Dressing: Xeroform-HBD 2x2 (in/in) 1 x Per Day/30 Days ary Discharge Instructions: Apply Xeroform-HBD 2x2 (in/in) as directed Secondary Dressing: Conforming Guaze Roll-Small 1 x Per Day/30 Days Discharge Instructions: Apply Conforming Stretch Guaze Bandage as directed Secondary Dressing: Tubular Elastic Net Bandage, Size1, 30 (yd) 1 x Per Day/30 Days 1. We are going to continue with the Prisma Xeroform. 2. I have given her a surgical shoe to try and offload this area. 3. There is no evidence of systemic infection or surrounding infection but it clearly still goes to bone. Electronic Signature(s) Signed: 10/01/2022 4:44:08 PM By: Linton Ham MD Entered By: Linton Ham  on 10/01/2022 13:40:28 -------------------------------------------------------------------------------- SuperBill Details Patient Name: Date of Service: [name] Royals, DO RCA S 10/01/2022 Medical Record Number:  [id number] Patient Account Number: [id number] Date of Birth/Sex: Treating RN: Jan 22, 1926 (89 y.o. [name] Primary Care Provider: Frazier Richards Other Clinician: Referring Provider: Treating Provider/Extender: Eldridge Dace, MICHA EL Donne Anon in Treatment: 11 Diagnosis Coding ICD-10 Codes Code Description [phone] Chronic venous hypertension (idiopathic) with ulcer and inflammation of left lower extremity L97.822 Non-pressure chronic ulcer of other part of left lower leg with fat layer exposed I10 Essential (primary) hypertension I48.0 Paroxysmal atrial fibrillation Z79.01 Long term (current) use of anticoagulants Facility Procedures : CPT4 Code: [id number] Description: [id number] - DEB SUBQ TISSUE 20 SQ CM/< ICD-10 Diagnosis Description 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

## 2022-10-03 NOTE — Progress Notes (Signed)
ABAGAEL, REGINO ([id number]) [id number] Page 1 of 8 Visit Report for 10/01/2022 Arrival Information Details Patient Name: Date of Service: [name] Royals, DO RCA S 10/01/2022 12:30 PM Medical Record Number: [id number] Patient Account Number: [id number] Date of Birth/Sex: Treating RN: 12-Aug-1925 (89 y.o. [name]: Frazier Richards Other Clinician: Referring [name]: RO BSO [name], [name] in Treatment: 11 Visit Information History Since Last Visit Added or deleted any medications: No Patient Arrived: Walker Has Dressing in Place as Prescribed: Yes Arrival Time: 12:50 Pain Present Now: No Accompanied By: son Transfer Assistance: None Patient Identification Verified: Yes Secondary Verification Process Completed: Yes Patient Requires Transmission-Based Precautions: No Patient Has Alerts: Yes Patient Alerts: Patient on Blood Thinner NOT DIABETIC Eliquis Electronic Signature(s) Signed: 10/01/2022 5:13:55 PM By: Rosalio Loud MSN RN CNS WTA Entered By: Rosalio Loud on 10/01/2022 12:51:13 -------------------------------------------------------------------------------- Clinic Level of Care Assessment Details Patient Name: Date of Service: [name], DO RCA S 10/01/2022 12:30 PM Medical Record Number: [id number] Patient Account Number: [id number] Date of Birth/Sex: Treating RN: 01-14-1926 (89 y.o. [name] Primary Care [name]: Frazier Richards Other Clinician: Referring Jakeob Tullis: Treating Yeila Morro/Extender: [name], [place] EL Donne [name] in Treatment: 11 Clinic Level of Care Assessment Items TOOL 4 Quantity Score '[]'$  - 0 Use when only an EandM is performed on FOLLOW-UP visit ASSESSMENTS - Nursing Assessment / Reassessment '[]'$  - 0 Reassessment of Co-morbidities (includes updates in patient status) '[]'$  - 0 Reassessment of Adherence to Treatment Plan ASSESSMENTS - Wound  and Skin A ssessment / Reassessment '[]'$  - 0 Simple Wound Assessment / Reassessment - one wound [name], [name] ([id number]) [phone].pdf Page 2 of 8 '[]'$  - 0 Complex Wound Assessment / Reassessment - multiple wounds '[]'$  - 0 Dermatologic / Skin Assessment (not related to wound area) ASSESSMENTS - Focused Assessment '[]'$  - 0 Circumferential Edema Measurements - multi extremities '[]'$  - 0 Nutritional Assessment / Counseling / Intervention '[]'$  - 0 Lower Extremity Assessment (monofilament, tuning fork, pulses) '[]'$  - 0 Peripheral Arterial Disease Assessment (using hand held doppler) ASSESSMENTS - Ostomy and/or Continence Assessment and Care '[]'$  - 0 Incontinence Assessment and Management '[]'$  - 0 Ostomy Care Assessment and Management (repouching, etc.) PROCESS - Coordination of Care '[]'$  - 0 Simple Patient / Family Education for ongoing care '[]'$  - 0 Complex (extensive) Patient / Family Education for ongoing care '[]'$  - 0 Staff obtains Programmer, systems, Records, T Results / Process Orders est '[]'$  - 0 Staff telephones HHA, Nursing Homes / Clarify orders / etc '[]'$  - 0 Routine Transfer to another Facility (non-emergent condition) '[]'$  - 0 Routine Hospital Admission (non-emergent condition) '[]'$  - 0 New Admissions / Biomedical engineer / Ordering NPWT Apligraf, etc. , '[]'$  - 0 Emergency Hospital Admission (emergent condition) '[]'$  - 0 Simple Discharge Coordination '[]'$  - 0 Complex (extensive) Discharge Coordination PROCESS - Special Needs '[]'$  - 0 Pediatric / Minor Patient Management '[]'$  - 0 Isolation Patient Management '[]'$  - 0 Hearing / Language / Visual special needs '[]'$  - 0 Assessment of Community assistance (transportation, D/C planning, etc.) '[]'$  - 0 Additional assistance / Altered mentation '[]'$  - 0 Support Surface(s) Assessment (bed, cushion, seat, etc.) INTERVENTIONS - Wound Cleansing / Measurement '[]'$  - 0 Simple Wound Cleansing - one wound '[]'$  - 0 Complex Wound Cleansing -  multiple wounds '[]'$  - 0 Wound Imaging (photographs - any number of wounds) '[]'$  - 0 Wound Tracing (instead of photographs) '[]'$  - 0 Simple Wound Measurement - one wound '[]'$  -  0 Complex Wound Measurement - multiple wounds INTERVENTIONS - Wound Dressings '[]'$  - 0 Small Wound Dressing one or multiple wounds '[]'$  - 0 Medium Wound Dressing one or multiple wounds '[]'$  - 0 Large Wound Dressing one or multiple wounds '[]'$  - 0 Application of Medications - topical '[]'$  - 0 Application of Medications - injection INTERVENTIONS - Miscellaneous '[]'$  - 0 External ear exam '[]'$  - 0 Specimen Collection (cultures, biopsies, blood, body fluids, etc.) [name], [name] ([id number]) [id number] Page 3 of 8 '[]'$  - 0 Specimen(s) / Culture(s) sent or taken to Lab for analysis '[]'$  - 0 Patient Transfer (multiple staff / Harrel Lemon Lift / Similar devices) '[]'$  - 0 Simple Staple / Suture removal (25 or less) '[]'$  - 0 Complex Staple / Suture removal (26 or more) '[]'$  - 0 Hypo / Hyperglycemic Management (close monitor of Blood Glucose) '[]'$  - 0 Ankle / Brachial Index (ABI) - do not check if billed separately '[]'$  - 0 Vital Signs Has the patient been seen at the hospital within the last three years: Yes Total Score: 0 Level Of Care: ____ Electronic Signature(s) Signed: 10/01/2022 5:13:55 PM By: Rosalio Loud MSN RN CNS WTA Entered By: Rosalio Loud on 10/01/2022 13:13:52 -------------------------------------------------------------------------------- Encounter Discharge Information Details Patient Name: Date of Service: [name] Royals, DO RCA S 10/01/2022 12:30 PM Medical Record Number: [id number] Patient Account Number: [id number] Date of Birth/Sex: Treating RN: 1926/04/29 (89 y.o. [name] Primary Care [name]: Frazier Richards Other Clinician: Referring Princeton Nabor: Treating [name]/Extender: RO BSO N, [name] in Treatment: 11 Encounter Discharge Information Items Post Procedure  Vitals Discharge Condition: Stable Temperature (F): 97.6 Ambulatory Status: Walker Pulse (bpm): 67 Discharge Destination: Home Respiratory Rate (breaths/min): 16 Transportation: Private Auto Blood Pressure (mmHg): 143/73 Accompanied By: son Schedule Follow-up Appointment: Yes Clinical Summary of Care: Electronic Signature(s) Signed: 10/01/2022 5:13:55 PM By: Rosalio Loud MSN RN CNS WTA Entered By: Rosalio Loud on 10/01/2022 13:16:17 -------------------------------------------------------------------------------- Lower Extremity Assessment Details Patient Name: Date of Service: [name], DO RCA S 10/01/2022 12:30 PM Medical Record Number: [id number] Patient Account Number: [id number] Date of Birth/Sex: Treating RN: 1926-04-05 (88 y.o. [name] [place], [place] ([id number]) [id number] Page 4 of 8 Primary Care Lauris Keepers: Frazier Richards Other Clinician: Referring Amylah Will: Treating Juliet Vasbinder/Extender: RO BSO [name], [name] in Treatment: 11 Electronic Signature(s) Signed: 10/01/2022 5:13:55 PM By: Rosalio Loud MSN RN CNS WTA Entered By: Rosalio Loud on 10/01/2022 13:05:19 -------------------------------------------------------------------------------- Multi Wound Chart Details Patient Name: Date of Service: [name] Royals, DO RCA S 10/01/2022 12:30 PM Medical Record Number: [id number] Patient Account Number: [id number] Date of Birth/Sex: Treating RN: Mar 06, 1926 (89 y.o. [name] Primary Care Analena Gama: Frazier Richards Other Clinician: Referring Dayami Taitt: Treating Gift Rueckert/Extender: RO BSO N, [name] in Treatment: 11 Vital Signs Height(in): Pulse(bpm): 68 Weight(lbs): 106 Blood Pressure(mmHg): 143/73 Body Mass Index(BMI): Temperature(F): 97.6 Respiratory Rate(breaths/min): 16 [2R:Photos:] [N/A:N/A] Right, Anterior T Second oe N/A N/A Wound Location: Pressure Injury N/A N/A Wounding  Event: Pressure Ulcer N/A N/A Primary Etiology: Hypertension, Dementia, Received N/A N/A Comorbid History: Chemotherapy 09/14/2022 N/A N/A Date Acquired: 5 N/A N/A Weeks of Treatment: Open N/A N/A Wound Status: Yes N/A N/A Wound Recurrence: 0.9x0.7x0.1 N/A N/A Measurements L x W x D (cm) 0.495 N/A N/A A (cm) : rea 0.049 N/A N/A Volume (cm) : -74.90% N/A N/A % Reduction in A rea: -75.00% N/A N/A % Reduction in Volume: Category/Stage II N/A N/A Classification: Medium N/A N/A Exudate A mount: Serous  N/A N/A Exudate Type: amber N/A N/A Exudate Color: Medium (34-66%) N/A N/A Granulation A mount: Red, Pink N/A N/A Granulation Quality: None Present (0%) N/A N/A Necrotic A mount: Fascia: No N/A N/A Exposed Structures: Fat Layer (Subcutaneous Tissue): No Tendon: No Muscle: No Joint: No Bone: No Small (1-33%) N/A N/A EpithelializationREMMI, LANGERMAN ([id number]) [phone] Page 5 of 8 Treatment Notes Electronic Signature(s) Signed: 10/01/2022 5:13:55 PM By: Rosalio Loud MSN RN CNS WTA Entered By: Rosalio Loud on 10/01/2022 13:09:53 -------------------------------------------------------------------------------- Pain Assessment Details Patient Name: Date of Service: [name], DO RCA S 10/01/2022 12:30 PM Medical Record Number: [id number] Patient Account Number: [id number] Date of Birth/Sex: Treating RN: Mar 31, 1926 (89 y.o. [name] Primary Care Darrick Greenlaw: Frazier Richards Other Clinician: Referring Mikaela Hilgeman: Treating Randy Castrejon/Extender: RO BSO [name], [name] in Treatment: 11 Active Problems Location of Pain Severity and Description of Pain Patient Has Paino No Site Locations Pain Management and Medication Current Pain Management: Electronic Signature(s) Signed: 10/01/2022 5:13:55 PM By: Rosalio Loud MSN RN CNS WTA Entered By: Rosalio Loud on 10/01/2022 13:00:01 [name] ([id number])  [id number].pdf Page 6 of 8 -------------------------------------------------------------------------------- Patient/Caregiver Education Details Patient Name: Date of Service: [name] Royals, DO RCA S 3/7/2024andnbsp12:30 PM Medical Record Number: [id number] Patient Account Number: [id number] Date of Birth/Gender: Treating RN: February 17, 1926 (89 y.o. [name] Primary Care Physician: Frazier Richards Other Clinician: Referring Physician: Treating Physician/Extender: RO BSO N, [place] EL Donne [name] in Treatment: 11 Education Assessment Education Provided To: Patient Education Topics Provided Wound/Skin Impairment: Handouts: Caring for Your Ulcer Methods: Explain/Verbal Responses: State content correctly Electronic Signature(s) Signed: 10/01/2022 5:13:55 PM By: Rosalio Loud MSN RN CNS WTA Entered By: Rosalio Loud on 10/01/2022 13:14:41 -------------------------------------------------------------------------------- Wound Assessment Details Patient Name: Date of Service: [name], DO RCA S 10/01/2022 12:30 PM Medical Record Number: [id number] Patient Account Number: [id number] Date of Birth/Sex: Treating RN: [birth year] (89 y.o. [name] Primary Care Dajae Kizer: Frazier Richards Other Clinician: Referring Anja Neuzil: Treating Rosell Khouri/Extender: RO BSO N, [name] EL Marlou Starks Weeks in Treatment: 11 Wound Status Wound Number: [id number] Primary Etiology: Pressure Ulcer Wound Location: Right, Anterior T Second oe Wound Status: Open Wounding Event: Pressure Injury Comorbid History: Hypertension, Dementia, Received Chemotherapy Date Acquired: 09/14/2022 Weeks Of Treatment: 5 Clustered Wound: No Photos EMILINE, BURNES ([id number]) [id number] Page 7 of 8 Wound Measurements Length: (cm) 0.9 Width: (cm) 0.7 Depth: (cm) 0.1 Area: (cm) 0.495 Volume: (cm) 0.049 % Reduction in Area: -74.9% % Reduction in Volume:  -75% Epithelialization: Small (1-33%) Wound Description Classification: Category/Stage II Exudate Amount: Medium Exudate Type: Serous Exudate Color: amber Foul Odor After Cleansing: No Slough/Fibrino No Wound Bed Granulation Amount: Medium (34-66%) Exposed Structure Granulation Quality: Red, Pink Fascia Exposed: No Necrotic Amount: None Present (0%) Fat Layer (Subcutaneous Tissue) Exposed: No Tendon Exposed: No Muscle Exposed: No Joint Exposed: No Bone Exposed: No Treatment Notes Wound #2R (Toe Second) Wound Laterality: Right, Anterior Cleanser Soap and Water Discharge Instruction: Gently cleanse wound with antibacterial soap, rinse and pat dry prior to dressing wounds Peri-Wound Care Topical Primary Dressing Prisma 4.34 (in) Discharge Instruction: Moisten w/normal saline or sterile water; Cover wound as directed. Do not remove from wound bed. Xeroform-HBD 2x2 (in/in) Discharge Instruction: Apply Xeroform-HBD 2x2 (in/in) as directed Secondary Dressing Conforming [place] Roll-Small Discharge Instruction: Apply Conforming Stretch Guaze Bandage as directed Tubular Elastic Net Bandage, Size1, 30 (yd) Secured With Compression Wrap Compression Stockings Environmental education officer) Signed: 10/01/2022 5:13:55 PM By: Rosalio Loud MSN RN CNS WTA  Entered By: Rosalio Loud on 10/01/2022 13:05:03 [name] ([id number] Page 8 of 8 -------------------------------------------------------------------------------- Vitals Details Patient Name: Date of Service: [name] Royals, DO RCA S 10/01/2022 12:30 PM Medical Record Number: [id number] Patient Account Number: [id number] Date of Birth/Sex: Treating RN: [birth year] (89 y.o. [name] Primary Care Henri Baumler: Frazier Richards Other Clinician: Referring Faline Langer: Treating Elery Cadenhead/Extender: RO BSO N, [name] in Treatment: 11 Vital Signs Time Taken: 12:52 Temperature  (F): 97.6 Weight (lbs): 106 Pulse (bpm): 67 Respiratory Rate (breaths/min): 16 Blood Pressure (mmHg): 143/73 Reference Range: 80 - 120 mg / dl Electronic Signature(s) Signed: 10/01/2022 5:13:55 PM By: Rosalio Loud MSN RN CNS WTA Entered By: Rosalio Loud on 10/01/2022 12:59:55

## 2022-10-15 ENCOUNTER — Encounter: Payer: Medicare Other | Admitting: Physician Assistant

## 2022-10-15 DIAGNOSIS — I87332 Chronic venous hypertension (idiopathic) with ulcer and inflammation of left lower extremity: Secondary | ICD-10-CM | POA: Diagnosis not present

## 2022-10-16 NOTE — Progress Notes (Addendum)
FLEDA, BOGA ([id number]) [id number] Page 1 of 9 Visit Report for 10/15/2022 Chief Complaint Document Details Patient Name: Date of Service: [name], [name] 10/15/2022 2:15 PM Medical Record Number: [id number] Patient Account Number: [id number] Date of Birth/Sex: Treating RN: 09-Jul-1926 (89 y.o. [name] Primary Care Provider: Frazier Richards Other Clinician: Referring Provider: Treating Provider/Extender: [name] in Treatment: 13 Information Obtained from: Patient Chief Complaint Left lateral LE ulcer Electronic Signature([name]) Signed: 10/15/2022 3:01:17 PM By: Worthy Keeler PA-C Entered By: Worthy Keeler on 10/15/2022 15:01:16 -------------------------------------------------------------------------------- Debridement Details Patient Name: Date of Service: [name], [name] 10/15/2022 2:15 PM Medical Record Number: [id number] Patient Account Number: [id number] Date of Birth/Sex: Treating RN: Dec 03, 1925 (89 y.o. [name] Primary Care Provider: Frazier Richards Other Clinician: Referring Provider: Treating Provider/Extender: [name] in Treatment: 13 Debridement Performed for Assessment: Wound #2R Right,Anterior T Second oe Performed By: Physician Tommie Sams., PA-C Debridement Type: Debridement Level of Consciousness (Pre-procedure): Awake and Alert Pre-procedure Verification/Time Out Yes - 15:06 Taken: Start Time: 15:06 Pain Control: Lidocaine 4% T opical Solution T Area Debrided (L x W): otal 0.6 (cm) x 0.6 (cm) = 0.36 (cm) Tissue and other material debrided: Viable, Non-Viable, Slough, Subcutaneous, Slough Level: Skin/Subcutaneous Tissue Debridement Description: Excisional Instrument: Curette Bleeding: Minimum Hemostasis Achieved: Pressure Response to Treatment: Procedure was tolerated well Level of Consciousness (Post- Awake and  Alert procedure): [name], [name] ([id number]) [id number] Page 2 of 9 Post Debridement Measurements of Total Wound Length: (cm) 0.6 Stage: Category/Stage II Width: (cm) 0.6 Depth: (cm) 0.3 Volume: (cm) 0.085 Character of Wound/Ulcer Post Debridement: Stable Post Procedure Diagnosis Same as Pre-procedure Electronic Signature([name]) Signed: 10/15/2022 3:58:45 PM By: Levora Dredge Signed: 10/15/2022 5:06:45 PM By: Worthy Keeler PA-C Entered By: Levora Dredge on 10/15/2022 15:09:13 -------------------------------------------------------------------------------- HPI Details Patient Name: Date of Service: [name] Bufford [name], [name] 10/15/2022 2:15 PM Medical Record Number: [id number] Patient Account Number: [id number] Date of Birth/Sex: Treating RN: 12/16/1925 (89 y.o. [name] Primary Care Provider: Frazier Richards Other Clinician: Referring Provider: Treating Provider/Extender: [name] in Treatment: 13 History of Present Illness HPI Description: 07-16-2022 upon evaluation today patient presents for initial inspection here in our clinic concerning a wound on the left lateral lower extremity. This is an area that has been present since around May 27, 2022 according to what the patient tells me today. With that being said she does live in assisted living facility. She is seen with her son in the office today. As far as past medical history is concerned she does have a history of breast cancer in 2003 which she did obviously survive. She also had a right knee replacement. In 2009 she had an IVC filter and heart ablation due to atrial fibrillation. With regard to the remainder of her medical history again she is on anticoagulant therapy at this point due to atrial fibrillation, she has hypertension, and chronic venous insufficiency based on what I am seeing currently. In regard to the wound she is currently on Keflex which should  be completed on the 25th of this month. Other than that she has had various things applied to the wound including different creams but then at other times she also states that she has had nothing put on and been told to just leave it open to air. There is been a lot of confusion about the best treatment course with regard to her wound which is obviously not  good. I discussed with her today that I think we can have the best plan going forward and that that will include keeping this covered but nonetheless it should help it to heal the most effectively as well. 12/29; left lower leg chronic venous insufficiency. She complains fairly bitterly about the tightness of the wrap. She was put on antibiotics last week which I believe is Keflex. She lives in an assisted living 07-31-2021 upon evaluation today patient appears to be doing well currently in regard to her wound. She is actually been tolerating the dressing changes this is measuring smaller and looking better. I think we are on the right track I [name] believe that the current compression wrap is helping significantly with her Ealing at this point. Overall I [name] not see any signs of infection locally nor systemically which is great news. No fevers, chills, nausea, vomiting, or diarrhea. 08-13-2022 upon evaluation today patient'[name] wound on her leg actually showing signs of erythema around the edges of the wound that she is also still having quite a bit of drainage all things considered. Fortunately I [name] not see any evidence of infection systemically though locally I definitely see some evidence here both with erythema, warmth, and drainage. She has been using the silver alginate dressing we have also had an 3 layer compression wrap though she did not feel good felt very tight at the top of the wrap which is the main concern she has today. 1/25; the patient'[name] wound is smaller today. She is on doxycycline. Culture result from last week showed MRSA but fortunately  sensitive to doxycycline which she is taking. She is using TCA Sorbact under 3 layer compression. 08-27-2022 upon evaluation today patient appears to be doing well currently in regard to her left leg ulcer which does not appear to be doing poorly at all in fact this appears to be healing quite nicely. I am extremely pleased with where we stand I [name] not see any signs of active infection locally nor systemically which is great news. No fevers, chills, nausea, vomiting, or diarrhea. 09-03-2022 upon evaluation patient'[name] wound is actually showing signs of significant improvement I am actually very pleased with where we stand currently. I [name] not see any signs of active knee flexion at this time which is great news. No fevers, chills, nausea, vomiting, or diarrhea. 09-10-2022 upon evaluation today patient appears to be doing well currently in regard to her wounds. Both the toe as well as the leg appear to be showing signs of good improvement. Fortunately I [name] not see any evidence of active infection at this time which is great news. She does have her compression socks. 09-24-2022 upon evaluation today patient'[name] wound actually is showing signs of doing much worse than last time I saw her in fact it was pretty much healed we were just seeing her today to ensure that it was still continue to be. Unfortunately not only is open that actually seems to be open down to the joint. I am somewhat concerned about how quickly this went downhill I am not sure if this is an indication of a severe infection that was hide he is in not apparent last time TALLY, LAMARQUE ([id number]) [id number] Page 3 of 9 although this is the worst the wound has been it was never even [name] to this bad when I was seeing her in the past several weeks. 3/6This is a patient who came in last week with a opening over the proximal interphalangeal joint of the  right second toe. Very painful. Her original wound was on the left  side. Culture done this week showed pansensitive Proteus the patient is on doxycycline. An x-ray came from the facility there was no fracture small amount of osteoarthritis but no obvious bony abnormalities. 10-15-2022 upon evaluation today patient'[name] toe ulcer actually showed signs of improvement compared to last time I saw her. With that being said this is definitely not looking as good as what we previously saw when she was healed but is looking better than last time I saw her right before going out of town. I am going to perform some sharp debridement today however to clear away some of the necrotic debris and I discussed that with the patient and her son. Electronic Signature([name]) Signed: 10/15/2022 4:12:51 PM By: Worthy Keeler PA-C Entered By: Worthy Keeler on 10/15/2022 16:12:51 -------------------------------------------------------------------------------- Physical Exam Details Patient Name: Date of Service: [name], [name] 10/15/2022 2:15 PM Medical Record Number: [id number] Patient Account Number: [id number] Date of Birth/Sex: Treating RN: 07/22/1926 (89 y.o. [name] Primary Care Provider: Frazier Richards Other Clinician: Referring Provider: Treating Provider/Extender: [name] in Treatment: 67 Constitutional Well-nourished and well-hydrated in no acute distress. Respiratory normal breathing without difficulty. Psychiatric this patient is able to make decisions and demonstrates good insight into disease process. Alert and Oriented x 3. pleasant and cooperative. Notes Upon inspection patient'[name] wound bed actually showed signs of good granulation epithelization in some areas there was some slough and biofilm noted I did perform debridement to clear this away postdebridement she did have still bone exposed in the base of the wound but this is looking much better with more granulation tissue compared to what was noted previous. Electronic  Signature([name]) Signed: 10/15/2022 4:13:11 PM By: Worthy Keeler PA-C Entered By: Worthy Keeler on 10/15/2022 16:13:10 -------------------------------------------------------------------------------- Physician Orders Details Patient Name: Date of Service: [name], [name] 10/15/2022 2:15 PM Medical Record Number: [id number] Patient Account Number: [id number] Date of Birth/Sex: Treating RN: 1925/08/19 (89 y.o. [name] Primary Care Provider: Frazier Richards Other Clinician: Referring Provider: Treating Provider/Extender: Elvina Sidle Norris, [name] ([id number]) [id number] Page 4 of 9 Weeks in Treatment: 13 Verbal / Phone Orders: No Diagnosis Coding ICD-10 Coding Code Description I87.332 Chronic venous hypertension (idiopathic) with ulcer and inflammation of left lower extremity L97.822 Non-pressure chronic ulcer of other part of left lower leg with fat layer exposed I10 Essential (primary) hypertension I48.0 Paroxysmal atrial fibrillation Z79.01 Long term (current) use of anticoagulants Bathing/ Shower/ Hygiene Wound #2R Right,Anterior T Second oe May shower; gently cleanse wound with antibacterial soap, rinse and pat dry prior to dressing wounds Edema Control - Lymphedema / Segmental Compressive Device / Other Bilateral Lower Extremities Patient to wear own compression stockings. Remove compression stockings every night before going to bed and put on every morning when getting up. - Wear compression stockings continuously except when due to shower. May remove to shower but replace after. Elevate, Exercise Daily and A void Standing for Long Periods of Time. Elevate legs to the level of the heart and pump ankles as often as possible Elevate leg([name]) parallel to the floor when sitting. [name] YOUR BEST to sleep in the bed at night. [name] NOT sleep in your recliner. Long hours of sitting in a recliner leads to swelling of the legs and/or  potential wounds on your backside. Additional Orders / Instructions Follow Nutritious Diet and Increase Protein Intake Wound Treatment Wound #2R -  T Second oe Wound Laterality: Right, Anterior Cleanser: Soap and Water 1 x Per Day/30 Days Discharge Instructions: Gently cleanse wound with antibacterial soap, rinse and pat dry prior to dressing wounds Prim Dressing: Prisma 4.34 (in) 1 x Per Day/30 Days ary Discharge Instructions: Moisten w/normal saline or sterile water; Cover wound as directed. [name] not remove from wound bed. Prim Dressing: Xeroform-HBD 2x2 (in/in) 1 x Per Day/30 Days ary Discharge Instructions: Apply Xeroform-HBD 2x2 (in/in) as directed Secondary Dressing: Conforming [place] Roll-Small 1 x Per Day/30 Days Discharge Instructions: Apply Conforming Stretch [place] as directed Secondary Dressing: Tubular Elastic Net Bandage, Size1, 30 (yd) 1 x Per Day/30 Days Patient Medications llergies: erythromycin base, Sulfa (Sulfonamide Antibiotics), wheat A Notifications Medication Indication Start End 10/15/2022 cefdinir DOSE 1 - oral 300 mg capsule - 1 capsule oral twice a day x 30 days Electronic Signature([name]) Signed: 10/15/2022 4:24:22 PM By: Worthy Keeler PA-C Previous Signature: 10/15/2022 3:58:45 PM Version By: Levora Dredge Entered By: Worthy Keeler on 10/15/2022 16:24:21 [name] ([id number]) [id number] Page 5 of 9 Prescription 10/15/2022 -------------------------------------------------------------------------------- [name] Schwab PA-C Patient Name: Provider: October 27, 1925 [id number] Date of Birth: NPI#: [id number] Sex: DEA #: [phone] Phone #: License #: [place] Patient Address: [place], [place] [address] [address], [place], [place] [address] [phone] Allergies erythromycin base; Sulfa  (Sulfonamide Antibiotics); wheat Medication Medication: Route: Strength: Form: cefdinir oral 300 mg capsule Class: CEPHALOSPORIN ANTIBIOTICS - 3RD GENERATION Dose: Frequency / Time: Indication: 1 1 capsule oral twice a day x 30 days Number of Refills: Number of Units: 0 Sixty (60) Capsule([name]) Generic Substitution: Start Date: End Date: Administered at Facility: Substitution Permitted [id number] No Note to Pharmacy: Hand Signature: Date([name]): Electronic Signature([name]) Signed: 10/15/2022 5:06:45 PM By: Worthy Keeler PA-C Entered By: Worthy Keeler on 10/15/2022 16:24:22 -------------------------------------------------------------------------------- Problem List Details Patient Name: Date of Service: [name] Bufford [name], [name] 10/15/2022 2:15 PM Medical Record Number: [id number] Patient Account Number: [id number] Date of Birth/Sex: Treating RN: 1925/09/11 (89 y.o. [name] Primary Care Provider: Frazier Richards Other Clinician: Referring Provider: Treating Provider/Extender: [name] in Treatment: [address] Active Problems ICD-10 Encounter [name], [name] ([id number]) [id number] Page 6 of 9 Encounter Code Description Active Date MDM Diagnosis I87.332 Chronic venous hypertension (idiopathic) with ulcer and inflammation of left 07/16/2022 No Yes lower extremity L97.822 Non-pressure chronic ulcer of other part of left lower leg with fat layer exposed12/21/2023 No Yes I10 Essential (primary) hypertension 07/16/2022 No Yes I48.0 Paroxysmal atrial fibrillation 07/16/2022 No Yes Z79.01 Long term (current) use of anticoagulants 07/16/2022 No Yes Inactive Problems Resolved Problems Electronic Signature([name]) Signed: 10/15/2022 3:01:13 PM By: Worthy Keeler PA-C Entered By: Worthy Keeler on 10/15/2022 15:01:13 -------------------------------------------------------------------------------- Progress Note Details Patient Name: Date  of Service: [name], [name] 10/15/2022 2:15 PM Medical Record Number: [id number] Patient Account Number: [id number] Date of Birth/Sex: Treating RN: May 01, 1926 (89 y.o. [name] Primary Care Provider: Frazier Richards Other Clinician: Referring Provider: Treating Provider/Extender: [name] in Treatment: 13 Subjective Chief Complaint Information obtained from Patient Left lateral LE ulcer History of Present Illness (HPI) 07-16-2022 upon evaluation today patient presents for initial inspection here in our clinic concerning a wound on the left lateral lower extremity. This is an area that has been present since around May 27, 2022 according to what the patient tells me today. With that being said  she does live in assisted living facility. She is seen with her son in the office today. As far as past medical history is concerned she does have a history of breast cancer in 2003 which she did obviously survive. She also had a right knee replacement. In 2009 she had an IVC filter and heart ablation due to atrial fibrillation. With regard to the remainder of her medical history again she is on anticoagulant therapy at this point due to atrial fibrillation, she has hypertension, and chronic venous insufficiency based on what I am seeing currently. In regard to the wound she is currently on Keflex which should be completed on the 25th of this month. Other than that she has had various things applied to the wound including different creams but then at other times she also states that she has had nothing put on and been told to just leave it open to air. There is been a lot of confusion about the best treatment course with regard to her wound which is obviously not good. I discussed with her today that I think we can have the best plan going forward and that that will include keeping this covered but nonetheless it should help it to heal the most effectively as  well. 12/29; left lower leg chronic venous insufficiency. She complains fairly bitterly about the tightness of the wrap. She was put on antibiotics last week which I believe is Keflex. She lives in an assisted living 07-31-2021 upon evaluation today patient appears to be doing well currently in regard to her wound. She is actually been tolerating the dressing changes this is MIANA, LENSCH ([id number]) [id number] Page 7 of 9 measuring smaller and looking better. I think we are on the right track I [name] believe that the current compression wrap is helping significantly with her Ealing at this point. Overall I [name] not see any signs of infection locally nor systemically which is great news. No fevers, chills, nausea, vomiting, or diarrhea. 08-13-2022 upon evaluation today patient'[name] wound on her leg actually showing signs of erythema around the edges of the wound that she is also still having quite a bit of drainage all things considered. Fortunately I [name] not see any evidence of infection systemically though locally I definitely see some evidence here both with erythema, warmth, and drainage. She has been using the silver alginate dressing we have also had an 3 layer compression wrap though she did not feel good felt very tight at the top of the wrap which is the main concern she has today. 1/25; the patient'[name] wound is smaller today. She is on doxycycline. Culture result from last week showed MRSA but fortunately sensitive to doxycycline which she is taking. She is using TCA Sorbact under 3 layer compression. 08-27-2022 upon evaluation today patient appears to be doing well currently in regard to her left leg ulcer which does not appear to be doing poorly at all in fact this appears to be healing quite nicely. I am extremely pleased with where we stand I [name] not see any signs of active infection locally nor systemically which is great news. No fevers, chills, nausea, vomiting, or  diarrhea. 09-03-2022 upon evaluation patient'[name] wound is actually showing signs of significant improvement I am actually very pleased with where we stand currently. I [name] not see any signs of active knee flexion at this time which is great news. No fevers, chills, nausea, vomiting, or diarrhea. 09-10-2022 upon evaluation today patient appears to be doing well currently in  regard to her wounds. Both the toe as well as the leg appear to be showing signs of good improvement. Fortunately I [name] not see any evidence of active infection at this time which is great news. She does have her compression socks. 09-24-2022 upon evaluation today patient'[name] wound actually is showing signs of doing much worse than last time I saw her in fact it was pretty much healed we were just seeing her today to ensure that it was still continue to be. Unfortunately not only is open that actually seems to be open down to the joint. I am somewhat concerned about how quickly this went downhill I am not sure if this is an indication of a severe infection that was hide he is in not apparent last time although this is the worst the wound has been it was never even [name] to this bad when I was seeing her in the past several weeks. 3/6This is a patient who came in last week with a opening over the proximal interphalangeal joint of the right second toe. Very painful. Her original wound was on the left side. Culture done this week showed pansensitive Proteus the patient is on doxycycline. An x-ray came from the facility there was no fracture small amount of osteoarthritis but no obvious bony abnormalities. 10-15-2022 upon evaluation today patient'[name] toe ulcer actually showed signs of improvement compared to last time I saw her. With that being said this is definitely not looking as good as what we previously saw when she was healed but is looking better than last time I saw her right before going out of town. I am going to perform some sharp  debridement today however to clear away some of the necrotic debris and I discussed that with the patient and her son. Objective Constitutional Well-nourished and well-hydrated in no acute distress. Vitals Time Taken: 2:24 PM, Weight: 106 lbs, Temperature: 97.7 F, Pulse: 73 bpm, Respiratory Rate: 18 breaths/min, Blood Pressure: 156/75 mmHg. Respiratory normal breathing without difficulty. Psychiatric this patient is able to make decisions and demonstrates good insight into disease process. Alert and Oriented x 3. pleasant and cooperative. General Notes: Upon inspection patient'[name] wound bed actually showed signs of good granulation epithelization in some areas there was some slough and biofilm noted I did perform debridement to clear this away postdebridement she did have still bone exposed in the base of the wound but this is looking much better with more granulation tissue compared to what was noted previous. Integumentary (Hair, Skin) Wound #2R status is Open. Original cause of wound was Pressure Injury. The date acquired was: 09/14/2022. The wound has been in treatment 7 weeks. The wound is located on the Right,Anterior T Second. The wound measures 0.6cm length x 0.6cm width x 0.1cm depth; 0.283cm^2 area and 0.028cm^3 volume. oe There is Fat Layer (Subcutaneous Tissue) exposed. There is no tunneling or undermining noted. There is a medium amount of serous drainage noted. There is small (1-33%) red, pink granulation within the wound bed. There is a large (67-100%) amount of necrotic tissue within the wound bed including Adherent Slough. Assessment Active Problems ICD-10 Chronic venous hypertension (idiopathic) with ulcer and inflammation of left lower extremity Non-pressure chronic ulcer of other part of left lower leg with fat layer exposed Essential (primary) hypertension Paroxysmal atrial fibrillation Long term (current) use of anticoagulants Procedures Wound #2R [name], [name]  ([id number]) [id number] Page 8 of 9 Pre-procedure diagnosis of Wound #2R is a Pressure Ulcer located on the Right,Anterior T Second .  There was a Excisional Skin/Subcutaneous Tissue oe Debridement with a total area of 0.36 sq cm performed by Tommie Sams., PA-C. With the following instrument([name]): Curette to remove Viable and Non-Viable tissue/material. Material removed includes Subcutaneous Tissue and Slough and after achieving pain control using Lidocaine 4% T opical Solution. No specimens were taken. A time out was conducted at 15:06, prior to the start of the procedure. A Minimum amount of bleeding was controlled with Pressure. The procedure was tolerated well. Post Debridement Measurements: 0.6cm length x 0.6cm width x 0.3cm depth; 0.085cm^3 volume. Post debridement Stage noted as Category/Stage II. Character of Wound/Ulcer Post Debridement is stable. Post procedure Diagnosis Wound #2R: Same as Pre-Procedure Plan Bathing/ Shower/ Hygiene: Wound #2R Right,Anterior T Second: oe May shower; gently cleanse wound with antibacterial soap, rinse and pat dry prior to dressing wounds Edema Control - Lymphedema / Segmental Compressive Device / Other: Patient to wear own compression stockings. Remove compression stockings every night before going to bed and put on every morning when getting up. - Wear compression stockings continuously except when due to shower. May remove to shower but replace after. Elevate, Exercise Daily and Avoid Standing for Long Periods of Time. Elevate legs to the level of the heart and pump ankles as often as possible Elevate leg([name]) parallel to the floor when sitting. [name] YOUR BEST to sleep in the bed at night. [name] NOT sleep in your recliner. Long hours of sitting in a recliner leads to swelling of the legs and/or potential wounds on your backside. Additional Orders / Instructions: Follow Nutritious Diet and Increase Protein Intake The following  medication([name]) was prescribed: cefdinir oral 300 mg capsule 1 1 capsule oral twice a day x 30 days starting 10/15/2022 WOUND #2R: - T Second Wound Laterality: Right, Anterior oe Cleanser: Soap and Water 1 x Per Day/30 Days Discharge Instructions: Gently cleanse wound with antibacterial soap, rinse and pat dry prior to dressing wounds Prim Dressing: Prisma 4.34 (in) 1 x Per Day/30 Days ary Discharge Instructions: Moisten w/normal saline or sterile water; Cover wound as directed. [name] not remove from wound bed. Prim Dressing: Xeroform-HBD 2x2 (in/in) 1 x Per Day/30 Days ary Discharge Instructions: Apply Xeroform-HBD 2x2 (in/in) as directed Secondary Dressing: Conforming Guaze Roll-Small 1 x Per Day/30 Days Discharge Instructions: Apply Conforming Stretch Guaze Bandage as directed Secondary Dressing: Tubular Elastic Net Bandage, Size1, 30 (yd) 1 x Per Day/30 Days 1. I am good recommend that we have the patient continue to monitor for any signs of worsening she is actually at a facility so that we keep a [name] eye on things. With that being said I [name] believe that she needs to have a switch in antibiotics as the doxycycline is apparently making her very sick. I did write an order to discontinue doxycycline and actually can actually switch her to [facility] and I did send the prescription for this to the facility for her as well. 2. I am also can recommend that we have the patient continue with the Xeroform gauze over top of the collagen. 3. I am also can recommend the patient should continue with a stretch net to hold in place. 4. I am to still recommend that we have the patient have this dressing change daily I think that is of utmost importance. We will see patient back for reevaluation in 1 week here in the clinic. If anything worsens or changes patient will contact our office for additional recommendations. Electronic Signature([name]) Signed: 10/15/2022 4:24:51 PM By: Melburn Hake,  Margarita Grizzle PA-C Previous  Signature: 10/15/2022 4:23:10 PM Version By: Worthy Keeler PA-C Entered By: Worthy Keeler on 10/15/2022 16:24:51 -------------------------------------------------------------------------------- SuperBill Details Patient Name: Date of Service: [name], [name] 10/15/2022 Medical Record Number: [id number] Patient Account Number: [id number] Date of Birth/Sex: Treating RN: 10/30/1925 (89 y.o. [name] Primary Care Provider: Frazier Richards Other Clinician: Referring Provider: Treating Provider/Extender: [name] in Treatment: [address], [place] ([id number]) [id number] Page 9 of 9 Diagnosis Coding ICD-10 Codes Code Description I87.332 Chronic venous hypertension (idiopathic) with ulcer and inflammation of left lower extremity L97.822 Non-pressure chronic ulcer of other part of left lower leg with fat layer exposed I10 Essential (primary) hypertension I48.0 Paroxysmal atrial fibrillation Z79.01 Long term (current) use of anticoagulants Facility Procedures : CPT4 Code: [id number] Description: [id number] - DEB SUBQ TISSUE 20 SQ CM/< ICD-10 Diagnosis Description L97.822 Non-pressure chronic ulcer of other part of left lower leg with fat layer expo Modifier: sed Quantity: 1 Physician Procedures : CPT4 Code Description Modifier [id number] - WC PHYS SUBQ TISS 20 SQ CM ICD-10 Diagnosis Description L97.822 Non-pressure chronic ulcer of other part of left lower leg with fat layer exposed Quantity: 1 Electronic Signature([name]) Signed: 10/15/2022 4:24:59 PM By: Worthy Keeler PA-C Entered By: Worthy Keeler on 10/15/2022 16:24:59

## 2022-10-16 NOTE — Progress Notes (Signed)
JASONNA, VANROSSUM ([id number]) [id number] Page 1 of 8 Visit Report for 10/15/2022 Arrival Information Details Patient Name: Date of Service: [name] Royals, DO RCA S 10/15/2022 2:15 PM Medical Record Number: [id number] Patient Account Number: [id number] Date of Birth/Sex: Treating RN: [birth year] (89 y.o. [name] Primary Care Chonita Gadea: Frazier Richards Other Clinician: Referring Jaquia Benedicto: Treating Maily Debarge/Extender: [name] in Treatment: 66 Visit Information History Since Last Visit Added or deleted any medications: No Patient Arrived: Gilford Rile Any new allergies or adverse reactions: No Arrival Time: 14:23 Had a fall or experienced change in No Accompanied By: son activities of daily living that may affect Transfer Assistance: EasyPivot Patient Lift risk of falls: Patient Identification Verified: Yes Hospitalized since last visit: No Secondary Verification Process Completed: Yes Has Dressing in Place as Prescribed: Yes Patient Requires Transmission-Based Precautions: No Pain Present Now: No Patient Has Alerts: Yes Patient Alerts: Patient on Blood Thinner NOT DIABETIC Eliquis Electronic Signature(s) Signed: 10/15/2022 3:58:45 PM By: Levora Dredge Entered By: Levora Dredge on 10/15/2022 14:24:52 -------------------------------------------------------------------------------- Clinic Level of Care Assessment Details Patient Name: Date of Service: [name] Royals, DO RCA S 10/15/2022 2:15 PM Medical Record Number: [id number] Patient Account Number: [id number] Date of Birth/Sex: Treating RN: [birth year] (89 y.o. [name] Primary Care Eren Ryser: Frazier Richards Other Clinician: Referring Lan Mcneill: Treating Abdelrahman Nair/Extender: [name] in Treatment: 13 Clinic Level of Care Assessment Items TOOL 1 Quantity Score []  - 0 Use when EandM and Procedure is performed on INITIAL visit ASSESSMENTS -  Nursing Assessment / Reassessment []  - 0 General Physical Exam (combine w/ comprehensive assessment (listed just below) when performed on new pt. evals) []  - 0 Comprehensive Assessment (HX, ROS, Risk Assessments, Wounds Hx, etc.) ASSESSMENTS - Wound and Skin Assessment / Reassessment []  - 0 Dermatologic / Skin Assessment (not related to wound area) [name], [name] ([id number]) [id number] Page 2 of 8 ASSESSMENTS - Ostomy and/or Continence Assessment and Care []  - 0 Incontinence Assessment and Management []  - 0 Ostomy Care Assessment and Management (repouching, etc.) PROCESS - Coordination of Care []  - 0 Simple Patient / Family Education for ongoing care []  - 0 Complex (extensive) Patient / Family Education for ongoing care []  - 0 Staff obtains Programmer, systems, Records, T Results / Process Orders est []  - 0 Staff telephones HHA, Nursing Homes / Clarify orders / etc []  - 0 Routine Transfer to another Facility (non-emergent condition) []  - 0 Routine Hospital Admission (non-emergent condition) []  - 0 New Admissions / Biomedical engineer / Ordering NPWT Apligraf, etc. , []  - 0 Emergency Hospital Admission (emergent condition) PROCESS - Special Needs []  - 0 Pediatric / Minor Patient Management []  - 0 Isolation Patient Management []  - 0 Hearing / Language / Visual special needs []  - 0 Assessment of Community assistance (transportation, D/C planning, etc.) []  - 0 Additional assistance / Altered mentation []  - 0 Support Surface(s) Assessment (bed, cushion, seat, etc.) INTERVENTIONS - Miscellaneous []  - 0 External ear exam []  - 0 Patient Transfer (multiple staff / Civil Service fast streamer / Similar devices) []  - 0 Simple Staple / Suture removal (25 or less) []  - 0 Complex Staple / Suture removal (26 or more) []  - 0 Hypo/Hyperglycemic Management (do not check if billed separately) []  - 0 Ankle / Brachial Index (ABI) - do not check if billed separately Has the  patient been seen at the hospital within the last three years: Yes Total Score: 0 Level Of Care: ____ Electronic Signature(s) Signed: 10/15/2022 3:58:45 PM By: Levora Dredge  Entered By: Levora Dredge on 10/15/2022 15:23:27 -------------------------------------------------------------------------------- Encounter Discharge Information Details Patient Name: Date of Service: [name] MSEY, DO RCA S 10/15/2022 2:15 PM Medical Record Number: [id number] Patient Account Number: [id number] Date of Birth/Sex: Treating RN: [birth year] (89 y.o. [name] Primary Care Kayshaun Polanco: Frazier Richards Other Clinician: Referring Katiya Fike: Treating Elfreda Blanchet/Extender: [name] in Treatment: 13 Encounter Discharge Information Items Post Procedure [name], [name] ([id number]) [id number] Page 3 of 8 Discharge Condition: Stable Temperature (F): 97.7 Ambulatory Status: Walker Pulse (bpm): 73 Discharge Destination: Home Respiratory Rate (breaths/min): 18 Transportation: Private Auto Blood Pressure (mmHg): 156/77 Accompanied By: son Schedule Follow-up Appointment: Yes Clinical Summary of Care: Electronic Signature(s) Signed: 10/15/2022 3:58:45 PM By: Levora Dredge Entered By: Levora Dredge on 10/15/2022 15:25:05 -------------------------------------------------------------------------------- Lower Extremity Assessment Details Patient Name: Date of Service: [name] MSEY, DO RCA S 10/15/2022 2:15 PM Medical Record Number: [id number] Patient Account Number: [id number] Date of Birth/Sex: Treating RN: 08-Feb-1926 (89 y.o. [name] Primary Care Rudolf Blizard: Frazier Richards Other Clinician: Referring Justan Gaede: Treating Carmaleta Youngers/Extender: [name] in Treatment: 13 Vascular Assessment Pulses: Dorsalis Pedis Palpable: [Right:Yes] Electronic Signature(s) Signed: 10/15/2022 3:58:45 PM By: Levora Dredge Entered  By: Levora Dredge on 10/15/2022 14:35:43 -------------------------------------------------------------------------------- Multi Wound Chart Details Patient Name: Date of Service: [name] Royals, DO RCA S 10/15/2022 2:15 PM Medical Record Number: [id number] Patient Account Number: [id number] Date of Birth/Sex: Treating RN: 11-20-1925 (89 y.o. [name] Primary Care Sulay Brymer: Frazier Richards Other Clinician: Referring Rebekka Lobello: Treating Fidencia Mccloud/Extender: [name] in Treatment: 13 Vital Signs Height(in): Pulse(bpm): 73 Weight(lbs): 106 Blood Pressure(mmHg): 156/75 Body Mass Index(BMI): Temperature(F): 97.7 Respiratory Rate(breaths/min): 18 [name], [name] ([id number]) [id number] Page 4 of 8 [2R:Photos:] [N/A:N/A] Right, Anterior T Second oe N/A N/A Wound Location: Pressure Injury N/A N/A Wounding Event: Pressure Ulcer N/A N/A Primary Etiology: Hypertension, Dementia, Received N/A N/A Comorbid History: Chemotherapy 09/14/2022 N/A N/A Date Acquired: 7 N/A N/A Weeks of Treatment: Open N/A N/A Wound Status: Yes N/A N/A Wound Recurrence: 0.6x0.6x0.1 N/A N/A Measurements L x W x D (cm) 0.283 N/A N/A A (cm) : rea 0.028 N/A N/A Volume (cm) : 0.00% N/A N/A % Reduction in A rea: 0.00% N/A N/A % Reduction in Volume: Category/Stage II N/A N/A Classification: Medium N/A N/A Exudate A mount: Serous N/A N/A Exudate Type: amber N/A N/A Exudate Color: Small (1-33%) N/A N/A Granulation A mount: Red, Pink N/A N/A Granulation Quality: Large (67-100%) N/A N/A Necrotic A mount: Fat Layer (Subcutaneous Tissue): Yes N/A N/A Exposed Structures: Fascia: No Tendon: No Muscle: No Joint: No Bone: No None N/A N/A Epithelialization: Treatment Notes Electronic Signature(s) Signed: 10/15/2022 3:58:45 PM By: Levora Dredge Entered By: Levora Dredge on 10/15/2022  15:06:21 -------------------------------------------------------------------------------- Multi-Disciplinary Care Plan Details Patient Name: Date of Service: [name] Royals, DO RCA S 10/15/2022 2:15 PM Medical Record Number: [id number] Patient Account Number: [id number] Date of Birth/Sex: Treating RN: 02/27/1926 (89 y.o. [name] Primary Care Lamaya Hyneman: Frazier Richards Other Clinician: Referring Tildon Silveria: Treating [place] Paiz/Extender: [name] in Treatment: 13 Active Inactive Pain, Acute or Chronic Nursing Diagnoses: [name], [name] ([id number]) [id number] Page 5 of 8 Pain Management - Non-cyclic Acute (Procedural) Potential alteration in comfort, pain Goals: Patient will verbalize adequate pain control and receive pain control interventions during procedures as needed Date Initiated: 07/16/2022 Target Resolution Date: 08/13/2022 Goal Status: Active Patient/caregiver will verbalize adequate pain control between visits Date Initiated: 07/16/2022 Target Resolution Date: 08/13/2022 Goal Status: Active Patient/caregiver will verbalize comfort level met Date  Initiated: 07/16/2022 Target Resolution Date: 08/13/2022 Goal Status: Active Interventions: Reposition patient for comfort Treatment Activities: Administer pain control measures as ordered : 07/16/2022 Notes: Wound/Skin Impairment Nursing Diagnoses: Impaired tissue integrity Knowledge deficit related to smoking impact on wound healing Knowledge deficit related to ulceration/compromised skin integrity Goals: Patient/caregiver will verbalize understanding of skin care regimen Date Initiated: 07/16/2022 Date Inactivated: 10/15/2022 Target Resolution Date: 08/13/2022 Goal Status: Unmet Unmet Reason: pt baseline confussed Ulcer/skin breakdown will have a volume reduction of 30% by week 4 Date Initiated: 07/16/2022 Date Inactivated: 08/13/2022 Target Resolution Date:  08/13/2022 Goal Status: Met Ulcer/skin breakdown will have a volume reduction of 50% by week 8 Date Initiated: 08/13/2022 Target Resolution Date: 09/10/2022 Goal Status: Active Interventions: Assess patient/caregiver ability to obtain necessary supplies Assess patient/caregiver ability to perform ulcer/skin care regimen upon admission and as needed Assess ulceration(s) every visit Provide education on ulcer and skin care Treatment Activities: Skin care regimen initiated : 07/16/2022 Topical wound management initiated : 07/16/2022 Notes: Electronic Signature(s) Signed: 10/15/2022 3:58:45 PM By: Levora Dredge Entered By: Levora Dredge on 10/15/2022 15:23:57 -------------------------------------------------------------------------------- Pain Assessment Details Patient Name: Date of Service: [name] Bufford Spikes, DO RCA S 10/15/2022 2:15 PM Medical Record Number: [id number] Patient Account Number: [id number] Date of Birth/Sex: Treating RN: [birth year] (89 y.o. [name] Primary Care Vera Wishart: Frazier Richards Other Clinician: DELYNN, [name] ([id number]) [id number] Page 6 of 8 Referring Dianah Pruett: Treating Caylee Vlachos/Extender: [name] in Treatment: 13 Active Problems Location of Pain Severity and Description of Pain Patient Has Paino No Site Locations Rate the pain. Current Pain Level: 0 Pain Management and Medication Current Pain Management: Electronic Signature(s) Signed: 10/15/2022 3:58:45 PM By: Levora Dredge Entered By: Levora Dredge on 10/15/2022 14:26:57 -------------------------------------------------------------------------------- Patient/Caregiver Education Details Patient Name: Date of Service: [name] Royals, DO RCA S 3/21/2024andnbsp2:15 PM Medical Record Number: [id number] Patient Account Number: [id number] Date of Birth/Gender: Treating RN: 12-01-1925 (89 y.o. [name] Primary Care Physician: Frazier Richards Other Clinician: Referring Physician: Treating Physician/Extender: [name] in Treatment: 13 Education Assessment Education Provided To: Patient and Caregiver Education Topics Provided Wound Debridement: Handouts: Wound Debridement Methods: Explain/Verbal Responses: State content correctly Wound/Skin Impairment: Handouts: Caring for Your Ulcer Methods: Explain/Verbal Responses: State content correctly [place], [name] ([id number]) [id number] Page 7 of 8 Electronic Signature(s) Signed: 10/15/2022 3:58:45 PM By: Levora Dredge Entered By: Levora Dredge on 10/15/2022 15:24:15 -------------------------------------------------------------------------------- Wound Assessment Details Patient Name: Date of Service: [name] MSEY, DO RCA S 10/15/2022 2:15 PM Medical Record Number: [id number] Patient Account Number: [id number] Date of Birth/Sex: Treating RN: [birth year] (89 y.o. [name] Primary Care Cherylene Ferrufino: Frazier Richards Other Clinician: Referring Sharita Bienaime: Treating Joelle Flessner/Extender: [name] in Treatment: 13 Wound Status Wound Number: 2R Primary Etiology: Pressure Ulcer Wound Location: Right, Anterior T Second oe Wound Status: Open Wounding Event: Pressure Injury Comorbid History: Hypertension, Dementia, Received Chemotherapy Date Acquired: 09/14/2022 Weeks Of Treatment: 7 Clustered Wound: No Photos Wound Measurements Length: (cm) 0.6 Width: (cm) 0.6 Depth: (cm) 0.1 Area: (cm) 0.283 Volume: (cm) 0.028 % Reduction in Area: 0% % Reduction in Volume: 0% Epithelialization: None Tunneling: No Undermining: No Wound Description Classification: Category/Stage II Exudate Amount: Medium Exudate Type: Serous Exudate Color: amber Foul Odor After Cleansing: No Slough/Fibrino Yes Wound Bed Granulation Amount: Small (1-33%) Exposed Structure Granulation Quality: Red,  Pink Fascia Exposed: No Necrotic Amount: Large (67-100%) Fat Layer (Subcutaneous Tissue) Exposed: Yes Necrotic Quality: Adherent Slough Tendon Exposed: No Muscle Exposed: No Joint Exposed: No Bone Exposed: No  [name], [name] ([id number]) [id number] Page 8 of 8 Treatment Notes Wound #2R (Toe Second) Wound Laterality: Right, Anterior Cleanser Soap and Water Discharge Instruction: Gently cleanse wound with antibacterial soap, rinse and pat dry prior to dressing wounds Peri-Wound Care Topical Primary Dressing Prisma 4.34 (in) Discharge Instruction: Moisten w/normal saline or sterile water; Cover wound as directed. Do not remove from wound bed. Xeroform-HBD 2x2 (in/in) Discharge Instruction: Apply Xeroform-HBD 2x2 (in/in) as directed Secondary Dressing Conforming Butler Roll-Small Discharge Instruction: Apply Conforming Stretch Guaze Bandage as directed Tubular Elastic Net Bandage, Size1, 30 (yd) Secured With Compression Wrap Compression Stockings Environmental education officer) Signed: 10/15/2022 3:58:45 PM By: Levora Dredge Entered By: Levora Dredge on 10/15/2022 14:35:23 -------------------------------------------------------------------------------- Vitals Details Patient Name: Date of Service: [name] Bufford Spikes, DO RCA S 10/15/2022 2:15 PM Medical Record Number: [id number] Patient Account Number: [id number] Date of Birth/Sex: Treating RN: Nov 18, 1925 (89 y.o. [name] Primary Care Sonna Lipsky: Frazier Richards Other Clinician: Referring Sadao Weyer: Treating [place] Gentle/Extender: [name] in Treatment: 13 Vital Signs Time Taken: 14:24 Temperature (F): 97.7 Weight (lbs): 106 Pulse (bpm): 73 Respiratory Rate (breaths/min): 18 Blood Pressure (mmHg): 156/75 Reference Range: 80 - 120 mg / dl Electronic Signature(s) Signed: 10/15/2022 3:58:45 PM By: Levora Dredge Entered By: Levora Dredge on 10/15/2022 14:26:51

## 2022-10-22 ENCOUNTER — Encounter: Payer: Medicare Other | Admitting: Physician Assistant

## 2022-10-22 DIAGNOSIS — I87332 Chronic venous hypertension (idiopathic) with ulcer and inflammation of left lower extremity: Secondary | ICD-10-CM | POA: Diagnosis not present

## 2022-10-22 NOTE — Progress Notes (Addendum)
BOOTS, EMMERICH ([id number]) [id number] Page 1 of 8 Visit Report for 10/22/2022 Arrival Information Details Patient Name: Date of Service: [name] Royals, DO RCA S 10/22/2022 2:00 PM Medical Record Number: [id number] Patient Account Number: [id number] Date of Birth/Sex: Treating RN: [birth year] (89 y.o. Valetta Close Primary Care Valynn Schamberger: Frazier Richards Other Clinician: Referring Kristalyn Bergstresser: Treating Azreal Stthomas/Extender: [name] in Treatment: 14 Visit Information History Since Last Visit Added or deleted any medications: No Patient Arrived: Walker Any new allergies or adverse reactions: No Arrival Time: 14:17 Hospitalized since last visit: No Accompanied By: family Has Dressing in Place as Prescribed: Yes Transfer Assistance: EasyPivot Patient Lift Pain Present Now: No Patient Identification Verified: Yes Secondary Verification Process Completed: Yes Patient Requires Transmission-Based Precautions: No Patient Has Alerts: Yes Patient Alerts: Patient on Blood Thinner NOT DIABETIC Eliquis Electronic Signature(s) Signed: 10/22/2022 4:31:55 PM By: Levora Dredge Entered By: Levora Dredge on 10/22/2022 14:17:54 -------------------------------------------------------------------------------- Clinic Level of Care Assessment Details Patient Name: Date of Service: [name], DO RCA S 10/22/2022 2:00 PM Medical Record Number: [id number] Patient Account Number: [id number] Date of Birth/Sex: Treating RN: Aug 22, 1983 (89 y.o. Valetta Close Primary Care Schuyler Behan: Frazier Richards Other Clinician: Referring Dula Havlik: Treating Peggye Poon/Extender: [name] in Treatment: 14 Clinic Level of Care Assessment Items TOOL 4 Quantity Score []  - 0 Use when only an EandM is performed on FOLLOW-UP visit ASSESSMENTS - Nursing Assessment / Reassessment X- 1 10 Reassessment of Co-morbidities (includes updates in patient  status) X- 1 5 Reassessment of Adherence to Treatment Plan ASSESSMENTS - Wound and Skin A ssessment / Reassessment X - Simple Wound Assessment / Reassessment - one wound 1 5 []  - 0 Complex Wound Assessment / Reassessment - multiple wounds []  - 0 Dermatologic / Skin Assessment (not related to wound area) ASSESSMENTS - Focused Assessment []  - 0 Circumferential Edema Measurements - multi extremities []  - 0 Nutritional Assessment / Counseling / Intervention []  - 0 Lower Extremity Assessment (monofilament, tuning fork, pulses) []  - 0 Peripheral Arterial Disease Assessment (using hand held doppler) ASSESSMENTS - Ostomy and/or Continence Assessment and Care []  - 0 Incontinence Assessment and Management []  - 0 Ostomy Care Assessment and Management (repouching, etc.) PROCESS - Coordination of Care X - Simple Patient / Family Education for ongoing care 1 15 [place], Elysian ([id number]) [phone].pdf Page 2 of 8 []  - 0 Complex (extensive) Patient / Family Education for ongoing care X- 1 10 Staff obtains Consents, Records, T Results / Process Orders est []  - 0 Staff telephones HHA, Nursing Homes / Clarify orders / etc []  - 0 Routine Transfer to another Facility (non-emergent condition) []  - 0 Routine Hospital Admission (non-emergent condition) []  - 0 New Admissions / Biomedical engineer / Ordering NPWT Apligraf, etc. , []  - 0 Emergency Hospital Admission (emergent condition) X- 1 10 Simple Discharge Coordination []  - 0 Complex (extensive) Discharge Coordination PROCESS - Special Needs []  - 0 Pediatric / Minor Patient Management []  - 0 Isolation Patient Management []  - 0 Hearing / Language / Visual special needs []  - 0 Assessment of Community assistance (transportation, D/C planning, etc.) []  - 0 Additional assistance / Altered mentation []  - 0 Support Surface(s) Assessment (bed, cushion, seat, etc.) INTERVENTIONS - Wound Cleansing /  Measurement X - Simple Wound Cleansing - one wound 1 5 []  - 0 Complex Wound Cleansing - multiple wounds X- 1 5 Wound Imaging (photographs - any number of wounds) []  - 0 Wound Tracing (instead of photographs) X- 1 5 Simple  Wound Measurement - one wound []  - 0 Complex Wound Measurement - multiple wounds INTERVENTIONS - Wound Dressings X - Small Wound Dressing one or multiple wounds 1 10 []  - 0 Medium Wound Dressing one or multiple wounds []  - 0 Large Wound Dressing one or multiple wounds X- 1 5 Application of Medications - topical []  - 0 Application of Medications - injection INTERVENTIONS - Miscellaneous []  - 0 External ear exam []  - 0 Specimen Collection (cultures, biopsies, blood, body fluids, etc.) []  - 0 Specimen(s) / Culture(s) sent or taken to Lab for analysis []  - 0 Patient Transfer (multiple staff / Civil Service fast streamer / Similar devices) []  - 0 Simple Staple / Suture removal (25 or less) []  - 0 Complex Staple / Suture removal (26 or more) []  - 0 Hypo / Hyperglycemic Management (close monitor of Blood Glucose) []  - 0 Ankle / Brachial Index (ABI) - do not check if billed separately X- 1 5 Vital Signs Has the patient been seen at the hospital within the last three years: Yes Total Score: 90 Level Of Care: New/Established - Level 3 Electronic Signature(s) Signed: 10/22/2022 4:31:55 PM By: Levora Dredge Entered By: Levora Dredge on 10/22/2022 16:22:52 [name] ([id number]) [id number] Page 3 of 8 -------------------------------------------------------------------------------- Encounter Discharge Information Details Patient Name: Date of Service: [name] Royals, DO RCA S 10/22/2022 2:00 PM Medical Record Number: [id number] Patient Account Number: [id number] Date of Birth/Sex: Treating RN: [birth year] (89 y.o. Valetta Close Primary Care Llewyn Heap: Frazier Richards Other Clinician: Referring Ocean Schildt: Treating Sundance Moise/Extender: [name] in Treatment: 14 Encounter Discharge Information Items Discharge Condition: Stable Ambulatory Status: Walker Discharge Destination: [place] Telephoned: No Orders Sent: Yes Transportation: Private Auto Accompanied By: son Schedule Follow-up Appointment: Yes Clinical Summary of Care: Notes orders sent with pt for nursing home Electronic Signature(s) Signed: 10/22/2022 4:25:23 PM By: Levora Dredge Entered By: Levora Dredge on 10/22/2022 16:25:23 -------------------------------------------------------------------------------- Lower Extremity Assessment Details Patient Name: Date of Service: [name], DO RCA S 10/22/2022 2:00 PM Medical Record Number: [id number] Patient Account Number: [id number] Date of Birth/Sex: Treating RN: [birth year] (89 y.o. Valetta Close Primary Care Fitz Matsuo: Frazier Richards Other Clinician: Referring Irving Bloor: Treating Caylin Raby/Extender: Elvina Sidle Weeks in Treatment: 14 Edema Assessment Assessed: [Left: No] [Right: No] Edema: [Left: Ye] [Right: s] Vascular Assessment Pulses: Dorsalis Pedis Palpable: [Right:Yes] Posterior Tibial Palpable: [Right:Yes] Notes pt wears own compression stockings bilaterally Electronic Signature(s) Signed: 10/22/2022 4:31:55 PM By: Levora Dredge Entered By: Levora Dredge on 10/22/2022 14:24:45 -------------------------------------------------------------------------------- Multi Wound Chart Details Patient Name: Date of Service: [name] Royals, DO RCA S 10/22/2022 2:00 PM Medical Record Number: [id number] Patient Account Number: [id number] Date of Birth/Sex: Treating RN: [birth year] (89 y.o. Valetta Close Primary Care Donnell Wion: Frazier Richards Other Clinician: Referring Kalysta Kneisley: Treating Ned Kakar/Extender: [name] in Treatment: [address] [place] ([id number]) [id number] Page 4 of  8 Vital Signs Height(in): Pulse(bpm): 74 Weight(lbs): 106 Blood Pressure(mmHg): 144/80 Body Mass Index(BMI): Temperature(F): 97.7 Respiratory Rate(breaths/min): 18 [2R:Photos:] [N/A:N/A] Right, Anterior T Second oe N/A N/A Wound Location: Pressure Injury N/A N/A Wounding Event: Pressure Ulcer N/A N/A Primary Etiology: Hypertension, Dementia, Received N/A N/A Comorbid History: Chemotherapy 09/14/2022 N/A N/A Date Acquired: 8 N/A N/A Weeks of Treatment: Open N/A N/A Wound Status: Yes N/A N/A Wound Recurrence: 0.3x0.3x0.1 N/A N/A Measurements L x W x D (cm) 0.071 N/A N/A A (cm) : rea 0.007 N/A N/A Volume (cm) : 74.90% N/A N/A % Reduction in A rea: 75.00% N/A N/A %  Reduction in Volume: Category/Stage II N/A N/A Classification: Medium N/A N/A Exudate A mount: Serosanguineous N/A N/A Exudate Type: red, brown N/A N/A Exudate Color: Large (67-100%) N/A N/A Granulation A mount: Red, Pink N/A N/A Granulation Quality: Small (1-33%) N/A N/A Necrotic A mount: Fat Layer (Subcutaneous Tissue): Yes N/A N/A Exposed Structures: Fascia: No Tendon: No Muscle: No Joint: No Bone: No Small (1-33%) N/A N/A Epithelialization: Treatment Notes Electronic Signature(s) Signed: 10/22/2022 4:31:55 PM By: Levora Dredge Entered By: Levora Dredge on 10/22/2022 14:31:52 -------------------------------------------------------------------------------- Multi-Disciplinary Care Plan Details Patient Name: Date of Service: [name] Royals, DO RCA S 10/22/2022 2:00 PM Medical Record Number: [id number] Patient Account Number: [id number] Date of Birth/Sex: Treating RN: 1925/08/24 (89 y.o. Valetta Close Primary Care Donyea Gafford: Frazier Richards Other Clinician: Referring Lex Linhares: Treating Caedan Sumler/Extender: [name] in Treatment: 14 Active Inactive Pain, Acute or Chronic Nursing Diagnoses: Pain Management - Non-cyclic Acute (Procedural) Potential  alteration in comfort, pain YULINDA, SCOVEL ([id number]) [id number] Page 5 of 8 Goals: Patient will verbalize adequate pain control and receive pain control interventions during procedures as needed Date Initiated: 07/16/2022 Date Inactivated: 10/22/2022 Target Resolution Date: 08/13/2022 Goal Status: Met Patient/caregiver will verbalize adequate pain control between visits Date Initiated: 07/16/2022 Date Inactivated: 10/22/2022 Target Resolution Date: 08/13/2022 Goal Status: Met Patient/caregiver will verbalize comfort level met Date Initiated: 07/16/2022 Target Resolution Date: 08/13/2022 Goal Status: Active Interventions: Reposition patient for comfort Treatment Activities: Administer pain control measures as ordered : 07/16/2022 Notes: pt wound improving, decrease in pain Wound/Skin Impairment Nursing Diagnoses: Impaired tissue integrity Knowledge deficit related to smoking impact on wound healing Knowledge deficit related to ulceration/compromised skin integrity Goals: Patient/caregiver will verbalize understanding of skin care regimen Date Initiated: 07/16/2022 Date Inactivated: 10/15/2022 Target Resolution Date: 08/13/2022 Goal Status: Unmet Unmet Reason: pt baseline confussed Ulcer/skin breakdown will have a volume reduction of 30% by week 4 Date Initiated: 07/16/2022 Date Inactivated: 08/13/2022 Target Resolution Date: 08/13/2022 Goal Status: Met Ulcer/skin breakdown will have a volume reduction of 50% by week 8 Date Initiated: 08/13/2022 Target Resolution Date: 09/10/2022 Goal Status: Active Interventions: Assess patient/caregiver ability to obtain necessary supplies Assess patient/caregiver ability to perform ulcer/skin care regimen upon admission and as needed Assess ulceration(s) every visit Provide education on ulcer and skin care Treatment Activities: Skin care regimen initiated : 07/16/2022 Topical wound management initiated :  07/16/2022 Notes: Electronic Signature(s) Signed: 10/22/2022 4:24:02 PM By: Levora Dredge Entered By: Levora Dredge on 10/22/2022 16:24:02 -------------------------------------------------------------------------------- Pain Assessment Details Patient Name: Date of Service: [name], DO RCA S 10/22/2022 2:00 PM Medical Record Number: [id number] Patient Account Number: [id number] Date of Birth/Sex: Treating RN: 05-12-1926 (89 y.o. Valetta Close Primary Care Vaunda Gutterman: Frazier Richards Other Clinician: Referring Amita Atayde: Treating Kaina Orengo/Extender: [name] in Treatment: 14 Active Problems Location of Pain Severity and Description of Pain Patient Has Paino No Site Locations Rate the pain. TAHNEE, BUCKWALTER ([id number]) [id number] Page 6 of 8 Rate the pain. Current Pain Level: 0 Pain Management and Medication Current Pain Management: Electronic Signature(s) Signed: 10/22/2022 4:31:55 PM By: Levora Dredge Entered By: Levora Dredge on 10/22/2022 14:18:16 -------------------------------------------------------------------------------- Patient/Caregiver Education Details Patient Name: Date of Service: [name] Royals, DO RCA S 3/28/2024andnbsp2:00 PM Medical Record Number: [id number] Patient Account Number: [id number] Date of Birth/Gender: Treating RN: [birth year] (89 y.o. Valetta Close Primary Care Physician: Frazier Richards Other Clinician: Referring Physician: Treating Physician/Extender: [name] in Treatment: 14 Education Assessment Education Provided To: Patient and Caregiver Education Topics Provided Infection: Handouts: Hygiene  and Infection Prevention, Other: importance of antibiotic Methods: Explain/Verbal Responses: State content correctly Wound/Skin Impairment: Handouts: Caring for Your Ulcer Methods: Explain/Verbal Responses: State content correctly Electronic  Signature(s) Signed: 10/22/2022 4:31:55 PM By: Levora Dredge Entered By: Levora Dredge on 10/22/2022 16:24:28 -------------------------------------------------------------------------------- Wound Assessment Details Patient Name: Date of Service: [name], DO RCA S 10/22/2022 2:00 PM Medical Record Number: [id number] Patient Account Number: [id number] Date of Birth/Sex: Treating RN: 1926-07-27 (89 y.o. Valetta Close Primary Care Adolfo Granieri: Frazier Richards Other Clinician: Referring Shanera Meske: Treating Minka Knight/Extender: [name] in Treatment: [address] [place], [name] ([id number]) [id number] Page 7 of 8 Wound Number: 2R Primary Etiology: Pressure Ulcer Wound Location: Right, Anterior T Second oe Wound Status: Open Wounding Event: Pressure Injury Comorbid History: Hypertension, Dementia, Received Chemotherapy Date Acquired: 09/14/2022 Weeks Of Treatment: 8 Clustered Wound: No Photos Wound Measurements Length: (cm) 0.3 Width: (cm) 0.3 Depth: (cm) 0.1 Area: (cm) 0.071 Volume: (cm) 0.007 % Reduction in Area: 74.9% % Reduction in Volume: 75% Epithelialization: Small (1-33%) Tunneling: No Undermining: No Wound Description Classification: Category/Stage II Exudate Amount: Medium Exudate Type: Serosanguineous Exudate Color: red, brown Foul Odor After Cleansing: No Slough/Fibrino No Wound Bed Granulation Amount: Large (67-100%) Exposed Structure Granulation Quality: Red, Pink Fascia Exposed: No Necrotic Amount: Small (1-33%) Fat Layer (Subcutaneous Tissue) Exposed: Yes Necrotic Quality: Adherent Slough Tendon Exposed: No Muscle Exposed: No Joint Exposed: No Bone Exposed: No Treatment Notes Wound #2R (Toe Second) Wound Laterality: Right, Anterior Cleanser Soap and Water Discharge Instruction: Gently cleanse wound with antibacterial soap, rinse and pat dry prior to dressing wounds Peri-Wound  Care Topical Primary Dressing Prisma 4.34 (in) Discharge Instruction: Moisten w/normal saline or sterile water; Cover wound as directed. Do not remove from wound bed. Xeroform-HBD 2x2 (in/in) Discharge Instruction: Apply Xeroform-HBD 2x2 (in/in) as directed Secondary Dressing Conforming [place] Roll-Small Discharge Instruction: Apply Conforming Stretch Guaze Bandage as directed Tubular Elastic Net Bandage, Size1, 30 (yd) Secured With [employer] Compression Stockings Add-Ons Electronic Signature(s) [place], [name] ([id number]) [id number] Page 8 of 8 Signed: 10/22/2022 4:31:55 PM By: Levora Dredge Entered By: Levora Dredge on 10/22/2022 14:24:11 -------------------------------------------------------------------------------- Vitals Details Patient Name: Date of Service: [name], DO RCA S 10/22/2022 2:00 PM Medical Record Number: [id number] Patient Account Number: [id number] Date of Birth/Sex: Treating RN: 1925/12/24 (89 y.o. Valetta Close Primary Care Matha Masse: Frazier Richards Other Clinician: Referring Ludie Hudon: Treating Betsey Sossamon/Extender: [name] in Treatment: 14 Vital Signs Time Taken: 14:15 Temperature (F): 97.7 Weight (lbs): 106 Pulse (bpm): 74 Respiratory Rate (breaths/min): 18 Blood Pressure (mmHg): 144/80 Reference Range: 80 - 120 mg / dl Electronic Signature(s) Signed: 10/22/2022 4:31:55 PM By: Levora Dredge Entered By: Levora Dredge on 10/22/2022 14:18:11

## 2022-10-22 NOTE — Progress Notes (Addendum)
RENAYE, [name] ([id number]) [id number] Page 1 of 6 Visit Report for 10/22/2022 Chief Complaint Document Details Patient Name: Date of Service: [name] Royals, DO RCA S 10/22/2022 2:00 PM Medical Record Number: [id number] Patient Account Number: [id number] Date of Birth/Sex: Treating RN: 07-31-1925 (89 y.o. [name] Primary Care Provider: Frazier [name] Other Clinician: Referring Provider: Treating Provider/Extender: [name] in Treatment: 14 Information Obtained from: Patient Chief Complaint Right 2nd toe pressure ulcer Electronic Signature(s) Signed: 10/22/2022 2:16:23 PM By: Worthy Keeler PA-C Previous Signature: 10/22/2022 2:15:09 PM Version By: Worthy Keeler PA-C Entered By: Worthy Keeler on 10/22/2022 14:16:23 -------------------------------------------------------------------------------- HPI Details Patient Name: Date of Service: [name] MSEY, DO RCA S 10/22/2022 2:00 PM Medical Record Number: [id number] Patient Account Number: [id number] Date of Birth/Sex: Treating RN: 1926-05-30 (89 y.o. [name] Primary Care Provider: Frazier [name] Other Clinician: Referring Provider: Treating Provider/Extender: [name] in Treatment: 14 History of Present Illness HPI Description: 07-16-2022 upon evaluation today patient presents for initial inspection here in our clinic concerning a wound on the left lateral lower extremity. This is an area that has been present since around May 27, 2022 according to what the patient tells me today. With that being said she does live in assisted living facility. She is seen with her son in the office today. As far as past medical history is concerned she does have a history of breast cancer in 2003 which she did obviously survive. She also had a right knee replacement. In 2009 she had an IVC filter and heart ablation due to atrial fibrillation. With  regard to the remainder of her medical history again she is on anticoagulant therapy at this point due to atrial fibrillation, she has hypertension, and chronic venous insufficiency based on what I am seeing currently. In regard to the wound she is currently on Keflex which should be completed on the 25th of this month. Other than that she has had various things applied to the wound including different creams but then at other times she also states that she has had nothing put on and been told to just leave it open to air. There is been a lot of confusion about the best treatment course with regard to her wound which is obviously not good. I discussed with her today that I think we can have the best plan going forward and that that will include keeping this covered but nonetheless it should help it to heal the most effectively as well. 12/29; left lower leg chronic venous insufficiency. She complains fairly bitterly about the tightness of the wrap. She was put on antibiotics last week which I believe is Keflex. She lives in an assisted living 07-31-2021 upon evaluation today patient appears to be doing well currently in regard to her wound. She is actually been tolerating the dressing changes this is measuring smaller and looking better. I think we are on the right track I do believe that the current compression wrap is helping significantly with her Ealing at this point. Overall I do not see any signs of infection locally nor systemically which is great news. No fevers, chills, nausea, vomiting, or diarrhea. 08-13-2022 upon evaluation today patient's wound on her leg actually showing signs of erythema around the edges of the wound that she is also still having quite a bit of drainage all things considered. Fortunately I do not see any evidence of infection systemically though locally I definitely see some evidence here both with  erythema, warmth, and drainage. She has been using the silver alginate  dressing we have also had an 3 layer compression wrap though she did not feel good felt very tight at the top of the wrap which is the main concern she has today. 1/25; the patient's wound is smaller today. She is on doxycycline. Culture result from last week showed MRSA but fortunately sensitive to doxycycline which she is taking. She is using TCA Sorbact under 3 layer compression. 08-27-2022 upon evaluation today patient appears to be doing well currently in regard to her left leg ulcer which does not appear to be doing poorly at all in fact this appears to be healing quite nicely. I am extremely pleased with where we stand I do not see any signs of active infection locally nor systemically which is great news. No fevers, chills, nausea, vomiting, or diarrhea. 09-03-2022 upon evaluation patient's wound is actually showing signs of significant improvement I am actually very pleased with where we stand currently. I do not see any signs of active knee flexion at this time which is great news. No fevers, chills, nausea, vomiting, or diarrhea. 09-10-2022 upon evaluation today patient appears to be doing well currently in regard to her wounds. Both the toe as well as the leg appear to be showing signs of good improvement. Fortunately I do not see any evidence of active infection at this time which is great news. She does have her compression socks. 09-24-2022 upon evaluation today patient's wound actually is showing signs of doing much worse than last time I saw her in fact it was pretty much healed we were just seeing her today to ensure that it was still continue to be. Unfortunately not only is open that actually seems to be open down to the joint. I am somewhat concerned about how quickly this went downhill I am not sure if this is an indication of a severe infection that was hide he is in not apparent last time although this is the worst the wound has been it was never even [name] to this bad when I was  seeing her in the past several weeks. [name], [name] ([id number]) [id number] Page 2 of 6 3/6This is a patient who came in last week with a opening over the proximal interphalangeal joint of the right second toe. Very painful. Her original wound was on the left side. Culture done this week showed pansensitive Proteus the patient is on doxycycline. An x-ray came from the facility there was no fracture small amount of osteoarthritis but no obvious bony abnormalities. 10-15-2022 upon evaluation today patient's toe ulcer actually showed signs of improvement compared to last time I saw her. With that being said this is definitely not looking as good as what we previously saw when she was healed but is looking better than last time I saw her right before going out of town. I am going to perform some sharp debridement today however to clear away some of the necrotic debris and I discussed that with the patient and her son. 10-22-2022 upon evaluation today patient appears to be doing much better in regard to her wound the toe is actually showing signs of excellent improvement. I do believe that the [place] [employer] has been a very good for her as far as getting this infection moving in the right direction. I do not see any signs of active infection systemically nor locally at this point. No fevers, chills, nausea, vomiting, or diarrhea. Electronic Signature(s) Signed: 10/22/2022 2:46:06 PM By:  Melburn Hake, Yena Tisby PA-C Entered By: Worthy Keeler on 10/22/2022 14:46:06 -------------------------------------------------------------------------------- Physical Exam Details Patient Name: Date of Service: [name] Royals, DO RCA S 10/22/2022 2:00 PM Medical Record Number: [id number] Patient Account Number: [id number] Date of Birth/Sex: Treating RN: April 19, 1926 (89 y.o. [name] Primary Care Provider: Frazier [name] Other Clinician: Referring Provider: Treating Provider/Extender: [name] in Treatment: 63 Constitutional Well-nourished and well-hydrated in no acute distress. Respiratory normal breathing without difficulty. Psychiatric this patient is able to make decisions and demonstrates good insight into disease process. Alert and Oriented x 3. pleasant and cooperative. Notes Patient's wound bed actually showed signs of good granulation epithelization at this point. Fortunately I do not see any evidence of infection worsening and I think the toe is actually doing much better with the [place]. I discussed with the patient that I would like for her to continue as such with that currently. She is not having some diarrhea but I think this is still leftover from the Augmentin to be honest. She does tell me that she had some blood when she went to the bathroom I would like for them to check a Hemoccult test over this on the orders to go back. Electronic Signature(s) Signed: 10/22/2022 2:46:48 PM By: Worthy Keeler PA-C Entered By: Worthy Keeler on 10/22/2022 14:46:48 -------------------------------------------------------------------------------- Physician Orders Details Patient Name: Date of Service: [name] MSEY, DO RCA S 10/22/2022 2:00 PM Medical Record Number: [id number] Patient Account Number: [id number] Date of Birth/Sex: Treating RN: 1926-04-15 (89 y.o. [name] Primary Care Provider: Frazier [name] Other Clinician: Referring Provider: Treating Provider/Extender: [name] in Treatment: [phone] Verbal / Phone Orders: No Diagnosis Coding ICD-10 Coding Code Description [phone] Chronic venous hypertension (idiopathic) with ulcer and inflammation of left lower extremity L98.492 Non-pressure chronic ulcer of skin of other sites with fat layer exposed I10 Essential (primary) hypertension I48.0 Paroxysmal atrial fibrillation Z79.01 Long term (current) use of anticoagulants Bathing/ Shower/ Hygiene Wound  #2R Right,Anterior T Second oe May shower; gently cleanse wound with antibacterial soap, rinse and pat dry prior to dressing wounds [name], [name] ([id number]) [id number] Page 3 of 6 Edema Control - Lymphedema / Segmental Compressive Device / Other Bilateral Lower Extremities Patient to wear own compression stockings. Remove compression stockings every night before going to bed and put on every morning when getting up. - Wear compression stockings continuously except when due to shower. May remove to shower but replace after. Elevate, Exercise Daily and A void Standing for Long Periods of Time. Elevate legs to the level of the heart and pump ankles as often as possible Elevate leg(s) parallel to the floor when sitting. DO YOUR BEST to sleep in the bed at night. DO NOT sleep in your recliner. Long hours of sitting in a recliner leads to swelling of the legs and/or potential wounds on your backside. Additional Orders / Instructions Follow Nutritious Diet and Increase Protein Intake Wound Treatment Wound #2R - T Second oe Wound Laterality: Right, Anterior Cleanser: Soap and Water 1 x Per Day/30 Days Discharge Instructions: Gently cleanse wound with antibacterial soap, rinse and pat dry prior to dressing wounds Prim Dressing: Prisma 4.34 (in) 1 x Per Day/30 Days ary Discharge Instructions: Moisten w/normal saline or sterile water; Cover wound as directed. Do not remove from wound bed. Prim Dressing: Xeroform-HBD 2x2 (in/in) 1 x Per Day/30 Days ary Discharge Instructions: Apply Xeroform-HBD 2x2 (in/in) as directed Secondary Dressing: Conforming Guaze Roll-Small 1 x Per Day/30 Days  Discharge Instructions: Apply Conforming Stretch Guaze Bandage as directed Secondary Dressing: Tubular Elastic Net Bandage, Size1, 30 (yd) 1 x Per Day/30 Days Electronic Signature(s) Signed: 10/22/2022 4:02:24 PM By: Worthy Keeler PA-C Signed: 10/22/2022 4:31:55 PM By: Levora Dredge Entered By: Levora Dredge on 10/22/2022 14:45:53 -------------------------------------------------------------------------------- Problem List Details Patient Name: Date of Service: [name] Bufford Spikes, DO RCA S 10/22/2022 2:00 PM Medical Record Number: [id number] Patient Account Number: [id number] Date of Birth/Sex: Treating RN: 08/31/1925 (89 y.o. [name] Primary Care Provider: Frazier [name] Other Clinician: Referring Provider: Treating Provider/Extender: [name] in Treatment: 14 Active Problems ICD-10 Encounter Code Description Active Date MDM Diagnosis I87.332 Chronic venous hypertension (idiopathic) with ulcer and inflammation of left 07/16/2022 No Yes lower extremity L98.492 Non-pressure chronic ulcer of skin of other sites with fat layer exposed 10/22/2022 No Yes I10 Essential (primary) hypertension 07/16/2022 No Yes I48.0 Paroxysmal atrial fibrillation 07/16/2022 No Yes Z79.01 Long term (current) use of anticoagulants 07/16/2022 No Yes [name], [name] ([id number]) [id number] Page 4 of 6 Inactive Problems Resolved Problems ICD-10 Code Description Active Date Resolved Date L97.822 Non-pressure chronic ulcer of other part of left lower leg with fat layer exposed 07/16/2022 07/16/2022 Electronic Signature(s) Signed: 10/22/2022 2:16:10 PM By: Worthy Keeler PA-C Previous Signature: 10/22/2022 2:15:06 PM Version By: Worthy Keeler PA-C Entered By: Worthy Keeler on 10/22/2022 14:16:10 -------------------------------------------------------------------------------- Progress Note Details Patient Name: Date of Service: [name] MSEY, DO RCA S 10/22/2022 2:00 PM Medical Record Number: [id number] Patient Account Number: [id number] Date of Birth/Sex: Treating RN: 08/08/1925 (89 y.o. [name] Primary Care Provider: Frazier [name] Other Clinician: Referring Provider: Treating Provider/Extender: [name] in Treatment: 14 Subjective Chief Complaint Information obtained from Patient Right 2nd toe pressure ulcer History of Present Illness (HPI) 07-16-2022 upon evaluation today patient presents for initial inspection here in our clinic concerning a wound on the left lateral lower extremity. This is an area that has been present since around May 27, 2022 according to what the patient tells me today. With that being said she does live in assisted living facility. She is seen with her son in the office today. As far as past medical history is concerned she does have a history of breast cancer in 2003 which she did obviously survive. She also had a right knee replacement. In 2009 she had an IVC filter and heart ablation due to atrial fibrillation. With regard to the remainder of her medical history again she is on anticoagulant therapy at this point due to atrial fibrillation, she has hypertension, and chronic venous insufficiency based on what I am seeing currently. In regard to the wound she is currently on Keflex which should be completed on the 25th of this month. Other than that she has had various things applied to the wound including different creams but then at other times she also states that she has had nothing put on and been told to just leave it open to air. There is been a lot of confusion about the best treatment course with regard to her wound which is obviously not good. I discussed with her today that I think we can have the best plan going forward and that that will include keeping this covered but nonetheless it should help it to heal the most effectively as well. 12/29; left lower leg chronic venous insufficiency. She complains fairly bitterly about the tightness of the wrap. She was put on antibiotics last week which I believe is  Keflex. She lives in an assisted living 07-31-2021 upon evaluation today patient appears to be doing well currently in  regard to her wound. She is actually been tolerating the dressing changes this is measuring smaller and looking better. I think we are on the right track I do believe that the current compression wrap is helping significantly with her Ealing at this point. Overall I do not see any signs of infection locally nor systemically which is great news. No fevers, chills, nausea, vomiting, or diarrhea. 08-13-2022 upon evaluation today patient's wound on her leg actually showing signs of erythema around the edges of the wound that she is also still having quite a bit of drainage all things considered. Fortunately I do not see any evidence of infection systemically though locally I definitely see some evidence here both with erythema, warmth, and drainage. She has been using the silver alginate dressing we have also had an 3 layer compression wrap though she did not feel good felt very tight at the top of the wrap which is the main concern she has today. 1/25; the patient's wound is smaller today. She is on doxycycline. Culture result from last week showed MRSA but fortunately sensitive to doxycycline which she is taking. She is using TCA Sorbact under 3 layer compression. 08-27-2022 upon evaluation today patient appears to be doing well currently in regard to her left leg ulcer which does not appear to be doing poorly at all in fact this appears to be healing quite nicely. I am extremely pleased with where we stand I do not see any signs of active infection locally nor systemically which is great news. No fevers, chills, nausea, vomiting, or diarrhea. 09-03-2022 upon evaluation patient's wound is actually showing signs of significant improvement I am actually very pleased with where we stand currently. I do not see any signs of active knee flexion at this time which is great news. No fevers, chills, nausea, vomiting, or diarrhea. 09-10-2022 upon evaluation today patient appears to be doing well currently in regard to  her wounds. Both the toe as well as the leg appear to be showing signs of good improvement. Fortunately I do not see any evidence of active infection at this time which is great news. She does have her compression socks. 09-24-2022 upon evaluation today patient's wound actually is showing signs of doing much worse than last time I saw her in fact it was pretty much healed we were just seeing her today to ensure that it was still continue to be. Unfortunately not only is open that actually seems to be open down to the joint. I am somewhat concerned about how quickly this went downhill I am not sure if this is an indication of a severe infection that was hide he is in not apparent last time although this is the worst the wound has been it was never even [name] to this bad when I was seeing her in the past several weeks. 3/6This is a patient who came in last week with a opening over the proximal interphalangeal joint of the right second toe. Very painful. Her original wound was on the left side. Culture done this week showed pansensitive Proteus the patient is on doxycycline. An x-ray came from the facility there was no fracture small amount of osteoarthritis but no obvious bony abnormalities. 10-15-2022 upon evaluation today patient's toe ulcer actually showed signs of improvement compared to last time I saw her. With that being said this is definitely not looking as  good as what we previously saw when she was healed but is looking better than last time I saw her right before going out of town. I am [name], [name] ([id number]) [id number] Page 5 of 6 going to perform some sharp debridement today however to clear away some of the necrotic debris and I discussed that with the patient and her son. 10-22-2022 upon evaluation today patient appears to be doing much better in regard to her wound the toe is actually showing signs of excellent improvement. I do believe that the [employer] has  been a very good for her as far as getting this infection moving in the right direction. I do not see any signs of active infection systemically nor locally at this point. No fevers, chills, nausea, vomiting, or diarrhea. Objective Constitutional Well-nourished and well-hydrated in no acute distress. Vitals Time Taken: 2:15 PM, Weight: 106 lbs, Temperature: 97.7 F, Pulse: 74 bpm, Respiratory Rate: 18 breaths/min, Blood Pressure: 144/80 mmHg. Respiratory normal breathing without difficulty. Psychiatric this patient is able to make decisions and demonstrates good insight into disease process. Alert and Oriented x 3. pleasant and cooperative. General Notes: Patient's wound bed actually showed signs of good granulation epithelization at this point. Fortunately I do not see any evidence of infection worsening and I think the toe is actually doing much better with the [place]. I discussed with the patient that I would like for her to continue as such with that currently. She is not having some diarrhea but I think this is still leftover from the Augmentin to be honest. She does tell me that she had some blood when she went to the bathroom I would like for them to check a Hemoccult test over this on the orders to go back. Integumentary (Hair, Skin) Wound #2R status is Open. Original cause of wound was Pressure Injury. The date acquired was: 09/14/2022. The wound has been in treatment 8 weeks. The wound is located on the Right,Anterior T Second. The wound measures 0.3cm length x 0.3cm width x 0.1cm depth; 0.071cm^2 area and 0.007cm^3 volume. oe There is Fat Layer (Subcutaneous Tissue) exposed. There is no tunneling or undermining noted. There is a medium amount of serosanguineous drainage noted. There is large (67-100%) red, pink granulation within the wound bed. There is a small (1-33%) amount of necrotic tissue within the wound bed including Adherent Slough. Assessment Active  Problems ICD-10 Chronic venous hypertension (idiopathic) with ulcer and inflammation of left lower extremity Non-pressure chronic ulcer of skin of other sites with fat layer exposed Essential (primary) hypertension Paroxysmal atrial fibrillation Long term (current) use of anticoagulants Plan Bathing/ Shower/ Hygiene: Wound #2R Right,Anterior T Second: oe May shower; gently cleanse wound with antibacterial soap, rinse and pat dry prior to dressing wounds Edema Control - Lymphedema / Segmental Compressive Device / Other: Patient to wear own compression stockings. Remove compression stockings every night before going to bed and put on every morning when getting up. - Wear compression stockings continuously except when due to shower. May remove to shower but replace after. Elevate, Exercise Daily and Avoid Standing for Long Periods of Time. Elevate legs to the level of the heart and pump ankles as often as possible Elevate leg(s) parallel to the floor when sitting. DO YOUR BEST to sleep in the bed at night. DO NOT sleep in your recliner. Long hours of sitting in a recliner leads to swelling of the legs and/or potential wounds on your backside. Additional Orders / Instructions: Follow Nutritious Diet and Increase  Protein Intake WOUND #2R: - T Second Wound Laterality: Right, Anterior oe Cleanser: Soap and Water 1 x Per Day/30 Days Discharge Instructions: Gently cleanse wound with antibacterial soap, rinse and pat dry prior to dressing wounds Prim Dressing: Prisma 4.34 (in) 1 x Per Day/30 Days ary Discharge Instructions: Moisten w/normal saline or sterile water; Cover wound as directed. Do not remove from wound bed. Prim Dressing: Xeroform-HBD 2x2 (in/in) 1 x Per Day/30 Days ary Discharge Instructions: Apply Xeroform-HBD 2x2 (in/in) as directed Secondary Dressing: Conforming Guaze Roll-Small 1 x Per Day/30 Days Discharge Instructions: [place] as  directed Secondary Dressing: Tubular Elastic Net Bandage, [place], 30 (yd) 1 x Per Day/30 Days [name], [name] ([id number]) [id number] Page 6 of 6 1. I am going to have the facility check Hemoccult test in order to see if there is any signs of blood in her bowel movement as she has been seen. I am hopeful that this will be cleared. She also tells me she has been having diarrhea but at the same time I think this may be elective or from the Augmentin not from the [place]. 2. I am good recommend as well that the patient should continue to use the Prisma followed by the Xeroform which I think is actually doing a really good job here. We will see patient back for reevaluation in 1 week here in the clinic. If anything worsens or changes patient will contact our office for additional recommendations. Electronic Signature(s) Signed: 10/22/2022 2:47:31 PM By: Worthy Keeler PA-C Entered By: Worthy Keeler on 10/22/2022 14:47:30 -------------------------------------------------------------------------------- SuperBill Details Patient Name: Date of Service: [name] MSEY, DO RCA S 10/22/2022 Medical Record Number: [id number] Patient Account Number: [id number] Date of Birth/Sex: Treating RN: 08/24/1925 (89 y.o. [name] Primary Care Provider: Frazier [name] Other Clinician: Referring Provider: Treating Provider/Extender: [name] in Treatment: 14 Diagnosis Coding ICD-10 Codes Code Description [phone] Chronic venous hypertension (idiopathic) with ulcer and inflammation of left lower extremity L98.492 Non-pressure chronic ulcer of skin of other sites with fat layer exposed I10 Essential (primary) hypertension I48.0 Paroxysmal atrial fibrillation Z79.01 Long term (current) use of anticoagulants Facility Procedures : CPT4 Code: [id number] Description: 99213 - WOUND CARE VISIT-LEV 3 EST PT Modifier: Quantity: 1 Physician Procedures :  CPT4 Code Description Modifier [id number] 99213 - WC PHYS LEVEL 3 - EST PT ICD-10 Diagnosis Description I87.332 Chronic venous hypertension (idiopathic) with ulcer and inflammation of left lower extremity L98.492 Non-pressure chronic ulcer of skin of  other sites with fat layer exposed I10 Essential (primary) hypertension I48.0 Paroxysmal atrial fibrillation Quantity: 1 Electronic Signature(s) Signed: 10/22/2022 4:23:01 PM By: Levora Dredge Previous Signature: 10/22/2022 2:47:47 PM Version By: Worthy Keeler PA-C Entered By: Levora Dredge on 10/22/2022 16:23:01

## 2022-10-27 ENCOUNTER — Emergency Department (HOSPITAL_COMMUNITY): Payer: Medicare Other

## 2022-10-27 ENCOUNTER — Emergency Department (HOSPITAL_COMMUNITY)
Admission: EM | Admit: 2022-10-27 | Discharge: 2022-10-27 | Disposition: A | Discharge status: Home / Self Care | Payer: Medicare Other | Attending: Emergency Medicine | Admitting: Emergency Medicine

## 2022-10-27 DIAGNOSIS — W19XXXA Unspecified fall, initial encounter: Secondary | ICD-10-CM

## 2022-10-27 DIAGNOSIS — S81812A Laceration without foreign body, left lower leg, initial encounter: Secondary | ICD-10-CM | POA: Diagnosis not present

## 2022-10-27 DIAGNOSIS — F0390 Unspecified dementia without behavioral disturbance: Secondary | ICD-10-CM | POA: Diagnosis not present

## 2022-10-27 DIAGNOSIS — W06XXXA Fall from bed, initial encounter: Secondary | ICD-10-CM | POA: Diagnosis not present

## 2022-10-27 DIAGNOSIS — S0101XA Laceration without foreign body of scalp, initial encounter: Secondary | ICD-10-CM | POA: Insufficient documentation

## 2022-10-27 DIAGNOSIS — Z7901 Long term (current) use of anticoagulants: Secondary | ICD-10-CM | POA: Insufficient documentation

## 2022-10-27 DIAGNOSIS — Y92122 Bedroom in nursing home as the place of occurrence of the external cause: Secondary | ICD-10-CM | POA: Insufficient documentation

## 2022-10-27 DIAGNOSIS — R600 Localized edema: Secondary | ICD-10-CM | POA: Insufficient documentation

## 2022-10-27 LAB — BASIC METABOLIC PANEL
Anion gap: 12 (ref 5–15)
BUN: 18 mg/dL (ref 8–23)
CO2: 23 mmol/L (ref 22–32)
Calcium: 8.8 mg/dL — ABNORMAL LOW (ref 8.9–10.3)
Chloride: 102 mmol/L (ref 98–111)
Creatinine, Ser: 1.23 mg/dL — ABNORMAL HIGH (ref 0.44–1.00)
GFR, Estimated: 40 mL/min — ABNORMAL LOW (ref >60)
Glucose, Bld: 92 mg/dL (ref 70–99)
Potassium: 3.9 mmol/L (ref 3.5–5.1)
Sodium: 137 mmol/L (ref 135–145)

## 2022-10-27 LAB — I-STAT CHEM 8, ED
BUN: 19 mg/dL (ref 8–23)
Calcium, Ion: 1.1 mmol/L — ABNORMAL LOW (ref 1.15–1.40)
Chloride: 105 mmol/L (ref 98–111)
Creatinine, Ser: 1.4 mg/dL — ABNORMAL HIGH (ref 0.44–1.00)
Glucose, Bld: 89 mg/dL (ref 70–99)
HCT: 34 % — ABNORMAL LOW (ref 36.0–46.0)
Hemoglobin: 11.6 g/dL — ABNORMAL LOW (ref 12.0–15.0)
Potassium: 3.9 mmol/L (ref 3.5–5.1)
Sodium: 138 mmol/L (ref 135–145)
TCO2: 23 mmol/L (ref 22–32)

## 2022-10-27 LAB — CBC WITH DIFFERENTIAL/PLATELET
Abs Immature Granulocytes: 0.02 10*3/uL (ref 0.00–0.07)
Basophils Absolute: 0 10*3/uL (ref 0.0–0.1)
Basophils Relative: 1 %
Eosinophils Absolute: 0.2 10*3/uL (ref 0.0–0.5)
Eosinophils Relative: 3 %
HCT: 34.5 % — ABNORMAL LOW (ref 36.0–46.0)
Hemoglobin: 11 g/dL — ABNORMAL LOW (ref 12.0–15.0)
Immature Granulocytes: 0 %
Lymphocytes Relative: 25 %
Lymphs Abs: 1.4 10*3/uL (ref 0.7–4.0)
MCH: 28.4 pg (ref 26.0–34.0)
MCHC: 31.9 g/dL (ref 30.0–36.0)
MCV: 88.9 fL (ref 80.0–100.0)
Monocytes Absolute: 0.6 10*3/uL (ref 0.1–1.0)
Monocytes Relative: 10 %
Neutro Abs: 3.3 10*3/uL (ref 1.7–7.7)
Neutrophils Relative %: 61 %
Platelets: 189 10*3/uL (ref 150–400)
RBC: 3.88 MIL/uL (ref 3.87–5.11)
RDW: 16.1 % — ABNORMAL HIGH (ref 11.5–15.5)
WBC: 5.5 10*3/uL (ref 4.0–10.5)
nRBC: 0 % (ref 0.0–0.2)

## 2022-10-27 MED ORDER — LIDOCAINE-EPINEPHRINE-TETRACAINE (LET) TOPICAL GEL
3.0000 mL | Freq: Once | TOPICAL | Status: AC
  Administered 2022-10-27: 3 mL via TOPICAL
  Filled 2022-10-27: qty 3

## 2022-10-27 NOTE — ED Provider Notes (Signed)
[name]   CSN: [id number] Arrival date & time: 10/27/22  0034     History  Chief Complaint  Patient presents with   [name]    [name] is a 89 y.o. female.  The history is provided by the patient, the EMS personnel and medical records.  Fall  [name] is a 89 y.o. female who presents to the Emergency Department complaining of fall.  She presents to the emergency department by EMS from her nursing facility for evaluation of injuries following a fall.  She states that she was getting out of bed to turn off the lights when she lost her balance and fell, striking her head.  No loss of consciousness.  She does take Eliquis.  She states that she is unsteady at baseline.  No reports of recent illnesses.  She is currently being followed by podiatry for a toe wound.  Denies headache, neck pain, chest pain, difficulty breathing, nausea, vomiting.   She has a past medical history of dementia, atrial fibrillation on low-dose Eliquis, history of PE status post IVC filter  Home Medications Prior to Admission medications   Medication Sig Start Date End Date Taking? Authorizing Provider  albuterol (PROVENTIL HFA;VENTOLIN HFA) 108 (90 Base) MCG/ACT inhaler Inhale 2 puffs into the lungs every 6 (six) hours as needed for wheezing or shortness of breath. 09/01/18   Jodelle Green, FNP  ALPRAZolam Duanne Moron) 0.5 MG tablet Take 0.5 mg by mouth at bedtime as needed for anxiety.    [provider]  apixaban (ELIQUIS) 2.5 MG TABS tablet Take 2.5 mg by mouth 2 (two) times daily.    [provider]  baclofen (LIORESAL) 10 MG tablet Take 10 mg by mouth 3 (three) times daily.    [provider]  cephALEXin (KEFLEX) 250 MG capsule Take 1 capsule (250 mg total) by mouth 3 (three) times daily. 04/26/22   Paulette Blanch, MD  Cholecalciferol (VITAMIN D PO) Take 2,000 mg by mouth daily.    [provider]  cholestyramine  light (PREVALITE) 4 g packet  05/14/20   [provider]  denosumab (PROLIA) 60 MG/ML SOSY injection Prolia 60 mg/mL subcutaneous syringe  Inject 1 mL by subcutaneous route.    [provider]  diphenoxylate-atropine (LOMOTIL) 2.5-0.025 MG tablet Take 1 tablet by mouth 4 (four) times daily as needed for diarrhea or loose stools. 11/28/19   [name], MD  fluticasone (FLONASE) 50 MCG/ACT nasal spray Place 1 spray into both nostrils daily.    [provider]  loperamide (IMODIUM) 2 MG capsule Take 2 mg by mouth as needed for diarrhea or loose stools.    [provider]  losartan (COZAAR) 25 MG tablet TAKE 1 TABLET DAILY 09/30/20   [name], MD  metoprolol tartrate (LOPRESSOR) 50 MG tablet TAKE 1 TABLET TWICE A DAY 07/07/21   [name], MD  mirtazapine (REMERON) 7.5 MG tablet Take 1 tablet (7.5 mg total) by mouth at bedtime. 04/14/21   [name], MD  montelukast (SINGULAIR) 10 MG tablet  07/22/19   [provider]  triamcinolone cream (KENALOG) 0.1 % Apply 1 application topically 2 (two) times daily. For up to 7 days. Do not apply to the face. 04/19/19   [name], MD  Vibegron (GEMTESA) 75 MG TABS Take 75 mg by mouth daily. 07/27/21   [name], MD  XIIDRA 5 % SOLN Place 1-2 drops  into both eyes daily.  12/06/17   [provider]      Allergies    Erythromycin and Sulfa drugs cross reactors    Review of Systems   Review of Systems  All other systems reviewed and are negative.   Physical Exam Updated Vital Signs BP (!) 134/90   Pulse (!) 119   Temp 97.8 F (36.6 C) (Oral)   Resp 20   SpO2 97%  Physical Exam Vitals and nursing [name] reviewed.  Constitutional:      Appearance: She is well-developed.  HENT:     Head: Normocephalic.     Comments: There is a vertical laceration to the occipital scalp just posterior to the right ear. Cardiovascular:     Rate and Rhythm: Normal rate and  regular rhythm.     Heart sounds: No murmur heard. Pulmonary:     Effort: Pulmonary effort is normal. No respiratory distress.     Breath sounds: Normal breath sounds.  Abdominal:     Palpations: Abdomen is soft.     Tenderness: There is no abdominal tenderness. There is no guarding or rebound.  Musculoskeletal:        General: No tenderness.     Comments: 1+ pitting edema to bilateral lower extremities.  2+ DP pulses.  There is a small amount of serous drainage from a superficial skin tear to the left shin.  No tenderness to the hips bilaterally.  Skin:    General: Skin is warm and dry.  Neurological:     Mental Status: She is alert and oriented to person, place, and time.  Psychiatric:        Behavior: Behavior normal.     ED Results / Procedures / Treatments   Labs (all labs ordered are listed, but only abnormal results are displayed) Labs Reviewed  BASIC METABOLIC PANEL - Abnormal; Notable for the following components:      Result Value   Creatinine, Ser 1.23 (*)    Calcium 8.8 (*)    GFR, Estimated 40 (*)    All other components within normal limits  CBC WITH DIFFERENTIAL/PLATELET - Abnormal; Notable for the following components:   Hemoglobin 11.0 (*)    HCT 34.5 (*)    RDW 16.1 (*)    All other components within normal limits  I-STAT CHEM 8, ED - Abnormal; Notable for the following components:   Creatinine, Ser 1.40 (*)    Calcium, Ion 1.10 (*)    Hemoglobin 11.6 (*)    HCT 34.0 (*)    All other components within normal limits    EKG None  Radiology DG Pelvis Portable  Result Date: 10/27/2022 CLINICAL DATA:  Level 2 trauma.  Fall injury on blood thinners. EXAM: PORTABLE PELVIS 1-2 VIEWS COMPARISON:  Flat plate abdomen film [id number] FINDINGS: There is no evidence of pelvic fracture or diastasis. No pelvic bone lesions are seen. There is mild symmetric degenerative change for age of the bilateral hip joints. Spurring both SI joints. The bone mineralization is  diffusely osteopenic. IMPRESSION: Osteopenia and degenerative change. No evidence of pelvic fracture or diastasis. Electronically Signed   By: Telford Nab M.D.   On: 10/27/2022 01:24   CT Head Wo Contrast  Result Date: 10/27/2022 CLINICAL DATA:  Trauma. EXAM: CT HEAD WITHOUT CONTRAST CT CERVICAL SPINE WITHOUT CONTRAST TECHNIQUE: Multidetector CT imaging of the head and cervical spine was performed following the standard protocol without intravenous contrast. Multiplanar CT image reconstructions of the cervical spine were also  generated. RADIATION DOSE REDUCTION: This exam was performed according to the departmental dose-optimization program which includes automated exposure control, adjustment of the mA and/or kV according to patient size and/or use of iterative reconstruction technique. COMPARISON:  CT dated 04/26/2022. FINDINGS: CT HEAD FINDINGS Brain: Moderate age-related atrophy and chronic microvascular ischemic changes. There is no acute intracranial hemorrhage. No mass effect or midline shift. No extra-axial fluid collection. Vascular: No hyperdense vessel or unexpected calcification. Skull: Normal. Negative for fracture or focal lesion. Sinuses/Orbits: No acute finding. Other: None CT CERVICAL SPINE FINDINGS Alignment: No acute subluxation. Skull base and vertebrae: No acute fracture.  Osteopenia. Soft tissues and spinal canal: No prevertebral fluid or swelling. No visible canal hematoma. Disc levels:  No acute findings.  Extensive degenerative changes. Upper chest: Mild emphysema. Other: Bilateral carotid bulb calcified plaques. IMPRESSION: 1. No acute intracranial pathology. Moderate age-related atrophy and chronic microvascular ischemic changes. 2. No acute/traumatic cervical spine pathology. Electronically Signed   By: Anner Crete M.D.   On: 10/27/2022 01:12   CT Cervical Spine Wo Contrast  Result Date: 10/27/2022 CLINICAL DATA:  Trauma. EXAM: CT HEAD WITHOUT CONTRAST CT CERVICAL SPINE  WITHOUT CONTRAST TECHNIQUE: Multidetector CT imaging of the head and cervical spine was performed following the standard protocol without intravenous contrast. Multiplanar CT image reconstructions of the cervical spine were also generated. RADIATION DOSE REDUCTION: This exam was performed according to the departmental dose-optimization program which includes automated exposure control, adjustment of the mA and/or kV according to patient size and/or use of iterative reconstruction technique. COMPARISON:  CT dated 04/26/2022. FINDINGS: CT HEAD FINDINGS Brain: Moderate age-related atrophy and chronic microvascular ischemic changes. There is no acute intracranial hemorrhage. No mass effect or midline shift. No extra-axial fluid collection. Vascular: No hyperdense vessel or unexpected calcification. Skull: Normal. Negative for fracture or focal lesion. Sinuses/Orbits: No acute finding. Other: None CT CERVICAL SPINE FINDINGS Alignment: No acute subluxation. Skull base and vertebrae: No acute fracture.  Osteopenia. Soft tissues and spinal canal: No prevertebral fluid or swelling. No visible canal hematoma. Disc levels:  No acute findings.  Extensive degenerative changes. Upper chest: Mild emphysema. Other: Bilateral carotid bulb calcified plaques. IMPRESSION: 1. No acute intracranial pathology. Moderate age-related atrophy and chronic microvascular ischemic changes. 2. No acute/traumatic cervical spine pathology. Electronically Signed   By: Anner Crete M.D.   On: 10/27/2022 01:12   DG Chest Port 1 View  Result Date: 10/27/2022 CLINICAL DATA:  Fall EXAM: PORTABLE CHEST 1 VIEW COMPARISON:  05/31/2021 FINDINGS: Cardiomegaly. Aortic atherosclerosis. No confluent opacities or effusions. No acute bony abnormality. IMPRESSION: Cardiomegaly.  No active disease. Electronically Signed   By: Rolm Baptise M.D.   On: 10/27/2022 00:59    Procedures .Marland KitchenLaceration Repair  Date/Time: 10/27/2022 3:44 AM  Performed by: Quintella Reichert, MD Authorized by: Quintella Reichert, MD   Consent:    Consent obtained:  Verbal   Consent given by:  Patient   Risks discussed:  Infection, pain and poor cosmetic result Universal protocol:    Patient identity confirmed:  Verbally with patient Laceration details:    Location:  Scalp   Length (cm):  3.5 Exploration:    Hemostasis achieved with:  Direct pressure and LET Treatment:    Area cleansed with:  Chlorhexidine   Amount of cleaning:  Standard Skin repair:    Repair method:  Staples   Number of staples:  3 Approximation:    Approximation:  Close Repair type:    Repair type:  Simple Post-procedure details:  Dressing:  Open (no dressing)   Procedure completion:  Tolerated well, no immediate complications     Medications Ordered in ED Medications  lidocaine-EPINEPHrine-tetracaine (LET) topical gel (3 mLs Topical Given 10/27/22 0328)    ED Course/ Medical Decision Making/ A&P                             Medical Decision Making Amount and/or Complexity of Data Reviewed Labs: ordered. Radiology: ordered.   Patient here as a level 2 trauma alert following fall on thinners.  She does have a laceration to the posterior scalp.  Wound was repaired per [name].  CT head and C-spine are negative for acute traumatic abnormality.  Labs with mild drop in hemoglobin when compared to priors 2 years ago-no reports of bleeding at home and no evidence of ongoing bleeding in the ED.  Plain films are negative for acute fracture or additional injury.  She did at 1 point have some pain on transfer from the stretcher to the [place] in her hip.  On repeat evaluation there is no pain on active or passive range of motion or palpation to the right lower extremity in the hip, knee, ankle.  She is able to stand in the emergency department without recurrent pain.  Doubt occult fracture.  Discussed with patient as well as son at the bedside findings of studies.  Plan to discharge with  outpatient follow-up and return precautions.        Final Clinical Impression(s) / ED Diagnoses Final diagnoses:  Fall, initial encounter  Laceration of scalp, initial encounter    Rx / DC Orders ED Discharge Orders     None         Quintella Reichert, MD 10/27/22 [phone]

## 2022-10-27 NOTE — ED Notes (Signed)
Trauma Response Nurse Documentation   [name] is a 89 y.o. female arriving to [facility] ED via [facility]  On Eliquis (apixaban) daily. Trauma was activated as a Level 2 by Isla Pence based on the following trauma criteria Elderly patients > 65 with head trauma on anti-coagulation (excluding ASA). Trauma team at the bedside on patient arrival.   Patient cleared for CT by Dr. Ralene Bathe. Pt transported to CT with trauma response nurse present to monitor. RN remained with the patient throughout their absence from the department for clinical observation.   GCS 15.  History   Past Medical History:  Diagnosis Date   A-fib [facility])    Allergy    Breast cancer ([place]) 07/22/2012   T1c, Nx carcinoma left breast, ER 90%, PR 90%, HER-2/neu not over expressed.   Hypertension    Insomnia    Osteoporosis    Prolia 10/2012   PAT (paroxysmal atrial tachycardia) ([place])    a. Dx 03/2019 - improved w/ beta blocker; b. 03/2019 Echo: EF 55-60%. Impaired relaxation. RVSP 48.7mmHg. Mild to mod MR/TR.   Pulmonary embolus ([place]) 2009   S/P IVC filter 2009     Past Surgical History:  Procedure Laterality Date   BLADDER SURGERY     BREAST SURGERY Left 2013   mastectomy   CHOLECYSTECTOMY     HERNIA REPAIR     MASTECTOMY Left 2013   BREAST CA   REPLACEMENT TOTAL KNEE     right   TONSILLECTOMY         Initial Focused Assessment (If applicable, or please see trauma documentation): Airway-- intact, no visible obstruction Breathing-- spontaneous, unlabored Circulation-- laceration to back of head, bleeding controlled on arrival.  CT's Completed:   CT Head and CT C-Spine   Interventions:  See event summary  Plan for disposition:  Discharge home   Consults completed:  none at 0200.  Event Summary: Patient brought in by [facility] from SNF. Patient had a mechanical fall tonight, striking her head on the ground. Denies loss of consciousness. Patient arrives A&Ox3, GCS 15.  Manual BP obtained. Labs obtained. Xray chest completed. Patient to CT with TRN and Primary RN. CT head and c-spine completed. While in CT, patient began complaining of right hip/leg pain. Xray pelvis ordered at this time.  MTP Summary (If applicable):  N/A  Bedside handoff with ED RN Tiffany.    Trudee Kuster  Trauma Response RN  Please call TRN at [phone] for further assistance.

## 2022-10-27 NOTE — Discharge Instructions (Signed)
You had staples placed in the emergency department.  These will need to be removed in the next 5 to 7 days.  Get rechecked sooner if you have any new or concerning symptoms.

## 2022-10-27 NOTE — ED Notes (Signed)
Pt able to stand and ambulated with assistance to the chair and back. Pt reports "this feels normal and I'm ready to go."

## 2022-10-29 ENCOUNTER — Encounter: Payer: Medicare Other | Attending: Physician Assistant | Admitting: Physician Assistant

## 2022-10-29 DIAGNOSIS — Z853 Personal history of malignant neoplasm of breast: Secondary | ICD-10-CM | POA: Diagnosis not present

## 2022-10-29 DIAGNOSIS — Z96651 Presence of right artificial knee joint: Secondary | ICD-10-CM | POA: Insufficient documentation

## 2022-10-29 DIAGNOSIS — Z7901 Long term (current) use of anticoagulants: Secondary | ICD-10-CM | POA: Diagnosis not present

## 2022-10-29 DIAGNOSIS — I480 Paroxysmal atrial fibrillation: Secondary | ICD-10-CM | POA: Diagnosis not present

## 2022-10-29 DIAGNOSIS — I87332 Chronic venous hypertension (idiopathic) with ulcer and inflammation of left lower extremity: Secondary | ICD-10-CM | POA: Diagnosis present

## 2022-10-29 DIAGNOSIS — L98492 Non-pressure chronic ulcer of skin of other sites with fat layer exposed: Secondary | ICD-10-CM | POA: Insufficient documentation

## 2022-10-29 DIAGNOSIS — I10 Essential (primary) hypertension: Secondary | ICD-10-CM | POA: Diagnosis not present

## 2022-10-29 NOTE — Progress Notes (Signed)
SYARA, [employer] ([id number]) [id number] Page 1 of 1 Visit Report for 10/29/2022 Fall Risk Assessment Details Patient Name: Date of Service: Elveria Royals, DO RCA S 10/29/2022 3:00 PM Medical Record Number: [id number] Patient Account Number: [id number] Date of Birth/Sex: Treating RN: October 16, 1925 (89 y.o. Valetta Close Primary Care Glenis Musolf: Frazier Richards Other Clinician: Referring Kaylaann Mountz: Treating Mihir Flanigan/Extender: [name] in Treatment: 15 Fall Risk Assessment Items Have you had 2 or more falls in the last 12 monthso 0 No Have you had any fall that resulted in injury in the last 12 monthso 0 Yes FALLS RISK SCREEN History of falling - immediate or within 3 months 25 Yes Secondary diagnosis (Do you have 2 or more medical diagnoseso) 0 No Ambulatory aid None/bed rest/wheelchair/nurse 0 No Crutches/cane/walker 15 Yes Furniture 0 No Intravenous therapy Access/Saline/Heparin Lock 0 No Gait/Transferring Normal/ bed rest/ wheelchair 0 No Weak (short steps with or without shuffle, stooped but able to lift head while walking, may seek 10 Yes support from furniture) Impaired (short steps with shuffle, may have difficulty arising from chair, head down, impaired 0 No balance) Mental Status Oriented to own ability 0 No Electronic Signature(s) Signed: 10/29/2022 4:39:01 PM By: Levora Dredge Entered By: Levora Dredge on 10/29/2022 16:39:01

## 2022-10-29 NOTE — Progress Notes (Addendum)
[name], [name] ([id number]) [id number] Page 1 of 6 Visit Report for 10/29/2022 Chief Complaint Document Details Patient Name: Date of Service: [name] Royals, DO RCA S 10/29/2022 3:00 PM Medical Record Number: [id number] Patient Account Number: [id number] Date of Birth/Sex: Treating RN: Jul 17, 1926 (89 y.o. [name] Primary Care Provider: Frazier Richards Other Clinician: Referring Provider: Treating Provider/Extender: [name] in Treatment: 15 Information Obtained from: Patient Chief Complaint Right 2nd toe pressure ulcer Electronic Signature(s) Signed: 10/29/2022 3:03:39 PM By: Worthy Keeler PA-C Entered By: Worthy Keeler on 10/29/2022 15:03:39 -------------------------------------------------------------------------------- HPI Details Patient Name: Date of Service: [name] MSEY, DO RCA S 10/29/2022 3:00 PM Medical Record Number: [id number] Patient Account Number: [id number] Date of Birth/Sex: Treating RN: 1925/09/10 (89 y.o. [name] Primary Care Provider: Frazier Richards Other Clinician: Referring Provider: Treating Provider/Extender: [name] in Treatment: 15 History of Present Illness HPI Description: 07-16-2022 upon evaluation today patient presents for initial inspection here in our clinic concerning a wound on the left lateral lower extremity. This is an area that has been present since around May 27, 2022 according to what the patient tells me today. With that being said she does live in assisted living facility. She is seen with her son in the office today. As far as past medical history is concerned she does have a history of breast cancer in 2003 which she did obviously survive. She also had a right knee replacement. In 2009 she had an IVC filter and heart ablation due to atrial fibrillation. With regard to the remainder of her medical history again she is on anticoagulant  therapy at this point due to atrial fibrillation, she has hypertension, and chronic venous insufficiency based on what I am seeing currently. In regard to the wound she is currently on Keflex which should be completed on the 25th of this month. Other than that she has had various things applied to the wound including different creams but then at other times she also states that she has had nothing put on and been told to just leave it open to air. There is been a lot of confusion about the best treatment course with regard to her wound which is obviously not good. I discussed with her today that I think we can have the best plan going forward and that that will include keeping this covered but nonetheless it should help it to heal the most effectively as well. 12/29; left lower leg chronic venous insufficiency. She complains fairly bitterly about the tightness of the wrap. She was put on antibiotics last week which I believe is Keflex. She lives in an assisted living 07-31-2021 upon evaluation today patient appears to be doing well currently in regard to her wound. She is actually been tolerating the dressing changes this is measuring smaller and looking better. I think we are on the right track I do believe that the current compression wrap is helping significantly with her Ealing at this point. Overall I do not see any signs of infection locally nor systemically which is great news. No fevers, chills, nausea, vomiting, or diarrhea. 08-13-2022 upon evaluation today patient's wound on her leg actually showing signs of erythema around the edges of the wound that she is also still having quite a bit of drainage all things considered. Fortunately I do not see any evidence of infection systemically though locally I definitely see some evidence here both with erythema, warmth, and drainage. She has been using the silver alginate  dressing we have also had an 3 layer compression wrap though she did not feel good  felt very tight at the top of the wrap which is the main concern she has today. 1/25; the patient's wound is smaller today. She is on doxycycline. Culture result from last week showed MRSA but fortunately sensitive to doxycycline which she is taking. She is using TCA Sorbact under 3 layer compression. 08-27-2022 upon evaluation today patient appears to be doing well currently in regard to her left leg ulcer which does not appear to be doing poorly at all in fact this appears to be healing quite nicely. I am extremely pleased with where we stand I do not see any signs of active infection locally nor systemically which is great news. No fevers, chills, nausea, vomiting, or diarrhea. 09-03-2022 upon evaluation patient's wound is actually showing signs of significant improvement I am actually very pleased with where we stand currently. I do not see any signs of active knee flexion at this time which is great news. No fevers, chills, nausea, vomiting, or diarrhea. 09-10-2022 upon evaluation today patient appears to be doing well currently in regard to her wounds. Both the toe as well as the leg appear to be showing signs of good improvement. Fortunately I do not see any evidence of active infection at this time which is great news. She does have her compression socks. 09-24-2022 upon evaluation today patient's wound actually is showing signs of doing much worse than last time I saw her in fact it was pretty much healed we were just seeing her today to ensure that it was still continue to be. Unfortunately not only is open that actually seems to be open down to the joint. I am somewhat concerned about how quickly this went downhill I am not sure if this is an indication of a severe infection that was hide he is in not apparent last time although this is the worst the wound has been it was never even [name] to this bad when I was seeing her in the past several weeks. [name], [name] ([id number])  [id number] Page 2 of 6 3/6This is a patient who came in last week with a opening over the proximal interphalangeal joint of the right second toe. Very painful. Her original wound was on the left side. Culture done this week showed pansensitive Proteus the patient is on doxycycline. An x-ray came from the facility there was no fracture small amount of osteoarthritis but no obvious bony abnormalities. 10-15-2022 upon evaluation today patient's toe ulcer actually showed signs of improvement compared to last time I saw her. With that being said this is definitely not looking as good as what we previously saw when she was healed but is looking better than last time I saw her right before going out of town. I am going to perform some sharp debridement today however to clear away some of the necrotic debris and I discussed that with the patient and her son. 10-22-2022 upon evaluation today patient appears to be doing much better in regard to her wound the toe is actually showing signs of excellent improvement. I do believe that the [facility] has been a very good for her as far as getting this infection moving in the right direction. I do not see any signs of active infection systemically nor locally at this point. No fevers, chills, nausea, vomiting, or diarrhea. 10-29-2022 upon evaluation today patient appears to be doing well currently in regard to her wound which is  actually getting [name] to being completely resolved based on what I am seeing. I am very pleased with where things stand today. I do not see any evidence of active infection locally or systemically which is great news. Electronic Signature(s) Signed: 10/29/2022 4:56:14 PM By: Worthy Keeler PA-C Entered By: Worthy Keeler on 10/29/2022 16:56:14 -------------------------------------------------------------------------------- Physical Exam Details Patient Name: Date of Service: [name] MSEY, DO RCA S 10/29/2022 3:00  PM Medical Record Number: [id number] Patient Account Number: [id number] Date of Birth/Sex: Treating RN: March 06, 1926 (89 y.o. [name] Primary Care Provider: Frazier Richards Other Clinician: Referring Provider: Treating Provider/Extender: [name] in Treatment: 81 Constitutional Well-nourished and well-hydrated in no acute distress. Respiratory normal breathing without difficulty. Psychiatric this patient is able to make decisions and demonstrates good insight into disease process. Alert and Oriented x 3. pleasant and cooperative. Notes Upon inspection patient's wound bed actually showed signs of good granulation epithelization at this point. Fortunately I do not see any evidence of active infection locally nor systemically which is great news and overall I am extremely pleased with where we stand today. Electronic Signature(s) Signed: 10/29/2022 4:56:28 PM By: Worthy Keeler PA-C Entered By: Worthy Keeler on 10/29/2022 16:56:28 -------------------------------------------------------------------------------- Physician Orders Details Patient Name: Date of Service: [name] MSEY, DO RCA S 10/29/2022 3:00 PM Medical Record Number: [id number] Patient Account Number: [id number] Date of Birth/Sex: Treating RN: Nov 26, 1925 (89 y.o. [name] Primary Care Provider: Frazier Richards Other Clinician: Referring Provider: Treating Provider/Extender: [name] in Treatment: 15 Verbal / Phone Orders: No Diagnosis Coding ICD-10 Coding Code Description [phone] Chronic venous hypertension (idiopathic) with ulcer and inflammation of left lower extremity L98.492 Non-pressure chronic ulcer of skin of other sites with fat layer exposed I10 Essential (primary) hypertension I48.0 Paroxysmal atrial fibrillation Z79.01 Long term (current) use of anticoagulants Bathing/ Shower/ Hygiene Wound #2R Right,Anterior SHAKEVA, CERPA ([id number]) [id number] Page 3 of 6 May shower with wound dressing protected with water repellent cover or cast protector. - Please keep covered when showering Edema Control - Lymphedema / Segmental Compressive Device / Other Bilateral Lower Extremities Patient to wear own compression stockings. Remove compression stockings every night before going to bed and put on every morning when getting up. - Wear compression stockings continuously except when due to shower. May remove to shower but replace after. Elevate, Exercise Daily and A void Standing for Long Periods of Time. Elevate legs to the level of the heart and pump ankles as often as possible Elevate leg(s) parallel to the floor when sitting. DO YOUR BEST to sleep in the bed at night. DO NOT sleep in your recliner. Long hours of sitting in a recliner leads to swelling of the legs and/or potential wounds on your backside. Additional Orders / Instructions Follow Nutritious Diet and Increase Protein Intake Medications-Please add to medication list. ntibiotics - ok to stop taking oral antibiotic per PA Stone P.O. A Wound Treatment Wound #2R - T Second oe Wound Laterality: Right, Anterior Cleanser: Soap and Water 1 x Per Day/30 Days Discharge Instructions: Gently cleanse wound with antibacterial soap, rinse and pat dry prior to dressing wounds Prim Dressing: Prisma 4.34 (in) 1 x Per Day/30 Days ary Discharge Instructions: Moisten w/normal saline or sterile water; Cover wound as directed. Do not remove from wound bed. Prim Dressing: Xeroform-HBD 2x2 (in/in) 1 x Per Day/30 Days ary Discharge Instructions: Apply Xeroform-HBD 2x2 (in/in) as directed Secondary Dressing: Conforming Guaze Roll-Small 1 x  Per Day/30 Days Discharge Instructions: Apply Conforming Stretch Guaze Bandage as directed Secondary Dressing: Tubular Elastic Net Bandage, Size1, 30 (yd) 1 x Per Day/30 Days Electronic Signature(s) Signed:  10/29/2022 4:55:39 PM By: Angelina Pih Signed: 10/30/2022 8:47:01 AM By: Lenda Kelp PA-C Entered By: Angelina Pih on 10/29/2022 16:35:17 -------------------------------------------------------------------------------- Problem List Details Patient Name: Date of Service: [name] Iran Sizer, DO RCA S 10/29/2022 3:00 PM Medical Record Number: [id number] Patient Account Number: [id number] Date of Birth/Sex: Treating RN: May 13, 1926 (89 y.o. Esmeralda Links Primary Care Provider: [name] Other Clinician: Referring Provider: Treating Provider/Extender: [name] in Treatment: 15 Active Problems ICD-10 Encounter Code Description Active Date MDM Diagnosis I87.332 Chronic venous hypertension (idiopathic) with ulcer and inflammation of left 07/16/2022 No Yes lower extremity L98.492 Non-pressure chronic ulcer of skin of other sites with fat layer exposed 10/22/2022 No Yes I10 Essential (primary) hypertension 07/16/2022 No Yes I48.0 Paroxysmal atrial fibrillation 07/16/2022 No Yes Z79.01 Long term (current) use of anticoagulants 07/16/2022 No Yes WILLENE, DIMATTIA ([id number]) [id number] Page 4 of 6 Inactive Problems Resolved Problems ICD-10 Code Description Active Date Resolved Date L97.822 Non-pressure chronic ulcer of other part of left lower leg with fat layer exposed 07/16/2022 07/16/2022 Electronic Signature(s) Signed: 10/29/2022 3:03:36 PM By: Lenda Kelp PA-C Entered By: Lenda Kelp on 10/29/2022 15:03:35 -------------------------------------------------------------------------------- Progress Note Details Patient Name: Date of Service: [name] MSEY, DO RCA S 10/29/2022 3:00 PM Medical Record Number: [id number] Patient Account Number: [id number] Date of Birth/Sex: Treating RN: 08/08/1925 (89 y.o. Esmeralda Links Primary Care Provider: [name] Other Clinician: Referring Provider: Treating Provider/Extender:  [name] in Treatment: 15 Subjective Chief Complaint Information obtained from Patient Right 2nd toe pressure ulcer History of Present Illness (HPI) 07-16-2022 upon evaluation today patient presents for initial inspection here in our clinic concerning a wound on the left lateral lower extremity. This is an area that has been present since around May 27, 2022 according to what the patient tells me today. With that being said she does live in assisted living facility. She is seen with her son in the office today. As far as past medical history is concerned she does have a history of breast cancer in 2003 which she did obviously survive. She also had a right knee replacement. In 2009 she had an IVC filter and heart ablation due to atrial fibrillation. With regard to the remainder of her medical history again she is on anticoagulant therapy at this point due to atrial fibrillation, she has hypertension, and chronic venous insufficiency based on what I am seeing currently. In regard to the wound she is currently on Keflex which should be completed on the 25th of this month. Other than that she has had various things applied to the wound including different creams but then at other times she also states that she has had nothing put on and been told to just leave it open to air. There is been a lot of confusion about the best treatment course with regard to her wound which is obviously not good. I discussed with her today that I think we can have the best plan going forward and that that will include keeping this covered but nonetheless it should help it to heal the most effectively as well. 12/29; left lower leg chronic venous insufficiency. She complains fairly bitterly about the tightness of the wrap. She was put on antibiotics last week which I believe is Keflex. She lives in an assisted living 07-31-2021  upon evaluation today patient appears to be doing well currently  in regard to her wound. She is actually been tolerating the dressing changes this is measuring smaller and looking better. I think we are on the right track I do believe that the current compression wrap is helping significantly with her Ealing at this point. Overall I do not see any signs of infection locally nor systemically which is great news. No fevers, chills, nausea, vomiting, or diarrhea. 08-13-2022 upon evaluation today patient's wound on her leg actually showing signs of erythema around the edges of the wound that she is also still having quite a bit of drainage all things considered. Fortunately I do not see any evidence of infection systemically though locally I definitely see some evidence here both with erythema, warmth, and drainage. She has been using the silver alginate dressing we have also had an 3 layer compression wrap though she did not feel good felt very tight at the top of the wrap which is the main concern she has today. 1/25; the patient's wound is smaller today. She is on doxycycline. Culture result from last week showed MRSA but fortunately sensitive to doxycycline which she is taking. She is using TCA Sorbact under 3 layer compression. 08-27-2022 upon evaluation today patient appears to be doing well currently in regard to her left leg ulcer which does not appear to be doing poorly at all in fact this appears to be healing quite nicely. I am extremely pleased with where we stand I do not see any signs of active infection locally nor systemically which is great news. No fevers, chills, nausea, vomiting, or diarrhea. 09-03-2022 upon evaluation patient's wound is actually showing signs of significant improvement I am actually very pleased with where we stand currently. I do not see any signs of active knee flexion at this time which is great news. No fevers, chills, nausea, vomiting, or diarrhea. 09-10-2022 upon evaluation today patient appears to be doing well currently in regard  to her wounds. Both the toe as well as the leg appear to be showing signs of good improvement. Fortunately I do not see any evidence of active infection at this time which is great news. She does have her compression socks. 09-24-2022 upon evaluation today patient's wound actually is showing signs of doing much worse than last time I saw her in fact it was pretty much healed we were just seeing her today to ensure that it was still continue to be. Unfortunately not only is open that actually seems to be open down to the joint. I am somewhat concerned about how quickly this went downhill I am not sure if this is an indication of a severe infection that was hide he is in not apparent last time although this is the worst the wound has been it was never even [name] to this bad when I was seeing her in the past several weeks. 3/6This is a patient who came in last week with a opening over the proximal interphalangeal joint of the right second toe. Very painful. Her original wound was on the left side. Culture done this week showed pansensitive Proteus the patient is on doxycycline. An x-ray came from the facility there was no fracture small amount of osteoarthritis but no obvious bony abnormalities. JESSENYA, BERDAN ([id number]) [id number] Page 5 of 6 10-15-2022 upon evaluation today patient's toe ulcer actually showed signs of improvement compared to last time I saw her. With that being said this is definitely not looking as  good as what we previously saw when she was healed but is looking better than last time I saw her right before going out of town. I am going to perform some sharp debridement today however to clear away some of the necrotic debris and I discussed that with the patient and her son. 10-22-2022 upon evaluation today patient appears to be doing much better in regard to her wound the toe is actually showing signs of excellent improvement. I do believe that the [facility]  has been a very good for her as far as getting this infection moving in the right direction. I do not see any signs of active infection systemically nor locally at this point. No fevers, chills, nausea, vomiting, or diarrhea. 10-29-2022 upon evaluation today patient appears to be doing well currently in regard to her wound which is actually getting [name] to being completely resolved based on what I am seeing. I am very pleased with where things stand today. I do not see any evidence of active infection locally or systemically which is great news. Objective Constitutional Well-nourished and well-hydrated in no acute distress. Vitals Time Taken: 3:18 PM, Weight: 106 lbs, Temperature: 97.8 F, Pulse: 66 bpm, Respiratory Rate: 18 breaths/min, Blood Pressure: 138/82 mmHg. Respiratory normal breathing without difficulty. Psychiatric this patient is able to make decisions and demonstrates good insight into disease process. Alert and Oriented x 3. pleasant and cooperative. General Notes: Upon inspection patient's wound bed actually showed signs of good granulation epithelization at this point. Fortunately I do not see any evidence of active infection locally nor systemically which is great news and overall I am extremely pleased with where we stand today. Integumentary (Hair, Skin) Wound #2R status is Open. Original cause of wound was Pressure Injury. The date acquired was: 09/14/2022. The wound has been in treatment 9 weeks. The wound is located on the Right,Anterior T Second. The wound measures 0.2cm length x 0.3cm width x 0.1cm depth; 0.047cm^2 area and 0.005cm^3 volume. oe There is Fat Layer (Subcutaneous Tissue) exposed. There is no tunneling or undermining noted. There is a medium amount of serosanguineous drainage noted. There is no granulation within the wound bed. There is a large (67-100%) amount of necrotic tissue within the wound bed including Adherent Slough. Assessment Active  Problems ICD-10 Chronic venous hypertension (idiopathic) with ulcer and inflammation of left lower extremity Non-pressure chronic ulcer of skin of other sites with fat layer exposed Essential (primary) hypertension Paroxysmal atrial fibrillation Long term (current) use of anticoagulants Plan Bathing/ Shower/ Hygiene: Wound #2R Right,Anterior T Second: oe May shower with wound dressing protected with water repellent cover or cast protector. - Please keep covered when showering Edema Control - Lymphedema / Segmental Compressive Device / Other: Patient to wear own compression stockings. Remove compression stockings every night before going to bed and put on every morning when getting up. - Wear compression stockings continuously except when due to shower. May remove to shower but replace after. Elevate, Exercise Daily and Avoid Standing for Long Periods of Time. Elevate legs to the level of the heart and pump ankles as often as possible Elevate leg(s) parallel to the floor when sitting. DO YOUR BEST to sleep in the bed at night. DO NOT sleep in your recliner. Long hours of sitting in a recliner leads to swelling of the legs and/or potential wounds on your backside. Additional Orders / Instructions: Follow Nutritious Diet and Increase Protein Intake Medications-Please add to medication list.: P.O. Antibiotics - ok to stop taking oral  antibiotic per PA Stone WOUND #2R: - T Second Wound Laterality: Right, Anterior oe Cleanser: Soap and Water 1 x Per Day/30 Days Discharge Instructions: Gently cleanse wound with antibacterial soap, rinse and pat dry prior to dressing wounds Prim Dressing: Prisma 4.34 (in) 1 x Per Day/30 Days ary Discharge Instructions: Moisten w/normal saline or sterile water; Cover wound as directed. Do not remove from wound bed. Prim Dressing: Xeroform-HBD 2x2 (in/in) 1 x Per Day/30 Days ary Discharge Instructions: Apply Xeroform-HBD 2x2 (in/in) as directed KAMIKA, PIRRELLO  ([id number]) [id number] Page 6 of 6 Secondary Dressing: Conforming Guaze Roll-Small 1 x Per Day/30 Days Discharge Instructions: Apply Conforming Stretch Guaze Bandage as directed Secondary Dressing: Tubular Elastic Net Bandage, Size1, 30 (yd) 1 x Per Day/30 Days 1. I am going to recommend that we have the patient continue to monitor for any signs of infection or worsening. Based on what I am seeing I do believe that we are headed in the right direction however. 2. I am also can recommend patient should continue with the Xeroform gauze which I think is really doing quite well. We are using a little bit of collagen under the sweats is doing an excellent job as well. We will see patient back for reevaluation in 1 week here in the clinic. If anything worsens or changes patient will contact our office for additional recommendations. Electronic Signature(s) Signed: 10/29/2022 4:59:51 PM By: Lenda Kelp PA-C Entered By: Lenda Kelp on 10/29/2022 16:59:50 -------------------------------------------------------------------------------- SuperBill Details Patient Name: Date of Service: [name] Iran Sizer, DO RCA S 10/29/2022 Medical Record Number: [id number] Patient Account Number: [id number] Date of Birth/Sex: Treating RN: 1925/07/31 (89 y.o. Philbert Riser, Luther Parody Primary Care Provider: [name] Other Clinician: Referring Provider: Treating Provider/Extender: [name] in Treatment: 15 Diagnosis Coding ICD-10 Codes Code Description [id number] Chronic venous hypertension (idiopathic) with ulcer and inflammation of left lower extremity L98.492 Non-pressure chronic ulcer of skin of other sites with fat layer exposed I10 Essential (primary) hypertension I48.0 Paroxysmal atrial fibrillation Z79.01 Long term (current) use of anticoagulants Facility Procedures : CPT4 Code: [id number] Description: 99213 - WOUND CARE VISIT-LEV 3 EST  PT Modifier: Quantity: 1 Physician Procedures : CPT4 Code Description Modifier [id number] 99213 - WC PHYS LEVEL 3 - EST PT ICD-10 Diagnosis Description I87.332 Chronic venous hypertension (idiopathic) with ulcer and inflammation of left lower extremity L98.492 Non-pressure chronic ulcer of skin of  other sites with fat layer exposed I10 Essential (primary) hypertension I48.0 Paroxysmal atrial fibrillation Quantity: 1 Electronic Signature(s) Signed: 10/29/2022 5:00:25 PM By: Lenda Kelp PA-C Previous Signature: 10/29/2022 4:35:50 PM Version By: Angelina Pih Entered By: Lenda Kelp on 10/29/2022 17:00:24

## 2022-10-29 NOTE — Progress Notes (Addendum)
RAYLEY, CASO ([id number]) [id number] Page 1 of 8 Visit Report for 10/29/2022 Arrival Information Details Patient Name: Date of Service: [name] Royals, DO RCA S 10/29/2022 3:00 PM Medical Record Number: [id number] Patient Account Number: [id number] Date of Birth/Sex: Treating RN: 25-Dec-1925 (89 y.o. [name] Primary Care Cydni [name]: Frazier Richards Other Clinician: Referring Deveron Shamoon: Treating [name]/Extender: [name] in Treatment: 15 Visit Information History Since Last Visit Added or deleted any medications: No Patient Arrived: Wheel Chair Any new allergies or adverse reactions: No Arrival Time: 15:18 Had a fall or experienced change in Yes Accompanied By: family activities of daily living that may affect Transfer Assistance: EasyPivot Patient Lift risk of falls: Patient Identification Verified: Yes Hospitalized since last visit: Yes Secondary Verification Process Completed: Yes Has Dressing in Place as Prescribed: Yes Patient Requires Transmission-Based Precautions: No Pain Present Now: No Patient Has Alerts: Yes Patient Alerts: Patient on Blood Thinner NOT DIABETIC Eliquis Notes pt had fall Monday, son states she hit her head was taken to [facility] and got staples to head wound Electronic Signature(s) Signed: 10/29/2022 4:55:39 PM By: Levora Dredge Entered By: Levora Dredge on 10/29/2022 15:19:16 -------------------------------------------------------------------------------- Clinic Level of Care Assessment Details Patient Name: Date of Service: [name] MSEY, DO RCA S 10/29/2022 3:00 PM Medical Record Number: [id number] Patient Account Number: [id number] Date of Birth/Sex: Treating RN: [birth year] (89 y.o. [name] Primary Care Kainen Struckman: Frazier Richards Other Clinician: Referring Kieron Kantner: Treating Krisna Omar/Extender: [name] in Treatment: 15 Clinic Level of Care Assessment  Items TOOL 4 Quantity Score []  - 0 Use when only an EandM is performed on FOLLOW-UP visit ASSESSMENTS - Nursing Assessment / Reassessment X- 1 10 Reassessment of Co-morbidities (includes updates in patient status) X- 1 5 Reassessment of Adherence to Treatment Plan ASSESSMENTS - Wound and Skin A ssessment / Reassessment X - Simple Wound Assessment / Reassessment - one wound 1 5 []  - 0 Complex Wound Assessment / Reassessment - multiple wounds []  - 0 Dermatologic / Skin Assessment (not related to wound area) ASSESSMENTS - Focused Assessment []  - 0 Circumferential Edema Measurements - multi extremities []  - 0 Nutritional Assessment / Counseling / Intervention []  - 0 Lower Extremity Assessment (monofilament, tuning fork, pulses) []  - 0 Peripheral Arterial Disease Assessment (using hand held doppler) ASSESSMENTS - Ostomy and/or Continence Assessment and Care []  - 0 Incontinence Assessment and Management []  - 0 Ostomy Care Assessment and Management (repouching, etc.) [name], [name] ([id number]) [id number] Page 2 of 8 PROCESS - Coordination of Care X - Simple Patient / Family Education for ongoing care 1 15 []  - 0 Complex (extensive) Patient / Family Education for ongoing care X- 1 10 Staff obtains Programmer, systems, Records, T Results / Process Orders est []  - 0 Staff telephones HHA, Nursing Homes / Clarify orders / etc []  - 0 Routine Transfer to another Facility (non-emergent condition) []  - 0 Routine Hospital Admission (non-emergent condition) []  - 0 New Admissions / Biomedical engineer / Ordering NPWT Apligraf, etc. , []  - 0 Emergency Hospital Admission (emergent condition) X- 1 10 Simple Discharge Coordination []  - 0 Complex (extensive) Discharge Coordination PROCESS - Special Needs []  - 0 Pediatric / Minor Patient Management []  - 0 Isolation Patient Management []  - 0 Hearing / Language / Visual special needs []  - 0 Assessment of  Community assistance (transportation, D/C planning, etc.) []  - 0 Additional assistance / Altered mentation []  - 0 Support Surface(s) Assessment (bed, cushion, seat, etc.) INTERVENTIONS - Wound Cleansing / Measurement X -  Simple Wound Cleansing - one wound 1 5 []  - 0 Complex Wound Cleansing - multiple wounds X- 1 5 Wound Imaging (photographs - any number of wounds) []  - 0 Wound Tracing (instead of photographs) X- 1 5 Simple Wound Measurement - one wound []  - 0 Complex Wound Measurement - multiple wounds INTERVENTIONS - Wound Dressings X - Small Wound Dressing one or multiple wounds 1 10 []  - 0 Medium Wound Dressing one or multiple wounds []  - 0 Large Wound Dressing one or multiple wounds X- 1 5 Application of Medications - topical []  - 0 Application of Medications - injection INTERVENTIONS - Miscellaneous []  - 0 External ear exam []  - 0 Specimen Collection (cultures, biopsies, blood, body fluids, etc.) []  - 0 Specimen(s) / Culture(s) sent or taken to Lab for analysis []  - 0 Patient Transfer (multiple staff / Civil Service fast streamer / Similar devices) []  - 0 Simple Staple / Suture removal (25 or less) []  - 0 Complex Staple / Suture removal (26 or more) []  - 0 Hypo / Hyperglycemic Management ([name] monitor of Blood Glucose) []  - 0 Ankle / Brachial Index (ABI) - do not check if billed separately X- 1 5 Vital Signs Has the patient been seen at the hospital within the last three years: Yes Total Score: 90 Level Of Care: New/Established - Level 3 Electronic Signature(s) Signed: 10/29/2022 4:55:39 PM By: Norman Herrlich, Signed: 10/29/2022 4:55:39 PM By: Amelia Jo ([id number]) [id number] Page 3 of 8 Entered By: Levora Dredge on 10/29/2022 16:35:43 -------------------------------------------------------------------------------- Encounter Discharge Information Details Patient Name: Date of Service: [name] MSEY, DO RCA S 10/29/2022 3:00  PM Medical Record Number: [id number] Patient Account Number: [id number] Date of Birth/Sex: Treating RN: 1926/01/26 (89 y.o. [name] Primary Care Draken Farrior: Frazier Richards Other Clinician: Referring [name]: Treating [name]/Extender: [name] in Treatment: 15 Encounter Discharge Information Items Discharge Condition: Stable Ambulatory Status: Wheelchair Discharge Destination: [place] Telephoned: No Orders Sent: Yes Transportation: Private Auto Accompanied By: son Schedule Follow-up Appointment: Yes Clinical Summary of Care: Electronic Signature(s) Signed: 10/29/2022 4:39:45 PM By: Levora Dredge Entered By: Levora Dredge on 10/29/2022 16:39:45 -------------------------------------------------------------------------------- Lower Extremity Assessment Details Patient Name: Date of Service: [name] MSEY, DO RCA S 10/29/2022 3:00 PM Medical Record Number: [id number] Patient Account Number: [id number] Date of Birth/Sex: Treating RN: May 29, 1926 (89 y.o. [name] Primary Care Wilkes Potvin: Frazier Richards Other Clinician: Referring Clarence Cogswell: Treating Lonell Stamos/Extender: [name] in Treatment: 15 Vascular Assessment Pulses: Dorsalis Pedis Palpable: [Right:Yes] Posterior Tibial Palpable: [Right:Yes] Notes pt wears own compression sock Electronic Signature(s) Signed: 10/29/2022 4:55:39 PM By: Levora Dredge Entered By: Levora Dredge on 10/29/2022 15:26:22 -------------------------------------------------------------------------------- Multi Wound Chart Details Patient Name: Date of Service: [name] Bufford Spikes, DO RCA S 10/29/2022 3:00 PM Medical Record Number: [id number] Patient Account Number: [id number] Date of Birth/Sex: Treating RN: [birth year] (89 y.o. [name] Primary Care Arlyne Brandes: Frazier Richards Other Clinician: Referring Deklyn Gibbon: Treating Trustin Chapa/Extender: [name] in Treatment: 15 Vital Signs Height(in): Pulse(bpm): 66 Weight(lbs): 106 Blood Pressure(mmHg): 138/82 Body Mass Index(BMI): [name], [name] ([id number]) [id number] Page 4 of 8 Temperature(F): 97.8 Respiratory Rate(breaths/min): 18 [2R:Photos:] [N/A:N/A] Right, Anterior T Second oe N/A N/A Wound Location: Pressure Injury N/A N/A Wounding Event: Pressure Ulcer N/A N/A Primary Etiology: Hypertension, Dementia, Received N/A N/A Comorbid History: Chemotherapy 09/14/2022 N/A N/A Date Acquired: 9 N/A N/A Weeks of Treatment: Open N/A N/A Wound Status: Yes N/A N/A Wound Recurrence: 0.2x0.3x0.1 N/A N/A Measurements L x W x D (  cm) 0.047 N/A N/A A (cm) : rea 0.005 N/A N/A Volume (cm) : 83.40% N/A N/A % Reduction in A rea: 82.10% N/A N/A % Reduction in Volume: Category/Stage II N/A N/A Classification: Medium N/A N/A Exudate A mount: Serosanguineous N/A N/A Exudate Type: red, brown N/A N/A Exudate Color: None Present (0%) N/A N/A Granulation A mount: Large (67-100%) N/A N/A Necrotic A mount: Fat Layer (Subcutaneous Tissue): Yes N/A N/A Exposed Structures: Fascia: No Tendon: No Muscle: No Joint: No Bone: No Small (1-33%) N/A N/A Epithelialization: Treatment Notes Electronic Signature(s) Signed: 10/29/2022 4:35:00 PM By: Levora Dredge Entered By: Levora Dredge on 10/29/2022 16:34:59 -------------------------------------------------------------------------------- Multi-Disciplinary Care Plan Details Patient Name: Date of Service: [name] Royals, DO RCA S 10/29/2022 3:00 PM Medical Record Number: [id number] Patient Account Number: [id number] Date of Birth/Sex: Treating RN: Jul 06, 1926 (89 y.o. [name] Primary Care Brookelin Felber: Frazier Richards Other Clinician: Referring Dontreal Miera: Treating Amauris Debois/Extender: [name] in Treatment: 15 Active Inactive Abuse / Safety / Falls  / Self Care Management Nursing Diagnoses: History of Falls Impaired physical mobility Goals: Patient will not experience any injury related to falls Date Initiated: 10/29/2022 Target Resolution Date: 11/24/2022 Goal Status: Active Patient will remain injury free related to falls Date Initiated: 10/29/2022 Target Resolution Date: 11/24/2022 [name], [name] ([id number]) [phone].pdf Page 5 of 8 Goal Status: Active Patient/caregiver will demonstrate safe use of adaptive devices to increase mobility Date Initiated: 10/29/2022 Target Resolution Date: 11/24/2022 Goal Status: Active Interventions: Assess Activities of Daily Living upon admission and as needed Assess fall risk on admission and as needed Assess: immobility, friction, shearing, incontinence upon admission and as needed Assess impairment of mobility on admission and as needed per policy Notes: Wound/Skin Impairment Nursing Diagnoses: Impaired tissue integrity Knowledge deficit related to smoking impact on wound healing Knowledge deficit related to ulceration/compromised skin integrity Goals: Patient/caregiver will verbalize understanding of skin care regimen Date Initiated: 07/16/2022 Date Inactivated: 10/15/2022 Target Resolution Date: 08/13/2022 Goal Status: Unmet Unmet Reason: pt baseline confussed Ulcer/skin breakdown will have a volume reduction of 30% by week 4 Date Initiated: 07/16/2022 Date Inactivated: 08/13/2022 Target Resolution Date: 08/13/2022 Goal Status: Met Ulcer/skin breakdown will have a volume reduction of 50% by week 8 Date Initiated: 08/13/2022 Target Resolution Date: 09/10/2022 Goal Status: Active Interventions: Assess patient/caregiver ability to obtain necessary supplies Assess patient/caregiver ability to perform ulcer/skin care regimen upon admission and as needed Assess ulceration(s) every visit Provide education on ulcer and skin care Treatment Activities: Skin care regimen  initiated : 07/16/2022 Topical wound management initiated : 07/16/2022 Notes: Electronic Signature(s) Signed: 10/29/2022 4:38:25 PM By: Levora Dredge Previous Signature: 10/29/2022 4:36:16 PM Version By: Levora Dredge Entered By: Levora Dredge on 10/29/2022 16:38:25 -------------------------------------------------------------------------------- Pain Assessment Details Patient Name: Date of Service: [name] Bufford Spikes, DO RCA S 10/29/2022 3:00 PM Medical Record Number: [id number] Patient Account Number: [id number] Date of Birth/Sex: Treating RN: 28-Apr-1926 (89 y.o. [name] Primary Care Jamieon Lannen: Frazier Richards Other Clinician: Referring Izael Bessinger: Treating Jamice Carreno/Extender: [name] in Treatment: 15 Active Problems Location of Pain Severity and Description of Pain Patient Has Paino No Site Locations Rate the pain. [name], [name] ([id number]) [id number] Page 6 of 8 Rate the pain. Current Pain Level: 0 Pain Management and Medication Current Pain Management: Electronic Signature(s) Signed: 10/29/2022 4:55:39 PM By: Levora Dredge Entered By: Levora Dredge on 10/29/2022 15:19:39 -------------------------------------------------------------------------------- Patient/Caregiver Education Details Patient Name: Date of Service: [name] Royals, DO RCA S 4/4/2024andnbsp3:00 PM Medical Record Number: [id number] Patient Account Number: [id number] Date of  Birth/Gender: Treating RN: September 14, 1925 (89 y.o. [name] Primary Care Physician: Frazier Richards Other Clinician: Referring Physician: Treating Physician/Extender: [name] in Treatment: 15 Education Assessment Education Provided To: Patient Education Topics Provided Medication Safety: Handouts: Other: ok to stop antibiotic Methods: Explain/Verbal, Printed Responses: State content correctly Safety: Handouts: Safety Methods:  Explain/Verbal Responses: State content correctly Wound/Skin Impairment: Handouts: Caring for Your Ulcer Methods: Explain/Verbal Responses: State content correctly Electronic Signature(s) Signed: 10/29/2022 4:55:39 PM By: Levora Dredge Entered By: Levora Dredge on 10/29/2022 16:39:06 -------------------------------------------------------------------------------- Wound Assessment Details Patient Name: Date of Service: [name] Bufford Spikes, DO RCA S 10/29/2022 3:00 PM Medical Record Number: [id number] Patient Account Number: [id number] Date of Birth/Sex: Treating RN: 1925/12/29 (89 y.o. [name] [name], [name] ([id number]) [id number] Page 7 of 8 Primary Care [name]: Frazier Richards Other Clinician: Referring [name]: Treating Zaylon Bossier/Extender: [name] in Treatment: 15 Wound Status Wound Number: [id number] Primary Etiology: Pressure Ulcer Wound Location: Right, Anterior T Second oe Wound Status: Open Wounding Event: Pressure Injury Comorbid History: Hypertension, Dementia, Received Chemotherapy Date Acquired: 09/14/2022 Weeks Of Treatment: 9 Clustered Wound: No Photos Wound Measurements Length: (cm) 0.2 Width: (cm) 0.3 Depth: (cm) 0.1 Area: (cm) 0.047 Volume: (cm) 0.005 % Reduction in Area: 83.4% % Reduction in Volume: 82.1% Epithelialization: Small (1-33%) Tunneling: No Undermining: No Wound Description Classification: Category/Stage II Exudate Amount: Medium Exudate Type: Serosanguineous Exudate Color: red, brown Foul Odor After Cleansing: No Slough/Fibrino Yes Wound Bed Granulation Amount: None Present (0%) Exposed Structure Necrotic Amount: Large (67-100%) Fascia Exposed: No Necrotic Quality: Adherent Slough Fat Layer (Subcutaneous Tissue) Exposed: Yes Tendon Exposed: No Muscle Exposed: No Joint Exposed: No Bone Exposed: No Treatment Notes Wound #2R (Toe Second) Wound Laterality: Right,  Anterior Cleanser Soap and Water Discharge Instruction: Gently cleanse wound with antibacterial soap, rinse and pat dry prior to dressing wounds Peri-Wound Care Topical Primary Dressing Prisma 4.34 (in) Discharge Instruction: Moisten w/normal saline or sterile water; Cover wound as directed. Do not remove from wound bed. Xeroform-HBD 2x2 (in/in) Discharge Instruction: Apply Xeroform-HBD 2x2 (in/in) as directed Secondary Dressing Conforming Safety Harbor Roll-Small Discharge Instruction: Apply Conforming Stretch Guaze Bandage as directed Tubular Elastic Net Bandage, Size1, 30 (yd) Secured With [employer], [name] ([id number]) [id number] Page 8 of 8 Compression Stockings Add-Ons Electronic Signature(s) Signed: 10/29/2022 3:31:16 PM By: Levora Dredge Entered By: Levora Dredge on 10/29/2022 15:31:15 -------------------------------------------------------------------------------- Vitals Details Patient Name: Date of Service: [name] MSEY, DO RCA S 10/29/2022 3:00 PM Medical Record Number: [id number] Patient Account Number: [id number] Date of Birth/Sex: Treating RN: [birth year] (89 y.o. [name] Primary Care Aaleeyah Bias: Frazier Richards Other Clinician: Referring [name]: Treating Dakoda Bassette/Extender: [name] in Treatment: 15 Vital Signs Time Taken: 15:18 Temperature (F): 97.8 Weight (lbs): 106 Pulse (bpm): 66 Respiratory Rate (breaths/min): 18 Blood Pressure (mmHg): 138/82 Reference Range: 80 - 120 mg / dl Electronic Signature(s) Signed: 10/29/2022 4:55:39 PM By: Levora Dredge Entered By: Levora Dredge on 10/29/2022 15:19:32

## 2022-11-05 ENCOUNTER — Encounter: Payer: Medicare Other | Admitting: Physician Assistant

## 2022-11-05 DIAGNOSIS — I87332 Chronic venous hypertension (idiopathic) with ulcer and inflammation of left lower extremity: Secondary | ICD-10-CM | POA: Diagnosis not present

## 2022-11-05 NOTE — Progress Notes (Addendum)
[name], [name] ([id number]) [id number] Page 1 of 6 Visit Report for 11/05/2022 Chief Complaint Document Details Patient Name: Date of Service: [name], [name] 11/05/2022 2:30 PM Medical Record Number: [id number] Patient Account Number: [id number] Date of Birth/Sex: Treating RN: Oct 09, 1925 (89 y.o. [name] Primary Care Provider: [name] Other Clinician: Referring Provider: Treating Provider/Extender: [name] in Treatment: 16 Information Obtained from: Patient Chief Complaint Right 2nd toe pressure ulcer Electronic Signature([name]) Signed: 11/05/2022 2:36:09 PM By: Allen Derry PA-C Entered By: Allen Derry on 11/05/2022 14:36:09 -------------------------------------------------------------------------------- HPI Details Patient Name: Date of Service: [name], [name] 11/05/2022 2:30 PM Medical Record Number: [id number] Patient Account Number: [id number] Date of Birth/Sex: Treating RN: Jun 10, 1926 (89 y.o. [name] Primary Care Provider: [name] Other Clinician: Referring Provider: Treating Provider/Extender: [name] in Treatment: 16 History of Present Illness HPI Description: 07-16-2022 upon evaluation today patient presents for initial inspection here in our clinic concerning a wound on the left lateral lower extremity. This is an area that has been present since around May 27, 2022 according to what the patient tells me today. With that being said she does live in assisted living facility. She is seen with her son in the office today. As far as past medical history is concerned she does have a history of breast cancer in 2003 which she did obviously survive. She also had a right knee replacement. In 2009 she had an IVC filter and heart ablation due to atrial fibrillation. With regard to the remainder of her medical history again she is on anticoagulant therapy at  this point due to atrial fibrillation, she has hypertension, and chronic venous insufficiency based on what I am seeing currently. In regard to the wound she is currently on Keflex which should be completed on the 25th of this month. Other than that she has had various things applied to the wound including different creams but then at other times she also states that she has had nothing put on and been told to just leave it open to air. There is been a lot of confusion about the best treatment course with regard to her wound which is obviously not good. I discussed with her today that I think we can have the best plan going forward and that that will include keeping this covered but nonetheless it should help it to heal the most effectively as well. 12/29; left lower leg chronic venous insufficiency. She complains fairly bitterly about the tightness of the wrap. She was put on antibiotics last week which I believe is Keflex. She lives in an assisted living 07-31-2021 upon evaluation today patient appears to be doing well currently in regard to her wound. She is actually been tolerating the dressing changes this is measuring smaller and looking better. I think we are on the right track I [name] believe that the current compression wrap is helping significantly with her Ealing at this point. Overall I [name] not see any signs of infection locally nor systemically which is great news. No fevers, chills, nausea, vomiting, or diarrhea. 08-13-2022 upon evaluation today patient'[name] wound on her leg actually showing signs of erythema around the edges of the wound that she is also still having quite a bit of drainage all things considered. Fortunately I [name] not see any evidence of infection systemically though locally I definitely see some evidence here both with erythema, warmth, and drainage. She has been using the silver alginate dressing we  have also had an 3 layer compression wrap though she did not feel good felt very  tight at the top of the wrap which is the main concern she has today. 1/25; the patient'[name] wound is smaller today. She is on doxycycline. Culture result from last week showed MRSA but fortunately sensitive to doxycycline which she is taking. She is using TCA Sorbact under 3 layer compression. 08-27-2022 upon evaluation today patient appears to be doing well currently in regard to her left leg ulcer which does not appear to be doing poorly at all in fact this appears to be healing quite nicely. I am extremely pleased with where we stand I [name] not see any signs of active infection locally nor systemically which is great news. No fevers, chills, nausea, vomiting, or diarrhea. 09-03-2022 upon evaluation patient'[name] wound is actually showing signs of significant improvement I am actually very pleased with where we stand currently. I [name] not see any signs of active knee flexion at this time which is great news. No fevers, chills, nausea, vomiting, or diarrhea. 09-10-2022 upon evaluation today patient appears to be doing well currently in regard to her wounds. Both the toe as well as the leg appear to be showing signs of good improvement. Fortunately I [name] not see any evidence of active infection at this time which is great news. She does have her compression socks. 09-24-2022 upon evaluation today patient'[name] wound actually is showing signs of doing much worse than last time I saw her in fact it was pretty much healed we were just seeing her today to ensure that it was still continue to be. Unfortunately not only is open that actually seems to be open down to the joint. I am somewhat concerned about how quickly this went downhill I am not sure if this is an indication of a severe infection that was hide he is in not apparent last time although this is the worst the wound has been it was never even close to this bad when I was seeing her in the past several weeks. [name], [name] ([id number])  [id number] Page 2 of 6 3/6This is a patient who came in last week with a opening over the proximal interphalangeal joint of the right second toe. Very painful. Her original wound was on the left side. Culture done this week showed pansensitive Proteus the patient is on doxycycline. An x-ray came from the facility there was no fracture small amount of osteoarthritis but no obvious bony abnormalities. 10-15-2022 upon evaluation today patient'[name] toe ulcer actually showed signs of improvement compared to last time I saw her. With that being said this is definitely not looking as good as what we previously saw when she was healed but is looking better than last time I saw her right before going out of town. I am going to perform some sharp debridement today however to clear away some of the necrotic debris and I discussed that with the patient and her son. 10-22-2022 upon evaluation today patient appears to be doing much better in regard to her wound the toe is actually showing signs of excellent improvement. I [name] believe that the [facility] has been a very good for her as far as getting this infection moving in the right direction. I [name] not see any signs of active infection systemically nor locally at this point. No fevers, chills, nausea, vomiting, or diarrhea. 10-29-2022 upon evaluation today patient appears to be doing well currently in regard to her wound which is actually getting  close to being completely resolved based on what I am seeing. I am very pleased with where things stand today. I [name] not see any evidence of active infection locally or systemically which is great news. 11-05-2022 upon evaluation today patient'[name] toe actually appears to be completely healed. Based on what I am seeing I [name] believe that she is actually making good progress here and in fact I think this is completely healed which is great news. I [name] not see any signs of active infection locally nor systemically  at this time which is great news and overall I [name] believe that we are moving in the right direction currently. Electronic Signature([name]) Signed: 11/05/2022 4:36:20 PM By: Allen Derry PA-C Entered By: Allen Derry on 11/05/2022 16:36:19 -------------------------------------------------------------------------------- Physical Exam Details Patient Name: Date of Service: [name], [name] 11/05/2022 2:30 PM Medical Record Number: [id number] Patient Account Number: [id number] Date of Birth/Sex: Treating RN: 1926/03/12 (89 y.o. [name] Primary Care Provider: [name] Other Clinician: Referring Provider: Treating Provider/Extender: [name] in Treatment: 16 Constitutional Well-nourished and well-hydrated in no acute distress. Respiratory normal breathing without difficulty. Psychiatric this patient is able to make decisions and demonstrates good insight into disease process. Alert and Oriented x 3. pleasant and cooperative. Notes Upon inspection patient'[name] wounds actually appear to be completely healed which is great news and overall very pleased with where we stand today. I [name] not see any signs of active infection locally nor systemically which is great news. No fevers, chills, nausea, vomiting, or diarrhea. Electronic Signature([name]) Signed: 11/05/2022 4:36:38 PM By: Allen Derry PA-C Entered By: Allen Derry on 11/05/2022 16:36:38 -------------------------------------------------------------------------------- Physician Orders Details Patient Name: Date of Service: [name], [name] 11/05/2022 2:30 PM Medical Record Number: [id number] Patient Account Number: [id number] Date of Birth/Sex: Treating RN: [birth year] (89 y.o. [name] Primary Care Provider: [name] Other Clinician: Referring Provider: Treating Provider/Extender: [name] in Treatment: [phone] Verbal / Phone Orders: No Diagnosis Coding ICD-10  Coding Code Description [phone] Chronic venous hypertension (idiopathic) with ulcer and inflammation of left lower extremity L98.492 Non-pressure chronic ulcer of skin of other sites with fat layer exposed I10 Essential (primary) hypertension I48.0 Paroxysmal atrial fibrillation Z79.01 Long term (current) use of anticoagulants QUANASIA, DEFINO ([id number]) [id number] Page 3 of 6 Discharge From [facility] Discharge from [facility] Treatment Complete - surgical shoes times 6 weeks then d/c , cover with bandaid times 2 weeks , change every other day Follow-up Appointments ppointment in 2 weeks. - surgical shoes times 6 weeks then d/c , cover with bandaid times 2 weeks , change every other day Return A Electronic Signature([name]) Signed: 11/05/2022 4:28:11 PM By: Yevonne Pax RN Signed: 11/05/2022 10:26:23 PM By: Allen Derry PA-C Entered By: Yevonne Pax on 11/05/2022 14:45:07 -------------------------------------------------------------------------------- Problem List Details Patient Name: Date of Service: [name], [name] 11/05/2022 2:30 PM Medical Record Number: [id number] Patient Account Number: [id number] Date of Birth/Sex: Treating RN: Dec 19, 1925 (89 y.o. [name] Primary Care Provider: [name] Other Clinician: Referring Provider: Treating Provider/Extender: [name] in Treatment: 16 Active Problems ICD-10 Encounter Code Description Active Date MDM Diagnosis I87.332 Chronic venous hypertension (idiopathic) with ulcer and inflammation of left 07/16/2022 No Yes lower extremity L98.492 Non-pressure chronic ulcer of skin of other sites with fat layer exposed 10/22/2022 No Yes I10 Essential (primary) hypertension 07/16/2022 No Yes I48.0 Paroxysmal atrial fibrillation 07/16/2022 No Yes Z79.01 Long term (current) use  of anticoagulants 07/16/2022 No Yes Inactive Problems Resolved Problems ICD-10 Code  Description Active Date Resolved Date L97.822 Non-pressure chronic ulcer of other part of left lower leg with fat layer exposed 07/16/2022 07/16/2022 Electronic Signature([name]) Signed: 11/05/2022 2:36:05 PM By: Allen Derry PA-C Entered By: Allen Derry on 11/05/2022 14:36:04 -------------------------------------------------------------------------------- Progress Note Details Patient Name: Date of Service: [name], [name] 11/05/2022 2:30 PM Medical Record Number: [id number] Patient Account Number: [id number] [name], [name] ([id number]) [id number] Page 4 of 6 Date of Birth/Sex: Treating RN: 30-Jan-1926 (89 y.o. [name] Primary Care Provider: Other Clinician: [name] Referring Provider: Treating Provider/Extender: [name] in Treatment: 16 Subjective Chief Complaint Information obtained from Patient Right 2nd toe pressure ulcer History of Present Illness (HPI) 07-16-2022 upon evaluation today patient presents for initial inspection here in our clinic concerning a wound on the left lateral lower extremity. This is an area that has been present since around May 27, 2022 according to what the patient tells me today. With that being said she does live in assisted living facility. She is seen with her son in the office today. As far as past medical history is concerned she does have a history of breast cancer in 2003 which she did obviously survive. She also had a right knee replacement. In 2009 she had an IVC filter and heart ablation due to atrial fibrillation. With regard to the remainder of her medical history again she is on anticoagulant therapy at this point due to atrial fibrillation, she has hypertension, and chronic venous insufficiency based on what I am seeing currently. In regard to the wound she is currently on Keflex which should be completed on the 25th of this month. Other than that she has had various  things applied to the wound including different creams but then at other times she also states that she has had nothing put on and been told to just leave it open to air. There is been a lot of confusion about the best treatment course with regard to her wound which is obviously not good. I discussed with her today that I think we can have the best plan going forward and that that will include keeping this covered but nonetheless it should help it to heal the most effectively as well. 12/29; left lower leg chronic venous insufficiency. She complains fairly bitterly about the tightness of the wrap. She was put on antibiotics last week which I believe is Keflex. She lives in an assisted living 07-31-2021 upon evaluation today patient appears to be doing well currently in regard to her wound. She is actually been tolerating the dressing changes this is measuring smaller and looking better. I think we are on the right track I [name] believe that the current compression wrap is helping significantly with her Ealing at this point. Overall I [name] not see any signs of infection locally nor systemically which is great news. No fevers, chills, nausea, vomiting, or diarrhea. 08-13-2022 upon evaluation today patient'[name] wound on her leg actually showing signs of erythema around the edges of the wound that she is also still having quite a bit of drainage all things considered. Fortunately I [name] not see any evidence of infection systemically though locally I definitely see some evidence here both with erythema, warmth, and drainage. She has been using the silver alginate dressing we have also had an 3 layer compression wrap though she did not feel good felt very tight at the top of the wrap which  is the main concern she has today. 1/25; the patient'[name] wound is smaller today. She is on doxycycline. Culture result from last week showed MRSA but fortunately sensitive to doxycycline which she is taking. She is using TCA Sorbact  under 3 layer compression. 08-27-2022 upon evaluation today patient appears to be doing well currently in regard to her left leg ulcer which does not appear to be doing poorly at all in fact this appears to be healing quite nicely. I am extremely pleased with where we stand I [name] not see any signs of active infection locally nor systemically which is great news. No fevers, chills, nausea, vomiting, or diarrhea. 09-03-2022 upon evaluation patient'[name] wound is actually showing signs of significant improvement I am actually very pleased with where we stand currently. I [name] not see any signs of active knee flexion at this time which is great news. No fevers, chills, nausea, vomiting, or diarrhea. 09-10-2022 upon evaluation today patient appears to be doing well currently in regard to her wounds. Both the toe as well as the leg appear to be showing signs of good improvement. Fortunately I [name] not see any evidence of active infection at this time which is great news. She does have her compression socks. 09-24-2022 upon evaluation today patient'[name] wound actually is showing signs of doing much worse than last time I saw her in fact it was pretty much healed we were just seeing her today to ensure that it was still continue to be. Unfortunately not only is open that actually seems to be open down to the joint. I am somewhat concerned about how quickly this went downhill I am not sure if this is an indication of a severe infection that was hide he is in not apparent last time although this is the worst the wound has been it was never even close to this bad when I was seeing her in the past several weeks. 3/6This is a patient who came in last week with a opening over the proximal interphalangeal joint of the right second toe. Very painful. Her original wound was on the left side. Culture done this week showed pansensitive Proteus the patient is on doxycycline. An x-ray came from the facility there was no fracture  small amount of osteoarthritis but no obvious bony abnormalities. 10-15-2022 upon evaluation today patient'[name] toe ulcer actually showed signs of improvement compared to last time I saw her. With that being said this is definitely not looking as good as what we previously saw when she was healed but is looking better than last time I saw her right before going out of town. I am going to perform some sharp debridement today however to clear away some of the necrotic debris and I discussed that with the patient and her son. 10-22-2022 upon evaluation today patient appears to be doing much better in regard to her wound the toe is actually showing signs of excellent improvement. I [name] believe that the [facility] has been a very good for her as far as getting this infection moving in the right direction. I [name] not see any signs of active infection systemically nor locally at this point. No fevers, chills, nausea, vomiting, or diarrhea. 10-29-2022 upon evaluation today patient appears to be doing well currently in regard to her wound which is actually getting close to being completely resolved based on what I am seeing. I am very pleased with where things stand today. I [name] not see any evidence of active infection locally or systemically  which is great news. 11-05-2022 upon evaluation today patient'[name] toe actually appears to be completely healed. Based on what I am seeing I [name] believe that she is actually making good progress here and in fact I think this is completely healed which is great news. I [name] not see any signs of active infection locally nor systemically at this time which is great news and overall I [name] believe that we are moving in the right direction currently. Objective Constitutional Well-nourished and well-hydrated in no acute distress. Vitals Time Taken: 2:48 PM, Weight: 106 lbs, Temperature: 97.7 F, Pulse: 78 bpm, Respiratory Rate: 18 breaths/min, Blood Pressure: 136/80 mmHg. [name], [name]  ([id number]) [id number] Page 5 of 6 Respiratory normal breathing without difficulty. Psychiatric this patient is able to make decisions and demonstrates good insight into disease process. Alert and Oriented x 3. pleasant and cooperative. General Notes: Upon inspection patient'[name] wounds actually appear to be completely healed which is great news and overall very pleased with where we stand today. I [name] not see any signs of active infection locally nor systemically which is great news. No fevers, chills, nausea, vomiting, or diarrhea. Integumentary (Hair, Skin) Wound #2R status is Healed - Epithelialized. Original cause of wound was Pressure Injury. The date acquired was: 09/14/2022. The wound has been in treatment 10 weeks. The wound is located on the Right,Anterior T Second. The wound measures 0cm length x 0cm width x 0cm depth; 0cm^2 area and 0cm^3 volume. oe There is no tunneling or undermining noted. There is a none present amount of drainage noted. There is no granulation within the wound bed. There is no necrotic tissue within the wound bed. Assessment Active Problems ICD-10 Chronic venous hypertension (idiopathic) with ulcer and inflammation of left lower extremity Non-pressure chronic ulcer of skin of other sites with fat layer exposed Essential (primary) hypertension Paroxysmal atrial fibrillation Long term (current) use of anticoagulants Plan Discharge From [facility] Services: Discharge from Wound Care Center Treatment Complete - surgical shoes times 6 weeks then d/c , cover with bandaid times 2 weeks , change every other day Follow-up Appointments: Return Appointment in 2 weeks. - surgical shoes times 6 weeks then d/c , cover with bandaid times 2 weeks , change every other day 1. Based on what I am seeing I [name] believe that the patient is completely healed we will get a continue to protect the toe however. I did send orders for her to continue with the  offloading shoe for the time being. 2. I am going to recommend as well the patient should continue to monitor for any signs of infection or worsening. Based on what I am seeing I think that this is completely closed but nonetheless we need to keep a close eye on things to make sure it does not attempt to worsen overall. We will see patient back for reevaluation in 1 week here in the clinic. If anything worsens or changes patient will contact our office for additional recommendations. Electronic Signature([name]) Signed: 11/05/2022 4:37:12 PM By: Allen Derry PA-C Entered By: Allen Derry on 11/05/2022 16:37:11 -------------------------------------------------------------------------------- SuperBill Details Patient Name: Date of Service: [name], [name] 11/05/2022 Medical Record Number: [id number] Patient Account Number: [id number] Date of Birth/Sex: Treating RN: Mar 19, 1926 (89 y.o. [name] Primary Care Provider: [name] Other Clinician: Referring Provider: Treating Provider/Extender: [name] in Treatment: 16 Diagnosis Coding ICD-10 Codes Code Description [phone] Chronic venous hypertension (idiopathic) with ulcer and inflammation of left lower extremity L98.492 Non-pressure chronic ulcer  of skin of other sites with fat layer exposed I10 Essential (primary) hypertension I48.0 Paroxysmal atrial fibrillation [name], [name] ([id number]) [id number] Page 6 of 6 Z79.01 Long term (current) use of anticoagulants Facility Procedures : CPT4 Code: [id number] Description: [phone] - WOUND CARE VISIT-LEV 2 EST PT Modifier: Quantity: 1 Physician Procedures : CPT4 Code Description Modifier [id number] - WC PHYS LEVEL 3 - EST PT ICD-10 Diagnosis Description I87.332 Chronic venous hypertension (idiopathic) with ulcer and inflammation of left lower extremity L98.492 Non-pressure chronic ulcer of skin of  other sites with fat layer  exposed I10 Essential (primary) hypertension I48.0 Paroxysmal atrial fibrillation Quantity: 1 Electronic Signature([name]) Signed: 11/05/2022 4:50:22 PM By: Allen Derry PA-C Previous Signature: 11/05/2022 4:28:11 PM Version By: Yevonne Pax RN Entered By: Allen Derry on 11/05/2022 16:50:21

## 2022-11-05 NOTE — Progress Notes (Signed)
[name], [name] ([id number]) [id number] Page 1 of 7 Visit Report for 11/05/2022 Arrival Information Details Patient Name: Date of Service: [name] Bowl, DO RCA S 11/05/2022 2:30 PM Medical Record Number: [id number] Patient Account Number: [id number] Date of Birth/Sex: Treating RN: [name] (89 y.o. [name] Primary Care [name]: [name] Other Clinician: Referring [name]: Treating [name]/Extender: [name] in Treatment: 16 Visit Information History Since Last Visit Added or deleted any medications: No Patient Arrived: Wheel Chair Any new allergies or adverse reactions: No Arrival Time: 14:21 Had a fall or experienced change in No Accompanied By: son activities of daily living that [name] affect Transfer Assistance: None risk of falls: Patient Identification Verified: Yes Signs or symptoms of abuse/neglect since last visito No Secondary Verification Process Completed: Yes Hospitalized since last visit: No Patient Requires Transmission-Based Precautions: No Implantable device outside of the clinic excluding No Patient Has Alerts: Yes cellular tissue based products placed in the center Patient Alerts: Patient on Blood Thinner since last visit: NOT DIABETIC Has Dressing in Place as Prescribed: Yes Eliquis Pain Present Now: No Electronic Signature(s) Signed: 11/05/2022 4:28:11 PM By: [name] Pax RN Entered By: [name] on 11/05/2022 14:28:52 -------------------------------------------------------------------------------- Clinic Level of Care Assessment Details Patient Name: Date of Service: [name] Bowl, DO RCA S 11/05/2022 2:30 PM Medical Record Number: [id number] Patient Account Number: [id number] Date of Birth/Sex: Treating RN: [birth year] (89 y.o. [name] Primary Care [name]: [name] Other Clinician: Referring Xzavian Semmel: Treating Mayukha Symmonds/Extender: [name] in  Treatment: 16 Clinic Level of Care Assessment Items TOOL 4 Quantity Score X- 1 0 Use when only an EandM is performed on FOLLOW-UP visit ASSESSMENTS - Nursing Assessment / Reassessment X- 1 10 Reassessment of Co-morbidities (includes updates in patient status) X- 1 5 Reassessment of Adherence to Treatment Plan ASSESSMENTS - Wound and Skin A ssessment / Reassessment X - Simple Wound Assessment / Reassessment - one wound 1 5  - 0 Complex Wound Assessment / Reassessment - multiple wounds  - 0 Dermatologic / Skin Assessment (not related to wound area) ASSESSMENTS - Focused Assessment  - 0 Circumferential Edema Measurements - multi extremities  - 0 Nutritional Assessment / Counseling / Intervention  - 0 Lower Extremity Assessment (monofilament, tuning fork, pulses)  - 0 Peripheral Arterial Disease Assessment (using hand held doppler) ASSESSMENTS - Ostomy and/or Continence Assessment and Care  - 0 Incontinence Assessment and Management  - 0 Ostomy Care Assessment and Management (repouching, etc.) PROCESS - Coordination of Care X - Simple Patient / Family Education for ongoing care 1 15 [name], [name] ([id number]) [id number].pdf Page 2 of 7  - 0 Complex (extensive) Patient / Family Education for ongoing care  - 0 Staff obtains Chiropractor, Records, T Results / Process Orders est  - 0 Staff telephones HHA, Nursing Homes / Clarify orders / etc  - 0 Routine Transfer to another Facility (non-emergent condition)  - 0 Routine Hospital Admission (non-emergent condition)  - 0 New Admissions / Manufacturing engineer / Ordering NPWT Apligraf, etc. ,  - 0 Emergency Hospital Admission (emergent condition) X- 1 10 Simple Discharge Coordination  - 0 Complex (extensive) Discharge Coordination PROCESS - Special Needs  - 0 Pediatric / Minor Patient Management  - 0 Isolation Patient Management  - 0 Hearing / Language / Visual  special needs  - 0 Assessment of Community assistance (transportation, D/C planning, etc.)  - 0 Additional assistance / Altered mentation  - 0 Support Surface(s) Assessment (bed, cushion, seat, etc.) INTERVENTIONS -  Wound Cleansing / Measurement X - Simple Wound Cleansing - one wound 1 5 []  - 0 Complex Wound Cleansing - multiple wounds X- 1 5 Wound Imaging (photographs - any number of wounds) []  - 0 Wound Tracing (instead of photographs) X- 1 5 Simple Wound Measurement - one wound []  - 0 Complex Wound Measurement - multiple wounds INTERVENTIONS - Wound Dressings X - Small Wound Dressing one or multiple wounds 1 10 []  - 0 Medium Wound Dressing one or multiple wounds []  - 0 Large Wound Dressing one or multiple wounds []  - 0 Application of Medications - topical []  - 0 Application of Medications - injection INTERVENTIONS - Miscellaneous []  - 0 External ear exam []  - 0 Specimen Collection (cultures, biopsies, blood, body fluids, etc.) []  - 0 Specimen(s) / Culture(s) sent or taken to Lab for analysis []  - 0 Patient Transfer (multiple staff / Nurse, adultHoyer Lift / Similar devices) []  - 0 Simple Staple / Suture removal (25 or less) []  - 0 Complex Staple / Suture removal (26 or more) []  - 0 Hypo / Hyperglycemic Management (close monitor of Blood Glucose) []  - 0 Ankle / Brachial Index (ABI) - do not check if billed separately X- 1 5 Vital Signs Has the patient been seen at the hospital within the last three years: Yes Total Score: 75 Level Of Care: New/Established - Level 2 Electronic Signature(s) Signed: 11/05/2022 4:28:11 PM By: [name] PaxEpps, Carrie RN Entered By: [name] PaxEpps, Carrie on 11/05/2022 14:45:35 [name] MaplesAMSEY, [name] ([id number]) [id number] Page 3 of 7 -------------------------------------------------------------------------------- Encounter Discharge Information Details Patient Name: Date of Service: [name] BowlRA MSEY, DO RCA S 11/05/2022 2:30 PM Medical Record  Number: [id number] Patient Account Number: [id number] Date of Birth/Sex: Treating RN: [birth year][name] (89 y.o. [name] FinnerF) Epps, Carrie Primary Care Jathen Sudano: [name] CrowAnderson, Marshall Other Clinician: Referring Yoshio Seliga: Treating Anmol Fleck/Extender: [name] ShearsStone, Hoyt Anderson, Marshall Weeks in Treatment: 16 Encounter Discharge Information Items Discharge Condition: Stable Ambulatory Status: Wheelchair Discharge Destination: Home Transportation: Private Auto Accompanied By: son Schedule Follow-up Appointment: Yes Clinical Summary of Care: Electronic Signature(s) Signed: 11/05/2022 4:28:11 PM By: [name] PaxEpps, Carrie RN Entered By: [name] PaxEpps, Carrie on 11/05/2022 14:48:06 -------------------------------------------------------------------------------- Lower Extremity Assessment Details Patient Name: Date of Service: [name] BowlRA MSEY, DO RCA S 11/05/2022 2:30 PM Medical Record Number: [id number] Patient Account Number: [id number] Date of Birth/Sex: Treating RN: [birth year][name] (89 y.o. [name] FinnerF) Epps, Carrie Primary Care Annalei Friesz: [name] CrowAnderson, Marshall Other Clinician: Referring Nobie Alleyne: Treating Zaion Hreha/Extender: [name] ShearsStone, Hoyt Anderson, Marshall Weeks in Treatment: 16 Electronic Signature(s) Signed: 11/05/2022 4:28:11 PM By: [name] PaxEpps, Carrie RN Entered By: [name] PaxEpps, Carrie on 11/05/2022 14:41:59 -------------------------------------------------------------------------------- Multi Wound Chart Details Patient Name: Date of Service: [name] BowlA MSEY, DO RCA S 11/05/2022 2:30 PM Medical Record Number: [id number] Patient Account Number: [id number] Date of Birth/Sex: Treating RN: [birth year][name] (89 y.o. [name] FinnerF) Epps, Carrie Primary Care Marvalene Barrett: [name] CrowAnderson, Marshall Other Clinician: Referring Quinetta Shilling: Treating Glennice Marcos/Extender: [name] ShearsStone, Hoyt Anderson, Marshall Weeks in Treatment: 16 Vital Signs Height(in): Pulse(bpm): 78 Weight(lbs): 106 Blood Pressure(mmHg): 136/80 Body Mass Index(BMI): Temperature(F): 97.7 Respiratory Rate(breaths/min): 18 [2R:Photos: No Photos  Right, Anterior T Second Wound Location: Pressure Injury Wounding Event: Pressure Ulcer Primary Etiology: Hypertension, Dementia, Received Comorbid History: Chemotherapy 09/14/2022 Date Acquired: 10 Weeks of Treatment: Healed - Epithelialized  Wound Status: Yes Wound Recurrence:] [N/A:N/A oe N/A N/A N/A N/A N/A N/A N/A N/A] [name] MaplesRAMSEY, [name] ([id number]) [2R:0x0x0 Measurements L x W x D (cm) 0 A (cm) : rea 0 Volume (cm) : 100.00% % Reduction in A rea: 100.00% % Reduction in Volume: Category/Stage II Classification: None Present Exudate A mount: None Present (0%)  Granulation A mount: None Present (0%)  Necrotic A mount: Fascia: No Exposed Structures: Fat Layer (Subcutaneous Tissue): No Tendon: No Muscle: No Joint: No Bone: No None Epithelialization:] [N/A:N/A N/A N/A N/A N/A N/A N/A N/A N/A N/A N/A] Treatment Notes Electronic Signature(s) Signed: 11/05/2022 4:28:11 PM By: [name] Pax RN Entered By: [name] on 11/05/2022 14:42:04 -------------------------------------------------------------------------------- Multi-Disciplinary Care Plan Details Patient Name: Date of Service: [name] Bowl, DO RCA S 11/05/2022 2:30 PM Medical Record Number: [id number] Patient Account Number: [id number] Date of Birth/Sex: Treating RN: [name] (89 y.o. [name] Primary Care Jeffree Cazeau: [name] Other Clinician: Referring Emry Barbato: Treating Conroy Goracke/Extender: [name] in Treatment: 16 Active Inactive Abuse / Safety / Falls / Self Care Management Nursing Diagnoses: History of Falls Impaired physical mobility Goals: Patient will not experience any injury related to falls Date Initiated: 10/29/2022 Target Resolution Date: 11/24/2022 Goal Status: Active Patient will remain injury free related to falls Date Initiated: 10/29/2022 Date Inactivated: 11/05/2022 Target Resolution Date: 11/24/2022 Goal Status: Met Patient/caregiver will demonstrate safe use of adaptive devices to  increase mobility Date Initiated: 10/29/2022 Date Inactivated: 11/05/2022 Target Resolution Date: 11/24/2022 Goal Status: Met Interventions: Assess Activities of Daily Living upon admission and as needed Assess fall risk on admission and as needed Assess: immobility, friction, shearing, incontinence upon admission and as needed Assess impairment of mobility on admission and as needed per policy Notes: Electronic Signature(s) Signed: 11/05/2022 4:28:11 PM By: [name] Pax RN Entered By: [name] on 11/05/2022 14:47:04 -------------------------------------------------------------------------------- Pain Assessment Details Patient Name: Date of Service: [name] Iran Sizer, DO RCA S 11/05/2022 2:30 PM Medical Record Number: [id number] Patient Account Number: [id number] [name], [name] ([id number]) [id number] Page 5 of 7 Date of Birth/Sex: Treating RN: [birth year] [name] (89 y.o. [name] Primary Care Jax Kentner: Other Clinician: Einar [name] Referring Evelyn Moch: Treating Jaden Abreu/Extender: [name] in Treatment: 16 Active Problems Location of Pain Severity and Description of Pain Patient Has Paino No Site Locations Pain Management and Medication Current Pain Management: Electronic Signature(s) Signed: 11/05/2022 4:28:11 PM By: [name] Pax RN Entered By: [name] on 11/05/2022 14:29:16 -------------------------------------------------------------------------------- Patient/Caregiver Education Details Patient Name: Date of Service: [name] Bowl, DO RCA S 4/11/2024andnbsp2:30 PM Medical Record Number: [id number] Patient Account Number: [id number] Date of Birth/Gender: Treating RN: [name] [birth year], [name] (89 y.o. [name] Primary Care Physician: [name] Other Clinician: Referring Physician: Treating Physician/Extender: [name] in Treatment: 16 Education Assessment Education Provided  To: Patient Education Topics Provided Wound/Skin Impairment: Handouts: Caring for Your Ulcer Methods: Explain/Verbal Responses: State content correctly Electronic Signature(s) Signed: 11/05/2022 4:28:11 PM By: [name] Pax RN Entered By: [name] on 11/05/2022 14:47:23 -------------------------------------------------------------------------------- Wound Assessment Details Patient Name: Date of Service: [name] Iran Sizer, DO RCA S 11/05/2022 2:30 PM Medical Record Number: [id number] Patient Account Number: [id number] Date of Birth/Sex: Treating RN: [name] (89 y.o. [name] [name], [name] ([id number]) [id number] Page 6 of 7 Primary Care Tilley Faeth: [name] Other Clinician: Referring Alezandra Egli: Treating Yicel Shannon/Extender: [name] in Treatment: 16 Wound Status Wound Number: 2R Primary Etiology: Pressure Ulcer Wound Location: Right, Anterior T Second oe Wound Status: Healed - Epithelialized Wounding Event: Pressure Injury Comorbid History: Hypertension, Dementia, Received Chemotherapy Date Acquired: 09/14/2022 Weeks Of Treatment: 10 Clustered Wound: No Wound Measurements Length: (cm) Width: (cm) Depth: (cm) Area: (cm) Volume: (cm) 0 % Reduction in Area: 100% 0 % Reduction in Volume: 100% 0 Epithelialization: None 0 Tunneling: No 0 Undermining: No Wound Description Classification: Category/Stage II Exudate Amount: None  Present Foul Odor After Cleansing: No Slough/Fibrino No Wound Bed Granulation Amount: None Present (0%) Exposed Structure Necrotic Amount: None Present (0%) Fascia Exposed: No Fat Layer (Subcutaneous Tissue) Exposed: No Tendon Exposed: No Muscle Exposed: No Joint Exposed: No Bone Exposed: No Treatment Notes Wound #2R (Toe Second) Wound Laterality: Right, Anterior Cleanser Peri-Wound Care Topical Primary Dressing Secondary Dressing Secured With Compression Wrap Compression  Stockings Add-Ons Electronic Signature(s) Signed: 11/05/2022 4:28:11 PM By: [name] Pax RN Entered By: [name] on 11/05/2022 14:41:47 -------------------------------------------------------------------------------- Vitals Details Patient Name: Date of Service: [name] Bowl, DO RCA S 11/05/2022 2:30 PM Medical Record Number: [id number] Patient Account Number: [id number] Date of Birth/Sex: Treating RN: [birth year] (89 y.o. [name] Primary Care Rorie Delmore: [name] Other Clinician: Referring Kayann Maj: Treating Chamaine Stankus/Extender: [name] in Treatment: 16 Vital Signs Time Taken: 14:48 Temperature (F): 97.7 Weight (lbs): 106 Pulse (bpm): 78 Respiratory Rate (breaths/min): 18 [name], [name] ([id number]) [id number] Page 7 of 7 Blood Pressure (mmHg): 136/80 Reference Range: 80 - 120 mg / dl Electronic Signature(s) Signed: 11/05/2022 4:28:11 PM By: [name] Pax RN Entered By: [name] on 11/05/2022 14:29:09

## 2022-11-19 ENCOUNTER — Encounter: Payer: Medicare Other | Admitting: Physician Assistant

## 2022-11-19 DIAGNOSIS — I87332 Chronic venous hypertension (idiopathic) with ulcer and inflammation of left lower extremity: Secondary | ICD-10-CM | POA: Diagnosis not present

## 2022-11-20 NOTE — Progress Notes (Signed)
ROZANNA, CORMANY ([id number]) [id number] Page 1 of 5 Visit Report for 11/19/2022 Arrival Information Details Patient Name: Date of Service: [name] Bowl, DO RCA S 11/19/2022 1:15 PM Medical Record Number: [id number] Patient Account Number: [id number] Date of Birth/Sex: Treating RN: 1925/08/16 (89 y.o. Skip Mayer Primary Care [name]: [name] Other Clinician: Referring [name]: Treating [name]/Extender: [name] in Treatment: 18 Visit Information History Since Last Visit Added or deleted any medications: No Patient Arrived: Walker Has Dressing in Place as Prescribed: Yes Arrival Time: 13:05 Pain Present Now: No Accompanied By: son Transfer Assistance: None Patient Identification Verified: Yes Secondary Verification Process Completed: Yes Patient Requires Transmission-Based Precautions: No Patient Has Alerts: Yes Patient Alerts: Patient on Blood Thinner NOT DIABETIC Eliquis Electronic Signature(s) Signed: 11/19/2022 3:23:16 PM By: [name], BSN, RN, CWS, [name] RN, BSN Entered By: [name], BSN, RN, CWS, [name] on 11/19/2022 13:09:03 -------------------------------------------------------------------------------- Clinic Level of Care Assessment Details Patient Name: Date of Service: [name], DO RCA S 11/19/2022 1:15 PM Medical Record Number: [id number] Patient Account Number: [id number] Date of Birth/Sex: Treating RN: 31-Aug-1925 (89 y.o. Cathlean Cower, [name] Primary Care Xayne Brumbaugh: [name] Other Clinician: Referring Jenise Iannelli: Treating [name]/Extender: [name] in Treatment: 18 Clinic Level of Care Assessment Items TOOL 4 Quantity Score []  - 0 Use when only an EandM is performed on FOLLOW-UP visit ASSESSMENTS - Nursing Assessment / Reassessment X- 1 10 Reassessment of Co-morbidities (includes updates in patient status) X- 1 5 Reassessment of Adherence to Treatment Plan ASSESSMENTS - Wound  and Skin A ssessment / Reassessment X - Simple Wound Assessment / Reassessment - one wound 1 5 []  - 0 Complex Wound Assessment / Reassessment - multiple wounds []  - 0 Dermatologic / Skin Assessment (not related to wound area) ASSESSMENTS - Focused Assessment []  - 0 Circumferential Edema Measurements - multi extremities []  - 0 Nutritional Assessment / Counseling / Intervention []  - 0 Lower Extremity Assessment (monofilament, tuning fork, pulses) []  - 0 Peripheral Arterial Disease Assessment (using hand held doppler) ASSESSMENTS - Ostomy and/or Continence Assessment and Care []  - 0 Incontinence Assessment and Management []  - 0 Ostomy Care Assessment and Management (repouching, etc.) PROCESS - Coordination of Care X - Simple Patient / Family Education for ongoing care 1 15 [name], [name] ([id number]) [id number] Page 2 of 5 []  - 0 Complex (extensive) Patient / Family Education for ongoing care X- 1 10 Staff obtains Consents, Records, T Results / Process Orders est []  - 0 Staff telephones HHA, Nursing Homes / Clarify orders / etc []  - 0 Routine Transfer to another Facility (non-emergent condition) []  - 0 Routine Hospital Admission (non-emergent condition) []  - 0 New Admissions / Manufacturing engineer / Ordering NPWT Apligraf, etc. , []  - 0 Emergency Hospital Admission (emergent condition) X- 1 10 Simple Discharge Coordination []  - 0 Complex (extensive) Discharge Coordination PROCESS - Special Needs []  - 0 Pediatric / Minor Patient Management []  - 0 Isolation Patient Management []  - 0 Hearing / Language / Visual special needs []  - 0 Assessment of Community assistance (transportation, D/C planning, etc.) []  - 0 Additional assistance / Altered mentation []  - 0 Support Surface(s) Assessment (bed, cushion, seat, etc.) INTERVENTIONS - Wound Cleansing / Measurement []  - 0 Simple Wound Cleansing - one wound []  - 0 Complex Wound Cleansing -  multiple wounds X- 1 5 Wound Imaging (photographs - any number of wounds) []  - 0 Wound Tracing (instead of photographs) []  - 0 Simple Wound Measurement - one wound []  -  0 Complex Wound Measurement - multiple wounds INTERVENTIONS - Wound Dressings []  - 0 Small Wound Dressing one or multiple wounds []  - 0 Medium Wound Dressing one or multiple wounds []  - 0 Large Wound Dressing one or multiple wounds []  - 0 Application of Medications - topical []  - 0 Application of Medications - injection INTERVENTIONS - Miscellaneous []  - 0 External ear exam []  - 0 Specimen Collection (cultures, biopsies, blood, body fluids, etc.) []  - 0 Specimen(s) / Culture(s) sent or taken to Lab for analysis []  - 0 Patient Transfer (multiple staff / Nurse, adult / Similar devices) []  - 0 Simple Staple / Suture removal (25 or less) []  - 0 Complex Staple / Suture removal (26 or more) []  - 0 Hypo / Hyperglycemic Management (close monitor of Blood Glucose) []  - 0 Ankle / Brachial Index (ABI) - do not check if billed separately X- 1 5 Vital Signs Has the patient been seen at the hospital within the last three years: Yes Total Score: 65 Level Of Care: New/Established - Level 2 Electronic Signature(s) Signed: 11/19/2022 3:23:16 PM By: [name], BSN, RN, CWS, [name] RN, BSN Entered By: [name], BSN, RN, CWS, [name] on 11/19/2022 13:15:46 [name] ([id number]) [id number] Page 3 of 5 -------------------------------------------------------------------------------- Encounter Discharge Information Details Patient Name: Date of Service: [name] Bowl, DO RCA S 11/19/2022 1:15 PM Medical Record Number: [id number] Patient Account Number: [id number] Date of Birth/Sex: Treating RN: Oct 27, 1925 (89 y.o. Skip Mayer Primary Care Cayleb Jarnigan: [name] Other Clinician: Referring Lace Chenevert: Treating Sallie Maker/Extender: [name] in Treatment: 18 Encounter Discharge  Information Items Discharge Condition: Stable Ambulatory Status: Walker Discharge Destination: Skilled Nursing Facility Telephoned: No Orders Sent: Yes Transportation: Private Auto Accompanied By: son Schedule Follow-up Appointment: Yes Clinical Summary of Care: Electronic Signature(s) Signed: 11/19/2022 3:23:16 PM By: [name], BSN, RN, CWS, [name] RN, BSN Entered By: [name], BSN, RN, CWS, [name] on 11/19/2022 13:22:45 -------------------------------------------------------------------------------- Lower Extremity Assessment Details Patient Name: Date of Service: [name], DO RCA S 11/19/2022 1:15 PM Medical Record Number: [id number] Patient Account Number: [id number] Date of Birth/Sex: Treating RN: [birth year] (89 y.o. Skip Mayer Primary Care Cary Wilford: [name] Other Clinician: Referring Allante Whitmire: Treating Jvion Turgeon/Extender: [name] in Treatment: 18 Edema Assessment Assessed: Kyra Searles: No] [Right: No] Edema: [Left: N] [Right: o] Vascular Assessment Pulses: Dorsalis Pedis Palpable: [Right:Yes] Electronic Signature(s) Signed: 11/19/2022 3:23:16 PM By: [name], BSN, RN, CWS, [name] RN, BSN Entered By: [name], BSN, RN, CWS, [name] on 11/19/2022 13:12:35 -------------------------------------------------------------------------------- Multi Wound Chart Details Patient Name: Date of Service: RA Iran Sizer, DO RCA S 11/19/2022 1:15 PM Medical Record Number: [id number] Patient Account Number: [id number] Date of Birth/Sex: Treating RN: 04-Jul-1926 (89 y.o. Skip Mayer Primary Care Joeph Szatkowski: [name] Other Clinician: Referring Philander Ake: Treating Leeann Bady/Extender: [name] in Treatment: 18 Vital Signs Height(in): Pulse(bpm): 78 Weight(lbs): 106 Blood Pressure(mmHg): 147/73 Body Mass Index(BMI): Temperature(F): 97.5 Respiratory Rate(breaths/min): 16 [name], [name] ([id number]) [id number] Page 4 of  5 [Treatment Notes:Wound Assessments Treatment Notes] Electronic Signature(s) Signed: 11/19/2022 3:23:16 PM By: [name], BSN, RN, CWS, [name] RN, BSN Entered By: [name], BSN, RN, CWS, [name] on 11/19/2022 13:12:49 -------------------------------------------------------------------------------- Multi-Disciplinary Care Plan Details Patient Name: Date of Service: [name], DO RCA S 11/19/2022 1:15 PM Medical Record Number: [id number] Patient Account Number: [id number] Date of Birth/Sex: Treating RN: 08/07/1925 (89 y.o. Skip Mayer Primary Care Jamera Vanloan: [name] Other Clinician: Referring Diamond Martucci: Treating Caillou Minus/Extender: [name] in Treatment: 18 Active Inactive Electronic Signature(s) Signed:  11/19/2022 3:23:16 PM By: [name], BSN, RN, CWS, [name] RN, BSN Entered By: [name], BSN, RN, CWS, [name] on 11/19/2022 13:18:27 -------------------------------------------------------------------------------- Pain Assessment Details Patient Name: Date of Service: [name], DO RCA S 11/19/2022 1:15 PM Medical Record Number: [id number] Patient Account Number: [id number] Date of Birth/Sex: Treating RN: 25-Oct-1925 (89 y.o. Skip Mayer Primary Care Jeffrie Stander: [name] Other Clinician: Referring Annaclaire Walsworth: Treating Minoru Chap/Extender: [name] in Treatment: 18 Active Problems Location of Pain Severity and Description of Pain Patient Has Paino No Site Locations Pain Management and Medication Current Pain Management: Electronic Signature(s) Signed: 11/19/2022 3:23:16 PM By: [name], BSN, RN, CWS, [name] RN, BSN Entered By: [name], BSN, RN, CWS, [name] on 11/19/2022 13:09:31 [name] ([id number]) [id number] Page 5 of 5 -------------------------------------------------------------------------------- Patient/Caregiver Education Details Patient Name: Date of Service: [name] Bowl, DO RCA S 4/25/2024andnbsp1:15 PM Medical  Record Number: [id number] Patient Account Number: [id number] Date of Birth/Gender: Treating RN: 02-17-1926 (88 y.o. Skip Mayer Primary Care Physician: [name] Other Clinician: Referring Physician: Treating Physician/Extender: [name] in Treatment: 18 Education Assessment Education Provided To: Patient Education Topics Provided Venous: Handouts: Controlling Swelling with Compression Stockings Methods: Explain/Verbal Responses: See progress note Electronic Signature(s) Signed: 11/19/2022 3:23:16 PM By: [name], BSN, RN, CWS, [name] RN, BSN Entered By: [name], BSN, RN, CWS, [name] on 11/19/2022 13:21:57 -------------------------------------------------------------------------------- Vitals Details Patient Name: Date of Service: RA Iran Sizer, DO RCA S 11/19/2022 1:15 PM Medical Record Number: [id number] Patient Account Number: [id number] Date of Birth/Sex: Treating RN: 02/16/1926 (89 y.o. Cathlean Cower, [name] Primary Care Sharel Behne: [name] Other Clinician: Referring Jocilynn Grade: Treating Emillee Talsma/Extender: [name] in Treatment: 18 Vital Signs Time Taken: 13:09 Temperature (F): 97.5 Weight (lbs): 106 Pulse (bpm): 78 Respiratory Rate (breaths/min): 16 Blood Pressure (mmHg): 147/73 Reference Range: 80 - 120 mg / dl Electronic Signature(s) Signed: 11/19/2022 3:23:16 PM By: [name], BSN, RN, CWS, [name] RN, BSN Entered By: [name], BSN, RN, CWS, [name] on 11/19/2022 13:09:26

## 2022-11-20 NOTE — Progress Notes (Signed)
BROWNIE, GOCKEL ([id number]) [id number] Page 1 of 6 Visit Report for 11/19/2022 Chief Complaint Document Details Patient Name: Date of Service: [name] Bowl, DO RCA S 11/19/2022 1:15 PM Medical Record Number: [id number] Patient Account Number: [id number] Date of Birth/Sex: Treating RN: 12-May-1926 (89 y.o. Skip Mayer Primary Care Provider: [name] Other Clinician: Referring Provider: Treating Provider/Extender: [name] in Treatment: 18 Information Obtained from: Patient Chief Complaint Right 2nd toe pressure ulcer Electronic Signature(s) Signed: 11/19/2022 1:24:29 PM By: Allen Derry PA-C Entered By: Allen Derry on 11/19/2022 13:24:29 -------------------------------------------------------------------------------- HPI Details Patient Name: Date of Service: [name], DO RCA S 11/19/2022 1:15 PM Medical Record Number: [id number] Patient Account Number: [id number] Date of Birth/Sex: Treating RN: September 23, 1925 (89 y.o. Skip Mayer Primary Care Provider: [name] Other Clinician: Referring Provider: Treating Provider/Extender: [name] in Treatment: 18 History of Present Illness HPI Description: 07-16-2022 upon evaluation today patient presents for initial inspection here in our clinic concerning a wound on the left lateral lower extremity. This is an area that has been present since around May 27, 2022 according to what the patient tells me today. With that being said she does live in assisted living facility. She is seen with her son in the office today. As far as past medical history is concerned she does have a history of breast cancer in 2003 which she did obviously survive. She also had a right knee replacement. In 2009 she had an IVC filter and heart ablation due to atrial fibrillation. With regard to the remainder of her medical history again she is on anticoagulant therapy at this  point due to atrial fibrillation, she has hypertension, and chronic venous insufficiency based on what I am seeing currently. In regard to the wound she is currently on Keflex which should be completed on the 25th of this month. Other than that she has had various things applied to the wound including different creams but then at other times she also states that she has had nothing put on and been told to just leave it open to air. There is been a lot of confusion about the best treatment course with regard to her wound which is obviously not good. I discussed with her today that I think we can have the best plan going forward and that that will include keeping this covered but nonetheless it should help it to heal the most effectively as well. 12/29; left lower leg chronic venous insufficiency. She complains fairly bitterly about the tightness of the wrap. She was put on antibiotics last week which I believe is Keflex. She lives in an assisted living 07-31-2021 upon evaluation today patient appears to be doing well currently in regard to her wound. She is actually been tolerating the dressing changes this is measuring smaller and looking better. I think we are on the right track I do believe that the current compression wrap is helping significantly with her Ealing at this point. Overall I do not see any signs of infection locally nor systemically which is great news. No fevers, chills, nausea, vomiting, or diarrhea. 08-13-2022 upon evaluation today patient's wound on her leg actually showing signs of erythema around the edges of the wound that she is also still having quite a bit of drainage all things considered. Fortunately I do not see any evidence of infection systemically though locally I definitely see some evidence here both with erythema, warmth, and drainage. She has been using the silver alginate dressing we  have also had an 3 layer compression wrap though she did not feel good felt very tight  at the top of the wrap which is the main concern she has today. 1/25; the patient's wound is smaller today. She is on doxycycline. Culture result from last week showed MRSA but fortunately sensitive to doxycycline which she is taking. She is using TCA Sorbact under 3 layer compression. 08-27-2022 upon evaluation today patient appears to be doing well currently in regard to her left leg ulcer which does not appear to be doing poorly at all in fact this appears to be healing quite nicely. I am extremely pleased with where we stand I do not see any signs of active infection locally nor systemically which is great news. No fevers, chills, nausea, vomiting, or diarrhea. 09-03-2022 upon evaluation patient's wound is actually showing signs of significant improvement I am actually very pleased with where we stand currently. I do not see any signs of active knee flexion at this time which is great news. No fevers, chills, nausea, vomiting, or diarrhea. 09-10-2022 upon evaluation today patient appears to be doing well currently in regard to her wounds. Both the toe as well as the leg appear to be showing signs of good improvement. Fortunately I do not see any evidence of active infection at this time which is great news. She does have her compression socks. 09-24-2022 upon evaluation today patient's wound actually is showing signs of doing much worse than last time I saw her in fact it was pretty much healed we were just seeing her today to ensure that it was still continue to be. Unfortunately not only is open that actually seems to be open down to the joint. I am somewhat concerned about how quickly this went downhill I am not sure if this is an indication of a severe infection that was hide he is in not apparent last time although this is the worst the wound has been it was never even close to this bad when I was seeing her in the past several weeks. [name], [name] ([id number])  [id number] Page 2 of 6 3/6This is a patient who came in last week with a opening over the proximal interphalangeal joint of the right second toe. Very painful. Her original wound was on the left side. Culture done this week showed pansensitive Proteus the patient is on doxycycline. An x-ray came from the facility there was no fracture small amount of osteoarthritis but no obvious bony abnormalities. 10-15-2022 upon evaluation today patient's toe ulcer actually showed signs of improvement compared to last time I saw her. With that being said this is definitely not looking as good as what we previously saw when she was healed but is looking better than last time I saw her right before going out of town. I am going to perform some sharp debridement today however to clear away some of the necrotic debris and I discussed that with the patient and her son. 10-22-2022 upon evaluation today patient appears to be doing much better in regard to her wound the toe is actually showing signs of excellent improvement. I do believe that the [facility] has been a very good for her as far as getting this infection moving in the right direction. I do not see any signs of active infection systemically nor locally at this point. No fevers, chills, nausea, vomiting, or diarrhea. 10-29-2022 upon evaluation today patient appears to be doing well currently in regard to her wound which is actually getting  close to being completely resolved based on what I am seeing. I am very pleased with where things stand today. I do not see any evidence of active infection locally or systemically which is great news. 11-05-2022 upon evaluation today patient's toe actually appears to be completely healed. Based on what I am seeing I do believe that she is actually making good progress here and in fact I think this is completely healed which is great news. I do not see any signs of active infection locally nor systemically  at this time which is great news and overall I do believe that we are moving in the right direction currently. 11-19-2022 upon evaluation today patient appears to be doing well currently in regard to her toe which is still showing signs of complete healing. There does not appear to be any evidence of active infection locally nor systemically which is great news. No fevers, chills, nausea, vomiting, or diarrhea. Electronic Signature(s) Signed: 11/19/2022 2:02:35 PM By: Allen Derry PA-C Entered By: Allen Derry on 11/19/2022 14:02:35 -------------------------------------------------------------------------------- Physical Exam Details Patient Name: Date of Service: [name], DO RCA S 11/19/2022 1:15 PM Medical Record Number: [id number] Patient Account Number: [id number] Date of Birth/Sex: Treating RN: [birth year] (89 y.o. Skip Mayer Primary Care Provider: [name] Other Clinician: Referring Provider: Treating Provider/Extender: [name] in Treatment: 18 Constitutional Well-nourished and well-hydrated in no acute distress. Respiratory normal breathing without difficulty. Psychiatric this patient is able to make decisions and demonstrates good insight into disease process. Alert and Oriented x 3. pleasant and cooperative. Notes Upon inspection patient's wound bed actually showed signs of good granulation and epithelization at this point. Fortunately I do not see any signs of active infection locally nor systemically which is great news and I do believe her wound is still completely healed which is great news. Electronic Signature(s) Signed: 11/19/2022 2:02:58 PM By: Allen Derry PA-C Entered By: Allen Derry on 11/19/2022 14:02:57 -------------------------------------------------------------------------------- Physician Orders Details Patient Name: Date of Service: [name], DO RCA S 11/19/2022 1:15 PM Medical Record Number: [id number] Patient Account  Number: [id number] Date of Birth/Sex: Treating RN: 1925-08-21 (89 y.o. Skip Mayer Primary Care Provider: [name] Other Clinician: Referring Provider: Treating Provider/Extender: [name] in Treatment: 18 Verbal / Phone Orders: No Diagnosis Coding Discharge From [facility] Services Discharge from Wound Care Center Treatment Complete - Continue to wear surgical to keep from toe from rubbing. Edema Control - Lymphedema / Segmental Compressive Device / Other Patient to wear own compression stockings. Remove compression stockings every night before going to bed and put on every morning when getting up. - If patient can tolerate, please put her stockings on daily. ESTEPHANIE, HUBBS ([id number]) [id number] Page 3 of 6 Elevate legs to the level of the heart and pump ankles as often as possible Elevate leg(s) parallel to the floor when sitting. Electronic Signature(s) Signed: 11/19/2022 3:23:16 PM By: Elliot Gurney, BSN, RN, CWS, [name] RN, BSN Signed: 11/19/2022 5:47:58 PM By: Allen Derry PA-C Entered By: Elliot Gurney BSN, RN, CWS, [name] on 11/19/2022 13:17:16 -------------------------------------------------------------------------------- Problem List Details Patient Name: Date of Service: [name] Bowl, DO RCA S 11/19/2022 1:15 PM Medical Record Number: [id number] Patient Account Number: [id number] Date of Birth/Sex: Treating RN: 11-06-1925 (89 y.o. Skip Mayer Primary Care Provider: [name] Other Clinician: Referring Provider: Treating Provider/Extender: [name] in Treatment: 18 Active Problems ICD-10 Encounter Code Description Active Date MDM Diagnosis I87.332 Chronic venous hypertension (idiopathic) with ulcer and inflammation  of left 07/16/2022 No Yes lower extremity L98.492 Non-pressure chronic ulcer of skin of other sites with fat layer exposed 10/22/2022 No Yes I10 Essential (primary) hypertension  07/16/2022 No Yes I48.0 Paroxysmal atrial fibrillation 07/16/2022 No Yes Z79.01 Long term (current) use of anticoagulants 07/16/2022 No Yes Inactive Problems Resolved Problems ICD-10 Code Description Active Date Resolved Date L97.822 Non-pressure chronic ulcer of other part of left lower leg with fat layer exposed 07/16/2022 07/16/2022 Electronic Signature(s) Signed: 11/19/2022 1:24:22 PM By: Allen Derry PA-C Entered By: Allen Derry on 11/19/2022 13:24:22 -------------------------------------------------------------------------------- Progress Note Details Patient Name: Date of Service: [name], DO RCA S 11/19/2022 1:15 PM Medical Record Number: [id number] Patient Account Number: [id number] Date of Birth/Sex: Treating RN: 03-22-1926 (89 y.o. Skip Mayer Primary Care Provider: [name] Other Clinician: Referring Provider: Treating Provider/Extender: [name] in Treatment: [address] [place] ([id number]) [id number] Page 4 of 6 Subjective Chief Complaint Information obtained from Patient Right 2nd toe pressure ulcer History of Present Illness (HPI) 07-16-2022 upon evaluation today patient presents for initial inspection here in our clinic concerning a wound on the left lateral lower extremity. This is an area that has been present since around May 27, 2022 according to what the patient tells me today. With that being said she does live in assisted living facility. She is seen with her son in the office today. As far as past medical history is concerned she does have a history of breast cancer in 2003 which she did obviously survive. She also had a right knee replacement. In 2009 she had an IVC filter and heart ablation due to atrial fibrillation. With regard to the remainder of her medical history again she is on anticoagulant therapy at this point due to atrial fibrillation, she has hypertension, and chronic venous  insufficiency based on what I am seeing currently. In regard to the wound she is currently on Keflex which should be completed on the 25th of this month. Other than that she has had various things applied to the wound including different creams but then at other times she also states that she has had nothing put on and been told to just leave it open to air. There is been a lot of confusion about the best treatment course with regard to her wound which is obviously not good. I discussed with her today that I think we can have the best plan going forward and that that will include keeping this covered but nonetheless it should help it to heal the most effectively as well. 12/29; left lower leg chronic venous insufficiency. She complains fairly bitterly about the tightness of the wrap. She was put on antibiotics last week which I believe is Keflex. She lives in an assisted living 07-31-2021 upon evaluation today patient appears to be doing well currently in regard to her wound. She is actually been tolerating the dressing changes this is measuring smaller and looking better. I think we are on the right track I do believe that the current compression wrap is helping significantly with her Ealing at this point. Overall I do not see any signs of infection locally nor systemically which is great news. No fevers, chills, nausea, vomiting, or diarrhea. 08-13-2022 upon evaluation today patient's wound on her leg actually showing signs of erythema around the edges of the wound that she is also still having quite a bit of drainage all things considered. Fortunately I do not see any evidence of infection systemically though locally I definitely see  some evidence here both with erythema, warmth, and drainage. She has been using the silver alginate dressing we have also had an 3 layer compression wrap though she did not feel good felt very tight at the top of the wrap which is the main concern she has today. 1/25; the  patient's wound is smaller today. She is on doxycycline. Culture result from last week showed MRSA but fortunately sensitive to doxycycline which she is taking. She is using TCA Sorbact under 3 layer compression. 08-27-2022 upon evaluation today patient appears to be doing well currently in regard to her left leg ulcer which does not appear to be doing poorly at all in fact this appears to be healing quite nicely. I am extremely pleased with where we stand I do not see any signs of active infection locally nor systemically which is great news. No fevers, chills, nausea, vomiting, or diarrhea. 09-03-2022 upon evaluation patient's wound is actually showing signs of significant improvement I am actually very pleased with where we stand currently. I do not see any signs of active knee flexion at this time which is great news. No fevers, chills, nausea, vomiting, or diarrhea. 09-10-2022 upon evaluation today patient appears to be doing well currently in regard to her wounds. Both the toe as well as the leg appear to be showing signs of good improvement. Fortunately I do not see any evidence of active infection at this time which is great news. She does have her compression socks. 09-24-2022 upon evaluation today patient's wound actually is showing signs of doing much worse than last time I saw her in fact it was pretty much healed we were just seeing her today to ensure that it was still continue to be. Unfortunately not only is open that actually seems to be open down to the joint. I am somewhat concerned about how quickly this went downhill I am not sure if this is an indication of a severe infection that was hide he is in not apparent last time although this is the worst the wound has been it was never even close to this bad when I was seeing her in the past several weeks. 3/6This is a patient who came in last week with a opening over the proximal interphalangeal joint of the right second toe. Very painful. Her  original wound was on the left side. Culture done this week showed pansensitive Proteus the patient is on doxycycline. An x-ray came from the facility there was no fracture small amount of osteoarthritis but no obvious bony abnormalities. 10-15-2022 upon evaluation today patient's toe ulcer actually showed signs of improvement compared to last time I saw her. With that being said this is definitely not looking as good as what we previously saw when she was healed but is looking better than last time I saw her right before going out of town. I am going to perform some sharp debridement today however to clear away some of the necrotic debris and I discussed that with the patient and her son. 10-22-2022 upon evaluation today patient appears to be doing much better in regard to her wound the toe is actually showing signs of excellent improvement. I do believe that the [facility] has been a very good for her as far as getting this infection moving in the right direction. I do not see any signs of active infection systemically nor locally at this point. No fevers, chills, nausea, vomiting, or diarrhea. 10-29-2022 upon evaluation today patient appears to be doing well  currently in regard to her wound which is actually getting close to being completely resolved based on what I am seeing. I am very pleased with where things stand today. I do not see any evidence of active infection locally or systemically which is great news. 11-05-2022 upon evaluation today patient's toe actually appears to be completely healed. Based on what I am seeing I do believe that she is actually making good progress here and in fact I think this is completely healed which is great news. I do not see any signs of active infection locally nor systemically at this time which is great news and overall I do believe that we are moving in the right direction currently. 11-19-2022 upon evaluation today patient appears to be doing well currently in  regard to her toe which is still showing signs of complete healing. There does not appear to be any evidence of active infection locally nor systemically which is great news. No fevers, chills, nausea, vomiting, or diarrhea. Objective Constitutional Well-nourished and well-hydrated in no acute distress. Vitals Time Taken: 1:09 PM, Weight: 106 lbs, Temperature: 97.5 F, Pulse: 78 bpm, Respiratory Rate: 16 breaths/min, Blood Pressure: 147/73 mmHg. Respiratory [name], [name] ([id number]) [id number] Page 5 of 6 normal breathing without difficulty. Psychiatric this patient is able to make decisions and demonstrates good insight into disease process. Alert and Oriented x 3. pleasant and cooperative. General Notes: Upon inspection patient's wound bed actually showed signs of good granulation and epithelization at this point. Fortunately I do not see any signs of active infection locally nor systemically which is great news and I do believe her wound is still completely healed which is great news. Assessment Active Problems ICD-10 Chronic venous hypertension (idiopathic) with ulcer and inflammation of left lower extremity Non-pressure chronic ulcer of skin of other sites with fat layer exposed Essential (primary) hypertension Paroxysmal atrial fibrillation Long term (current) use of anticoagulants Plan Discharge From [facility] Services: Discharge from Wound Care Center Treatment Complete - Continue to wear surgical to keep from toe from rubbing. Edema Control - Lymphedema / Segmental Compressive Device / Other: Patient to wear own compression stockings. Remove compression stockings every night before going to bed and put on every morning when getting up. - If patient can tolerate, please put her stockings on daily. Elevate legs to the level of the heart and pump ankles as often as possible Elevate leg(s) parallel to the floor when sitting. 1. I would recommend that we have  the patient continue to monitor for any signs of infection or worsening obviously I do believe she is completely healed at this point which is great news. 2. I am good recommend as well that she should continue to use the postop shoe I think this is probably best to be an indefinite order. We will see her back for follow-up visit as needed. Electronic Signature(s) Signed: 11/19/2022 2:03:37 PM By: Allen Derry PA-C Entered By: Allen Derry on 11/19/2022 14:03:36 -------------------------------------------------------------------------------- SuperBill Details Patient Name: Date of Service: [name], DO RCA S 11/19/2022 Medical Record Number: [id number] Patient Account Number: [id number] Date of Birth/Sex: Treating RN: 25-Sep-1925 (89 y.o. Skip Mayer Primary Care Provider: [name] Other Clinician: Referring Provider: Treating Provider/Extender: [name] in Treatment: 18 Diagnosis Coding ICD-10 Codes Code Description [phone] Chronic venous hypertension (idiopathic) with ulcer and inflammation of left lower extremity L98.492 Non-pressure chronic ulcer of skin of other sites with fat layer exposed I10 Essential (primary) hypertension I48.0 Paroxysmal atrial fibrillation Z79.01 Long  term (current) use of anticoagulants Facility Procedures Physician Procedures : CPT4 Code Description Modifier [phone] [id number] - WC PHYS LEVEL 3 - EST PT ICD-10 Diagnosis Description I87.332 Chronic venous hypertension (idiopathic) with ulcer and inflammation of left lower extremity L98.492 Non-pressure chronic ulcer of skin of  other sites with fat layer exposed I10 Essential (primary) hypertension I48.0 Paroxysmal atrial fibrillation Quantity: 1 Electronic Signature(s) Signed: 11/19/2022 2:03:59 PM By: Allen Derry PA-C Entered By: Allen Derry on 11/19/2022 14:03:59

## 2023-03-07 IMAGING — US US THYROID
1 series · 13 of 25 positions shown · non-contrast
Comparison: None.

CLINICAL DATA: Right thyroid nodule on exam

EXAM:
THYROID ULTRASOUND
TECHNIQUE: Ultrasound examination of the thyroid gland and adjacent soft
tissues was performed.

[Series 1: us thyroid · 13 of 73 slices shown]
[im 1/73]
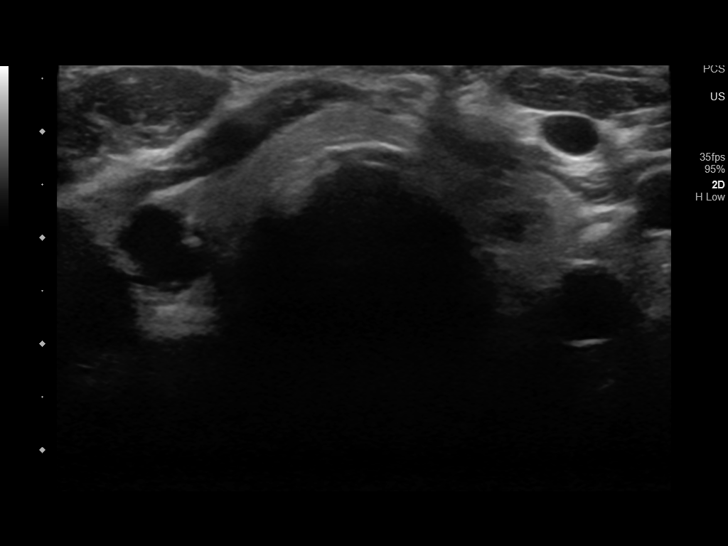
[im 7/73]
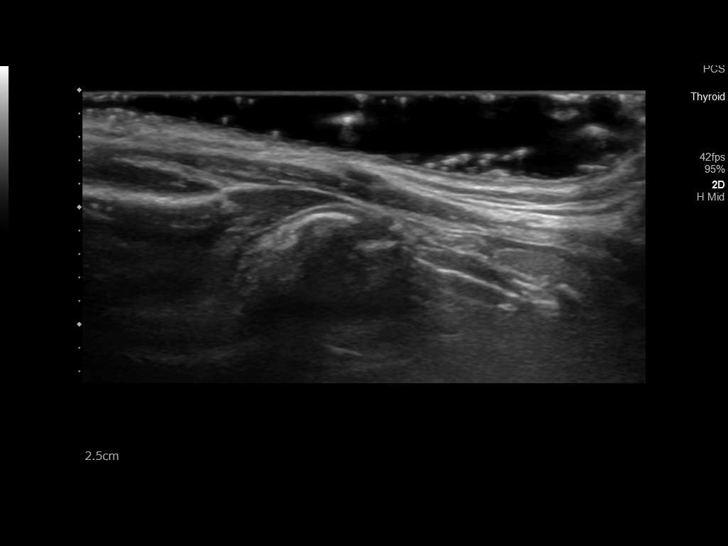
[im 13/73]
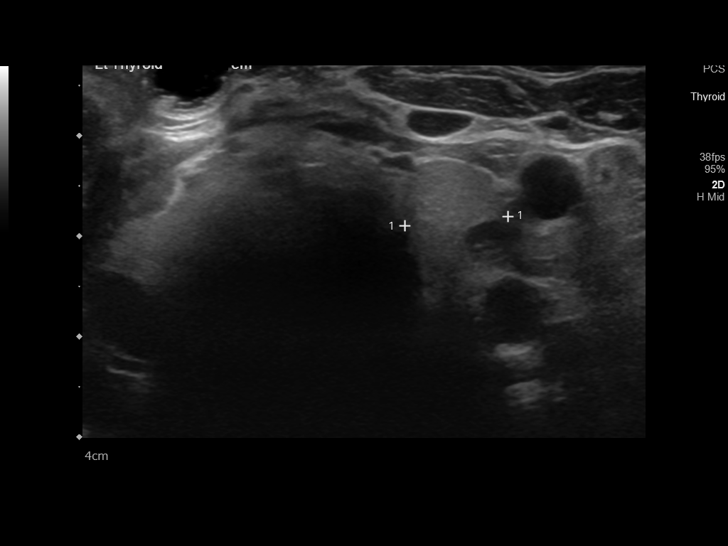
[im 19/73]
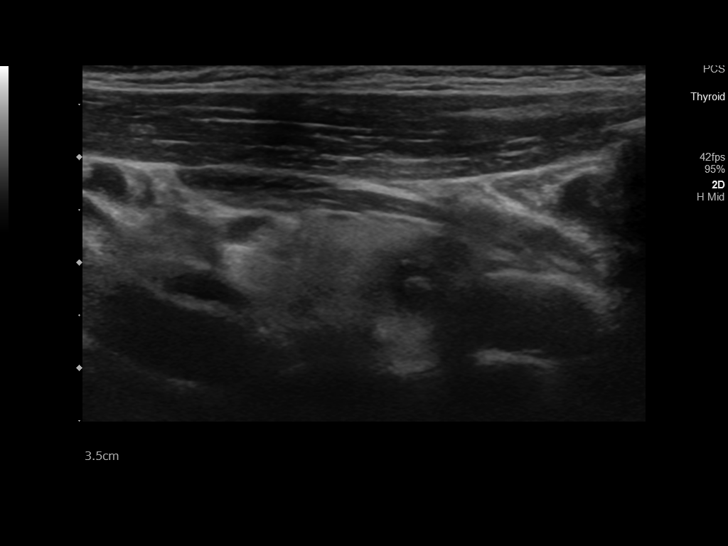
[im 25/73]
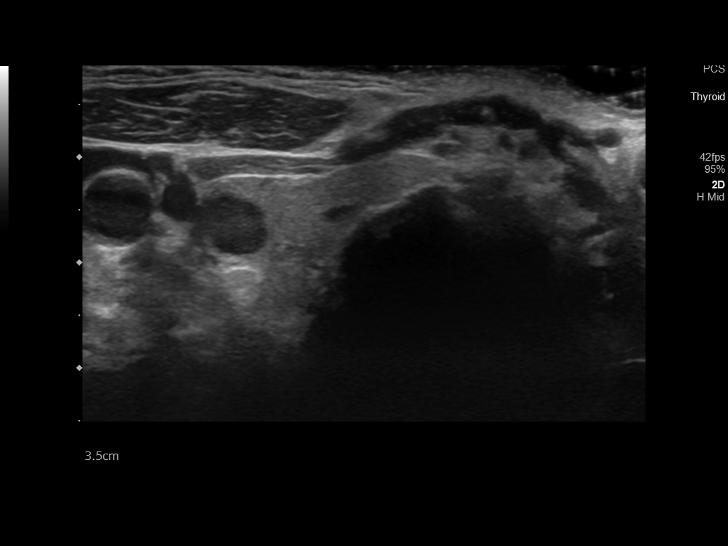
[im 31/73]
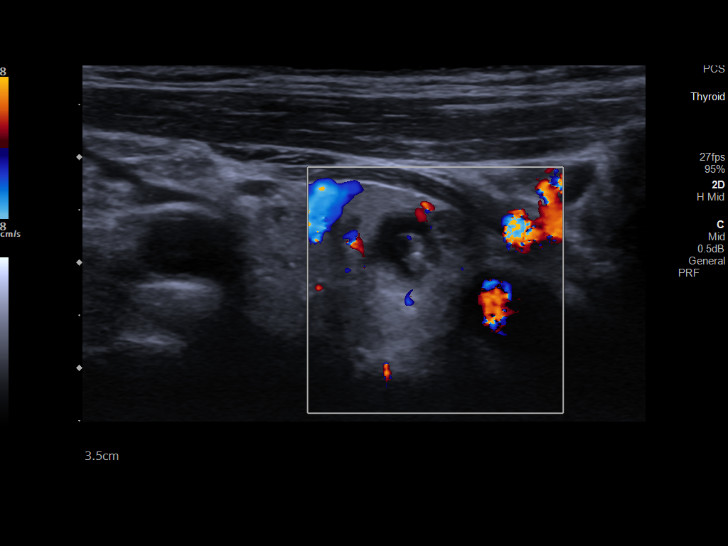
[im 37/73]
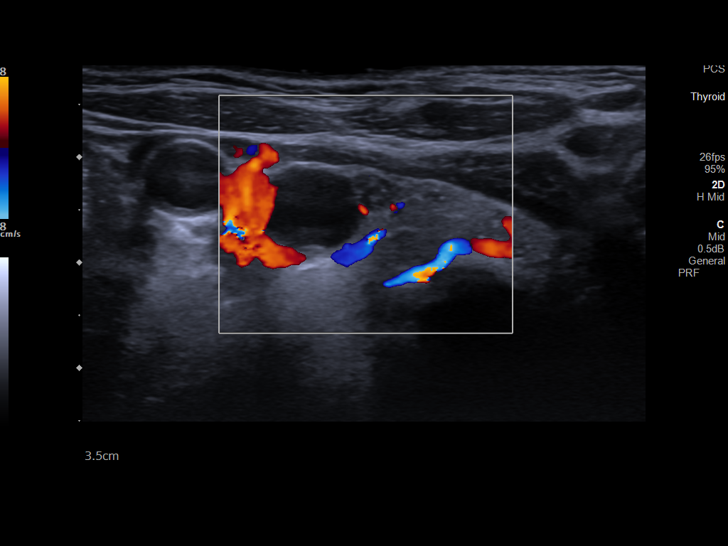
[im 43/73]
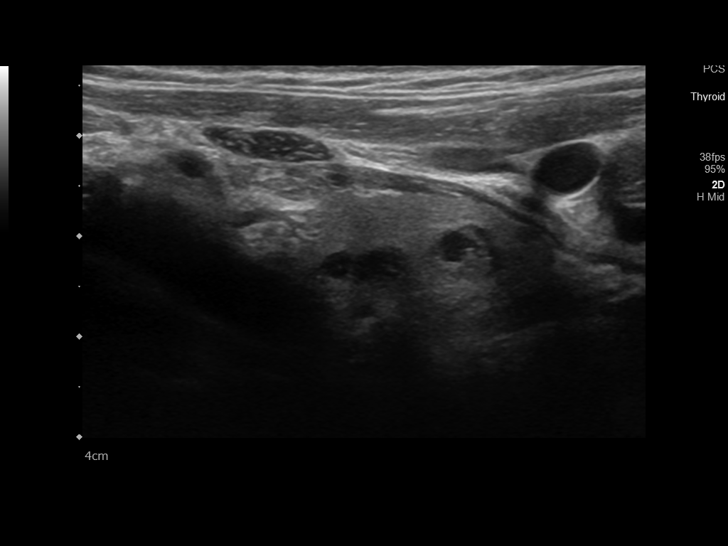
[im 49/73]
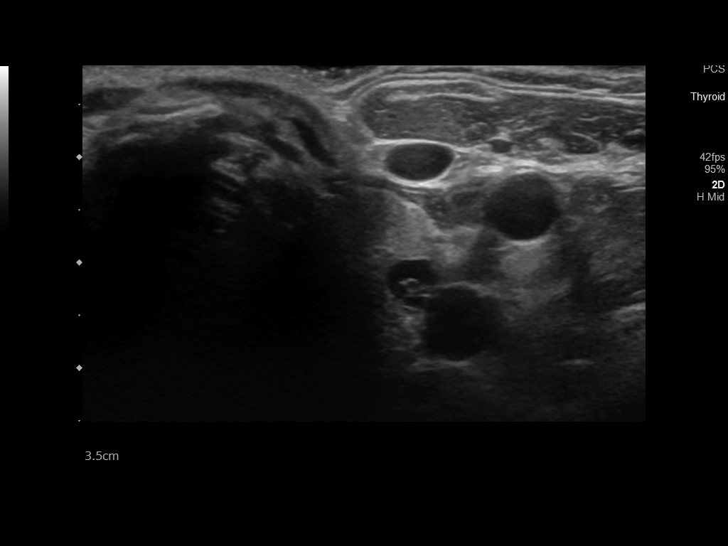
[im 55/73]
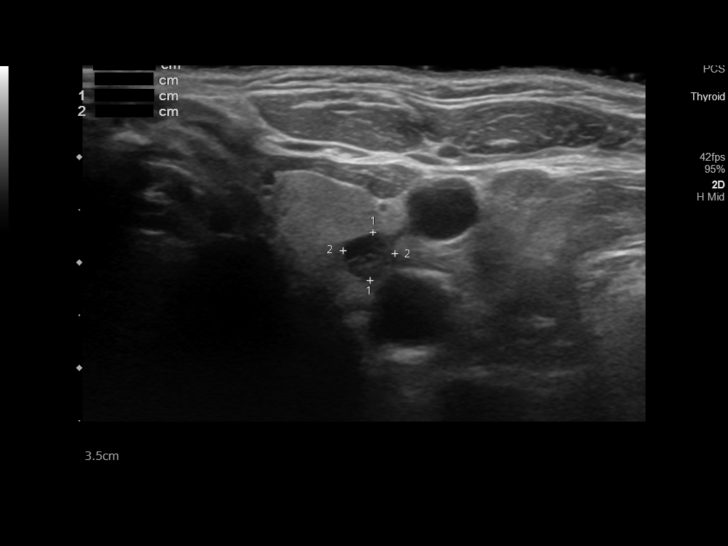
[im 61/73]
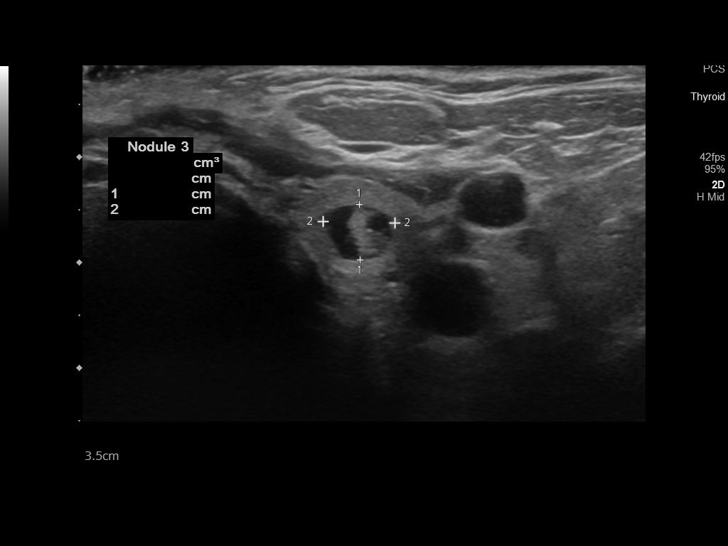
[im 67/73]
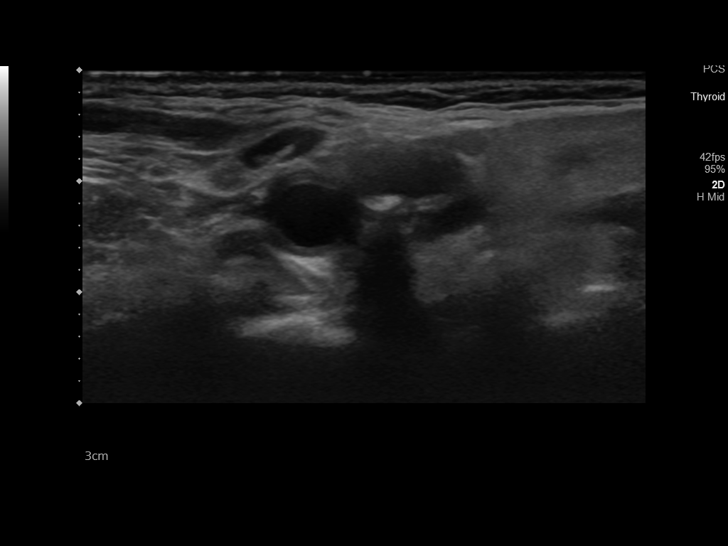
[im 73/73]
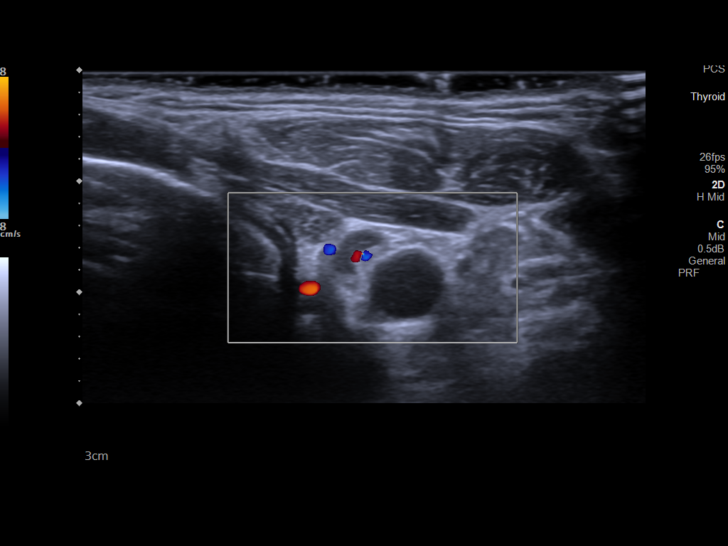

[13 of 25 positions shown; findings below may reference images not displayed]

FINDINGS: Parenchymal Echotexture: Mildly heterogeneous

Isthmus: 0.4 cm

Right lobe: 3.0 x 1.3 x 1.2 cm

Left lobe: 2.8 x 1.6 x 1.3 cm

_________________________________________________________

Estimated total number of nodules >/= 1 cm: 1

Number of spongiform nodules >/=  2 cm not described below (TR1): 0

Number of mixed cystic and solid nodules >/= 1.5 cm not described
below (TR2): 0

_________________________________________________________

Nodule 1: Predominantly cystic 1.5 x 1.0 x 0.9 cm nodule in the
inferior right thyroid lobe does not meet criteria for FNA or
imaging follow-up.

_________________________________________________________

Nodule 2: 0.9 x 0.5 x 0.5 cm mixed solid cystic nodule in the mid
left thyroid lobe does not meet criteria for FNA or imaging
follow-up.

_________________________________________________________

Nodule 3: 0.8 x 0.5 x 0.7 cm mixed solid cystic nodule in the
inferior left thyroid lobe does not meet criteria for FNA or imaging
follow-up.

_________________________________________________________

Nonenlarged bilateral neck lymph nodes are seen.
IMPRESSION: No suspicious thyroid nodules.

The above is in keeping with the ACR TI-RADS recommendations - [HOSPITAL] 8444;[DATE].

## 2023-12-04 ENCOUNTER — Emergency Department

## 2023-12-04 ENCOUNTER — Emergency Department
Admission: EM | Admit: 2023-12-04 | Discharge: 2023-12-05 | Disposition: A | Discharge status: Home / Self Care | Attending: Emergency Medicine | Admitting: Emergency Medicine

## 2023-12-04 ENCOUNTER — Other Ambulatory Visit: Payer: Self-pay

## 2023-12-04 DIAGNOSIS — Z7901 Long term (current) use of anticoagulants: Secondary | ICD-10-CM | POA: Insufficient documentation

## 2023-12-04 DIAGNOSIS — Z853 Personal history of malignant neoplasm of breast: Secondary | ICD-10-CM | POA: Diagnosis not present

## 2023-12-04 DIAGNOSIS — W19XXXA Unspecified fall, initial encounter: Secondary | ICD-10-CM | POA: Insufficient documentation

## 2023-12-04 DIAGNOSIS — S0990XA Unspecified injury of head, initial encounter: Secondary | ICD-10-CM | POA: Insufficient documentation

## 2023-12-04 DIAGNOSIS — I10 Essential (primary) hypertension: Secondary | ICD-10-CM | POA: Insufficient documentation

## 2023-12-04 NOTE — ED Triage Notes (Signed)
 Pt presenting to ED via EMS from [employer] [facility] [employer]. Pt reports she slid out of her recliner, most likely hit head. Was found on the floor by her recliner by staff at the facility. Pt denies pain. Denies LOC. +eliquis . AAOx4

## 2023-12-04 NOTE — ED Notes (Signed)
 Pt to CT at this time.

## 2023-12-04 NOTE — ED Provider Notes (Signed)
 [facility] Provider Note    Event Date/Time   First MD Initiated Contact with Patient 12/04/23 2331     (approximate)   History   Fall   HPI  [name] is a 89 y.o. female with history of hypertension, A-fib and PE on Eliquis , status post IVC filter, prior history of breast cancer, dementia who presents to the emergency department after she had a fall.  Patient coming from [facility] facility.  States she was trying to get out of her chair when she slid down onto the ground and hit her head.  No loss of consciousness.  Denies any complaints currently.  No preceding symptoms such as chest pain, shortness of breath, palpitations or dizziness that led to her fall.  She is being treated currently with Keflex  for lower extremity cellulitis.  No calf tenderness or calf swelling.  Has been ambulatory at her baseline.   History provided by patient, son.    Past Medical History:  Diagnosis Date   A-fib [facility])    Allergy    Breast cancer (HCC) 07/22/2012   T1c, Nx carcinoma left breast, ER 90%, PR 90%, HER-2/neu not over expressed.   Hypertension    Insomnia    Osteoporosis    Prolia  10/2012   PAT (paroxysmal atrial tachycardia) (HCC)    a. Dx 03/2019 - improved w/ beta blocker; b. 03/2019 Echo: EF 55-60%. Impaired relaxation. RVSP 48.26mmHg. Mild to mod MR/TR.   Pulmonary embolus (HCC) 2009   S/P IVC filter 2009    Past Surgical History:  Procedure Laterality Date   BLADDER SURGERY     BREAST SURGERY Left 2013   mastectomy   CHOLECYSTECTOMY     HERNIA REPAIR     MASTECTOMY Left 2013   BREAST CA   REPLACEMENT TOTAL KNEE     right   TONSILLECTOMY      MEDICATIONS:  Prior to Admission medications   Medication Sig Start Date End Date Taking? Authorizing Provider  albuterol  (PROVENTIL  HFA;VENTOLIN  HFA) 108 (90 Base) MCG/ACT inhaler Inhale 2 puffs into the lungs every 6 (six) hours as needed for wheezing or shortness of breath. 09/01/18   Ava Lei, FNP  ALPRAZolam  (XANAX ) 0.5 MG tablet Take 0.5 mg by mouth at bedtime as needed for anxiety.    [provider]  apixaban  (ELIQUIS ) 2.5 MG TABS tablet Take 2.5 mg by mouth 2 (two) times daily.    [provider]  baclofen  (LIORESAL ) 10 MG tablet Take 10 mg by mouth 3 (three) times daily.    [provider]  cephALEXin  (KEFLEX ) 250 MG capsule Take 1 capsule (250 mg total) by mouth 3 (three) times daily. 04/26/22   Sung, Jade J, MD  Cholecalciferol (VITAMIN D  PO) Take 2,000 mg by mouth daily.    [provider]  cholestyramine  light (PREVALITE ) 4 g packet  05/14/20   [provider]  denosumab  (PROLIA ) 60 MG/ML SOSY injection Prolia  60 mg/mL subcutaneous syringe  Inject 1 mL by subcutaneous route.    [provider]  diphenoxylate -atropine  (LOMOTIL ) 2.5-0.025 MG tablet Take 1 tablet by mouth 4 (four) times daily as needed for diarrhea or loose stools. 11/28/19   Kent Pear, MD  fluticasone  (FLONASE ) 50 MCG/ACT nasal spray Place 1 spray into both nostrils daily.    [provider]  loperamide (IMODIUM) 2 MG capsule Take 2 mg by mouth as needed for diarrhea or loose stools.    [provider]  losartan  (COZAAR ) 25 MG tablet TAKE 1 TABLET DAILY 09/30/20   Kent Pear, MD  metoprolol  tartrate (LOPRESSOR ) 50 MG tablet TAKE 1 TABLET TWICE A DAY 07/07/21   Kent Pear, MD  mirtazapine  (REMERON ) 7.5 MG tablet Take 1 tablet (7.5 mg total) by mouth at bedtime. 04/14/21   Kent Pear, MD  montelukast  (SINGULAIR ) 10 MG tablet  07/22/19   [provider]  triamcinolone  cream (KENALOG ) 0.1 % Apply 1 application topically 2 (two) times daily. For up to 7 days. Do not apply to the face. 04/19/19   Kent Pear, MD  Vibegron  (GEMTESA ) 75 MG TABS Take 75 mg by mouth daily. 07/27/21   Kent Pear, MD  XIIDRA  5 % SOLN Place 1-2 drops into both eyes daily.  12/06/17   [provider]     Physical Exam   Triage Vital Signs: ED Triage Vitals  Encounter Vitals Group     BP 12/04/23 2330 114/71     Systolic BP Percentile --      Diastolic BP Percentile --      Pulse Rate 12/04/23 2330 69     Resp 12/04/23 2330 18     Temp 12/04/23 2330 97.6 F (36.4 C)     Temp Source 12/04/23 2330 Oral     SpO2 12/04/23 2328 94 %     Weight 12/04/23 2330 114 lb 9.6 oz (52 kg)     Height 12/04/23 2330 5' (1.524 m)     Head Circumference --      Peak Flow --      Pain Score 12/04/23 2330 0     Pain Loc --      Pain Education --      Exclude from Growth Chart --     Most recent vital signs: Vitals:   12/04/23 2328 12/04/23 2330  BP:  114/71  Pulse:  69  Resp:  18  Temp:  97.6 F (36.4 C)  SpO2: 94% 99%     CONSTITUTIONAL: Alert, responds appropriately to questions. Well-appearing; well-nourished; GCS 15 HEAD: Normocephalic; atraumatic EYES: Conjunctivae clear, PERRL, EOMI ENT: normal nose; no rhinorrhea; moist mucous membranes; pharynx without lesions noted; no dental injury; no septal hematoma, no epistaxis; no facial deformity or bony tenderness NECK: Supple, no midline spinal tenderness, step-off or deformity; trachea midline CARD: RRR; S1 and S2 appreciated; no murmurs, no clicks, no rubs, no gallops RESP: Normal chest excursion without splinting or tachypnea; breath sounds clear and equal bilaterally; no wheezes, no rhonchi, no rales; no hypoxia or respiratory distress CHEST:  chest wall stable, no crepitus or ecchymosis or deformity, nontender to palpation; no flail chest ABD/GI: Non-distended; soft, non-tender, no rebound, no guarding; no ecchymosis or other lesions noted PELVIS:  stable, nontender to palpation BACK:  The back appears normal; no midline spinal tenderness, step-off or deformity EXT: Normal ROM in all joints; no deformity noted, mild edema in bilateral lower extremities without asymmetry, calf tenderness or calf swelling, mild redness and warmth  noted to bilateral anterior shins, compartments soft SKIN: Normal color for age and race; warm NEURO: No facial asymmetry, normal speech, moving all extremities equally  ED Results / Procedures / Treatments   LABS: (all labs ordered are listed, but only abnormal results are displayed) Labs Reviewed - No data to display   EKG:  EKG Interpretation Date/Time:  Saturday Dec 04 2023 23:33:33 EDT Ventricular Rate:  65 PR Interval:  180 QRS Duration:  99 QT Interval:  437 QTC Calculation: 455 R Axis:   29  Text Interpretation: Sinus rhythm Minimal ST elevation, inferior leads Confirmed by Verneda Golder [phone]) on 12/04/2023 11:46:20 PM          RADIOLOGY: My personal review and interpretation of imaging: CT head and cervical spine show no acute traumatic injury.  I have personally reviewed all radiology reports. No results found.    PROCEDURES:  Critical Care performed: No      .1-3 Lead EKG Interpretation  Performed by: Alexza Norbeck, Clover Dao, DO Authorized by: Jessilyn Catino, Clover Dao, DO     Interpretation: normal     ECG rate:  69   ECG rate assessment: normal     Rhythm: sinus rhythm     Ectopy: none     Conduction: normal       IMPRESSION / MDM / ASSESSMENT AND PLAN / ED COURSE  I reviewed the triage vital signs and the nursing notes.  Patient here after she slipped out of a chair onto her bottom and states she thinks she hit her head.  She is on blood thinners.  The patient is on the cardiac monitor to evaluate for evidence of arrhythmia and/or significant heart rate changes.   DIFFERENTIAL DIAGNOSIS (includes but not limited to):   Head injury on blood thinners, concussion, skull fracture, intracranial hemorrhage, cervical spine fracture  Patient's presentation is most consistent with acute presentation with potential threat to life or bodily function.  PLAN: Will obtain CT head and cervical spine.  EKG shows no arrhythmia, interval abnormality, ischemia.  She  has no acute complaints today.  States she just slid out of the chair when she was trying to stand.  This sounds mechanical in nature.   MEDICATIONS GIVEN IN ED: Medications - No data to display   ED COURSE: CTs reviewed and interpreted by myself and the radiologist and show no acute abnormality.  Family at bedside is comfortable taking her back to the nursing facility.  She does have signs of mild cellulitis in her lower extremities on exam but it appears she is already on Keflex .  She is afebrile, nontoxic here.  I feel she is safe to continue outpatient antibiotics for this.  Compartments are soft and extremities warm and well-perfused.  Low suspicion for arterial obstruction, DVT, septic arthritis, compartment syndrome.   At this time, I do not feel there is any life-threatening condition present. I reviewed all nursing notes, vitals, pertinent previous records.  All lab and urine results, EKGs, imaging ordered have been independently reviewed and interpreted by myself.  I reviewed all available radiology reports from any imaging ordered this visit.  Based on my assessment, I feel the patient is safe to be discharged home without further emergent workup and can continue workup as an outpatient as needed. Discussed all findings, treatment plan as well as usual and customary return precautions.  They verbalize understanding and are comfortable with this plan.  Outpatient follow-up has been provided as needed.  All questions have been answered.    CONSULTS:  none   OUTSIDE RECORDS REVIEWED: Reviewed last PCP note on 11/17/2023.       FINAL CLINICAL IMPRESSION(S) / ED DIAGNOSES   Final diagnoses:  Fall, initial encounter  Injury of head, initial encounter     Rx / DC Orders   ED Discharge Orders     None        Note:  This document was prepared using Dragon voice recognition software and may include  unintentional dictation errors.   Eldo Umanzor, Clover Dao, DO 12/06/23 [phone]

## 2023-12-05 NOTE — ED Notes (Signed)
 Pt's son, Hinton Luis, taking pt back to facility

## 2023-12-05 NOTE — Discharge Instructions (Addendum)
 CT head and cervical spine showed no injury.

## 2023-12-08 ENCOUNTER — Encounter: Attending: Physician Assistant | Admitting: Physician Assistant

## 2023-12-08 DIAGNOSIS — S81812A Laceration without foreign body, left lower leg, initial encounter: Secondary | ICD-10-CM | POA: Insufficient documentation

## 2023-12-08 DIAGNOSIS — L97812 Non-pressure chronic ulcer of other part of right lower leg with fat layer exposed: Secondary | ICD-10-CM | POA: Insufficient documentation

## 2023-12-08 DIAGNOSIS — F01A18 Vascular dementia, mild, with other behavioral disturbance: Secondary | ICD-10-CM | POA: Insufficient documentation

## 2023-12-08 DIAGNOSIS — I87332 Chronic venous hypertension (idiopathic) with ulcer and inflammation of left lower extremity: Secondary | ICD-10-CM | POA: Insufficient documentation

## 2023-12-08 DIAGNOSIS — Z7901 Long term (current) use of anticoagulants: Secondary | ICD-10-CM | POA: Diagnosis not present

## 2023-12-08 DIAGNOSIS — I480 Paroxysmal atrial fibrillation: Secondary | ICD-10-CM | POA: Insufficient documentation

## 2023-12-08 DIAGNOSIS — X58XXXA Exposure to other specified factors, initial encounter: Secondary | ICD-10-CM | POA: Insufficient documentation

## 2023-12-08 DIAGNOSIS — I10 Essential (primary) hypertension: Secondary | ICD-10-CM | POA: Diagnosis not present

## 2023-12-08 DIAGNOSIS — L97822 Non-pressure chronic ulcer of other part of left lower leg with fat layer exposed: Secondary | ICD-10-CM | POA: Insufficient documentation

## 2023-12-15 ENCOUNTER — Encounter: Admitting: Physician Assistant

## 2023-12-15 DIAGNOSIS — I87332 Chronic venous hypertension (idiopathic) with ulcer and inflammation of left lower extremity: Secondary | ICD-10-CM | POA: Diagnosis not present

## 2023-12-20 ENCOUNTER — Emergency Department
Admission: EM | Admit: 2023-12-20 | Discharge: 2023-12-21 | Disposition: A | Discharge status: Home / Self Care | Attending: Emergency Medicine | Admitting: Emergency Medicine

## 2023-12-20 ENCOUNTER — Emergency Department

## 2023-12-20 ENCOUNTER — Other Ambulatory Visit: Payer: Self-pay

## 2023-12-20 DIAGNOSIS — S0990XA Unspecified injury of head, initial encounter: Secondary | ICD-10-CM | POA: Diagnosis present

## 2023-12-20 DIAGNOSIS — I10 Essential (primary) hypertension: Secondary | ICD-10-CM | POA: Insufficient documentation

## 2023-12-20 DIAGNOSIS — Z7901 Long term (current) use of anticoagulants: Secondary | ICD-10-CM | POA: Diagnosis not present

## 2023-12-20 DIAGNOSIS — W19XXXA Unspecified fall, initial encounter: Secondary | ICD-10-CM

## 2023-12-20 DIAGNOSIS — W01198A Fall on same level from slipping, tripping and stumbling with subsequent striking against other object, initial encounter: Secondary | ICD-10-CM | POA: Insufficient documentation

## 2023-12-20 NOTE — ED Provider Notes (Signed)
 [facility] Provider Note   Event Date/Time   First MD Initiated Contact with Patient 12/20/23 2258     (approximate) History  Fall  HPI [name] is a 89 y.o. female with a past medical history of hypertension, atrial fibrillation on anticoagulation, osteoporosis, and cholecystectomy who presents from [facility] facility via EMS after reaching down for a piece of newspaper and falling forward striking her head on a carpeted floor.  Patient denies any loss of consciousness.  Patient denies any pain at this time. ROS: Patient currently denies any vision changes, tinnitus, difficulty speaking, facial droop, sore throat, chest pain, shortness of breath, abdominal pain, nausea/vomiting/diarrhea, dysuria, or weakness/numbness/paresthesias in any extremity   Physical Exam  Triage Vital Signs: ED Triage Vitals  Encounter Vitals Group     BP 12/20/23 2225 (!) 116/59     Systolic BP Percentile --      Diastolic BP Percentile --      Pulse Rate 12/20/23 2225 62     Resp 12/20/23 2225 17     Temp --      Temp src --      SpO2 12/20/23 2222 100 %     Weight 12/20/23 2225 112 lb 7 oz (51 kg)     Height 12/20/23 2225 5' (1.524 m)     Head Circumference --      Peak Flow --      Pain Score 12/20/23 2225 0     Pain Loc --      Pain Education --      Exclude from Growth Chart --    Most recent vital signs: Vitals:   12/20/23 2224 12/20/23 2225  BP:  (!) 116/59  Pulse:  62  Resp:  17  SpO2: 98% 97%   General: Awake, oriented x4. CV:  Good peripheral perfusion.  Resp:  Normal effort.  Abd:  No distention.  Other:  Elderly well-developed, well-nourished Caucasian female resting comfortably in no acute distress ED Results / Procedures / Treatments  Labs (all labs ordered are listed, but only abnormal results are displayed) Labs Reviewed - No data to display EKG ED ECG REPORT I, Charleen Conn, the attending physician, personally viewed and  interpreted this ECG. Date: 12/20/2023 EKG Time: 2224 Rate: 60 Rhythm: normal sinus rhythm QRS Axis: normal Intervals: normal ST/T Wave abnormalities: normal Narrative Interpretation: no evidence of acute ischemia RADIOLOGY ED MD interpretation: CT of the head without contrast interpreted by me shows no evidence of acute abnormalities including no intracerebral hemorrhage, obvious masses, or significant edema CT of the cervical spine interpreted by me does not show any evidence of acute abnormalities including no acute fracture, malalignment, height loss, or dislocation X-ray of the pelvis independently interpreted and shows no evidence of acute abnormalities -Agree with radiology assessment Official radiology report(s): DG Pelvis Portable Result Date: 12/20/2023 CLINICAL DATA:  Status post fall. EXAM: PORTABLE PELVIS 1-2 VIEWS COMPARISON:  October 27, 2022 FINDINGS: There is no evidence of an acute pelvic fracture or diastasis. Moderate severity degenerative changes seen involving both hips in the form of joint space narrowing and acetabular sclerosis. Marked severity degenerative changes are also seen within the visualized portion of the lower lumbar spine. No pelvic bone lesions are seen. A partially visualized inferior vena cava filter is present. Radiopaque surgical clips are seen within the right upper quadrant. IMPRESSION: 1. Degenerative changes without evidence of an acute osseous abnormality. Electronically Signed   By: Virgle Grime  M.D.   On: 12/20/2023 23:47   CT Head Wo Contrast Result Date: 12/20/2023 CLINICAL DATA:  Head trauma, minor (Age >= 65y); Neck trauma (Age >= 65y) . Patient states she was reaching down for a piece of newspaper and fell forward. Patient did hit her head. Patient denies LOC. Patient does take Eliquis . EXAM: CT HEAD WITHOUT CONTRAST CT CERVICAL SPINE WITHOUT CONTRAST TECHNIQUE: Multidetector CT imaging of the head and cervical spine was performed following  the standard protocol without intravenous contrast. Multiplanar CT image reconstructions of the cervical spine were also generated. RADIATION DOSE REDUCTION: This exam was performed according to the departmental dose-optimization program which includes automated exposure control, adjustment of the mA and/or kV according to patient size and/or use of iterative reconstruction technique. COMPARISON:  CT head and C-spine 10/27/2022 FINDINGS: CT HEAD FINDINGS Brain: Cerebral ventricle sizes are concordant with the degree of cerebral volume loss. Patchy and confluent areas of decreased attenuation are noted throughout the deep and periventricular white matter of the cerebral hemispheres bilaterally, compatible with chronic microvascular ischemic disease. No evidence of large-territorial acute infarction. No parenchymal hemorrhage. No mass lesion. No extra-axial collection. No mass effect or midline shift. No hydrocephalus. Basilar cisterns are patent. Vascular: No hyperdense vessel. Atherosclerotic calcifications are present within the cavernous internal carotid and vertebral arteries. Skull: No acute fracture or focal lesion. Sinuses/Orbits: Paranasal sinuses and mastoid air cells are clear. Bilateral lens replacement. Otherwise the orbits are unremarkable. Other: None. CT CERVICAL SPINE FINDINGS Alignment: Grade 1 anterolisthesis of C3 on C4, C4 on C5, C7 on T1. Skull base and vertebrae: Multilevel moderate severe degenerative changes of the spine. Severe left C4-C5 osseous neural foramina. No severe osseous central canal stenosis. No acute fracture. No aggressive appearing focal osseous lesion or focal pathologic process. Soft tissues and spinal canal: No prevertebral fluid or swelling. No visible canal hematoma. Upper chest: Unremarkable. Other: Atherosclerotic plaque. IMPRESSION: 1. No acute intracranial abnormality. 2. No acute displaced fracture or traumatic listhesis of the cervical spine. Electronically Signed    By: Morgane  Naveau M.D.   On: 12/20/2023 23:40   CT Cervical Spine Wo Contrast Result Date: 12/20/2023 CLINICAL DATA:  Head trauma, minor (Age >= 65y); Neck trauma (Age >= 65y) . Patient states she was reaching down for a piece of newspaper and fell forward. Patient did hit her head. Patient denies LOC. Patient does take Eliquis . EXAM: CT HEAD WITHOUT CONTRAST CT CERVICAL SPINE WITHOUT CONTRAST TECHNIQUE: Multidetector CT imaging of the head and cervical spine was performed following the standard protocol without intravenous contrast. Multiplanar CT image reconstructions of the cervical spine were also generated. RADIATION DOSE REDUCTION: This exam was performed according to the departmental dose-optimization program which includes automated exposure control, adjustment of the mA and/or kV according to patient size and/or use of iterative reconstruction technique. COMPARISON:  CT head and C-spine 10/27/2022 FINDINGS: CT HEAD FINDINGS Brain: Cerebral ventricle sizes are concordant with the degree of cerebral volume loss. Patchy and confluent areas of decreased attenuation are noted throughout the deep and periventricular white matter of the cerebral hemispheres bilaterally, compatible with chronic microvascular ischemic disease. No evidence of large-territorial acute infarction. No parenchymal hemorrhage. No mass lesion. No extra-axial collection. No mass effect or midline shift. No hydrocephalus. Basilar cisterns are patent. Vascular: No hyperdense vessel. Atherosclerotic calcifications are present within the cavernous internal carotid and vertebral arteries. Skull: No acute fracture or focal lesion. Sinuses/Orbits: Paranasal sinuses and mastoid air cells are clear. Bilateral lens replacement. Otherwise the orbits  are unremarkable. Other: None. CT CERVICAL SPINE FINDINGS Alignment: Grade 1 anterolisthesis of C3 on C4, C4 on C5, C7 on T1. Skull base and vertebrae: Multilevel moderate severe degenerative changes  of the spine. Severe left C4-C5 osseous neural foramina. No severe osseous central canal stenosis. No acute fracture. No aggressive appearing focal osseous lesion or focal pathologic process. Soft tissues and spinal canal: No prevertebral fluid or swelling. No visible canal hematoma. Upper chest: Unremarkable. Other: Atherosclerotic plaque. IMPRESSION: 1. No acute intracranial abnormality. 2. No acute displaced fracture or traumatic listhesis of the cervical spine. Electronically Signed   By: Morgane  Naveau M.D.   On: 12/20/2023 23:40   PROCEDURES: Critical Care performed: No Procedures MEDICATIONS ORDERED IN ED: Medications - No data to display IMPRESSION / MDM / ASSESSMENT AND PLAN / ED COURSE  I reviewed the triage vital signs and the nursing notes.                             The patient is on the cardiac monitor to evaluate for evidence of arrhythmia and/or significant heart rate changes. Patient's presentation is most consistent with acute presentation with potential threat to life or bodily function. Presenting after a fall that occurred just prior to arrival, resulting in injury to the head. The mechanism of injury was a mechanical ground level fall without syncope or near-syncope. The current level of pain is moderate. There was no loss of consciousness, confusion, seizure, or memory impairment. There is not a laceration associated with the injury. Denies neck pain. The patient does take blood thinner medications. Denies vomiting, numbness/weakness, fever CT of the head, neck, and x-ray of the pelvis did not show any evidence of acute abnormalities Dispo: Discharge with PCP follow-up     FINAL CLINICAL IMPRESSION(S) / ED DIAGNOSES   Final diagnoses:  Fall, initial encounter  Injury of head, initial encounter   Rx / DC Orders   ED Discharge Orders     None      Note:  This document was prepared using Dragon voice recognition software and may include unintentional  dictation errors.   Tobie Hellen K, MD 12/21/23 [phone]

## 2023-12-20 NOTE — ED Triage Notes (Signed)
 Patient to ED via [facility] from [facility]. Patient states she was reaching down for a piece of newspaper and fell forward. Patient did hit her head. Patient denies LOC. Patient does take Eliquis . Patient denies pain at this time. Patient has a wound on her bottom L leg. Patient has Hx of dementia. Patient A&Ox3 at this time.

## 2023-12-21 NOTE — ED Notes (Signed)
 Attempted to call [facility] at this time. Unsuccessful at this time.

## 2023-12-29 ENCOUNTER — Encounter: Attending: Physician Assistant | Admitting: Physician Assistant

## 2023-12-29 DIAGNOSIS — Z7901 Long term (current) use of anticoagulants: Secondary | ICD-10-CM | POA: Diagnosis not present

## 2023-12-29 DIAGNOSIS — L97812 Non-pressure chronic ulcer of other part of right lower leg with fat layer exposed: Secondary | ICD-10-CM | POA: Insufficient documentation

## 2023-12-29 DIAGNOSIS — I10 Essential (primary) hypertension: Secondary | ICD-10-CM | POA: Diagnosis not present

## 2023-12-29 DIAGNOSIS — I87332 Chronic venous hypertension (idiopathic) with ulcer and inflammation of left lower extremity: Secondary | ICD-10-CM | POA: Insufficient documentation

## 2023-12-29 DIAGNOSIS — F01A18 Vascular dementia, mild, with other behavioral disturbance: Secondary | ICD-10-CM | POA: Insufficient documentation

## 2023-12-29 DIAGNOSIS — I480 Paroxysmal atrial fibrillation: Secondary | ICD-10-CM | POA: Diagnosis not present

## 2023-12-29 DIAGNOSIS — S81812A Laceration without foreign body, left lower leg, initial encounter: Secondary | ICD-10-CM | POA: Insufficient documentation

## 2023-12-29 DIAGNOSIS — X58XXXA Exposure to other specified factors, initial encounter: Secondary | ICD-10-CM | POA: Insufficient documentation

## 2023-12-29 DIAGNOSIS — L97822 Non-pressure chronic ulcer of other part of left lower leg with fat layer exposed: Secondary | ICD-10-CM | POA: Diagnosis not present

## 2024-01-03 ENCOUNTER — Encounter: Admitting: Physician Assistant

## 2024-01-05 ENCOUNTER — Encounter: Admitting: Physician Assistant

## 2024-01-05 DIAGNOSIS — I87332 Chronic venous hypertension (idiopathic) with ulcer and inflammation of left lower extremity: Secondary | ICD-10-CM | POA: Diagnosis not present

## 2024-01-12 ENCOUNTER — Encounter: Admitting: Internal Medicine

## 2024-01-12 DIAGNOSIS — I87332 Chronic venous hypertension (idiopathic) with ulcer and inflammation of left lower extremity: Secondary | ICD-10-CM | POA: Diagnosis not present

## 2024-01-19 ENCOUNTER — Encounter: Payer: Self-pay | Admitting: Emergency Medicine

## 2024-01-19 ENCOUNTER — Emergency Department

## 2024-01-19 ENCOUNTER — Emergency Department
Admission: EM | Admit: 2024-01-19 | Discharge: 2024-01-19 | Disposition: A | Discharge status: Home / Self Care | Attending: Emergency Medicine | Admitting: Emergency Medicine

## 2024-01-19 DIAGNOSIS — Z7901 Long term (current) use of anticoagulants: Secondary | ICD-10-CM | POA: Insufficient documentation

## 2024-01-19 DIAGNOSIS — W19XXXA Unspecified fall, initial encounter: Secondary | ICD-10-CM

## 2024-01-19 DIAGNOSIS — S0101XA Laceration without foreign body of scalp, initial encounter: Secondary | ICD-10-CM | POA: Diagnosis not present

## 2024-01-19 DIAGNOSIS — W06XXXA Fall from bed, initial encounter: Secondary | ICD-10-CM | POA: Insufficient documentation

## 2024-01-19 DIAGNOSIS — Z23 Encounter for immunization: Secondary | ICD-10-CM | POA: Diagnosis not present

## 2024-01-19 DIAGNOSIS — S0990XA Unspecified injury of head, initial encounter: Secondary | ICD-10-CM | POA: Diagnosis present

## 2024-01-19 MED ORDER — TETANUS-DIPHTH-ACELL PERTUSSIS 5-2.5-18.5 LF-MCG/0.5 IM SUSY
0.5000 mL | PREFILLED_SYRINGE | Freq: Once | INTRAMUSCULAR | Status: AC
  Administered 2024-01-19: 0.5 mL via INTRAMUSCULAR
  Filled 2024-01-19: qty 0.5

## 2024-01-19 MED ORDER — LIDOCAINE HCL (PF) 1 % IJ SOLN
5.0000 mL | Freq: Once | INTRAMUSCULAR | Status: AC
  Administered 2024-01-19: 5 mL via INTRADERMAL
  Filled 2024-01-19: qty 5

## 2024-01-19 NOTE — ED Triage Notes (Addendum)
 Pt arrived via [facility] from [facility] where she has a unwitnessed fall hitting her head on bedside table. Approx 1 inch laceration to the anterior scalp with bandage in place and bleeding controlled. Pt takes Eliquis .    Hx/o Dementia

## 2024-01-19 NOTE — Discharge Instructions (Signed)
 She has 6 staples in her head.  These will need to be removed in 7 to 10 days which can be done with her primary care doctor, urgent care, or the ER.  No injury inside the head such as bleeding or fractures to the skull or any neck injury.  Please keep an eye out for any pus drainage.  Do not vigorously rub the area as this may cause dislodgment.

## 2024-01-19 NOTE — ED Notes (Signed)
 Head Wound stapled by md and cleaned.  D/c inst to son and grand daughter.  Pt alert

## 2024-01-19 NOTE — ED Provider Notes (Signed)
 [facility] Provider Note    Event Date/Time   First MD Initiated Contact with Patient 01/19/24 0015     (approximate)   History   Fall   HPI [name] is a 89 y.o. female with history of dementia presenting today for fall.  Patient brought in from her memory care facility where she had an unwitnessed fall while getting out of bed.  She reports she fell and hit her head on the bedside table.  Denies loss of consciousness.  Denies any pain in her neck or injury elsewhere.  No pain symptoms otherwise.  She is on Eliquis .  Denies any vision loss.     Physical Exam   Triage Vital Signs: ED Triage Vitals [01/19/24 0015]  Encounter Vitals Group     BP 132/62     Girls Systolic BP Percentile      Girls Diastolic BP Percentile      Boys Systolic BP Percentile      Boys Diastolic BP Percentile      Pulse Rate 65     Resp 16     Temp 97.8 F (36.6 C)     Temp Source Oral     SpO2 98 %     Weight      Height      Head Circumference      Peak Flow      Pain Score      Pain Loc      Pain Education      Exclude from Growth Chart     Most recent vital signs: Vitals:   01/19/24 0015  BP: 132/62  Pulse: 65  Resp: 16  Temp: 97.8 F (36.6 C)  SpO2: 98%   I have reviewed the vital signs. General:  Awake, alert, no acute distress. Head:  Normocephalic, 5 cm laceration to right parietal scalp. EENT:  PERRL, EOMI, Oral mucosa pink and moist, Neck is supple. Cardiovascular: Regular rate, 2+ distal pulses. Respiratory:  Normal respiratory effort, symmetrical expansion, no distress.   Extremities:  Moving all four extremities through full ROM without pain.   Neuro:  Alert and oriented.  Interacting appropriately.   Skin:  Warm, dry, no rash.   Psych: Appropriate affect.    ED Results / Procedures / Treatments   Labs (all labs ordered are listed, but only abnormal results are displayed) Labs Reviewed - No data to  display   EKG    RADIOLOGY Inability interpreted CT head and C-spine with no traumatic injuries   PROCEDURES:  Critical Care performed: No  .Laceration Repair  Date/Time: 01/19/2024 1:55 AM  Performed by: Malvina Alm DASEN, MD Authorized by: Malvina Alm DASEN, MD   Consent:    Consent obtained:  Verbal   Consent given by:  Patient   Risks, benefits, and alternatives were discussed: yes     Risks discussed:  Infection, pain, poor cosmetic result and poor wound healing   Alternatives discussed:  No treatment Universal protocol:    Patient identity confirmed:  Arm band Anesthesia:    Anesthesia method:  Local infiltration   Local anesthetic:  Lidocaine  1% w/o epi Laceration details:    Location:  Scalp   Scalp location:  R parietal   Length (cm):  5   Depth (mm):  3 Pre-procedure details:    Preparation:  Patient was prepped and draped in usual sterile fashion and imaging obtained to evaluate for foreign bodies Exploration:    Hemostasis achieved with:  Direct pressure   Wound exploration: wound explored through full range of motion and entire depth of wound visualized   Treatment:    Area cleansed with:  Chlorhexidine   Amount of cleaning:  Standard   Irrigation solution:  Sterile saline Skin repair:    Repair method:  Staples   Number of staples:  6 Approximation:    Approximation:  Close Repair type:    Repair type:  Simple Post-procedure details:    Dressing:  Open (no dressing)   Procedure completion:  Tolerated well, no immediate complications    MEDICATIONS ORDERED IN ED: Medications  lidocaine  (PF) (XYLOCAINE ) 1 % injection 5 mL (5 mLs Intradermal Given 01/19/24 0124)  Tdap (BOOSTRIX) injection 0.5 mL (0.5 mLs Intramuscular Given 01/19/24 0132)     IMPRESSION / MDM / ASSESSMENT AND PLAN / ED COURSE  I reviewed the triage vital signs and the nursing notes.                              Differential diagnosis includes, but is not limited to, ICH,  cervical spine injury, parietal scalp laceration  Patient's presentation is most consistent with acute complicated illness / injury requiring diagnostic workup.  Patient is a 89 year old female presenting today for fall from her bed with laceration to her right parietal scalp.  Is at her baseline mental status.  Denies any pain symptoms anywhere.  Nontender to palpation throughout her entire body.  Will get CT imaging of head and cervical spine.  CT imaging of head and neck negative for acute pathology.  Last tetanus shot was in 2012 and this was updated today.  Sick staples applied to the scalp but patient otherwise safe for discharge.  Instructed on follow-up procedure for staple removal and wound management. Clinical Course as of 01/19/24 0155  Wed Jan 19, 2024  0114 No traumatic pathology [DW]    Clinical Course User Index [DW] Malvina Alm DASEN, MD     FINAL CLINICAL IMPRESSION(S) / ED DIAGNOSES   Final diagnoses:  Laceration of scalp, initial encounter  Fall, initial encounter     Rx / DC Orders   ED Discharge Orders     None        Note:  This document was prepared using Dragon voice recognition software and may include unintentional dictation errors.   Malvina Alm DASEN, MD 01/19/24 [id number]

## 2024-01-19 NOTE — ED Notes (Signed)
 Pt to ct scan.

## 2024-01-25 ENCOUNTER — Encounter: Attending: Physician Assistant | Admitting: Physician Assistant

## 2024-01-25 DIAGNOSIS — L97812 Non-pressure chronic ulcer of other part of right lower leg with fat layer exposed: Secondary | ICD-10-CM | POA: Insufficient documentation

## 2024-01-25 DIAGNOSIS — I87332 Chronic venous hypertension (idiopathic) with ulcer and inflammation of left lower extremity: Secondary | ICD-10-CM | POA: Diagnosis present

## 2024-01-25 DIAGNOSIS — I480 Paroxysmal atrial fibrillation: Secondary | ICD-10-CM | POA: Insufficient documentation

## 2024-01-25 DIAGNOSIS — S81812A Laceration without foreign body, left lower leg, initial encounter: Secondary | ICD-10-CM | POA: Insufficient documentation

## 2024-01-25 DIAGNOSIS — L97822 Non-pressure chronic ulcer of other part of left lower leg with fat layer exposed: Secondary | ICD-10-CM | POA: Insufficient documentation

## 2024-01-25 DIAGNOSIS — I10 Essential (primary) hypertension: Secondary | ICD-10-CM | POA: Diagnosis not present

## 2024-01-25 DIAGNOSIS — Z7901 Long term (current) use of anticoagulants: Secondary | ICD-10-CM | POA: Insufficient documentation

## 2024-01-25 DIAGNOSIS — F01A18 Vascular dementia, mild, with other behavioral disturbance: Secondary | ICD-10-CM | POA: Diagnosis not present

## 2024-01-30 ENCOUNTER — Emergency Department (HOSPITAL_COMMUNITY)
Admission: EM | Admit: 2024-01-30 | Discharge: 2024-01-30 | Disposition: A | Discharge status: Home / Self Care | Attending: Student | Admitting: Student

## 2024-01-30 ENCOUNTER — Emergency Department (HOSPITAL_COMMUNITY)

## 2024-01-30 ENCOUNTER — Other Ambulatory Visit: Payer: Self-pay

## 2024-01-30 DIAGNOSIS — W01190A Fall on same level from slipping, tripping and stumbling with subsequent striking against furniture, initial encounter: Secondary | ICD-10-CM | POA: Insufficient documentation

## 2024-01-30 DIAGNOSIS — Z853 Personal history of malignant neoplasm of breast: Secondary | ICD-10-CM | POA: Diagnosis not present

## 2024-01-30 DIAGNOSIS — Z79899 Other long term (current) drug therapy: Secondary | ICD-10-CM | POA: Diagnosis not present

## 2024-01-30 DIAGNOSIS — I10 Essential (primary) hypertension: Secondary | ICD-10-CM | POA: Insufficient documentation

## 2024-01-30 DIAGNOSIS — F0390 Unspecified dementia without behavioral disturbance: Secondary | ICD-10-CM | POA: Diagnosis not present

## 2024-01-30 DIAGNOSIS — Z87891 Personal history of nicotine dependence: Secondary | ICD-10-CM | POA: Insufficient documentation

## 2024-01-30 DIAGNOSIS — R519 Headache, unspecified: Secondary | ICD-10-CM | POA: Diagnosis present

## 2024-01-30 DIAGNOSIS — S0101XA Laceration without foreign body of scalp, initial encounter: Secondary | ICD-10-CM | POA: Insufficient documentation

## 2024-01-30 DIAGNOSIS — Z7901 Long term (current) use of anticoagulants: Secondary | ICD-10-CM | POA: Diagnosis not present

## 2024-01-30 DIAGNOSIS — W19XXXA Unspecified fall, initial encounter: Secondary | ICD-10-CM

## 2024-01-30 DIAGNOSIS — Z8673 Personal history of transient ischemic attack (TIA), and cerebral infarction without residual deficits: Secondary | ICD-10-CM | POA: Diagnosis not present

## 2024-01-30 LAB — CBC WITH DIFFERENTIAL/PLATELET
Abs Immature Granulocytes: 0.01 K/uL (ref 0.00–0.07)
Basophils Absolute: 0.1 K/uL (ref 0.0–0.1)
Basophils Relative: 1 %
Eosinophils Absolute: 0.2 K/uL (ref 0.0–0.5)
Eosinophils Relative: 3 %
HCT: 30.8 % — ABNORMAL LOW (ref 36.0–46.0)
Hemoglobin: 9.6 g/dL — ABNORMAL LOW (ref 12.0–15.0)
Immature Granulocytes: 0 %
Lymphocytes Relative: 25 %
Lymphs Abs: 1.2 K/uL (ref 0.7–4.0)
MCH: 25.3 pg — ABNORMAL LOW (ref 26.0–34.0)
MCHC: 31.2 g/dL (ref 30.0–36.0)
MCV: 81.3 fL (ref 80.0–100.0)
Monocytes Absolute: 0.7 K/uL (ref 0.1–1.0)
Monocytes Relative: 13 %
Neutro Abs: 2.9 K/uL (ref 1.7–7.7)
Neutrophils Relative %: 58 %
Platelets: 309 K/uL (ref 150–400)
RBC: 3.79 MIL/uL — ABNORMAL LOW (ref 3.87–5.11)
RDW: 17.2 % — ABNORMAL HIGH (ref 11.5–15.5)
WBC: 5 K/uL (ref 4.0–10.5)
nRBC: 0 % (ref 0.0–0.2)

## 2024-01-30 LAB — COMPREHENSIVE METABOLIC PANEL WITH GFR
ALT: 12 U/L (ref 0–44)
AST: 18 U/L (ref 15–41)
Albumin: 3 g/dL — ABNORMAL LOW (ref 3.5–5.0)
Alkaline Phosphatase: 71 U/L (ref 38–126)
Anion gap: 12 (ref 5–15)
BUN: 24 mg/dL — ABNORMAL HIGH (ref 8–23)
CO2: 22 mmol/L (ref 22–32)
Calcium: 9 mg/dL (ref 8.9–10.3)
Chloride: 104 mmol/L (ref 98–111)
Creatinine, Ser: 1.07 mg/dL — ABNORMAL HIGH (ref 0.44–1.00)
GFR, Estimated: 48 mL/min — ABNORMAL LOW (ref >60)
Glucose, Bld: 88 mg/dL (ref 70–99)
Potassium: 4.1 mmol/L (ref 3.5–5.1)
Sodium: 138 mmol/L (ref 135–145)
Total Bilirubin: 0.4 mg/dL (ref 0.0–1.2)
Total Protein: 6.4 g/dL — ABNORMAL LOW (ref 6.5–8.1)

## 2024-01-30 NOTE — ED Provider Notes (Signed)
 [place] EMERGENCY DEPARTMENT AT [facility] Provider Note  CSN: [id number] Arrival date & time: 01/30/24 9486  Chief Complaint(s) Fall (Fall on [employer])  HPI [name] is a 89 y.o. female with PMH A-fib, PE on Eliquis , dementia who presents emergency room for evaluation of a fall.  Patient states that she got tired of sitting in her chair and looking at the window show she stood up and tripped on the carpet falling to the ground striking her head on the ground.  Arrives with complaints of posterior headache but denies chest pain, shortness of breath, abdominal pain, nausea, vomiting or other systemic symptoms.  Arrives as a level 2 trauma for fall blood thinners.   Past Medical History Past Medical History:  Diagnosis Date   A-fib [facility])    Allergy    Breast cancer (HCC) 07/22/2012   T1c, Nx carcinoma left breast, ER 90%, PR 90%, HER-2/neu not over expressed.   Hypertension    Insomnia    Osteoporosis    Prolia  10/2012   PAT (paroxysmal atrial tachycardia) (HCC)    a. Dx 03/2019 - improved w/ beta blocker; b. 03/2019 Echo: EF 55-60%. Impaired relaxation. RVSP 48.8mmHg. Mild to mod MR/TR.   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   Ankle tightness 01/04/2019   Neck pain 12/13/2018   Ear pain 12/13/2018   Subconjunctival hemorrhage of left eye 02/13/2018   Positional right arm tingling while sleeping 10/01/2017   Anxiety 12/14/2016   Nocturia 12/14/2016   Varicose veins of both lower extremities 09/11/2016   Rash and nonspecific  skin eruption 07/13/2016   Dry eyes 06/11/2016   Fall 03/11/2016   Tremor 01/16/2016   Seasonal allergic rhinitis 02/28/2015   Diarrhea 01/15/2015   Cold intolerance 11/26/2014   Osteoarthritis of left knee 08/31/2014   Gait instability 08/31/2014   Memory loss 08/31/2014   Seborrheic keratoses 12/28/2012   Osteopenia 09/22/2012   History of breast cancer 06/02/2012   Depression 01/08/2012   History of pulmonary embolus (PE) 12/03/2011   Insomnia 05/13/2011   Hypertension 05/13/2011   Home Medication(s) Prior to Admission medications   Medication Sig Start Date End Date Taking? Authorizing Provider  albuterol  (PROVENTIL  HFA;VENTOLIN  HFA) 108 (90 Base) MCG/ACT inhaler Inhale 2 puffs into the lungs every 6 (six) hours as needed for wheezing or shortness of breath. 09/01/18   Osker Tinnie HERO, FNP  ALPRAZolam  (XANAX ) 0.5 MG tablet Take 0.5 mg by mouth at bedtime as needed for anxiety.    [provider]  apixaban  (ELIQUIS ) 2.5 MG TABS tablet Take 2.5 mg by mouth 2 (two) times daily.    [provider]  baclofen  (LIORESAL ) 10 MG tablet Take 10 mg by mouth 3 (three) times daily.    [provider]  cephALEXin  (KEFLEX ) 250 MG capsule Take 1 capsule (250 mg total) by mouth 3 (three) times daily. 04/26/22   Sung, Jade J, MD  Cholecalciferol (VITAMIN D  PO) Take 2,000 mg by mouth daily.    [provider]  cholestyramine  light (PREVALITE ) 4  g packet  05/14/20   [provider]  denosumab  (PROLIA ) 60 MG/ML SOSY injection Prolia  60 mg/mL subcutaneous syringe  Inject 1 mL by subcutaneous route.    [provider]  diphenoxylate -atropine  (LOMOTIL ) 2.5-0.025 MG tablet Take 1 tablet by mouth 4 (four) times daily as needed for diarrhea or loose stools. 11/28/19   Maribeth Camellia MATSU, MD  fluticasone  (FLONASE ) 50 MCG/ACT nasal spray Place 1 spray into both nostrils daily.    [provider]  loperamide (IMODIUM) 2 MG capsule Take 2 mg by mouth as  needed for diarrhea or loose stools.    [provider]  losartan  (COZAAR ) 25 MG tablet TAKE 1 TABLET DAILY 09/30/20   Maribeth Camellia MATSU, MD  metoprolol  tartrate (LOPRESSOR ) 50 MG tablet TAKE 1 TABLET TWICE A DAY 07/07/21   Maribeth Camellia MATSU, MD  mirtazapine  (REMERON ) 7.5 MG tablet Take 1 tablet (7.5 mg total) by mouth at bedtime. 04/14/21   Maribeth Camellia MATSU, MD  montelukast  (SINGULAIR ) 10 MG tablet  07/22/19   [provider]  triamcinolone  cream (KENALOG ) 0.1 % Apply 1 application topically 2 (two) times daily. For up to 7 days. Do not apply to the face. 04/19/19   Maribeth Camellia MATSU, MD  Vibegron  (GEMTESA ) 75 MG TABS Take 75 mg by mouth daily. 07/27/21   Maribeth Camellia MATSU, MD  XIIDRA  5 % SOLN Place 1-2 drops into both eyes daily.  12/06/17   [provider]                                                                                                                                    Past Surgical History Past Surgical History:  Procedure Laterality Date   BLADDER SURGERY     BREAST SURGERY Left 2013   mastectomy   CHOLECYSTECTOMY     HERNIA REPAIR     MASTECTOMY Left 2013   BREAST CA   REPLACEMENT TOTAL KNEE     right   TONSILLECTOMY     Family History Family History  Problem Relation Age of Onset   COPD Mother    Stroke Father    Breast cancer Maternal Aunt     Social History Social History   Tobacco Use   Smoking status: Former    Current packs/day: 0.00    Types: Cigarettes    Quit date: 05/13/1999    Years since quitting: 24.7   Smokeless tobacco: Never  Vaping Use   Vaping status: Never Used  Substance Use Topics   Alcohol use: No   Drug use: No   Allergies Erythromycin and Sulfa drugs cross reactors  Review of Systems Review of Systems  Skin:  Positive for wound.  Neurological:  Positive for headaches.    Physical Exam Vital Signs  I have reviewed the triage vital signs BP 130/74   Pulse 74   Temp (!) 96.1 F (35.6 C)  (Axillary)  Resp 19   Ht 5' (1.524 m)   Wt 58.3 kg   SpO2 100%   BMI 25.10 kg/m   Physical Exam Vitals and nursing note reviewed.  Constitutional:      General: She is not in acute distress.    Appearance: She is well-developed.  HENT:     Head: Normocephalic.     Comments: Right occipital scalp laceration Eyes:     Conjunctiva/sclera: Conjunctivae normal.  Cardiovascular:     Rate and Rhythm: Normal rate and regular rhythm.     Heart sounds: No murmur heard. Pulmonary:     Effort: Pulmonary effort is normal. No respiratory distress.     Breath sounds: Normal breath sounds.  Abdominal:     Palpations: Abdomen is soft.     Tenderness: There is no abdominal tenderness.  Musculoskeletal:        General: No swelling.     Cervical back: Neck supple.  Skin:    General: Skin is warm and dry.     Capillary Refill: Capillary refill takes less than 2 seconds.  Neurological:     Mental Status: She is alert.  Psychiatric:        Mood and Affect: Mood normal.     ED Results and Treatments Labs (all labs ordered are listed, but only abnormal results are displayed) Labs Reviewed  COMPREHENSIVE METABOLIC PANEL WITH GFR - Abnormal; Notable for the following components:      Result Value   BUN 24 (*)    Creatinine, Ser 1.07 (*)    Total Protein 6.4 (*)    Albumin 3.0 (*)    GFR, Estimated 48 (*)    All other components within normal limits  CBC WITH DIFFERENTIAL/PLATELET - Abnormal; Notable for the following components:   RBC 3.79 (*)    Hemoglobin 9.6 (*)    HCT 30.8 (*)    MCH 25.3 (*)    RDW 17.2 (*)    All other components within normal limits                                                                                                                          Radiology CT HEAD WO CONTRAST ( ) Result Date: 01/30/2024 CLINICAL DATA:  89 year old female status post fall. EXAM: CT HEAD WITHOUT CONTRAST TECHNIQUE: Contiguous axial images were obtained from the base of  the skull through the vertex without intravenous contrast. RADIATION DOSE REDUCTION: This exam was performed according to the departmental dose-optimization program which includes automated exposure control, adjustment of the mA and/or kV according to patient size and/or use of iterative reconstruction technique. COMPARISON:  Brain MRI 06/01/2019.  Head CT 01/19/2024. FINDINGS: Brain: Stable cerebral volume. No midline shift, ventriculomegaly, mass effect, evidence of mass lesion, intracranial hemorrhage or evidence of cortically based acute infarction. Confluent cerebral white matter hypodensity, asymmetric moderate heterogeneity in the deep gray nuclei-especially the right thalamus. Stable gray-white matter differentiation throughout the brain. Vascular: No suspicious intracranial vascular  hyperdensity. Calcified atherosclerosis at the skull base. Skull: Stable and intact. Sinuses/Orbits: Visualized paranasal sinuses and mastoids are stable and well aerated. Other: Anterior vertex scalp soft tissue swelling is new from last month compatible with hematoma and/or contusion (series 3, image 10). No scalp soft tissue gas. Underlying calvarium appears intact. Stable and negative orbits soft tissues. IMPRESSION: 1. Anterior vertex scalp soft tissue injury. No skull fracture. 2. No acute intracranial abnormality. Stable non contrast CT appearance of chronic small vessel disease. Electronically Signed   By: VEAR Hurst M.D.   On: 01/30/2024 05:57   CT CERVICAL SPINE WO CONTRAST Result Date: 01/30/2024 CLINICAL DATA:  89 year old female status post fall. EXAM: CT CERVICAL SPINE WITHOUT CONTRAST TECHNIQUE: Multidetector CT imaging of the cervical spine was performed without intravenous contrast. Multiplanar CT image reconstructions were also generated. RADIATION DOSE REDUCTION: This exam was performed according to the departmental dose-optimization program which includes automated exposure control, adjustment of the mA and/or  kV according to patient size and/or use of iterative reconstruction technique. COMPARISON:  Head CT today reported separately. Cervical spine CT 01/19/2024. FINDINGS: Alignment: Abnormal but stable from last month. Chronic straightening and mild reversal of cervical lordosis with multilevel spondylolisthesis. 2-3 mm of anterolisthesis C3-C4, C4-C5, C7-T1. Maintained posterior element alignment. Skull base and vertebrae: Visualized skull base is intact. No atlanto-occipital dissociation. C1 and C2 appear chronically severely degenerated, but stable and aligned. No acute osseous abnormality identified. Soft tissues and spinal canal: No prevertebral fluid or swelling. No visible canal hematoma. Calcified cervical carotid atherosclerosis. Disc levels: Chronic severe cervical spine degeneration. Degenerative ankylosis C5-C6. Multilevel spondylolisthesis superimposed. Bulky and severe ligamentous hypertrophy at C1-C2 and C2-C3. Subsequent moderate and possibly severe C2 level spinal stenosis (series 3, image 37) which does appear progressed from a 2023 cervical spine CT. Upper chest: Chronic upper thoracic spine degeneration to a lesser extent. Degenerative appearing ankylosis at T3-T4. Negative lung apices aside from emphysema. IMPRESSION: 1. No acute traumatic injury identified in the cervical spine. 2. Chronic very severe cervical spine degeneration. Evidence of chronic progressive C2 level spinal stenosis in large part due to bulky anterior and posterior ligamentous hypertrophy. Associated spinal cord mass effect. Degenerative ankylosis in the lower cervical spine, visible upper thoracic spine. 3.  Emphysema (ICD10-J43.9). Electronically Signed   By: VEAR Hurst M.D.   On: 01/30/2024 05:51   DG Pelvis Portable Result Date: 01/30/2024 CLINICAL DATA:  89 year old female status post fall. EXAM: PORTABLE PELVIS 1-2 VIEWS COMPARISON:  CT Abdomen and Pelvis 09/14/2017. Pelvis radiograph 12/20/2023. FINDINGS: Portable AP  supine view at 0526 hours. Stable bone mineralization. Femoral heads remain normally located. Pelvis appears stable and intact, chronic small bone anchors at the pubic symphysis. Nonobstructed bowel-gas pattern. Grossly intact proximal femurs. IMPRESSION: No acute fracture or dislocation identified about the pelvis. If there is lateralizing hip pain recommend dedicated hip series. Electronically Signed   By: VEAR Hurst M.D.   On: 01/30/2024 05:33   DG Chest Portable 1 View Result Date: 01/30/2024 CLINICAL DATA:  89 year old female status post fall. EXAM: PORTABLE CHEST 1 VIEW COMPARISON:  Portable chest 10/27/2022 and earlier. FINDINGS: Portable AP semi upright view at 0525 hours. Mildly rotated to the right. Chronic tortuosity and calcified atherosclerosis of the thoracic aorta. Chronic cardiomegaly. Stable cardiac size and mediastinal contours. Visualized tracheal air column is within normal limits. Allowing for portable technique the lungs are clear. No pneumothorax or pleural effusion. Scoliosis and degeneration in the visible spine. No acute osseous abnormality identified. Paucity of  bowel gas. Faintly visible chronic IVC filter in the upper abdomen. IMPRESSION: 1. No acute cardiopulmonary abnormality. 2. Chronic cardiomegaly and Aortic Atherosclerosis (ICD10-I70.0). Electronically Signed   By: VEAR Hurst M.D.   On: 01/30/2024 05:32    Pertinent labs & imaging results that were available during my care of the patient were reviewed by me and considered in my medical decision making (see MDM for details).  Medications Ordered in ED Medications - No data to display                                                                                                                                   Procedures .Critical Care  Performed by: Albertina Dixon, MD Authorized by: Albertina Dixon, MD   Critical care provider statement:    Critical care time (minutes):  30   Critical care was necessary to treat or  prevent imminent or life-threatening deterioration of the following conditions:  Trauma   Critical care was time spent personally by me on the following activities:  Development of treatment plan with patient or surrogate, discussions with consultants, evaluation of patient's response to treatment, examination of patient, ordering and review of laboratory studies, ordering and review of radiographic studies, ordering and performing treatments and interventions, pulse oximetry, re-evaluation of patient's condition and review of old charts .Laceration Repair  Date/Time: 01/30/2024 6:29 AM  Performed by: Albertina Dixon, MD Authorized by: Albertina Dixon, MD   Laceration details:    Location:  Scalp   Length (cm):  0.5 Treatment:    Amount of cleaning:  Standard Skin repair:    Repair method:  Tissue adhesive Approximation:    Approximation:  Close Repair type:    Repair type:  Simple Post-procedure details:    Dressing:  Open (no dressing)   Procedure completion:  Tolerated well, no immediate complications   (including critical care time)  Medical Decision Making / ED Course   This patient presents to the ED for concern of fall on thinners, this involves an extensive number of treatment options, and is a complaint that carries with it a high risk of complications and morbidity.  The differential diagnosis includes fracture, contusion, hematoma, ligamentous injury, closed head injury, ICH, laceration, intrathoracic injury, intra-abdominal injury  MDM: Patient seen emergency room for evaluation of a fall on blood thinners.  Patient arrives as a level 2 trauma and primary survey is unremarkable.  Secondary survey with a 0.5 cm right occipital scalp laceration.  Trauma imaging including CT head, C-spine, chest x-ray pelvis x-ray reassuringly unremarkable.  Laboratory valuation with a hemoglobin of 9.6, creatinine 1.07, albumin 3.0 but is otherwise unremarkable.  Laceration repaired with  tissue adhesive, opted against staple placement given very small size of laceration.  At this time she does not meet inpatient criteria for admission will be discharged with outpatient follow-up.  Return precautions given of which she voiced understand.   Additional history obtained:  -  External records from outside source obtained and reviewed including: Chart review including previous notes, labs, imaging, consultation notes   Lab Tests: -I ordered, reviewed, and interpreted labs.   The pertinent results include:   Labs Reviewed  COMPREHENSIVE METABOLIC PANEL WITH GFR - Abnormal; Notable for the following components:      Result Value   BUN 24 (*)    Creatinine, Ser 1.07 (*)    Total Protein 6.4 (*)    Albumin 3.0 (*)    GFR, Estimated 48 (*)    All other components within normal limits  CBC WITH DIFFERENTIAL/PLATELET - Abnormal; Notable for the following components:   RBC 3.79 (*)    Hemoglobin 9.6 (*)    HCT 30.8 (*)    MCH 25.3 (*)    RDW 17.2 (*)    All other components within normal limits       Imaging Studies ordered: I ordered imaging studies including CT head, C-spine, chest x-ray, pelvis x-ray I independently visualized and interpreted imaging. I agree with the radiologist interpretation   Medicines ordered and prescription drug management: No orders of the defined types were placed in this encounter.   -I have reviewed the patients home medicines and have made adjustments as needed  Critical interventions none  Cardiac Monitoring: The patient was maintained on a cardiac monitor.  I personally viewed and interpreted the cardiac monitored which showed an underlying rhythm of: NSR  Social Determinants of Health:  Factors impacting patients care include: Lives in skilled nursing facility   Reevaluation: After the interventions noted above, I reevaluated the patient and found that they have :improved  Co morbidities that complicate the patient  evaluation  Past Medical History:  Diagnosis Date   A-fib (HCC)    Allergy    Breast cancer (HCC) 07/22/2012   T1c, Nx carcinoma left breast, ER 90%, PR 90%, HER-2/neu not over expressed.   Hypertension    Insomnia    Osteoporosis    Prolia  10/2012   PAT (paroxysmal atrial tachycardia) (HCC)    a. Dx 03/2019 - improved w/ beta blocker; b. 03/2019 Echo: EF 55-60%. Impaired relaxation. RVSP 48.13mmHg. Mild to mod MR/TR.   Pulmonary embolus (HCC) 2009   S/P IVC filter 2009      Dispostion: I considered admission for this patient, but at this time she does not meet inpatient criteria for admission will be discharged with outpatient follow-up     Final Clinical Impression(s) / ED Diagnoses Final diagnoses:  Fall, initial encounter  Laceration of scalp, initial encounter     @PCDICTATION @    Albertina Dixon, MD 01/30/24 [phone]

## 2024-01-30 NOTE — ED Triage Notes (Addendum)
 PT BIB by [facility] from [facility] after having an unwitnessed fall. Pt was said to have hit her head on the dresser and then the floor. PT is on Eliquis . Pt has no significant bleeding at this time. PT had no LOC to her and facility's knowledge. PT is at her baseline mentation per facility with episodes of confusion.

## 2024-02-08 ENCOUNTER — Encounter: Admitting: Physician Assistant

## 2024-02-08 DIAGNOSIS — I87332 Chronic venous hypertension (idiopathic) with ulcer and inflammation of left lower extremity: Secondary | ICD-10-CM | POA: Diagnosis not present

## 2024-02-22 ENCOUNTER — Encounter: Admitting: Physician Assistant

## 2024-02-22 DIAGNOSIS — I87332 Chronic venous hypertension (idiopathic) with ulcer and inflammation of left lower extremity: Secondary | ICD-10-CM | POA: Diagnosis not present

## 2024-06-26 DEATH — deceased
